# Patient Record
Sex: Male | Born: 1945 | Race: White | Hispanic: No | Marital: Married | Smoking: Current every day smoker
Health system: Southern US, Community
[De-identification: ages and names within clinical notes are randomized; demographics above are authoritative.]

## PROBLEM LIST (undated history)

## (undated) DIAGNOSIS — R4589 Other symptoms and signs involving emotional state: Secondary | ICD-10-CM

## (undated) DIAGNOSIS — M1990 Unspecified osteoarthritis, unspecified site: Secondary | ICD-10-CM

## (undated) DIAGNOSIS — N400 Enlarged prostate without lower urinary tract symptoms: Secondary | ICD-10-CM

## (undated) DIAGNOSIS — J449 Chronic obstructive pulmonary disease, unspecified: Secondary | ICD-10-CM

## (undated) DIAGNOSIS — D369 Benign neoplasm, unspecified site: Secondary | ICD-10-CM

## (undated) DIAGNOSIS — K5792 Diverticulitis of intestine, part unspecified, without perforation or abscess without bleeding: Secondary | ICD-10-CM

## (undated) DIAGNOSIS — J189 Pneumonia, unspecified organism: Secondary | ICD-10-CM

## (undated) DIAGNOSIS — A419 Sepsis, unspecified organism: Secondary | ICD-10-CM

## (undated) DIAGNOSIS — R0600 Dyspnea, unspecified: Secondary | ICD-10-CM

## (undated) DIAGNOSIS — R51 Headache: Secondary | ICD-10-CM

## (undated) DIAGNOSIS — I739 Peripheral vascular disease, unspecified: Secondary | ICD-10-CM

## (undated) DIAGNOSIS — G510 Bell's palsy: Secondary | ICD-10-CM

## (undated) DIAGNOSIS — C801 Malignant (primary) neoplasm, unspecified: Secondary | ICD-10-CM

## (undated) DIAGNOSIS — I10 Essential (primary) hypertension: Secondary | ICD-10-CM

## (undated) DIAGNOSIS — M25559 Pain in unspecified hip: Secondary | ICD-10-CM

## (undated) HISTORY — DX: Peripheral vascular disease, unspecified: I73.9

## (undated) HISTORY — PX: VASECTOMY: SHX75

## (undated) HISTORY — PX: SPINE SURGERY: SHX786

## (undated) HISTORY — DX: Unspecified osteoarthritis, unspecified site: M19.90

## (undated) HISTORY — DX: Malignant (primary) neoplasm, unspecified: C80.1

## (undated) HISTORY — PX: ROTATOR CUFF REPAIR: SHX139

## (undated) HISTORY — DX: Benign neoplasm, unspecified site: D36.9

---

## 1998-03-18 ENCOUNTER — Inpatient Hospital Stay (HOSPITAL_COMMUNITY): Admission: RE | Admit: 1998-03-18 | Discharge: 1998-03-20 | Payer: Self-pay | Admitting: Neurosurgery

## 1998-09-27 ENCOUNTER — Emergency Department (HOSPITAL_COMMUNITY): Admission: EM | Admit: 1998-09-27 | Discharge: 1998-09-27 | Payer: Self-pay | Admitting: Emergency Medicine

## 1998-10-04 ENCOUNTER — Ambulatory Visit (HOSPITAL_COMMUNITY): Admission: RE | Admit: 1998-10-04 | Discharge: 1998-10-04 | Payer: Self-pay | Admitting: Neurosurgery

## 1998-11-04 ENCOUNTER — Inpatient Hospital Stay (HOSPITAL_COMMUNITY): Admission: RE | Admit: 1998-11-04 | Discharge: 1998-11-05 | Payer: Self-pay | Admitting: Neurosurgery

## 1999-01-19 ENCOUNTER — Ambulatory Visit (HOSPITAL_COMMUNITY): Admission: RE | Admit: 1999-01-19 | Discharge: 1999-01-19 | Payer: Self-pay | Admitting: Neurosurgery

## 1999-04-01 ENCOUNTER — Ambulatory Visit (HOSPITAL_BASED_OUTPATIENT_CLINIC_OR_DEPARTMENT_OTHER): Admission: RE | Admit: 1999-04-01 | Discharge: 1999-04-01 | Payer: Self-pay | Admitting: Orthopedic Surgery

## 1999-05-27 ENCOUNTER — Encounter: Admission: RE | Admit: 1999-05-27 | Discharge: 1999-05-27 | Payer: Self-pay | Admitting: Neurosurgery

## 1999-07-29 ENCOUNTER — Encounter: Admission: RE | Admit: 1999-07-29 | Discharge: 1999-07-29 | Payer: Self-pay | Admitting: Neurosurgery

## 1999-11-23 ENCOUNTER — Ambulatory Visit (HOSPITAL_COMMUNITY): Admission: RE | Admit: 1999-11-23 | Discharge: 1999-11-23 | Payer: Self-pay | Admitting: Neurosurgery

## 1999-12-24 ENCOUNTER — Inpatient Hospital Stay (HOSPITAL_COMMUNITY): Admission: RE | Admit: 1999-12-24 | Discharge: 1999-12-30 | Payer: Self-pay | Admitting: Neurosurgery

## 2000-02-03 ENCOUNTER — Encounter: Admission: RE | Admit: 2000-02-03 | Discharge: 2000-02-03 | Payer: Self-pay | Admitting: Neurosurgery

## 2000-02-06 ENCOUNTER — Ambulatory Visit: Admission: RE | Admit: 2000-02-06 | Discharge: 2000-02-06 | Payer: Self-pay | Admitting: Neurosurgery

## 2000-04-07 ENCOUNTER — Encounter: Admission: RE | Admit: 2000-04-07 | Discharge: 2000-04-07 | Payer: Self-pay | Admitting: Neurosurgery

## 2000-07-06 ENCOUNTER — Encounter: Admission: RE | Admit: 2000-07-06 | Discharge: 2000-07-06 | Payer: Self-pay | Admitting: Neurosurgery

## 2000-07-30 ENCOUNTER — Ambulatory Visit (HOSPITAL_COMMUNITY): Admission: RE | Admit: 2000-07-30 | Discharge: 2000-07-30 | Payer: Self-pay | Admitting: Internal Medicine

## 2001-01-11 ENCOUNTER — Encounter: Admission: RE | Admit: 2001-01-11 | Discharge: 2001-01-11 | Payer: Self-pay | Admitting: Neurosurgery

## 2001-06-23 ENCOUNTER — Ambulatory Visit (HOSPITAL_COMMUNITY): Admission: RE | Admit: 2001-06-23 | Discharge: 2001-06-23 | Payer: Self-pay | Admitting: Neurosurgery

## 2001-10-05 ENCOUNTER — Encounter: Payer: Self-pay | Admitting: Urology

## 2001-10-05 ENCOUNTER — Ambulatory Visit (HOSPITAL_COMMUNITY): Admission: RE | Admit: 2001-10-05 | Discharge: 2001-10-05 | Payer: Self-pay | Admitting: Urology

## 2001-11-03 ENCOUNTER — Ambulatory Visit (HOSPITAL_COMMUNITY): Admission: RE | Admit: 2001-11-03 | Discharge: 2001-11-03 | Payer: Self-pay | Admitting: Urology

## 2001-11-03 ENCOUNTER — Encounter: Payer: Self-pay | Admitting: Urology

## 2002-01-26 ENCOUNTER — Observation Stay (HOSPITAL_COMMUNITY): Admission: RE | Admit: 2002-01-26 | Discharge: 2002-01-27 | Payer: Self-pay | Admitting: Neurosurgery

## 2002-03-27 ENCOUNTER — Ambulatory Visit (HOSPITAL_BASED_OUTPATIENT_CLINIC_OR_DEPARTMENT_OTHER): Admission: RE | Admit: 2002-03-27 | Discharge: 2002-03-27 | Payer: Self-pay | Admitting: Orthopedic Surgery

## 2002-03-30 ENCOUNTER — Encounter (HOSPITAL_COMMUNITY): Admission: RE | Admit: 2002-03-30 | Discharge: 2002-04-29 | Payer: Self-pay | Admitting: Orthopedic Surgery

## 2002-05-08 ENCOUNTER — Encounter (HOSPITAL_COMMUNITY): Admission: RE | Admit: 2002-05-08 | Discharge: 2002-06-07 | Payer: Self-pay | Admitting: Orthopedic Surgery

## 2002-09-24 ENCOUNTER — Ambulatory Visit (HOSPITAL_COMMUNITY): Admission: RE | Admit: 2002-09-24 | Discharge: 2002-09-24 | Payer: Self-pay | Admitting: Neurosurgery

## 2003-02-02 ENCOUNTER — Encounter: Payer: Self-pay | Admitting: Family Medicine

## 2003-02-02 ENCOUNTER — Ambulatory Visit (HOSPITAL_COMMUNITY): Admission: RE | Admit: 2003-02-02 | Discharge: 2003-02-02 | Payer: Self-pay | Admitting: Family Medicine

## 2003-03-22 ENCOUNTER — Ambulatory Visit (HOSPITAL_COMMUNITY): Admission: RE | Admit: 2003-03-22 | Discharge: 2003-03-22 | Payer: Self-pay | Admitting: Neurosurgery

## 2003-06-26 ENCOUNTER — Ambulatory Visit (HOSPITAL_COMMUNITY): Admission: RE | Admit: 2003-06-26 | Discharge: 2003-06-26 | Payer: Self-pay | Admitting: Neurosurgery

## 2003-08-03 ENCOUNTER — Ambulatory Visit (HOSPITAL_COMMUNITY): Admission: RE | Admit: 2003-08-03 | Discharge: 2003-08-03 | Payer: Self-pay | Admitting: Neurosurgery

## 2003-09-21 ENCOUNTER — Encounter
Admission: RE | Admit: 2003-09-21 | Discharge: 2003-12-20 | Payer: Self-pay | Admitting: Physical Medicine & Rehabilitation

## 2003-09-24 ENCOUNTER — Ambulatory Visit (HOSPITAL_COMMUNITY): Admission: RE | Admit: 2003-09-24 | Discharge: 2003-09-24 | Payer: Self-pay | Admitting: Urology

## 2004-04-20 HISTORY — PX: ILIAC ARTERY STENT: SHX1786

## 2004-08-21 ENCOUNTER — Ambulatory Visit: Payer: Self-pay | Admitting: Orthopedic Surgery

## 2004-08-22 ENCOUNTER — Ambulatory Visit (HOSPITAL_COMMUNITY): Admission: RE | Admit: 2004-08-22 | Discharge: 2004-08-22 | Payer: Self-pay | Admitting: Orthopedic Surgery

## 2004-10-01 ENCOUNTER — Ambulatory Visit (HOSPITAL_COMMUNITY): Admission: RE | Admit: 2004-10-01 | Discharge: 2004-10-01 | Payer: Self-pay | Admitting: Vascular Surgery

## 2004-12-17 ENCOUNTER — Ambulatory Visit (HOSPITAL_COMMUNITY): Admission: RE | Admit: 2004-12-17 | Discharge: 2004-12-17 | Payer: Self-pay | Admitting: General Surgery

## 2005-02-12 ENCOUNTER — Inpatient Hospital Stay (HOSPITAL_COMMUNITY): Admission: RE | Admit: 2005-02-12 | Discharge: 2005-02-15 | Payer: Self-pay | Admitting: Neurosurgery

## 2005-07-30 ENCOUNTER — Ambulatory Visit (HOSPITAL_COMMUNITY): Admission: RE | Admit: 2005-07-30 | Discharge: 2005-07-30 | Payer: Self-pay | Admitting: Internal Medicine

## 2005-07-30 ENCOUNTER — Ambulatory Visit: Payer: Self-pay | Admitting: Internal Medicine

## 2005-07-30 HISTORY — PX: COLONOSCOPY: SHX174

## 2006-05-12 ENCOUNTER — Emergency Department (HOSPITAL_COMMUNITY): Admission: EM | Admit: 2006-05-12 | Discharge: 2006-05-12 | Payer: Self-pay | Admitting: Emergency Medicine

## 2006-08-24 ENCOUNTER — Ambulatory Visit (HOSPITAL_COMMUNITY): Admission: RE | Admit: 2006-08-24 | Discharge: 2006-08-24 | Payer: Self-pay | Admitting: Family Medicine

## 2006-12-28 ENCOUNTER — Ambulatory Visit (HOSPITAL_COMMUNITY): Admission: RE | Admit: 2006-12-28 | Discharge: 2006-12-28 | Payer: Self-pay | Admitting: Neurosurgery

## 2007-04-18 ENCOUNTER — Inpatient Hospital Stay (HOSPITAL_COMMUNITY): Admission: RE | Admit: 2007-04-18 | Discharge: 2007-04-21 | Payer: Self-pay | Admitting: Neurosurgery

## 2007-05-18 ENCOUNTER — Ambulatory Visit (HOSPITAL_COMMUNITY): Admission: RE | Admit: 2007-05-18 | Discharge: 2007-05-18 | Payer: Self-pay | Admitting: Family Medicine

## 2007-06-02 ENCOUNTER — Ambulatory Visit (HOSPITAL_COMMUNITY): Admission: RE | Admit: 2007-06-02 | Discharge: 2007-06-02 | Payer: Self-pay | Admitting: Family Medicine

## 2007-06-14 ENCOUNTER — Encounter (INDEPENDENT_AMBULATORY_CARE_PROVIDER_SITE_OTHER): Payer: Self-pay | Admitting: Diagnostic Radiology

## 2007-06-14 ENCOUNTER — Ambulatory Visit (HOSPITAL_COMMUNITY): Admission: RE | Admit: 2007-06-14 | Discharge: 2007-06-14 | Payer: Self-pay | Admitting: Family Medicine

## 2007-11-09 ENCOUNTER — Ambulatory Visit: Payer: Self-pay | Admitting: Vascular Surgery

## 2007-11-21 ENCOUNTER — Ambulatory Visit (HOSPITAL_COMMUNITY): Admission: RE | Admit: 2007-11-21 | Discharge: 2007-11-21 | Payer: Self-pay | Admitting: Neurosurgery

## 2007-12-15 ENCOUNTER — Encounter: Admission: RE | Admit: 2007-12-15 | Discharge: 2007-12-15 | Payer: Self-pay | Admitting: Orthopedic Surgery

## 2008-04-09 IMAGING — CT CT ANGIO CHEST
1 of 2 series · 19 of 32 positions shown · IV contrast (Omnipaque 300)
Comparison: Chest x-ray 05/12/06.

CLINICAL DATA: Back surgery March 2007 with elevated D-dimer.
CT ANGIOGRAPHY OF CHEST:
TECHNIQUE: Multidetector CT imaging of the chest was performed during bolus injection of intravenous contrast.  Multiplanar CT angiographic image reconstructions were generated to evaluate the vascular anatomy.
Contrast:  80 cc Omnipaque 300.

[Series 12: thin pacs · axial · 0.71mm/px · z∈[-306,-44]mm · 19 of 293 slices shown]
[im 15/293  lung]
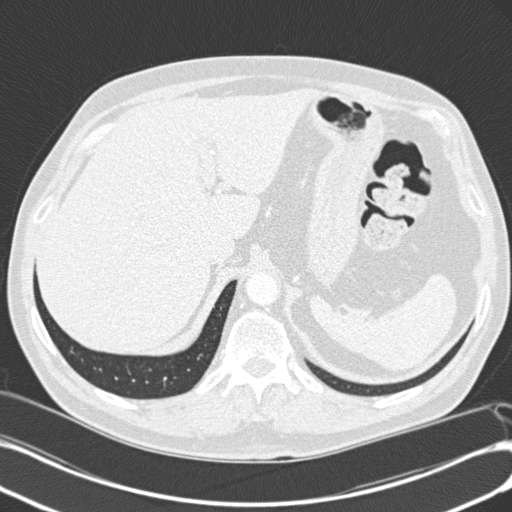
[im 30/293  mediastinal]
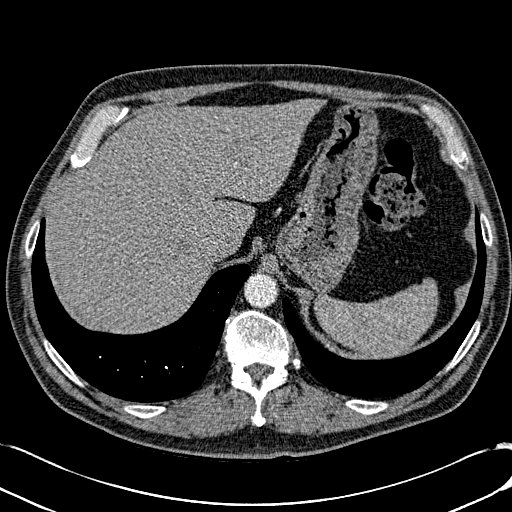
[im 44/293  lung]
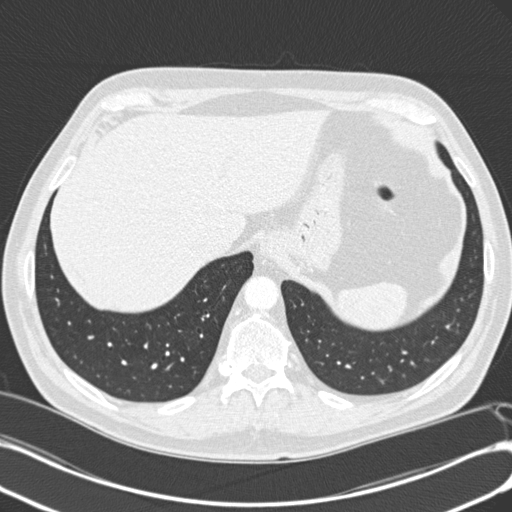
[im 74/293  mediastinal]
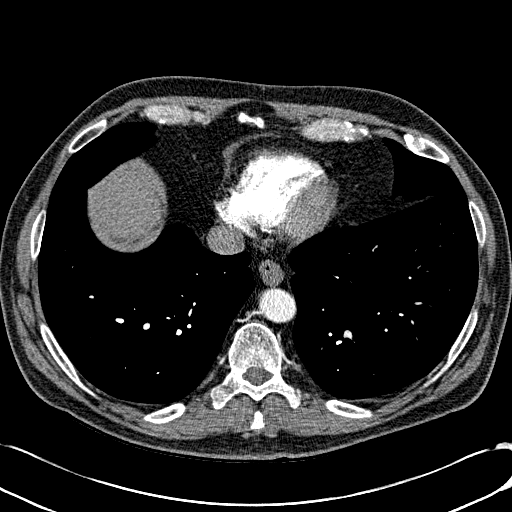
[im 88/293  lung]
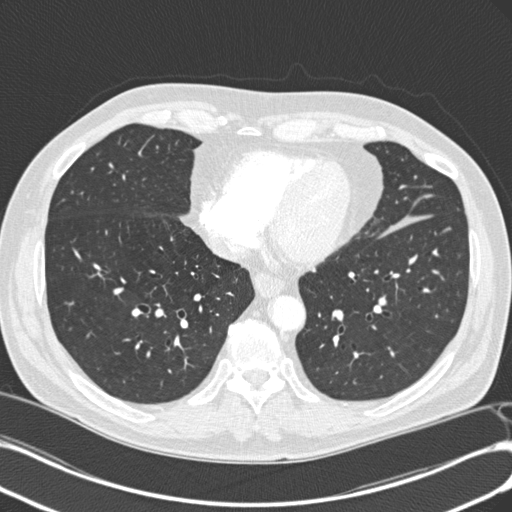
[im 98/293  mediastinal]
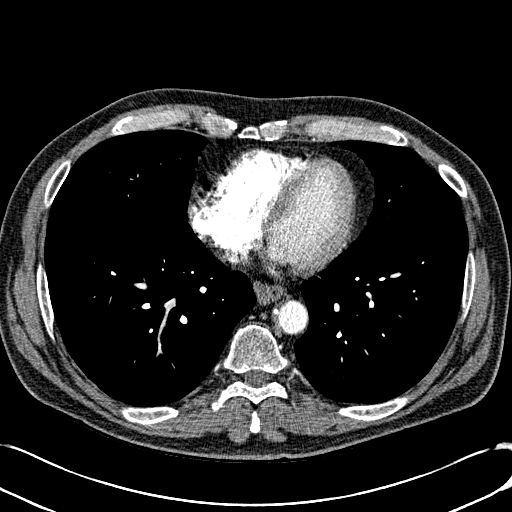
[im 103/293  lung]
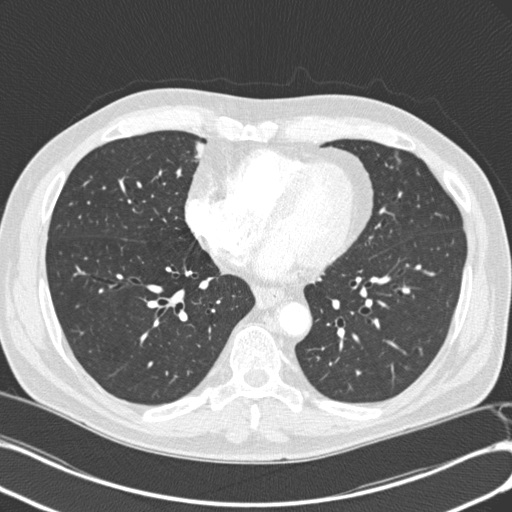
[im 117/293  mediastinal]
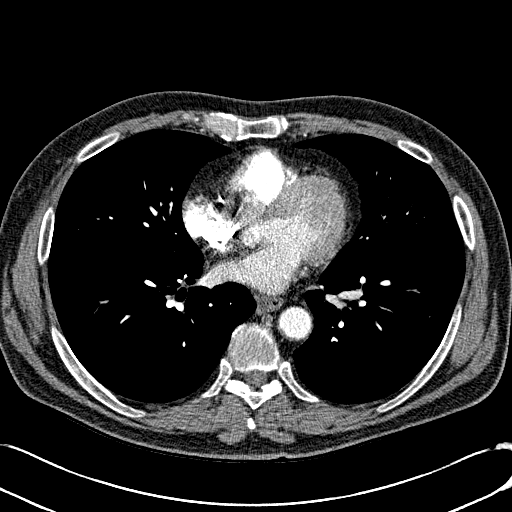
[im 132/293  lung]
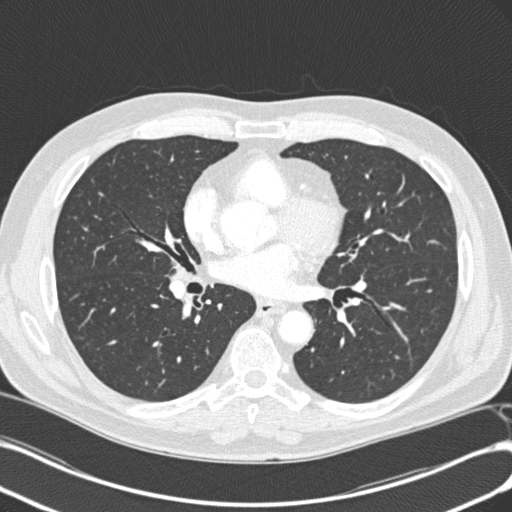
[im 147/293  mediastinal]
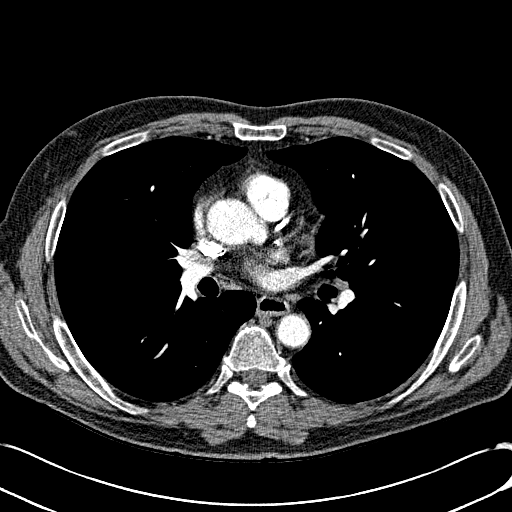
[im 161/293  lung]
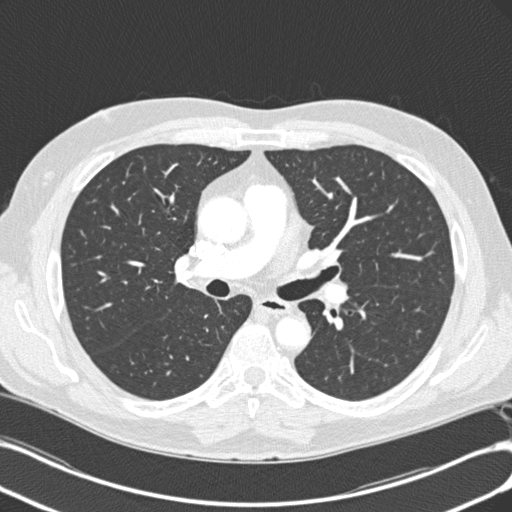
[im 176/293  mediastinal]
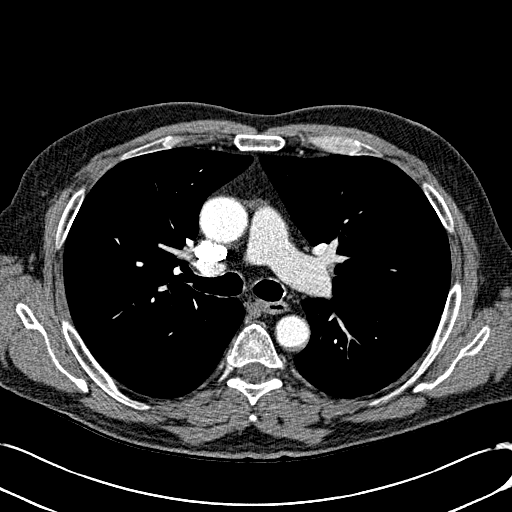
[im 190/293  lung]
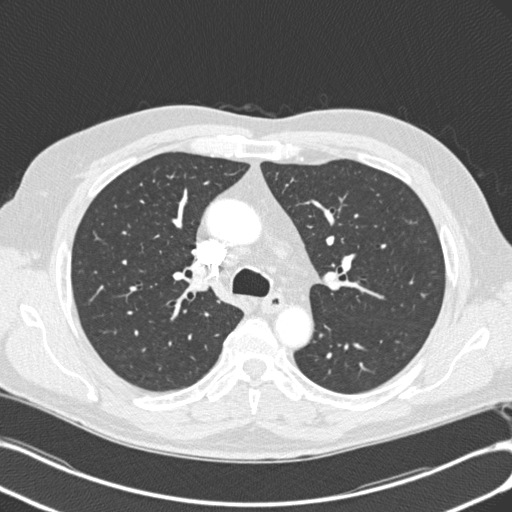
[im 195/293  mediastinal]
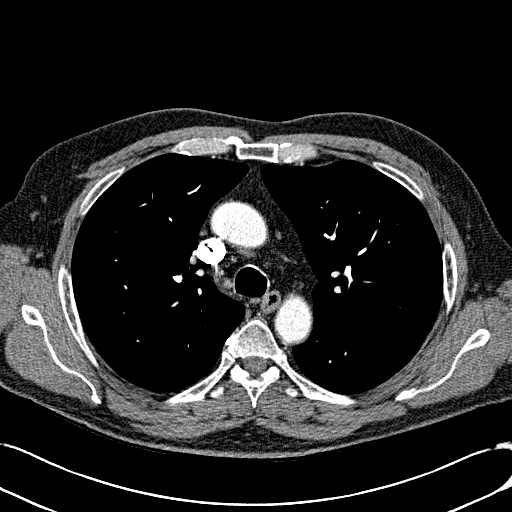
[im 205/293  lung]
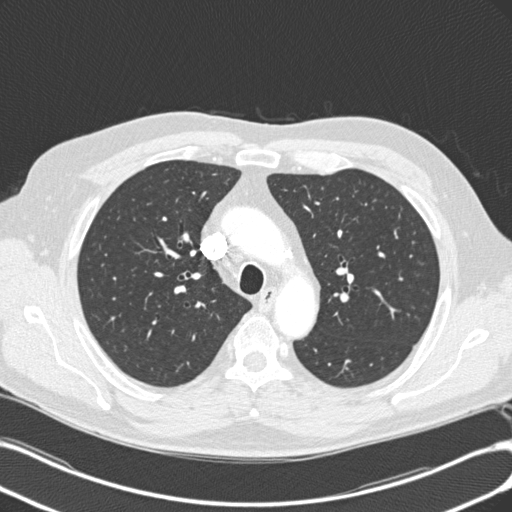
[im 220/293  mediastinal]
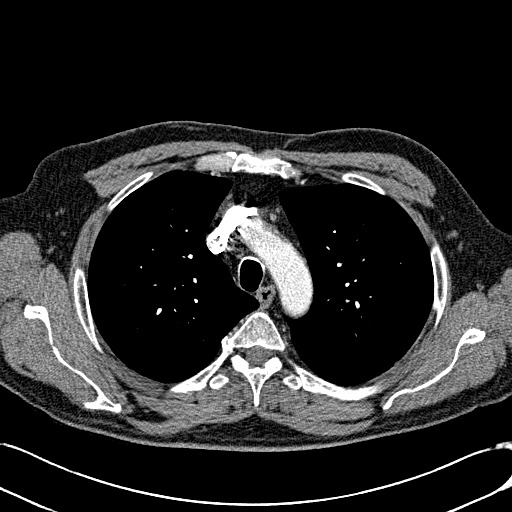
[im 249/293  lung]
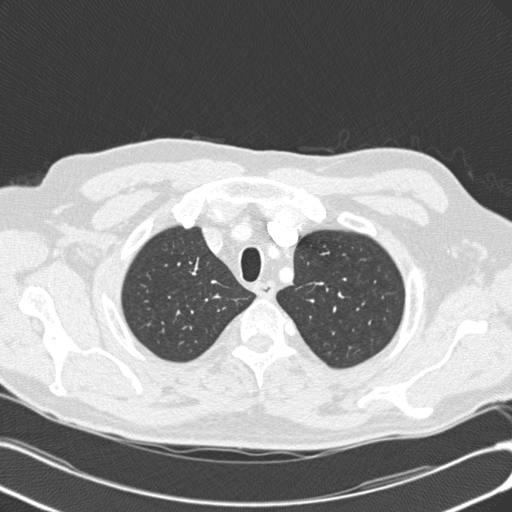
[im 263/293  mediastinal]
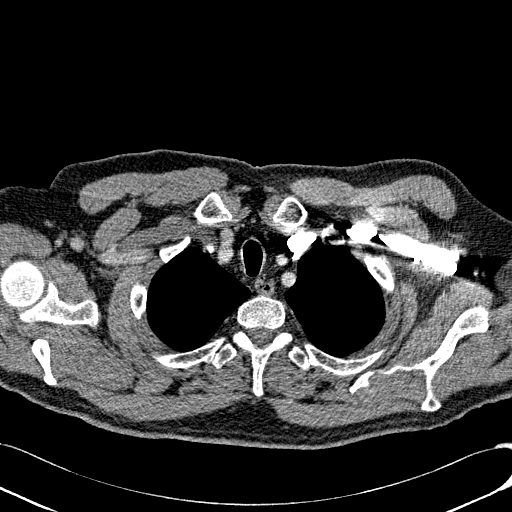
[im 278/293  lung]
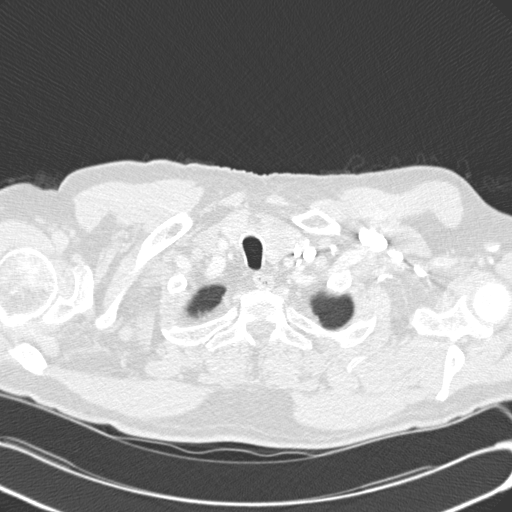

[19 of 32 positions shown; findings below may reference images not displayed]

FINDINGS: No pulmonary embolus.  The thyroid is asymmetrically enlarged and contains a probable nodule measuring approximately 2.2 x 1.4 cm.  No pathologically enlarged mediastinal, hilar, or axillary lymph nodes.  Heart size normal.  Coronary artery calcification appears age-advanced.  No pericardial effusion.  Small hiatal hernia.  
Lungs are clear.  No pleural fluid.  Airway unremarkable.
Incidental imaging of the upper abdomen shows no acute findings.  No worrisome lytic or sclerotic lesions.
IMPRESSION: 1.  No pulmonary embolus.
2.  Asymmetrically enlarged left lobe of thyroid with probable underlying nodule.  Ultrasound is recommended.
3.  Coronary artery calcification appears age-advanced.

## 2008-05-04 ENCOUNTER — Encounter: Admission: RE | Admit: 2008-05-04 | Discharge: 2008-05-04 | Payer: Self-pay | Admitting: Neurosurgery

## 2009-01-18 ENCOUNTER — Ambulatory Visit (HOSPITAL_COMMUNITY): Admission: RE | Admit: 2009-01-18 | Discharge: 2009-01-18 | Payer: Self-pay | Admitting: Neurosurgery

## 2009-01-28 ENCOUNTER — Ambulatory Visit (HOSPITAL_COMMUNITY): Admission: RE | Admit: 2009-01-28 | Discharge: 2009-01-28 | Payer: Self-pay | Admitting: Urology

## 2009-05-31 ENCOUNTER — Ambulatory Visit (HOSPITAL_COMMUNITY): Admission: RE | Admit: 2009-05-31 | Discharge: 2009-05-31 | Payer: Self-pay | Admitting: Family Medicine

## 2009-06-21 ENCOUNTER — Ambulatory Visit: Payer: Self-pay | Admitting: Vascular Surgery

## 2009-07-04 ENCOUNTER — Ambulatory Visit (HOSPITAL_COMMUNITY): Admission: RE | Admit: 2009-07-04 | Discharge: 2009-07-04 | Payer: Self-pay | Admitting: Family Medicine

## 2009-07-12 ENCOUNTER — Ambulatory Visit (HOSPITAL_COMMUNITY): Admission: RE | Admit: 2009-07-12 | Discharge: 2009-07-12 | Payer: Self-pay | Admitting: Family Medicine

## 2009-10-17 ENCOUNTER — Encounter: Admission: RE | Admit: 2009-10-17 | Discharge: 2009-10-17 | Payer: Self-pay | Admitting: Neurosurgery

## 2009-11-07 ENCOUNTER — Ambulatory Visit (HOSPITAL_COMMUNITY): Admission: RE | Admit: 2009-11-07 | Discharge: 2009-11-07 | Payer: Self-pay | Admitting: Internal Medicine

## 2009-11-28 ENCOUNTER — Inpatient Hospital Stay (HOSPITAL_COMMUNITY): Admission: RE | Admit: 2009-11-28 | Discharge: 2009-11-29 | Payer: Self-pay | Admitting: Neurosurgery

## 2010-03-10 ENCOUNTER — Encounter
Admission: RE | Admit: 2010-03-10 | Discharge: 2010-04-15 | Payer: Self-pay | Source: Home / Self Care | Attending: Physical Medicine & Rehabilitation | Admitting: Physical Medicine & Rehabilitation

## 2010-03-18 ENCOUNTER — Ambulatory Visit: Payer: Self-pay | Admitting: Physical Medicine & Rehabilitation

## 2010-04-21 ENCOUNTER — Ambulatory Visit: Payer: Self-pay | Admitting: Physical Medicine & Rehabilitation

## 2010-07-01 ENCOUNTER — Ambulatory Visit (INDEPENDENT_AMBULATORY_CARE_PROVIDER_SITE_OTHER): Payer: Medicare Other | Admitting: Vascular Surgery

## 2010-07-01 ENCOUNTER — Encounter (INDEPENDENT_AMBULATORY_CARE_PROVIDER_SITE_OTHER): Payer: Medicare Other

## 2010-07-01 DIAGNOSIS — I7389 Other specified peripheral vascular diseases: Secondary | ICD-10-CM

## 2010-07-01 DIAGNOSIS — I723 Aneurysm of iliac artery: Secondary | ICD-10-CM

## 2010-07-01 NOTE — Assessment & Plan Note (Signed)
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  He has done well with no evidence of recurrent stenosis.  He had undergone a CT scan approximately 6-8 months ago showing some ectasia and is here for follow-up.  He reports continued difficulty with extreme multiple level low back problems, does have some aching sensation and pain with walking but this is not a typical claudication.  PHYSICAL EXAMINATION:  A well-developed and well-nourished white male in no acute distress.  Blood pressure is 130/83, pulse 91, respirations 20. HEENT is normal.  His musculoskeletal shows no major deformities or cyanosis.  He does have palpable posterior tibial pulses bilaterally. Abdominal exam is benign with no pulsation or masses noted.  He underwent a repeat duplex today, and this shows no change in his iliac with a distal maximal measurement of 1.8 cm.  Right is 1.4.  I reassured patient with this.  I do not see any evidence of arterial insufficiency or aneurysms.  We will see him again in 1 year with repeat ultrasound to rule out any progression and enlargement of his iliac system.    Larina Earthly, M.D. Electronically Signed  TFE/MEDQ  D:  07/01/2010  T:  07/01/2010  Job:  [id number]

## 2010-07-04 LAB — CBC
HCT: 45 % (ref 39.0–52.0)
MCH: 30.6 pg (ref 26.0–34.0)
MCHC: 31.8 g/dL (ref 30.0–36.0)
Platelets: 298 10*3/uL (ref 150–400)
RBC: 4.67 MIL/uL (ref 4.22–5.81)
WBC: 10 10*3/uL (ref 4.0–10.5)

## 2010-07-04 LAB — BASIC METABOLIC PANEL
CO2: 29 mEq/L (ref 19–32)
Calcium: 9.2 mg/dL (ref 8.4–10.5)
Creatinine, Ser: 0.79 mg/dL (ref 0.4–1.5)
GFR calc Af Amer: >60 mL/min (ref >60)
GFR calc non Af Amer: >60 mL/min (ref >60)
Potassium: 4.1 mEq/L (ref 3.5–5.1)
Sodium: 138 mEq/L (ref 135–145)

## 2010-07-04 LAB — SURGICAL PCR SCREEN: MRSA, PCR: NEGATIVE

## 2010-07-08 NOTE — Procedures (Unsigned)
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  HISTORY: Diabetes:  No. Cardiac:  No. Hypertension:  No. Smoking:  Yes. Previous Surgery:  Left CIA stent 2006              SINGLE LEVEL ARTERIAL EXAM                             RIGHT                  LEFT Brachial: Anterior tibial: Posterior tibial: Peroneal: Ankle/brachial index: Previous ABI/date:  AORTA-ILIAC DUPLEX EXAM Aorta - Proximal     87 cm/s, 1.89 cm Aorta - Mid          74 cm/s, 1.34 cm Aorta - Distal       76 cm/s, 1.32 cm  RIGHT                                   LEFT 144 cm/s, 0.93 cm CIA-PROXIMAL          166 cm/s (stent), 0.95 cm 94 cm/s, 1.4 cm   CIA-DISTAL            262 cm/s (stent), 1.8 cm NV                HYPOGASTRIC           NV 116 cm/s          EIA-PROXIMAL          217 cm/s 118 cm/s, 0.71 cm EIA-MID               130 cm/s 100 cm/s          EIA-DISTAL            104 cm/s  IMPRESSION: 1. Elevated velocities observed in the left common iliac artery stent     without evidence of stenosis within the stent. 2. 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  ___________________________________________ Larina Earthly, M.D.  LT/MEDQ  D:  07/01/2010  T:  07/01/2010  Job:  [id number]

## 2010-07-13 LAB — CREATININE, SERUM: GFR calc Af Amer: >60 mL/min (ref >60)

## 2010-09-02 NOTE — Op Note (Signed)
NAMEDONSHAY, LUPINSKI               ACCOUNT NO.:  [id number]   MEDICAL RECORD NO.:  [id number]          PATIENT TYPE:  INP   LOCATION:  3007                         FACILITY:  [facility]   PHYSICIAN:  Cristi Loron, M.D.DATE OF BIRTH:  Jan 27, 1946   DATE OF PROCEDURE:  04/18/2007  DATE OF DISCHARGE:                               OPERATIVE REPORT   BRIEF HISTORY:  The patient is a 65 year old white male who has  undergone several previous lumbar spine surgeries including a most  recent lumbar fusion from L3-L4 down to L5-S1.  He has developed  persistent back and right hip and leg pain.  He has failed medical  management, was worked up with a lumbar MRI which demonstrated the  patient developed degenerative changes and stenosis at L2-L3.  He failed  extensive nonsurgical management.  I discussed the various treatment  options with the patient including extending his fusion at L2-L3.  The  patient has weighed the risks, benefits, and alternatives of the surgery  and decided to proceed with the above-mentioned surgery.  I also  recommended that he undergo exploration of this fusion at L3-L4 to rule  out pseudoarthrosis.   PREOPERATIVE DIAGNOSES:  L2-L3 degenerative disc disease, stenosis,  facet arthropathy, lumbar radiculopathy, and lumbago.   POSTOPERATIVE DIAGNOSES:  L2-L3 degenerative disc disease, stenosis,  facet arthropathy, lumbar radiculopathy, and lumbago.   PROCEDURE:  Exploration of arthrodesis; L2-L3 transforaminal lumbar  interbody fusion; insertion of L2-L3 interbody prosthesis (Capstone PEEK  cage); L2-L3 posterolateral arthrodesis with local morselized autograft  bone and Actifuse bone graft extender; posterior segmental  instrumentation from L2 to S1 and SD-90 titanium pedicle screws and  rods; decompressive laminectomy at L3 with bilateral L2 laminotomies to  decompress the bilateral L2, L3, and L4 nerve roots.   SURGEON:  Cristi Loron, MD   ASSISTANT:  Coletta Memos, MD   ANESTHESIA:  General endotracheal.   ESTIMATED BLOOD LOSS:  200 mL.   SPECIMENS:  None.   DRAINS:  None.   COMPLICATIONS:  None.   DESCRIPTION OF PROCEDURE:  The patient was brought to the operating room  by the anesthesia team.  General endotracheal anesthesia was induced.  The patient was carefully turned prone on the Wilson frame.  The  lumbosacral region was then shaved and prepared with Betadine scrub and  Betadine solution.  Sterile drapes were applied.  I then injected the  area to be incised with Marcaine with epinephrine solution.  I used a  scalpel to make a linear midline incision through the patient's previous  surgical scar.  I used electrocautery to perform a bilateral  subperiosteal dissection exposing the spinous process lamina of L2 and  the remainder of L3 lamina as well exposing the previous hardware.  I  used [employer] worker, community for exposure.  We then removed  the caps from the SD-90 screws as well as cross connector and removed  the rods.  We explored the arthrodesis at L3-L4.  We attempted to  independently move the L3 and L4 pedicle screws, but there would appear  to be no motion  i.e., in fact, there was a good arthrodesis at L3-L4.   Having completed the exploration of the arthrodesis, we now turned  attention to the decompression.  We used Leksell rongeur to remove the  remainder of the L3 spinous process lamina.  I then used a high-speed  drill to perform bilateral L2 laminotomies and then completed the L3  laminectomy and widened the L2 laminotomies and removed the L2-L3  ligamentum flavum, and this allowed [place] to perform foraminotomy about the  bilateral L2, L3, and L4 nerve roots.  This was necessary secondary to  the stenosis and the epidural fibrosis at these levels, i.e., the degree  of decompression was in excess of what was required to simply do the  fusion.   Having completed the decompression, we now turned our  attention to  arthrodesis.  I incised the right L2-L3 intervertebral disc with a #15-  blade scalpel.  I performed a partial intervertebral discectomy with the  pituitary forceps and the curettes.  We then prepared the vertebral  endplates at L2-L3 by removing the soft tissues using the curettes.  We  used the trial spacers and determined to use a 14 x 26-mm Capstone PEEK  interbody prosthesis.  I filled ventrally in the interspace with local  autograft bone we obtained during the decompression, as well as Actifuse  bone graft extender.  We then inserted the 14 x 26 Capstone PEEK  interbody prosthesis into this space at L2-L3 on the right, of course  retracting the neural structures out of harm's way prior to placement of  the prosthesis.  I then used the tamps to turn the prosthesis laterally.  I then filled posteriorly the disc space with Actifuse and local  autograft bone completing the transforaminal lumbar interbody fusion at  L2-L3.   We now turned attention to the instrumentation.  Under fluoroscopic  guidance, I cannulated the bilateral L2 pedicles with the bone probes.  We tapped the pedicles with 6.25-mm tap and then inserted a 7.75 x 50-mm  pedicle screws bilaterally into the L2 pedicles under fluoroscopic  guidance.  We then cut the rods, cut the appropriate lordosis and  connected the pedicle screws unilaterally from L2 down to S1.  We then  secured the rods in place with the caps which were tightened  appropriately and then placed a cross connector completing the posterior  instrumentation.   We now turned attention to posterolateral arthrodesis.  We decorticated  the remainder of the left L2-L3 facet and pars region.  We then laid a  combination of Actifuse and local autograft bone over these decorticated  posterolateral structures completing the posterolateral arthrodesis.   We then inspected the thecal sac and the bilateral L2, L3, and L4 nerve  roots and noted they  were well decompressed.  We obtained hemostasis  using bipolar electrocautery, irrigated the wound out with bacitracin  solution, and then removed the retractors.  We then reapproximated the  patient's thoracolumbar fascia with interrupted #1 Vicryl suture,  subcutaneous tissue with interrupted 2-0 Vicryl suture, and the skin  with Steri-Strips and Benzoin.  The wound was then coated with  bacitracin ointment.  A sterile dressing was applied.  The drapes were  removed, and the patient was subsequently returned to the supine  position where he was extubated by the anesthesia team and transported  to the post-anesthesia care unit in a stable condition.  All sponge,  instrument, and needle counts were correct at the end of this case.  Cristi Loron, M.D.  Electronically Signed     JDJ/MEDQ  D:  04/18/2007  T:  04/19/2007  Job:  [id number]

## 2010-09-02 NOTE — Assessment & Plan Note (Signed)
OFFICE VISIT   Derek [name], Derek [name]  DOB:  07/31/1945                                       06/21/2009  CHART#:[id number]   The patient presents today for symptoms and for evaluation of his lower  extremity pain and also for evaluation of the finding of a left common  iliac artery small aneurysm.  He is known to me from a prior left common  iliac artery angioplasty by myself in 2006.  He had resolution of the  claudication symptoms but is now having bilateral lower extremity hip  and buttock discomfort.  He reports that this occurs with rest and with  exercise.  He reports weakness in his legs and reports that he actually  fell while coming off of a commode 2 weeks ago, and it was very  difficult for him to get back to his feet.  He does have disk difficulty  as well and also has severe right hip disease.  He had undergone a CT of  his pelvis and abdomen in October and this revealed an area of ectasia  and mild aneurysmal change in his left common iliac artery with a 2 cm  diameter aneurysm.  He has had multiple lumbar and several prior  cervical disk surgeries.  He also has had surgery on his shoulder.  He  is disabled.  Married.  He does smoke 2 packs of cigarettes per day.  He  does not drink alcohol on a regular basis.   REVIEW OF SYSTEMS:  Weight has no loss or gain up to 208 pounds.  He is  5 feet 10 inches tall.  He denies any cardiac difficulty or pulmonary  difficulty.  He is hypertensive.   PHYSICAL EXAM:  Well-nourished white male appearing stated age.  His  blood pressure is 145/85, pulse 101, respirations 18, temperature is  98.2.  General:  He is in no acute distress.  HEENT is normal.  Abdomen:  Soft, nontender.  Musculoskeletal:  No major deformities or cyanosis.  Neurologic is with no focal weakness or paresthesias.  Skin without  ulcers or rashes.  He does have 2+ radial and 2+ dorsalis pedis pulses  bilaterally.   He underwent noninvasive  vascular lab studies in our office which  reveals normal ankle-arm index with normal triphasic wave forms.  I have  reviewed his CT scan as well.  This does show some mild ectasia of his  iliac arteries.  I have discussed this at length with the patient and  have recommend that we see him for ultrasound of these areas to rule out  enlargement in approximately 1 year.  I do not see any contraindication  to any planned back surgery.  I do not see any evidence of arterial  insufficiency as a potential cause for his leg discomfort.     Larina Earthly, M.D.  Electronically Signed   TFE/MEDQ  D:  06/21/2009  T:  06/24/2009  Job:  [id number]

## 2010-09-02 NOTE — Discharge Summary (Signed)
Derek [name], Derek [name]               ACCOUNT NO.:  [id number]   MEDICAL RECORD NO.:  [id number]          PATIENT TYPE:  INP   LOCATION:  3007                         FACILITY:  [facility]   PHYSICIAN:  Cristi Loron, M.D.DATE OF BIRTH:  1946-01-12   DATE OF ADMISSION:  04/18/2007  DATE OF DISCHARGE:  04/21/2007                               DISCHARGE SUMMARY   BRIEF HISTORY:  The patient is a 65 year old white male who has had  chronic back trouble.  He has had other surgeries.  He has developed ST  segment stenosis and recurrent pain at L2-3.  I discussed the various  treatment options with him including surgery.  He decided to proceed  with the operation after weighing the risks, benefits, and alternatives.   For further details of this admission, please refer to typed admission  note.   HOSPITAL COURSE:  I admitted the patient to [facility] on  April 18, 2007.  On the day of admission, I performed an  instrumentation and fusion at the L2-3.  The surgery went well (for full  details of this operation, please refer to typed operative note).   POSTOPERATIVE COURSE:  The patient's postoperative course was  unremarkable.  By postop day #3, the patient was afebrile and once the  patient was stable, he was progressed to be discharged home.   The patient was discharged to home on April 21, 2007.   DISCHARGE INSTRUCTIONS:  The patient was given written discharge  instructions.  All of his questions were answered.   DISCHARGE PRESCRIPTIONS:  1. Percocet 10/325 #100 one p.o. q.4 h. p.r.n. for pain.  2. Valium 5 mg #50 one p.o. q.6 h. p.r.n. for muscle spasms.   FINAL DIAGNOSES:  1. L2-3 degenerative disk disease.  2. Stenosis.  3. Facet arthropathy.  4. Lumbago.  5. Lumbar radiculopathy.   PROCEDURE PERFORMED:  L2-3 transforaminal lumbar interbody fusion; L2-3  decompression with laminectomy; insertion of L2-3 interbody prosthesis  (Capstone PEEK interbody  prosthesis); posterior segmental  instrumentation L2-S1with Legacy titanium pedicle screws and rods; L2-3  posterolateral arthrodesis with local autograft bone and bone-graft  extender.      Cristi Loron, M.D.  Electronically Signed     JDJ/MEDQ  D:  05/12/2007  T:  05/12/2007  Job:  [id number]

## 2010-09-05 NOTE — Group Therapy Note (Signed)
MEDICAL RECORD NUMBER:  [id number]   REFERRING PHYSICIAN:  Cristi Loron, M.D.   HISTORY AND PURPOSE OF EVALUATION:  Evaluate and treat chronic low back and  upper back pain.   HISTORY OF PRESENT ILLNESS:  [name] is a 65 year old adult male  referred to this office by Dr. Lovell Sheehan for treatment of chronic low and  upper back pain.   The patient's medical history includes multiple operations of his neck and  low back and I will do my best to summarize those.   The patient reports that he initially had an MRI scan that showed a pinched  nerve in his neck.  That eventually led to a microdiskectomy with Dr.  Lovell Sheehan in approximately the summer of 2000.  He does report that helped  somewhat with his neck pain.  He subsequently developed low back and right  leg pain and an MRI scan of his lumbar spine was done.   The MRI scan of his lumbar spine reportedly showed degenerative disk disease  with a herniated nucleus pulposus at L4-5 and L5-S1.  The patient underwent  an initial lumbar partial diskectomy with Dr. Lovell Sheehan, but I am not sure of  that initial lumbar surgery.   Subsequent to this the patient reports that he gained some relief, but still  had persistent lumbar pain.   He underwent a second lumbar surgery, including L4-5, L5-S1 interbody fusion  along with L5-S1 redo microdiskectomy, again performed by Dr. Lovell Sheehan and  this was done on December 24, 1999.  He reports that his lumbar surgeries  helped somewhat for his low back pain.   The patient subsequently again developed increased neck pain and underwent  C4-5 anterior cervical fusion with removal of the C5-6 plate and this was  done for a pinched nerve on January 26, 2002.  The patient reports that his  neck and arm pain also improved at this time.   During all of this time, the patient also has undergone at least a total of  three shoulder surgeries with Dr. Thurston Hole.  Those have helped in addition to  his cervical  surgeries to help him with his upper extremity pain.   On September 24, 2002, an MRI scan of his lumbar spine showed status post lumbar  fusion at L4-5 and L5-S1 with no evidence of spinal stenosis or neural  foraminal narrowing.  At L3-4, there was a bulge with mild spinal stenosis  and mild bilateral neural foraminal narrowing.  There was an old anterior  wedge compression deformity at L1.  No new soft right-sided lateralizing  disk protrusion was found as a cause of the patient's symptoms.   On March 09, 2003, the patient saw Dr. Lovell Sheehan in followup.  At that  time, Dr. Lovell Sheehan noted that the patient had seen Dr. Marina Goodell, a pain  specialist.  There was some discussion about pain medicines, but apparently  the patient and Dr. Marina Goodell did not come to an agreement.  At that time, Dr.  Lovell Sheehan recommended a thoracic spine MRI scan to evaluate his complaint of  abdominal pain.  He also prescribed a Lidoderm patch.   On March 22, 2003, an MRI scan of his thoracic spine showed no herniated  nucleus pulposus or cord compression.   On June 06, 2003, the patient saw Dr. Lovell Sheehan with a complaint of right  hip discomfort.  At that time, an MRI scan of the hip was requested.   On June 26, 2003, an MRI scan of  the right hip was read as normal.   On August 03, 2003, the patient underwent a lumbar myelogram and post  myelogram CT.  The results of the myelogram showed status post L4-S1 fusion  without definite adverse features.  There was multifactorial stenosis at L3-  4 above the fusion along with an old L1 compression fracture.  The post  myelogram CT showed satisfactory appearance of the L4-S1 fusion with  subsegmental stenosis at L3-4 (first segment above the fusion) secondary to  post element hypertrophy and small central protrusion.   On August 08, 2003, the patient last saw Dr. Lovell Sheehan.  They reviewed the  myelogram and post myelogram CTs at that time.  Dr. Lovell Sheehan wished to see  the patient  in followup every few months to check on his progress.  No  further surgery was recommended, although it was still a possibility for the  future.   Presently the patient reports that he has pain across his low back into his  right leg and anteriorly down his right leg.  He describes the pain as being  sharp.  He also has complaints of pain between his shoulder blades  posteriorly and over his right anterior shoulder.  He is reporting that the  pain in his shoulder worsens with certain activities.  He does report that  the Neurontin medication taken 600 mg t.i.d. does have help.  He also takes  Lorcet 10/650 mg, approximately four per day, and that gives him good  relief.  He has used Percocet in the past and that apparently gave him  better relief.  The patient reportedly would like to have his pain medicine  through this office as his primary care physician is near retirement.   PAST MEDICAL HISTORY:  1. History of multiple shoulder surgeries, including rotator cuff surgery     with Dr. Thurston Hole.  2. Prior vasectomy.  3. Prior colon polyp resection of six polyps.  4. Lumbar and cervical surgeries as noted above.   FAMILY HISTORY:  His mother is alive with history of heart disease and  Alzheimer's disease.  His father is deceased with kidney failure.  He has a  brother who is alive with an abdominal aneurysm.   ALLERGIES:  1. CODEINE causes nausea.  2. CORTISONE caused increased shoulder pain when used in the past in his     right shoulder.   SOCIAL HISTORY:  The patient is married with two grown children.  He smokes  one packs of cigarettes per day and denies alcohol usage.  He is on  disability since 2003.  He previously worked as a Human resources officer.  He has an  eighth grade education.   PHYSICAL EXAMINATION:  A reasonably well-appearing, fit, adult male in  moderate acute discomfort.  The blood pressure was 129/82 with a pulse of 90, respiratory rate 14, and O2 saturation 99% on  room air.  Bilateral upper  extremity exam showed decreased range of motion of his shoulders  bilaterally.  He is unable to go in forward flexion or abduction past 90  degrees.  Strength was generally 4+/5 throughout the bilateral upper  extremities.  Reflexes were 2+ and symmetrical.  Sensation was intact to  light touch throughout the bilateral upper extremities.  Height was 5 feet  11 inches with a reported weight of 175 pounds.   The lower extremity exam shows 3+ to 4-/5 strength in hip flexion and knee  extension bilaterally.  Bulk and tone were normal.  Reflexes were 2+ at the  bilateral knees and ankles.  Sensation was intact to light touch throughout  his bilateral lower extremities.  The patient was able to ambulate without  any device and was able to toe and heel walk with moderate difficulty.   In the supine position, hip range of motion was only slightly decreased in  all planes.  Straight leg raise was negative to 45 degrees.   IMPRESSION:  Chronic low back and mid cervical pain with multiple surgeries  noted.   At the present time, the patient has had a history of at least two cervical  and two lumbar surgeries which have been helpful to some degree in relieving  his pain, although not completely effective.  He also has a history of at  least three shoulder surgeries, which again have been helpful to some  degree, although the range of motion is still abnormal on the bilateral  upper extremities.   At this time, the patient reports that he has been using Lorcet, but  actually gained better relief with Percocet in the past.  With that in mind,  we have started him on Percocet 10/325 mg one tablet p.o. t.i.d. p.r.n.  A  total of 90 were prescribed and that will be in place of his Lorcet.  I have  refilled his Neurontin 600 mg one tablet p.o. t.i.d.  We will see how he  responds to this and make adjustments as necessary.  He seems to understand  that pain management is an  attempt to manage his pain and we may not be  completely able to eliminate all pain that he is experiencing.  He also  needs modification in his activities so that he is not aggravating this  problem, but he is on disability at the present time and generally has the  ability to do what he wishes during the day.   We will plan on seeing the patient in followup in approximately one month's  time.     Ellwood Dense, M.D.   DC/MedQ  D:  09/21/2003 12:48:34  T:  09/21/2003 16:38:01  Job #:  [id number]

## 2010-09-05 NOTE — Op Note (Signed)
Derek [name], Derek [name]               ACCOUNT NO.:  [id number]   MEDICAL RECORD NO.:  [id number]          PATIENT TYPE:  OIB   LOCATION:  2899                         FACILITY:  [facility]   PHYSICIAN:  Larina Earthly, M.D.    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   OPERATION/PROCEDURE:  1.  Aortogram with bilateral extremity run-off.  2.  Left common iliac artery angioplasty and stenting with two 8 x 18      Genesis stents.  3.  StarClose left femoral puncture site closure.   SURGEON:  Larina Earthly, M.D.   ANESTHESIA:  1% lidocaine local.   COMPLICATIONS:  None.   DISPOSITION:  To the holding area stable.   DESCRIPTION OF PROCEDURE:  The patient was taken to the __________ cath lab,  placed in the supine position where the area of both groins were prepped and  draped in the usual sterile fashion.  Using local anesthesia and a single  wall puncture, the left common femoral artery was entered and the guidewire  was passed up to the level of the suprarenal aorta.  A 5-French sheath was  placed over the guidewire and a pigtail catheter was positioned at the level  of the suprarenal aorta.  An AP study was undertaken of the suprarenal aorta  and infrarenal aorta and the pelvis.  This revealed widely patent single  renal arteries bilaterally.  The patient had prior back hardware for  correction of degenerative disk disease.  Left and right oblique views were  undertaken for better visualization of this.  This revealed no evidence of  stenosis in the right iliac system.  There was a high grade 90% stenosis in  the left common iliac artery just distal to the bifurcation.  Decision was  made to proceed with angioplasty of this  region.  The 5-French sheath was  exchanged for a long 6-French sheath and this was positioned below the level  of the stenosis in the common iliac artery.  There was significant post  stenotic dilatation in this area.  The patient was given 4000 units of  intravenous heparin.   Next, an 8 mm balloon with a preloaded 18 mm Genesis stent was positioned.  The long sheath was positioned through the area of stenosis and the Genesis  stent was positioned at the level of the stenosis just distal to the take-  off of the aortic bifurcation.  This area was dilated and there was wasting  of the balloon.  On deflating the balloon and pulling the balloon back, the  stent pulled back into the post dilatation area of the common iliac artery.  For this reason, a 9 x 2 mm balloon was positioned in the stent and this was  partially inflated and the stent was pushed back up into the area of the  stenosis.  This was confirmed with fluoroscopy.  This was then redilated 9  cm.  This continued to have the balloon in the level of the common iliac  artery below the stenosis.  For this reason, a separate 8 mm balloon with an  18 mm stent was chosen and was placed at the level of the stenosis and was  inflated 14 mm of atmospheric pressure.  This gave excellent result.  The  area in the common iliac artery was post dilated to 9 mm in the first stent  that was deployed in the common iliac artery.  Repeat injection showed no  evidence of residual stenosis in the common iliac artery.  There was a plate  dissection plane in the area.  Finally a pigtail catheter was repositioned  above the bifurcation and repeat arteriogram showed that the stent was  positioned at the bifurcation with no impedance of flow in the right iliac  system.  Again there was no flow limitation and there was an area of  posterior medial dissection with no evidence of extravasation.  The patient  did not have any discomfort throughout the  procedure.  Finally, a long leg  run-off was obtained, again showing widely patent superficial femoral,  popliteal and tibial run-off bilaterally.  A 7-French sheath was exchanged  for a StarClose sheath.  The StarClose device was positioned through the  sheath, locked into place and the artery locator was employed.  The device  was withdrawn back until tension was made.  This was slightly relaced and  the StarClose device was deployed.  Manual pressure was held for five to 10  minutes and there was no evidence of  hematoma or bleeding from the closure  site.  The patient tolerated the procedure without any complications and was  transferred to the holding area in stable condition.   FINDINGS:  1.  Left common iliac artery stenosis.  2.  Widely patent right iliac.  3.  Genesis stenting of the left common iliac artery as described.  4.  StarClose closure of the left femoral puncture site.       TFE/MEDQ  D:  10/01/2004  T:  10/01/2004  Job:  [id number]

## 2010-09-05 NOTE — Op Note (Signed)
NAMEDWON, SKY               ACCOUNT NO.:  [id number]   MEDICAL RECORD NO.:  [id number]          PATIENT TYPE:  INP   LOCATION:  3010                         FACILITY:  [facility]   PHYSICIAN:  Cristi Loron, M.D.DATE OF BIRTH:  Apr 10, 1946   DATE OF PROCEDURE:  02/12/2005  DATE OF DISCHARGE:                                 OPERATIVE REPORT   BRIEF HISTORY:  The patient is a 65 year old white male who I performed a  previous L4-5 and 5-1 fusion on.  He has had persistent right back and right  hip pain.  He has failed extensive nonsurgical management.  He is worked up  with a lumbar MRI, which demonstrated he had stenosis at the L3-4, as well  as some degenerative changes.  I discussed the risks and treatment options  with him including L3-4 fusion.  The patient has weighed the risks,  benefits, and alternatives of surgery and decided to proceed with the L3-4  fusion.   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   OPERATION:  L3 laminectomy; L4 redo laminectomy; L3-4 transforaminal lumbar  interbody fusion; insertion of L3-4 interbody prosthesis (Capstone Peek cage  12 x 26); L3-4 posterior lumbar interbody fusion utilizing local autograft  bone and Vitoss; L3-S1 posterior segmental instrumentation with ST90 pedicle  screws and rods; using microdissection.   SURGEON:  Cristi Loron, M.D.   ASSISTANT:  Clydene Fake, M.D.   ANESTHESIA:  General endotracheal.   ESTIMATED BLOOD LOSS:  400 mL.   SPECIMENS:  None.   DRAINS:  None.   DESCRIPTION OF PROCEDURE:  The patient was brought to the operating room by  the anesthesia team.  General endotracheal anesthesia was induced.  The  patient was turned to the prone position on the Wilson frame.  His  lumbosacral region was then prepared with Betadine scrub and Betadine  solution and sterile  drapes were applied.  I then injected the area to be  incised with Marcaine with epinephrine solution.  I used a scalpel to make a  linear midline incision through the patient's previous surgical scar and  extended in a cephalad direction.  I used electrocautery to perform a  bilateral subperiosteal dissection exposing the spinous process of the  lamina of L3-4, as well as the sacrum and exposing the previous hardware.  We obtained an intraoperative radiograph to confirm our location.  We then  brought the operating microscope into the field and our instrumentation and  illumination completed the microdissection/decompression.  We inserted the  Versa-Trac retractor for exposure.  We began by using a high-speed drill to  perform bilateral L3 laminotomies.  We widened the L3 laminotomies with a  Kerrison's punch removing the L3-4 ligamentum flavum.  We also removed the  remainder of the L4 lamina.  In freeing up the lamina from the epidural scar  tissue we did create a small durotomy.  We repaired this by placing  interrupted and running 6-0 Prolene sutures.  We got a water-tight closure.  We then performed foraminotomies about the bilateral L4 and L5 nerve roots  completing the decompression.   We then incised the left L3-4 intervertebral disk with the 15 blade scalpel.  We performed an aggressive diskectomy using the pituitary forceps and  curets.  We cleared all soft tissues from the vertebral body and posterior  L3-4 in preparation for the posterior lumbar interbody fusion.  We then  prefilled a 12 x 26 mm Capstone Peek cage with local autograft bone (we  obtained autograft bone from the laminectomies we performed) and Vitoss.  We  then carefully retracted the thecal sac out of harm's way and inserted the  cage into the L3-4 interspace.  We turned it sideways, i.e., performed a  TLIF.  We then filled in posterior to the cage with local morcellized  autograft bone and Vitoss, completing  the posterior lumbar interbody fusion.   We then under fluoroscopic guidance cannulated the bilateral L3 pedicles  with bone probes.  We tapped the pedicles with a 5.25 mm tap.  We inserted a  6.75 x 50 mm pedicle screws bilaterally at L3 under fluoroscopic guidance.  We then removed the caps from the pedicle screws from L4 down to S1  bilaterally.  We removed the cross connector and then removed the rods.  We  then contoured a longer rod to connect the pedicle screws unilaterally from  A1-L3, we did this bilaterally.  We then secured the rods in place with the  caps and then placed a cross connector completing the posterior segmental  instrumentation.   We now turned our attention to posterolateral arthrodesis.  We decorticated  the remainder of the right L3-4 facet and pars and transverse process.  We  laid a combination of local morcellized autograft bone and Vitoss over these  decorticated posterolateral structures, completing the posterolateral  arthrodesis.   We then inspected the thecal sac and the exit route of the bilateral L4 and  L5 nerve roots and noted that it well decompressed.  We probed along the  medial aspect of the bilateral L3 pedicles with the ball probes and noted  there was no cortical bridges palpable.  We then obtained hemostasis using  bipolar electrocautery and we copiously irrigated the wound out with  bacitracin solution, and removed the solution.  We then applied Tisseel over  the durotomy site.  We then removed the Versa-Trac retractor.  We then  reapproximated the patient's thoracolumbar fascia with interrupted #1 Vicryl  suture, the subcutaneous tissue with interrupted 2-0 Vicryl suture, and skin  with Steri-Strips and benzoin.  The wound was covered with bacitracin  ointment.  Sterile dressings were applied.  The drapes were removed.  The  patient was subsequently returned to the supine position and extubated by the anesthesia team and transported to  postanesthesia care unit in stable  condition.  All sponge, instrument, and needle counts were correct at the  end of the case.      Cristi Loron, M.D.  Electronically Signed     JDJ/MEDQ  D:  02/13/2005  T:  02/15/2005  Job:  [id number]

## 2010-09-05 NOTE — Discharge Summary (Signed)
[facility]  Patient:    Derek [name], Derek [name]                      MRN: [id number] Adm. Date:  [id number] Disc. Date: [id number] Attending:  Tressie Stalker D                           Discharge Summary  For full details of this admission please refer to typed history and physical.  BRIEF HISTORY:  The patient is a 65 year old white male whom I performed a microdiskectomy on approximately one year ago.  Since then he has developed recurrent back and right leg pain which has failed medical management.  He was worked up with a lumbar MRI which demonstrated degenerative disc disease, herniated nucleus pulposus, etc., at the L4-5 and L5-S1.  The patient therefore weighed the risks, benefits, alternatives to surgery, and decided to proceed with a lumbar fusion.  For past medical history, past surgical history, medications prior to admission, drug allergies, family and medical history, social history, admission physical exam, imaging studies, assessment and plan, etc., please refer to the typed history and physical.  HOSPITAL COURSE:  I admitted the patient to [facility] on December 24, 1999, with the diagnosis of L4-5 and L5-S1 degenerative disc disease, recurrent herniated nucleus pulposus, spinal stenosis, lumbago, lumbar radiculopathy.  On the day of admission I performed a L5-S1 redo microdiskectomy with posterior lumbar interbody fusion L4-5 and L5-S1, insertion of Synthes cortical bone dows, posterior segmental instrumentation with pedicle screws and rods L4 to S1 bilaterally (surgical dynamics titanium 90 degree system), posterolateral arthrodesis utilizing local morcellized autograft bone, and cancellous allograft bone with graft-on-bone matrix.  The surgery went well except for a small dural tear which was suture repaired (for full details for this operation please refer to typed operative note).  The patients postoperative course was  initially remarkable for some fevers. He had fever to 101.3 on postop day #1.  This quickly resolved, and there were no signs of infection, and he did not have further fevers throughout his hospitalization.  He did, however, develop a prolonged ileus with some abdominal distention and nausea which required him to be n.p.o. for a prolonged period except for sips.  The ileus eventually resolved.  His hemoglobin count was checked on several occasions and remained stable at 9.1 on December 28, 1999.  By December 30, 1999, the patient was afebrile.  His vital signs were stable. He was eating well, ambulating well.  He was having bowel movements (he had some diarrhea, but it resolved).  He was requesting discharge to home.  At this time he was neurologically normal, i.e. had normal motor strength and his wound was healing well without signs of infection.  He was therefore discharged home on December 30, 1999.  DISCHARGE INSTRUCTIONS: 1. The patient was given written discharge instructions.  Instructed to wear    his lumbosacral corset at all times while out of bed. 2. Follow up with me in four weeks. 3. I highly encouraged him to quit smoking.  DISCHARGE MEDICATIONS: 1. Valium 5 mg #40 one p.o. q.6h. p.r.n. muscle spasm one refill. 2. Percocet 5 mg #60 one or two p.o. q.4h. p.r.n. pain. 3. The patient was instructed to resume his outpatient medical regimen.  FINAL DIAGNOSES: 1. L4-5 and L5-S1 recurrent herniated nucleus pulposus. 2. Degenerative disc disease. 3. Spinal stenosis. 4. Lumbago. 5. Lumbar radiculopathy.  PROCEDURE PERFORMED: 1. Redo microdiskectomy L5-S1. 2. Posterior lumbar interbody fusion, L4-5 and L5-S1. 3. Insertion of Synthes cortical bone dows, L4-5 and L5-S1. 4. Posterior segmental instrumentation with pedicle screws and rods, L4 to S1. 5. Posterolateral arthrodesis, L4 to S1, with local morcellized autograft bone    and cancellous autograft bone. DD:   12/30/99 TD:  12/31/99 Job: [id number]

## 2010-09-05 NOTE — Op Note (Signed)
NAME:  Derek [name], Derek [name]               ACCOUNT NO.:  [id number]   MEDICAL RECORD NO.:  [id number]          PATIENT TYPE:  AMB   LOCATION:  DAY                           FACILITY:  [facility]   PHYSICIAN:  R. Roetta Sessions, M.D. DATE OF BIRTH:  [birth year]   DATE OF PROCEDURE:  07/30/2005  DATE OF DISCHARGE:                                 OPERATIVE REPORT   PROCEDURE:  Surveillance colonoscopy.   INDICATIONS FOR PROCEDURE:  The patient is a 65 year old gentleman with a  history of colonic adenomas.  Last colonoscopy 2002 was negative for an  adenomatous polyps.  He has no lower GI tract symptoms.  Colonoscopy is now  being done.  This approach has been discussed with the patient at length.  The potential risks, benefits, and alternatives have been reviewed and  questions answered.  He is agreeable.  Please see the documentation in the  medical record.   PROCEDURE:  O2 saturation, blood pressure, pulses, and respirations were  monitored throughout the entirety of the procedure.  Conscious sedation was  with Versed 4 mg IV, Demerol 75 mg IV in divided doses.  The instrument used  was the Olympus video chip system.   FINDINGS:  Digital rectal examination revealed no abnormalities.   ENDOSCOPIC FINDINGS:  The prep was good.   Rectum:  Examination of the rectal mucosa including retroflex view of the  anal verge revealed only some internal hemorrhoids.   Colon:  The colonic mucosa was surveyed from the rectosigmoid junction  through the left, transverse, right colon to the area of the appendiceal  orifice, ileocecal valve, and cecum.  These structures were well-seen and  photographed for the record.  From this level, the scope was slowly  withdrawn.  All previously mentioned mucosal surfaces were again seen.  The  patient was noted to have left-sided diverticula.  There were no polyps  found.  The patient tolerated the procedure well and was reactive in  endoscopy.   IMPRESSION:  1.   Internal hemorrhoids; otherwise, normal rectum.  2.  Left-sided diverticula.  The remainder of the colonic mucosa appeared      normal.   RECOMMENDATIONS:  Surveillance colonoscopy in five years.      Jonathon Bellows, M.D.  Electronically Signed     RMR/MEDQ  D:  07/30/2005  T:  07/30/2005  Job:  [id number]   cc:   Kirk Ruths, M.D.  Fax: [phone]

## 2010-09-05 NOTE — Op Note (Signed)
NAME:  Derek [name], Derek [name]                         ACCOUNT NO.:  [id number]   MEDICAL RECORD NO.:  [id number]                   PATIENT TYPE:  INP   LOCATION:  3008                                 FACILITY:  [facility]   PHYSICIAN:  Cristi Loron, M.D.            DATE OF BIRTH:  Jun 12, 1945   DATE OF PROCEDURE:  01/26/2002  DATE OF DISCHARGE:                                 OPERATIVE REPORT   BRIEF HISTORY:  The patient is a 65 year old white male who was suffering  from neck and arm pain.  I had performed a C5-6 anterior cervical diskectomy  with fusion plate a couple of years ago, and did well after that surgery.  The patient worked up with a cervical MRI and a shoulder MRI.  Cervical MRI  demonstrated significant herniated disk at C4-5 and degenerative disk  disease.  The cervical MRI demonstrated significant rotator cuff tear.  I  discussed the risks with the patient.  The patient weighed the risks,  benefits, alternatives of surgery and decided to proceed with back surgery,  with anticipation of shoulder surgery done at a later date.   PREOPERATIVE DIAGNOSES:  C4-5 herniated nucleus pulposus, stenosis,  degenerative disk disease, cervicalgia, cervical radiculopathy.   POSTOPERATIVE DIAGNOSES:  C4-5 herniated nucleus pulposus, stenosis,  degenerative disk disease, cervicalgia, cervical radiculopathy.   PROCEDURE:  C4-5 extensive anterior cervical decompression/diskectomy;  interbody iliac crest arthrodesis; anterior cervical plating (Codman type  end-plate and screws); removal of C5-6 plate and screws/instrumentation.   SURGEON:  Cristi Loron, M.D.   ASSISTANT:  Clydene Fake, M.D.   ANESTHESIA:  General endotracheal.   ESTIMATED BLOOD LOSS:  100 cc.   SPECIMENS:  None.   DRAINS:  None.   COMPLICATIONS:  None.   DESCRIPTION OF PROCEDURE:  The patient was brought to the operating room by  the anesthesia team.  General endotracheal anesthesia was induced.  The  patient remained in the supine position. A roll was placed under his  shoulder to place his neck in slight extension.  His anterior cervical  region was then prepared with Betadine scrub and Betadine solution.  Sterile  drapes were applied.  I infiltrated the area to be incised with Marcaine  with epinephrine solution. I used a scalpel to make a transverse incision  due to the patient's pre-existing surgical scar.  I used the Metzenbaum  scissors to divide the platysma muscle.  I dissected medial to the  sternocleidomastoid muscle ___________ carotid artery.  I bluntly dissected  down to the anterior cervical spine, carefully identifying the esophagus and  retracting it medially.  There was considerable scar tissue from the prior  surgery.  Using Kitner swabs at the fifth __________, I exposed the anterior  cervical plate at Z6-1 and then unlocked the locking screw and removed the  four screws from the plate and then removed the plate.  I then inserted a  bent  spinal needle into the next higher interspace and obtained  _________radiograph to confirm location, and as expected demonstrated the  needle was in C4-5.   We then began the anterior cervical decompression by detaching the medial  border of the longus colli bilaterally from the C4-5 intervertebral disk  space bilaterally.  The Caspar self-retaining retractor was inserted for  exposure, and then the 15 blade scalpel was used to detach the 4-5  intervertebral disk.  A partial diskectomy was performed using pituitary  forceps and Harman curettes.  Distractors were placed into C4 and C5, and  then the interspace was distracted. The high-speed drill was decorticate C4-  5 and then to drill away the remainder of C4-5 intervertebral disk.   The operating microscope was then brought into the field.  Under  magnification and illumination, the decompression was completed.  The high-  speed drill was then used to thin out the posterior  longitudinal ligament at  C4-5.  The ligament was then incised and then removed with Kerrison punch.  __________ end plates of Z6-1, decompressing the thecal sac.  Foraminotomies  were then performed at about the C5 nerve roots, getting good decompression  bilaterally.   Having completed the decompression, we now turned our attention to the  arthrodesis.  We obtained iliac crest cortical allograft bone graft and  __________ dimensions, 7 mm in height and 1 cm in depth.  The bone graft was  placed into the C4-5 interspace.  Distraction screws were removed, and it  was noted that there was a good __________ bone graft.   We now turned our attention to anterior spinal instrumentation.  We used the  plate we had removed from C5-6 and placed it over the C4-5 interspace.  Inserted two screws into the previous holes at C5 and then drilled two new  holes at C4 __________ .  We then obtained intraoperative radiograph.  The  screws at C5 were somewhat cephalad directed, and one of the screws at C4  had entered the vertebral disk space.  We then removed all screws in the  upper area and the screw at C4.  We inspected the screws by retapping and  then reinserted the screws and then obtained another radiograph that  demonstrated better position of the screws.  We then secured the screws to  the plate by using a Ken tightener at each screw.   The wound was the copiously irrigated with Bacitracin solution.  The  solution was removed.  Stringent hemostasis was obtained using bipolar  electrocautery and Gelfoam. The Caspar self-retaining retractor was removed.  The esophagus was inspected for any damage, and there was none apparent.  We  then reapproximated the patient's platysma muscle with interrupted 3-0  Vicryl suture, subcutaneous tissue with interrupted 3-0 Vicryl suture, and  the skin with Steri-Strips and Benzoin.  The wound was then coated with Bacitracin ointment, and a sterile dressing was  applied.  The __________  were removed.  The patient was subsequently extubated by the anesthesia team  and transported to the postoperative anesthesia care unit in a stable  condition.  All sponge, instrument, and needle counts were  correct at the  end of this case.                                               Cristi Loron, M.D.  JDJ/MEDQ  D:  01/26/2002  T:  01/27/2002  Job:  [id number]

## 2010-09-05 NOTE — Op Note (Signed)
[facility]  Patient:    Derek [name], Derek [name]                      MRN: [id number] Proc. Date: 12/24/99 Adm. Date:  [id number] Attending:  Tressie Stalker D                           Operative Report  BRIEF HISTORY:  The patient is a 65 year old white male, who I performed a microdiskectomy on approximately one year ago.  Since then, he has developed recurrent back and right leg pain, which has failed medical management.  He was worked up with a lumbar MRI.  It demonstrated degenerative disk disease, herniated nucleus pulposus L4-5, L5-S1.  The patient therefore weighed the risks, benefits, and alternatives to surgery and decided to proceed with a lumbar fusion.  PREOPERATIVE DIAGNOSES:  L4-5 and L5-S1 degenerative disk disease, recurrent herniated nucleus pulposus, spinal stenosis, lumbago, and lumbar radiculopathy.  POSTOPERATIVE DIAGNOSES:  L4-5 and L5-S1 degenerative disk disease, recurrent herniated nucleus pulposus, spinal stenosis, lumbago, and lumbar radiculopathy.  PROCEDURE:  L5-S1 redo microdiskectomy with posterior lumbar interbody fusion L4-5 and L5-S1, insertion of Synthes cortical bone dowels (13 mm at L5-S1, 15 mm at L4-5 bilaterally), posterior segmental instrumentation with pedicle screws and rods L4 and S1 bilaterally ([employer] titanium 90 degree system), posterolateral arthrodesis utilizing local morcellized autograft bone, cancellous allograft bone and Graft-on bone matrix.  SURGEON:  Cristi Loron, M.D.  ASSISTANT:  Danae Orleans. Venetia Maxon, M.D.  ANESTHESIA:  General endotracheal.  ESTIMATED BLOOD LOSS:  650 cc.  SPECIMENS:  None.  COMPLICATIONS:  Small dural tear (suture repaired).  DRAINS:  None.  DESCRIPTION OF PROCEDURE:  The patient was brought to the operating room by the anesthesia team, general endotracheal anesthesia was induced.  The patient was then turned to the prone position on the Wilson frame.   His lumbosacral region was then shaved and prepared with Betadine scrub and Betadine solution and sterile drapes were applied.  I then injected the area to be incised with Marcaine with epinephrine solution and used the scalpel to make a vertical incision at the patients lumbosacral junction.  I used electrocautery to dissect down to the thoracolumbar fascia and divided the fascia bilaterally and performing a bilateral subperiosteal dissection, stripping the paraspinous musculature from the spinous process and lamina of L4, L5 and the upper sacrum.  I inserted the McCullough retractor for exposure and obtained intraoperative radiograph to confirm my location.  Of note, there was some epidural fibrosis/scarring at L5-S1 on the right from his previous surgery.  I began on the left.  I performed a left L4 and L5 laminotomies and foraminotomies using the Midas Rex high-speed drill to perform the laminotomy. I widened the laminotomy with the Kerrison punch, removing the L4-5 and L5-S1 ligamentum flavum.  I then performed foraminotomies about the left L5 and S1 nerve root.  I identified the L4 nerve root as it exited around the L4 pedicle.  I excised the L4-5 and L5-S1 intervertebral disk performing an aggressive diskectomy using the pituitary forceps, Epstein and Scoville curets, scraping the vertebral endplates at L4-5 and L5-S1.  I then repeated this procedure at L4-5 and L5-S1 on the right.  I used the Midas Rex high-speed drill to perform a right L4 laminotomy, removed the ligamentum flavum at L4-5, performed a foraminotomy about the L5 nerve root.  Of note, there was considerable scarring  at L5-S1 on the right and I carefully dissected the scar tissue from the thecal sac and the right S1 nerve root. There was a free fragment of disk herniation within the scar tissue compressing the right S1 nerve root and the axilla of the L5 nerve root on the right.  I removed it with pituitary  forceps.  In the process of the decompression, I did create a small hole in the dura at the L5-S1 on the left.  I suture repaired this with a 6-0 Prolene suture figure-of-eight stitch with good tight closure.  Having completed the diskectomy, I now turn my attention to the arthrodesis. I inserted the vertebral body spreader at the right on L5-S1, distracted the L5-S1 interspace and then carefully retracted the left S1 nerve root medially and inserted a 13 mm cortical bone graft on the left side.  I removed the retractor and in a similar fashion, I placed a 13 mm Synthes bone graft and bone spacer on the right side.  I then packed in between the two bone grafts with a combination of local morcellized autograft bone and cancellous allograft bone.  I then repeated this procedure at L4-5, except at this level, I inserted bilateral 15 mm bone grafts and in a similar fashion packed between bone graft with a combination of local morcellized autograft bone and the cancellous allograft bone.  I then inspected the thecal sac and the bilateral L4, L5 and S1 nerve roots and noted that they were well-decompressed and not injured during this process.  Having completed the posterior lumbar interbody fusion, I now turn my attention to the posterior segmental instrumentation.  I used electrocautery to expose the bilateral L4 and L5 transverse processes.  I then, under fluoroscopic guidance, decorticated above the bilateral L4, L5 and S1 pedicles, cannulated the pedicles with the pedicle probe and then tapped the pedicles.  I felt about the tapped pedicles and felt that the screws were well within bone and then I inserted a 6.75 x 45 mm screws in bilaterally at L4 and L5 and 6.75 x 35 mm screws at S1 bilaterally under fluoroscopic guidance.  I then palpated around the pedicles and around the bilateral L4, L5 and S1 nerve roots and noted they were not compressed.  I then connected the screws with  a lordosed 70 mm titanium rod and placed the locking caps on the screws and then placed a cross connector between the rods and I connected all the caps in the appropriate fashion.   Having completed the segmental instrumentation, I now turned my attention to posterolateral arthrodesis.  I used the Midas Rex high-speed drill to decorticate about the bilaterally L4 and L5 facets and lateral ______, as well as, the upper sacrum.  I laid a combination of local morcellized autograft bone, cancellous allograft bone and Graft-on puddy out laterally to augment the posterior lumbar interbody fusion.  I also performed a posterolateral arthrodesis.  I then achieved stringent hemostasis with bipolar electrocautery.  I inspected the thecal sac and the bilateral L4, L5 and S1 nerve roots and noted ______ construction was well-decompressed.  I then removed the McCullough retractor and reapproximated the patients thoracolumbar fascia with interrupted #1 Vicryl, subcutaneous tissues with interrupted 2-0 Vicryl, the skin with Steri-Strips and benzoin.  The wound was then coated with bacitracin ointment and sterile dressing applied.  The drapes were removed and the patient was subsequently extubated by the anesthesia team and transported to the post anesthesia care unit in stable condition.  All sponge, instrument and needle counts were correct at the end of this case. DD:  12/24/99 TD:  12/25/99 Job: [id number]

## 2010-09-05 NOTE — Discharge Summary (Signed)
[name], Derek [name]               ACCOUNT NO.:  [id number]   MEDICAL RECORD NO.:  [id number]          PATIENT TYPE:  INP   LOCATION:  3010                         FACILITY:  [facility]   PHYSICIAN:  Coletta Memos, M.D.     DATE OF BIRTH:  February 13, 1946   DATE OF ADMISSION:  02/12/2005  DATE OF DISCHARGE:  02/15/2005                                 DISCHARGE SUMMARY   ADMITTING DIAGNOSIS:  He was admitted to the hospital for an L3-4 posterior  lumbar interbody fusion, pedicle screw fixation, and posterolateral  arthrodesis.  He has had previous surgeries before at L4-5 and L5-S1.  He  seemed to have been doing fairly well with that.  He was admitted, taken to  the operating room and had an uncomplicated procedure.  At discharge, his  wound is clean, dry, and without signs of infection.  He has 5/5 strength in  the lower extremities.  He is ambulating, tolerating a regular diet, and  voiding without difficulty.  He will be discharged home with Percocet and  Valium.  He will have a return appointment with Dr. Lovell Sheehan.           ______________________________  Coletta Memos, M.D.     KC/MEDQ  D:  02/15/2005  T:  02/15/2005  Job:  [id number]

## 2010-09-05 NOTE — Op Note (Signed)
NAME:  Derek [name], Derek [name]                         ACCOUNT NO.:  [id number]   MEDICAL RECORD NO.:  [id number]                   PATIENT TYPE:  AMB   LOCATION:  DSC                                  FACILITY:  [facility]   PHYSICIAN:  Robert A. Thurston Hole, M.D.              DATE OF BIRTH:  1946-03-20   DATE OF PROCEDURE:  03/27/2002  DATE OF DISCHARGE:                                 OPERATIVE REPORT   PREOPERATIVE DIAGNOSIS:  Right shoulder rotator cuff tear with partial  glenoid labrum tear.   POSTOPERATIVE DIAGNOSIS:  Right shoulder rotator cuff tear with partial  glenoid labrum tear.   PROCEDURES:  1. Right shoulder examination under anesthesia followed by arthroscopic     partial labrum tear debridement.  2. Right shoulder arthroscopically assisted rotator cuff tear repair using     Arthrex suture anchor x 1.   SURGEON:  Elana Alm. Thurston Hole, M.D.   ASSISTANT:  Candace Cruise. Minor, P.A.   ANESTHESIA:  General.   OPERATIVE TIME:  45 minutes.   COMPLICATIONS:  None.   INDICATIONS FOR PROCEDURE:  The patient is a 65 year old gentleman who has  had increasing right shoulder pain for the past 8-10 months, increasing in  nature with signs and symptoms and MRI documenting a complete rotator cuff  tear.  He has failed conservative care and is now to undergo arthroscopy and  rotator cuff repair.   DESCRIPTION OF PROCEDURE:  The patient was brought to the operating room on  March 27, 2002.  He was placed on the operating table in the supine  position.  After being placed under general anesthesia, his right shoulder  was examined under anesthesia.  He had full range of motion and his shoulder  was stable ligamentous exam.  He was then placed in a beach chair position  and his shoulder and arm were prepped using sterile DuraPrep and draped  using sterile technique.  Originally through a posterior arthroscopic  portal, the arthroscope with a pump attached was placed and through an  anterior portal  an arthroscopic probe was placed.  On initial inspection,  the articular cartilage in the glenohumeral joint was intact.  The anterior  ligament showed a partial tear of 25%, which was debrided.  The superior  labrum and biceps tendon anchor were intact.  The biceps tendon was intact.  The inferior labrum and anterior inferior glenohumeral ligament complex was  intact.  The inferior capsular recess was free of pathology.  The rotator  cuff was inspected.  He had a small, but complete tear of the supraspinatus  comprising 25-30% of the tendon.  This was debrided arthroscopically.  The  subacromial space was entered and a lateral arthroscopic portal was made.  The rotator cuff tear was further visualized and further debrided  arthroscopically.  The patient had previously had a subacromial  decompression three years ago and a new decompression was not necessary.  At  this point then, through an accessory portal an Arthrex suture anchor was  placed in the greater tuberosity and each of these sutures were passed  through the rotator cuff tear and tied down arthroscopically, thus  reattaching the rotator cuff back down to the greater tuberosity very firmly  and tightly in place.  After this was done, the should could be brought  through a full range of motion with no impingement on the repair.  At this  point, the instruments were removed.  The portals were closed with a 3-0  nylon suture.  A sterile dressing and a sling were applied.  The patient was  awaken and taken to the recovery room in stable condition.   FOLLOW-UP CARE:  The patient will be followed as an overnight recovery care  patient versus discharge home depending on his level of postoperative pain  and nausea.  He will be seen back in the office in a week for sutures out  and follow-up.  He will start early physical therapy for passive range of  motion only x 6 weeks and then active thereafter.                                                Robert A. Thurston Hole, M.D.    RAW/MEDQ  D:  03/27/2002  T:  03/27/2002  Job:  [id number]

## 2011-01-08 LAB — CREATININE, SERUM
Creatinine, Ser: 0.84
GFR calc Af Amer: >60
GFR calc non Af Amer: >60

## 2011-01-23 LAB — BASIC METABOLIC PANEL
Calcium: 8.3 — ABNORMAL LOW
Calcium: 9.1
Creatinine, Ser: 0.84
GFR calc Af Amer: >60
GFR calc non Af Amer: >60
GFR calc non Af Amer: >60
Glucose, Bld: 121 — ABNORMAL HIGH
Glucose, Bld: 69 — ABNORMAL LOW
Sodium: 131 — ABNORMAL LOW

## 2011-01-23 LAB — CBC
MCHC: 34.1
MCV: 91.1
Platelets: 254
RDW: 13.3
WBC: 12.5 — ABNORMAL HIGH

## 2011-01-23 LAB — TYPE AND SCREEN: ABO/RH(D): O POS

## 2011-01-28 ENCOUNTER — Other Ambulatory Visit: Payer: Self-pay | Admitting: Neurosurgery

## 2011-01-28 DIAGNOSIS — M549 Dorsalgia, unspecified: Secondary | ICD-10-CM

## 2011-02-03 ENCOUNTER — Ambulatory Visit
Admission: RE | Admit: 2011-02-03 | Discharge: 2011-02-03 | Disposition: A | Discharge status: Home / Self Care | Payer: Medicare Other | Source: Ambulatory Visit | Attending: Neurosurgery | Admitting: Neurosurgery

## 2011-02-03 DIAGNOSIS — M549 Dorsalgia, unspecified: Secondary | ICD-10-CM

## 2011-02-03 MED ORDER — GADOBENATE DIMEGLUMINE 529 MG/ML IV SOLN
18.0000 mL | Freq: Once | INTRAVENOUS | Status: AC | PRN
  Administered 2011-02-03: 18 mL via INTRAVENOUS

## 2011-07-01 ENCOUNTER — Ambulatory Visit (INDEPENDENT_AMBULATORY_CARE_PROVIDER_SITE_OTHER): Payer: Medicare Other | Admitting: *Deleted

## 2011-07-01 DIAGNOSIS — I70219 Atherosclerosis of native arteries of extremities with intermittent claudication, unspecified extremity: Secondary | ICD-10-CM

## 2011-07-01 DIAGNOSIS — Z48812 Encounter for surgical aftercare following surgery on the circulatory system: Secondary | ICD-10-CM

## 2011-07-16 ENCOUNTER — Other Ambulatory Visit: Payer: Self-pay | Admitting: *Deleted

## 2011-07-16 ENCOUNTER — Encounter: Payer: Self-pay | Admitting: Vascular Surgery

## 2011-07-16 DIAGNOSIS — Z48812 Encounter for surgical aftercare following surgery on the circulatory system: Secondary | ICD-10-CM

## 2011-07-16 DIAGNOSIS — I70219 Atherosclerosis of native arteries of extremities with intermittent claudication, unspecified extremity: Secondary | ICD-10-CM

## 2011-07-16 NOTE — Procedures (Unsigned)
AORTA-ILIAC DUPLEX EVALUATION  INDICATION:  Left common iliac artery stent  HISTORY: Diabetes:  No Cardiac:  No Hypertension:  No Smoking:  Yes Previous Surgery:  Left common iliac artery stent in 2006              SINGLE LEVEL ARTERIAL EXAM                             RIGHT                  LEFT Brachial: Anterior tibial: Posterior tibial: Peroneal: Ankle/brachial index: Previous ABI/date:  AORTA-ILIAC DUPLEX EXAM Aorta - Proximal     65 cm/s Aorta - Mid          62 cm/s Aorta - Distal       64 cm/s  RIGHT                                   LEFT 162 cm/s          CIA-PROXIMAL          131 cm/s cm/s              CIA-DISTAL            247 cm/s cm/s              HYPOGASTRIC           Not visualized cm/s              EIA-PROXIMAL          137 cm/s cm/s              EIA-MID               81 cm/s cm/s              EIA-DISTAL            118 cm/s  IMPRESSION: 1. Patent left common iliac artery stent with a maximum velocity of     247 cm/s noted. 2. Maximum diameter measurements of 1.3 x 1.3 cm noted of the right     common iliac artery and 1.6 x 1.6 cm of the left distal common     iliac artery. 3. Decreased visualization of the aortoiliac system due to overlying     bowel gas patterns and patient body habitus.  The patient was not     n.p.o. for this exam. 4. No significant change in maximum diameter and velocity measurements     when compared to the previous exam on 07/01/2010.  ___________________________________________ Larina Earthly, M.D.  CH/MEDQ  D:  07/07/2011  T:  07/07/2011  Job:  [id number]

## 2011-09-10 ENCOUNTER — Telehealth: Payer: Self-pay | Admitting: Vascular Surgery

## 2011-09-10 NOTE — Telephone Encounter (Signed)
Mr. Brumbaugh is aware of the appointment.

## 2011-09-11 ENCOUNTER — Encounter: Payer: Self-pay | Admitting: Vascular Surgery

## 2011-09-11 ENCOUNTER — Other Ambulatory Visit: Payer: Self-pay

## 2011-09-11 ENCOUNTER — Encounter: Payer: Self-pay | Admitting: Neurosurgery

## 2011-09-11 DIAGNOSIS — I70219 Atherosclerosis of native arteries of extremities with intermittent claudication, unspecified extremity: Secondary | ICD-10-CM

## 2011-09-15 ENCOUNTER — Ambulatory Visit (INDEPENDENT_AMBULATORY_CARE_PROVIDER_SITE_OTHER): Payer: Medicare Other | Admitting: Neurosurgery

## 2011-09-15 ENCOUNTER — Encounter: Payer: Self-pay | Admitting: Neurosurgery

## 2011-09-15 ENCOUNTER — Ambulatory Visit (INDEPENDENT_AMBULATORY_CARE_PROVIDER_SITE_OTHER): Payer: Medicare Other | Admitting: Vascular Surgery

## 2011-09-15 VITALS — BP 124/78 | HR 100 | Resp 16 | Ht 69.0 in | Wt 178.0 lb

## 2011-09-15 DIAGNOSIS — Z48812 Encounter for surgical aftercare following surgery on the circulatory system: Secondary | ICD-10-CM

## 2011-09-15 DIAGNOSIS — M79609 Pain in unspecified limb: Secondary | ICD-10-CM

## 2011-09-15 DIAGNOSIS — I70219 Atherosclerosis of native arteries of extremities with intermittent claudication, unspecified extremity: Secondary | ICD-10-CM

## 2011-09-15 NOTE — Progress Notes (Signed)
[facility] [place] HISTORY AND PHYSICAL   CC: Annual lower extremity arterial study with a history of left CIA stent x2 in June of 2006 Referring Physician: Early  History of Present Illness: 66 year old male patient of Dr. early with a history of left CIA stent x2 in June 2006. Patient's back today for ABIs and evaluation of his stent graft which appear to be patent bilaterally. Patient has complaints of lateral pelvis/hip pain that radiates into his groin. Patient states he seen several orthopedists and gotten mixed diagnoses, he's been told he needs hip surgery of one type or other versus another physician saying he does not need a hip replacement. The patient also has a history with Dr. Lovell Sheehan of having lumbar fusion. Patient reports no signs or symptoms of claudication with ambulation just hip pain  Past Medical History  Diagnosis Date  . Peripheral vascular disease   . Arthritis   . Cancer     ROS: [x]  Positive   [ ]  Denies    General: [ ]  Weight loss, [ ]  Fever, [ ]  chills Neurologic: [ ]  Dizziness, [ ]  Blackouts, [ ]  Seizure [ ]  Stroke, [ ]  "Mini stroke", [ ]  Slurred speech, [ ]  Temporary blindness; [ ]  weakness in arms or legs, [ ]  Hoarseness Cardiac: [ ]  Chest pain/pressure, [ ]  Shortness of breath at rest [ ]  Shortness of breath with exertion, [ ]  Atrial fibrillation or irregular heartbeat Vascular: [ ]  Pain in legs with walking, [ ]  Pain in legs at rest, [ ]  Pain in legs at night,  [ ]  Non-healing ulcer, [ ]  Blood clot in vein/DVT,   Pulmonary: [ ]  Home oxygen, [ ]  Productive cough, [ ]  Coughing up blood, [ ]  Asthma,  [ ]  Wheezing Musculoskeletal:  [ ]  Arthritis, [ ]  Low back pain, [ ]  Joint pain Hematologic: [ ]  Easy Bruising, [ ]  Anemia; [ ]  Hepatitis Gastrointestinal: [ ]  Blood in stool, [ ]  Gastroesophageal Reflux/heartburn, [ ]  Trouble swallowing Urinary: [ ]  chronic Kidney disease, [ ]  on HD - [ ]  MWF or [ ]  TTHS, [ ]  Burning with urination, [ ]   Difficulty urinating Skin: [ ]  Rashes, [ ]  Wounds Psychological: [ ]  Anxiety, [ ]  Depression   Social History History  Substance Use Topics  . Smoking status: Current Everyday Smoker -- 1.5 packs/day  . Smokeless tobacco: Not on file  . Alcohol Use: No    Family History Family History  Problem Relation Age of Onset  . Heart disease Mother   . Heart attack Father   . Heart disease Father     Heart Disease before age 50    Allergies  Allergen Reactions  . Oxycodone Nausea Only    High dose    Current Outpatient Prescriptions  Medication Sig Dispense Refill  . amLODipine (NORVASC) 10 MG tablet Take 10 mg by mouth Daily.      . diazepam (VALIUM) 10 MG tablet Take 10 mg by mouth every 6 (six) hours as needed.      . furosemide (LASIX) 20 MG tablet Take 20 mg by mouth as needed.      . gabapentin (NEURONTIN) 600 MG tablet Take 600 mg by mouth 3 (three) times daily.      Marland Kitchen lisinopril (PRINIVIL,ZESTRIL) 20 MG tablet Take 20 mg by mouth Daily.      Marland Kitchen lisinopril-hydrochlorothiazide (PRINZIDE,ZESTORETIC) 20-25 MG per tablet Take 20-25 mg by mouth Daily.      Marland Kitchen oxyCODONE-acetaminophen (PERCOCET)  10-325 MG per tablet Take 1 tablet by mouth every 4 (four) hours as needed.      . Celecoxib (CELEBREX PO) Take by mouth.        Physical Examination  Filed Vitals:   09/15/11 1502  BP: 124/78  Pulse: 100  Resp: 16    Body mass index is 26.29 kg/(m^2).  General:  WDWN in NAD Gait: Normal HEENT: WNL Eyes: Pupils equal Pulmonary: normal non-labored breathing , without Rales, rhonchi,  wheezing Cardiac: RRR, without  Murmurs, rubs or gallops; No carotid bruits Abdomen: soft, NT, no masses Skin: no rashes, ulcers noted Vascular Exam/Pulses: Lower extremity pulses are palpable but minimal, femoral pulses are 2+  Extremities without ischemic changes, no Gangrene , no cellulitis; no open wounds;  Musculoskeletal: no muscle wasting or atrophy  Neurologic: A&O X 3; Appropriate  Affect ; SENSATION: normal; MOTOR FUNCTION:  moving all extremities equally. Speech is fluent/normal  Non-Invasive Vascular Imaging: ABIs today are 1.01 on the right and triphasic to biphasic, 0.86 on the left and triphasic  ASSESSMENT/PLAN: I saw the patient with Dr. Arbie Cookey who reviewed the ABIs which are normal, we did discuss with the patient the possibility he may need a second or third opinion on his hip with another orthopedist. Therefore we'll make a referral to Dr. Sherlean Foot with [facility] for evaluation of his hips. The patient's in agreement with this, he will return here in one year for lower extremity ABIs and to be seen in my clinic. His questions were encouraged and answered.  Lauree Chandler ANP  Clinic M.D.: Early

## 2011-09-15 NOTE — Progress Notes (Signed)
ABI performed @ [facility] 09/15/2011 

## 2011-09-16 NOTE — Progress Notes (Signed)
Addended by: Sharee Pimple on: 09/16/2011 09:03 AM   Modules accepted: Orders

## 2011-12-09 ENCOUNTER — Telehealth: Payer: Self-pay | Admitting: *Deleted

## 2011-12-09 NOTE — Telephone Encounter (Signed)
Tried to call with no answer  

## 2011-12-09 NOTE — Telephone Encounter (Signed)
He has an appointment on Tuesday at 9:00

## 2011-12-09 NOTE — Telephone Encounter (Signed)
Derek [name] came by the office today. He would like to set up a colonoscopy. His last one was in 2007 with Dr Jena Gauss. Please call him back. Thanks.

## 2011-12-14 ENCOUNTER — Encounter: Payer: Self-pay | Admitting: Internal Medicine

## 2011-12-15 ENCOUNTER — Other Ambulatory Visit: Payer: Self-pay | Admitting: Internal Medicine

## 2011-12-15 ENCOUNTER — Ambulatory Visit (INDEPENDENT_AMBULATORY_CARE_PROVIDER_SITE_OTHER): Payer: Medicare Other | Admitting: Gastroenterology

## 2011-12-15 ENCOUNTER — Encounter: Payer: Self-pay | Admitting: Gastroenterology

## 2011-12-15 VITALS — BP 151/84 | HR 109 | Temp 97.4°F | Ht 69.0 in | Wt 189.2 lb

## 2011-12-15 DIAGNOSIS — D369 Benign neoplasm, unspecified site: Secondary | ICD-10-CM

## 2011-12-15 DIAGNOSIS — R160 Hepatomegaly, not elsewhere classified: Secondary | ICD-10-CM | POA: Insufficient documentation

## 2011-12-15 MED ORDER — PEG 3350-KCL-NA BICARB-NACL 420 G PO SOLR: 4000.0000 mL | ORAL | Status: AC

## 2011-12-15 NOTE — Progress Notes (Signed)
Faxed to PCP

## 2011-12-15 NOTE — Patient Instructions (Addendum)
You have been set up for an ultrasound of your belly to look at your liver. We will call with results. I will also request the blood work done from Dr. Sherwood Gambler.   You have been set up for a colonoscopy with Dr. Jena Gauss in the near future.

## 2011-12-15 NOTE — Assessment & Plan Note (Signed)
On routine physical exam, possible palpable liver edge noted about 2 fingerbreadths below right costal margin. Pt has hx of ETOH use in remote past but none recently. Obtain labs from Dr. Sherwood Gambler, [place] of abdomen to assess for hepatomegaly, cirrhosis, or other etiologies.

## 2011-12-15 NOTE — Progress Notes (Signed)
Primary Care Physician:  MCGOUGH,WILLIAM M, MD Primary Gastroenterologist:  Dr. Rourk   Chief Complaint  Patient presents with  . Colonoscopy    HPI:   66-year-old male presenting today for an office visit prior to surveillance colonoscopy due to hx of colonic adenomas. TCS 2007 without polyps. Notes rare lower abdominal discomfort, usually associated with loose stools then resolves. Chronic. Sometimes constipated, will take MOM with good results. Notes low-volume hematochezia intermittently, hx of internal hemorrhoids. No loss of appetite or weight loss. Told triglycerides were high. Attempting to change eating habits. Wants to lose weight. No nausea or vomiting. No reflux, indigestion, dysphagia.   Past Medical History  Diagnosis Date  . Peripheral vascular disease   . Arthritis   . Cancer     skin  . Adenomatous polyps     remote past, on 5 year surveillance    Past Surgical History  Procedure Date  . Spine surgery     lumbar & cervical spine surgeries X 8   . Iliac artery stent 2006    Left CIA stenting  . Colonoscopy 07/30/2005    Internal hemorrhoids; otherwise, normal rectum/ Left-sided diverticula    Current Outpatient Prescriptions  Medication Sig Dispense Refill  . amLODipine (NORVASC) 10 MG tablet Take 10 mg by mouth Daily.      . diazepam (VALIUM) 10 MG tablet Take 10 mg by mouth every 6 (six) hours as needed.      . furosemide (LASIX) 20 MG tablet Take 20 mg by mouth as needed.      . gabapentin (NEURONTIN) 600 MG tablet Take 600 mg by mouth 3 (three) times daily.      . lisinopril (PRINIVIL,ZESTRIL) 20 MG tablet Take 20 mg by mouth Daily.      . oxyCODONE-acetaminophen (PERCOCET) 10-325 MG per tablet Take 1 tablet by mouth every 4 (four) hours as needed.      . Celecoxib (CELEBREX PO) Take by mouth.      . lisinopril-hydrochlorothiazide (PRINZIDE,ZESTORETIC) 20-25 MG per tablet Take 20-25 mg by mouth Daily.        Allergies as of 12/15/2011 - Review Complete  12/15/2011  Allergen Reaction Noted  . Oxycodone Nausea Only 09/15/2011    Family History  Problem Relation Age of Onset  . Heart disease Mother   . Heart attack Father   . Heart disease Father     Heart Disease before age 60  . Colon cancer Neg Hx     History   Social History  . Marital Status: Married    Spouse Name: N/A    Number of Children: N/A  . Years of Education: N/A   Occupational History  . disability    Social History Main Topics  . Smoking status: Current Everyday Smoker -- 1.5 packs/day  . Smokeless tobacco: Not on file  . Alcohol Use: No  . Drug Use: No  . Sexually Active: Not on file   Other Topics Concern  . Not on file   Social History Narrative  . No narrative on file    Review of Systems: Gen: Denies any fever, chills, loss of appetite, fatigue, weight loss. CV: Denies chest pain, heart palpitations, syncope, peripheral edema. Resp: Denies shortness of breath with rest, cough, wheezing GI: Denies dysphagia or odynophagia. Denies hematemesis, fecal incontinence, or jaundice.  GU : Denies urinary burning, urinary frequency, urinary incontinence.  MS: Denies joint pain, muscle weakness, cramps, limited movement Derm: Denies rash, itching, dry skin Psych: Denies depression, anxiety,   confusion or memory loss  Heme: Denies bruising, bleeding, and enlarged lymph nodes.  Physical Exam: BP 151/84  Pulse 109  Temp 97.4 F (36.3 C) (Temporal)  Ht 5' 9" (1.753 m)  Wt 189 lb 3.2 oz (85.821 kg)  BMI 27.94 kg/m2 General:   Alert and oriented. Well-developed, well-nourished, pleasant and cooperative. Head:  Normocephalic and atraumatic. Eyes:  Conjunctiva pink, sclera clear, no icterus.    Ears:  Normal auditory acuity. Nose:  No deformity, discharge,  or lesions. Mouth:  No deformity or lesions, mucosa pink and moist.  Neck:  Supple, without mass or thyromegaly. Lungs:  Mild expiratory wheeze bilaterally, posterior upper lobes Heart:  S1, S2  present without murmurs noted.  Abdomen:  +BS, soft, non-tender and non-distended. Without mass or hernias. Questionable liver edge noted 2 finger-breadths below right costal margin. Rectal:  Deferred  Msk:  Symmetrical without gross deformities. Normal posture. Extremities:  Without clubbing or edema. Neurologic:  Alert and  oriented x4;  grossly normal neurologically. Skin:  Intact, warm and dry without significant lesions or rashes Cervical Nodes:  No significant cervical adenopathy. Psych:  Alert and cooperative. Normal mood and affect.   

## 2011-12-15 NOTE — Assessment & Plan Note (Signed)
66 year old male with hx of colonic adenomas, last TCS without evidence of polyps in 2007. Due for surveillance. Intermittent low-volume hematochezia likely secondary to known hx of internal hemorrhoids. No change in bowel habits or other concerning signs.   Proceed with TCS with Dr. Jena Gauss in near future: the risks, benefits, and alternatives have been discussed with the patient in detail. The patient states understanding and desires to proceed.

## 2011-12-17 ENCOUNTER — Ambulatory Visit (HOSPITAL_COMMUNITY): Payer: Medicare Other

## 2011-12-30 ENCOUNTER — Encounter (HOSPITAL_COMMUNITY): Payer: Self-pay | Admitting: Pharmacy Technician

## 2012-01-11 ENCOUNTER — Encounter (HOSPITAL_COMMUNITY): Payer: Self-pay

## 2012-01-11 ENCOUNTER — Encounter (HOSPITAL_COMMUNITY)
Admission: RE | Disposition: A | Discharge status: Home / Self Care | Payer: Self-pay | Source: Ambulatory Visit | Attending: Internal Medicine

## 2012-01-11 ENCOUNTER — Ambulatory Visit (HOSPITAL_COMMUNITY)
Admission: RE | Admit: 2012-01-11 | Discharge: 2012-01-11 | Disposition: A | Discharge status: Home / Self Care | Payer: Medicare Other | Source: Ambulatory Visit | Attending: Internal Medicine | Admitting: Internal Medicine

## 2012-01-11 DIAGNOSIS — R160 Hepatomegaly, not elsewhere classified: Secondary | ICD-10-CM

## 2012-01-11 DIAGNOSIS — D126 Benign neoplasm of colon, unspecified: Secondary | ICD-10-CM

## 2012-01-11 DIAGNOSIS — Z860100 Personal history of colon polyps, unspecified: Secondary | ICD-10-CM

## 2012-01-11 DIAGNOSIS — D369 Benign neoplasm, unspecified site: Secondary | ICD-10-CM

## 2012-01-11 DIAGNOSIS — K5730 Diverticulosis of large intestine without perforation or abscess without bleeding: Secondary | ICD-10-CM

## 2012-01-11 DIAGNOSIS — Z1211 Encounter for screening for malignant neoplasm of colon: Secondary | ICD-10-CM

## 2012-01-11 HISTORY — PX: COLONOSCOPY: SHX5424

## 2012-01-11 SURGERY — COLONOSCOPY
Anesthesia: Moderate Sedation

## 2012-01-11 MED ORDER — MEPERIDINE HCL 100 MG/ML IJ SOLN
INTRAMUSCULAR | Status: DC | PRN
  Administered 2012-01-11 (×3): 50 mg via INTRAVENOUS

## 2012-01-11 MED ORDER — STERILE WATER FOR IRRIGATION IR SOLN
Status: DC | PRN
  Administered 2012-01-11: 13:00:00

## 2012-01-11 MED ORDER — MIDAZOLAM HCL 5 MG/5ML IJ SOLN
INTRAMUSCULAR | Status: DC | PRN
  Administered 2012-01-11 (×3): 2 mg via INTRAVENOUS

## 2012-01-11 MED ORDER — MEPERIDINE HCL 100 MG/ML IJ SOLN
INTRAMUSCULAR | Status: AC
  Filled 2012-01-11: qty 2

## 2012-01-11 MED ORDER — MIDAZOLAM HCL 5 MG/5ML IJ SOLN
INTRAMUSCULAR | Status: AC
  Filled 2012-01-11: qty 10

## 2012-01-11 MED ORDER — SODIUM CHLORIDE 0.45 % IV SOLN
INTRAVENOUS | Status: DC
  Administered 2012-01-11: 13:00:00 via INTRAVENOUS

## 2012-01-11 NOTE — H&P (View-Only) (Signed)
Primary Care Physician:  Kirk Ruths, MD Primary Gastroenterologist:  Dr. Jena Gauss   Chief Complaint  Patient presents with  . Colonoscopy    HPI:   66 year old male presenting today for an office visit prior to surveillance colonoscopy due to hx of colonic adenomas. TCS 2007 without polyps. Notes rare lower abdominal discomfort, usually associated with loose stools then resolves. Chronic. Sometimes constipated, will take MOM with good results. Notes low-volume hematochezia intermittently, hx of internal hemorrhoids. No loss of appetite or weight loss. Told triglycerides were high. Attempting to change eating habits. Wants to lose weight. No nausea or vomiting. No reflux, indigestion, dysphagia.   Past Medical History  Diagnosis Date  . Peripheral vascular disease   . Arthritis   . Cancer     skin  . Adenomatous polyps     remote past, on 5 year surveillance    Past Surgical History  Procedure Date  . Spine surgery     lumbar & cervical spine surgeries X 8   . Iliac artery stent 2006    Left CIA stenting  . Colonoscopy 07/30/2005    Internal hemorrhoids; otherwise, normal rectum/ Left-sided diverticula    Current Outpatient Prescriptions  Medication Sig Dispense Refill  . amLODipine (NORVASC) 10 MG tablet Take 10 mg by mouth Daily.      . diazepam (VALIUM) 10 MG tablet Take 10 mg by mouth every 6 (six) hours as needed.      . furosemide (LASIX) 20 MG tablet Take 20 mg by mouth as needed.      . gabapentin (NEURONTIN) 600 MG tablet Take 600 mg by mouth 3 (three) times daily.      Marland Kitchen lisinopril (PRINIVIL,ZESTRIL) 20 MG tablet Take 20 mg by mouth Daily.      Marland Kitchen oxyCODONE-acetaminophen (PERCOCET) 10-325 MG per tablet Take 1 tablet by mouth every 4 (four) hours as needed.      . Celecoxib (CELEBREX PO) Take by mouth.      Marland Kitchen lisinopril-hydrochlorothiazide (PRINZIDE,ZESTORETIC) 20-25 MG per tablet Take 20-25 mg by mouth Daily.        Allergies as of 12/15/2011 - Review Complete  12/15/2011  Allergen Reaction Noted  . Oxycodone Nausea Only 09/15/2011    Family History  Problem Relation Age of Onset  . Heart disease Mother   . Heart attack Father   . Heart disease Father     Heart Disease before age 10  . Colon cancer Neg Hx     History   Social History  . Marital Status: Married    Spouse Name: N/A    Number of Children: N/A  . Years of Education: N/A   Occupational History  . disability    Social History Main Topics  . Smoking status: Current Everyday Smoker -- 1.5 packs/day  . Smokeless tobacco: Not on file  . Alcohol Use: No  . Drug Use: No  . Sexually Active: Not on file   Other Topics Concern  . Not on file   Social History Narrative  . No narrative on file    Review of Systems: Gen: Denies any fever, chills, loss of appetite, fatigue, weight loss. CV: Denies chest pain, heart palpitations, syncope, peripheral edema. Resp: Denies shortness of breath with rest, cough, wheezing GI: Denies dysphagia or odynophagia. Denies hematemesis, fecal incontinence, or jaundice.  GU : Denies urinary burning, urinary frequency, urinary incontinence.  MS: Denies joint pain, muscle weakness, cramps, limited movement Derm: Denies rash, itching, dry skin Psych: Denies depression, anxiety,  confusion or memory loss  Heme: Denies bruising, bleeding, and enlarged lymph nodes.  Physical Exam: BP 151/84  Pulse 109  Temp 97.4 F (36.3 C) (Temporal)  Ht 5\' 9"  (1.753 m)  Wt 189 lb 3.2 oz (85.821 kg)  BMI 27.94 kg/m2 General:   Alert and oriented. Well-developed, well-nourished, pleasant and cooperative. Head:  Normocephalic and atraumatic. Eyes:  Conjunctiva pink, sclera clear, no icterus.    Ears:  Normal auditory acuity. Nose:  No deformity, discharge,  or lesions. Mouth:  No deformity or lesions, mucosa pink and moist.  Neck:  Supple, without mass or thyromegaly. Lungs:  Mild expiratory wheeze bilaterally, posterior upper lobes Heart:  S1, S2  present without murmurs noted.  Abdomen:  +BS, soft, non-tender and non-distended. Without mass or hernias. Questionable liver edge noted 2 finger-breadths below right costal margin. Rectal:  Deferred  Msk:  Symmetrical without gross deformities. Normal posture. Extremities:  Without clubbing or edema. Neurologic:  Alert and  oriented x4;  grossly normal neurologically. Skin:  Intact, warm and dry without significant lesions or rashes Cervical Nodes:  No significant cervical adenopathy. Psych:  Alert and cooperative. Normal mood and affect.

## 2012-01-11 NOTE — Interval H&P Note (Signed)
History and Physical Interval Note:  01/11/2012 1:12 PM  Derek [name]  has presented today for surgery, with the diagnosis of ADENOMATOUS POLYPS, HEPATOMEGALY  The various methods of treatment have been discussed with the patient and family. After consideration of risks, benefits and other options for treatment, the patient has consented to  Procedure(s) (LRB) with comments: COLONOSCOPY (N/A) - 1:15 as a surgical intervention .  The patient's history has been reviewed, patient examined, no change in status, stable for surgery.  I have reviewed the patient's chart and labs.  Questions were answered to the patient's satisfaction.     Eula Listen

## 2012-01-11 NOTE — Op Note (Signed)
[facility] [address] [place], [address]   COLONOSCOPY PROCEDURE REPORT  PATIENT: [name], [name]  MR#:         [id number] BIRTHDATE: 1946/02/13 , 66  yrs. old GENDER: Male ENDOSCOPIST: R.  Roetta Sessions, MD FACP FACG REFERRED BY:  Karleen Hampshire, M.D. PROCEDURE DATE:  01/11/2012 PROCEDURE:     colonoscopy with snare polypectomy  INDICATIONS: surveillance exam since; history of colonic adenoma  INFORMED CONSENT:  The risks, benefits, alternatives and imponderables including but not limited to bleeding, perforation as well as the possibility of a missed lesion have been reviewed.  The potential for biopsy, lesion removal, etc. have also been discussed.  Questions have been answered.  All parties agreeable. Please see the history and physical in the medical record for more information.  MEDICATIONS: Versed 6 mg IV and Demerol 150 mg IV in divided doses.  DESCRIPTION OF PROCEDURE:  After a digital rectal exam was performed, the EC-[id number] ([id number])  colonoscope was advanced from the anus through the rectum and colon to the area of the cecum, ileocecal valve and appendiceal orifice.  The cecum was deeply intubated.  These structures were well-seen and photographed for the record.  From the level of the cecum and ileocecal valve, the scope was slowly and cautiously withdrawn.  The mucosal surfaces were carefully surveyed utilizing scope tip deflection to facilitate fold flattening as needed.  The scope was pulled down into the rectum where a thorough examination including retroflexion was performed.    FINDINGS:  marginal preparation. Anal papilla; otherwise normal rectum. Sigmoid diverticula; (2) 8-9 mm flat polyps in the mid ascending segment; otherwise, the colonic mucosa appeared grossly normal in the setting of a poor prep.  THERAPEUTIC / DIAGNOSTIC MANEUVERS PERFORMED:  the 2 above-mentioned polyps were hot snare removed and recovered for the  pathologist.  COMPLICATIONS: none  CECAL WITHDRAWAL TIME:  12 minutes  IMPRESSION:  colonic polyps-removed as described above. Colonic diverticulosis. Marginal to poor prep  RECOMMENDATIONS: Followup on pathology. Further recommendations to follow.   _______________________________ eSigned:  R. Roetta Sessions, MD FACP [facility] 01/11/2012 2:02 PM   CC:    PATIENT NAME:  Derek [name], Derek [name] MR#: [id number]

## 2012-01-14 ENCOUNTER — Encounter: Payer: Self-pay | Admitting: *Deleted

## 2012-01-14 ENCOUNTER — Encounter: Payer: Self-pay | Admitting: Internal Medicine

## 2012-01-15 ENCOUNTER — Encounter (HOSPITAL_COMMUNITY): Payer: Self-pay | Admitting: Internal Medicine

## 2012-03-03 ENCOUNTER — Other Ambulatory Visit: Payer: Self-pay | Admitting: Neurosurgery

## 2012-03-03 DIAGNOSIS — M5410 Radiculopathy, site unspecified: Secondary | ICD-10-CM

## 2012-03-03 DIAGNOSIS — M549 Dorsalgia, unspecified: Secondary | ICD-10-CM

## 2012-03-10 ENCOUNTER — Ambulatory Visit
Admission: RE | Admit: 2012-03-10 | Discharge: 2012-03-10 | Disposition: A | Discharge status: Home / Self Care | Payer: Medicare Other | Source: Ambulatory Visit | Attending: Neurosurgery | Admitting: Neurosurgery

## 2012-03-10 VITALS — BP 94/45 | HR 79 | Ht 69.0 in

## 2012-03-10 DIAGNOSIS — M5410 Radiculopathy, site unspecified: Secondary | ICD-10-CM

## 2012-03-10 DIAGNOSIS — M549 Dorsalgia, unspecified: Secondary | ICD-10-CM

## 2012-03-10 MED ORDER — DIAZEPAM 5 MG PO TABS
5.0000 mg | ORAL_TABLET | Freq: Once | ORAL | Status: AC
  Administered 2012-03-10: 5 mg via ORAL

## 2012-03-10 MED ORDER — IOHEXOL 180 MG/ML  SOLN
16.0000 mL | Freq: Once | INTRAMUSCULAR | Status: AC | PRN
  Administered 2012-03-10: 16 mL via INTRATHECAL

## 2012-03-10 NOTE — Progress Notes (Signed)
C/o some back pain and leg pain, but is very sleepy at present, skin is warm and dry.dd

## 2012-06-29 ENCOUNTER — Other Ambulatory Visit: Payer: Medicare Other

## 2012-06-30 ENCOUNTER — Other Ambulatory Visit (INDEPENDENT_AMBULATORY_CARE_PROVIDER_SITE_OTHER): Payer: Medicare Other | Admitting: *Deleted

## 2012-06-30 ENCOUNTER — Encounter (INDEPENDENT_AMBULATORY_CARE_PROVIDER_SITE_OTHER): Payer: Medicare Other | Admitting: *Deleted

## 2012-06-30 ENCOUNTER — Ambulatory Visit: Payer: Medicare Other | Admitting: Neurosurgery

## 2012-06-30 DIAGNOSIS — I70219 Atherosclerosis of native arteries of extremities with intermittent claudication, unspecified extremity: Secondary | ICD-10-CM

## 2012-07-01 ENCOUNTER — Other Ambulatory Visit: Payer: Self-pay | Admitting: *Deleted

## 2012-07-01 DIAGNOSIS — Z48812 Encounter for surgical aftercare following surgery on the circulatory system: Secondary | ICD-10-CM

## 2012-07-04 ENCOUNTER — Encounter: Payer: Self-pay | Admitting: Vascular Surgery

## 2012-12-05 ENCOUNTER — Ambulatory Visit (HOSPITAL_COMMUNITY)
Admission: RE | Admit: 2012-12-05 | Discharge: 2012-12-05 | Disposition: A | Discharge status: Home / Self Care | Payer: Medicare Other | Source: Ambulatory Visit | Attending: Internal Medicine | Admitting: Internal Medicine

## 2012-12-05 ENCOUNTER — Other Ambulatory Visit (HOSPITAL_COMMUNITY): Payer: Self-pay | Admitting: Internal Medicine

## 2012-12-05 DIAGNOSIS — Z0000 Encounter for general adult medical examination without abnormal findings: Secondary | ICD-10-CM

## 2012-12-05 DIAGNOSIS — J4489 Other specified chronic obstructive pulmonary disease: Secondary | ICD-10-CM | POA: Insufficient documentation

## 2012-12-05 DIAGNOSIS — J449 Chronic obstructive pulmonary disease, unspecified: Secondary | ICD-10-CM | POA: Insufficient documentation

## 2012-12-05 DIAGNOSIS — R0609 Other forms of dyspnea: Secondary | ICD-10-CM

## 2012-12-05 DIAGNOSIS — M47814 Spondylosis without myelopathy or radiculopathy, thoracic region: Secondary | ICD-10-CM | POA: Insufficient documentation

## 2013-07-03 ENCOUNTER — Encounter: Payer: Self-pay | Admitting: Family

## 2013-07-04 ENCOUNTER — Ambulatory Visit (INDEPENDENT_AMBULATORY_CARE_PROVIDER_SITE_OTHER)
Admission: RE | Admit: 2013-07-04 | Discharge: 2013-07-04 | Disposition: A | Discharge status: Home / Self Care | Payer: Medicare Other | Source: Ambulatory Visit | Attending: Family | Admitting: Family

## 2013-07-04 ENCOUNTER — Encounter: Payer: Self-pay | Admitting: Family

## 2013-07-04 ENCOUNTER — Ambulatory Visit (INDEPENDENT_AMBULATORY_CARE_PROVIDER_SITE_OTHER): Payer: Medicare Other | Admitting: Family

## 2013-07-04 ENCOUNTER — Ambulatory Visit (HOSPITAL_COMMUNITY)
Admission: RE | Admit: 2013-07-04 | Discharge: 2013-07-04 | Disposition: A | Discharge status: Home / Self Care | Payer: Medicare Other | Source: Ambulatory Visit | Attending: Family | Admitting: Family

## 2013-07-04 ENCOUNTER — Other Ambulatory Visit: Payer: Self-pay | Admitting: Vascular Surgery

## 2013-07-04 VITALS — BP 132/78 | HR 92 | Temp 98.2°F | Resp 16 | Ht 71.0 in | Wt 202.0 lb

## 2013-07-04 DIAGNOSIS — I739 Peripheral vascular disease, unspecified: Secondary | ICD-10-CM

## 2013-07-04 DIAGNOSIS — Z48812 Encounter for surgical aftercare following surgery on the circulatory system: Secondary | ICD-10-CM

## 2013-07-04 DIAGNOSIS — I771 Stricture of artery: Secondary | ICD-10-CM

## 2013-07-04 NOTE — Progress Notes (Signed)
[facility] [place] HISTORY AND PHYSICAL -PAD  History of Present Illness Derek [name] is a 68 y.o. male patient of Dr. Donnetta Hutching with a history of left CIA stent x2 in June 2006. Patient's back today for ABIs and evaluation of his stent graft which appear to be patent bilaterally. Patient has complaints of lateral pelvis/hip pain that radiates into his groin. Patient states he seen several orthopedists and gotten mixed diagnoses, he's been told he needs hip surgery of one type or other versus another physician saying he does not need a hip replacement. The patient also has a history with Dr. Arnoldo Morale of having lumbar fusion. Patient reports no signs or symptoms of claudication with ambulation just hip pain. He was advised that he needs right hip surgery to address the bilateral radiculopathy and low back pain with walking, and right groin pain when sitting. He is hesitating to have the surgery done as his wife told him he could get blood clots. Explained to pt that anticoagulation is used to help prevent this and this will be better explained to him by his orthopedic surgeon.  He denies non-healing wounds. He denies history of MI or stroke or TIA.  The patient denies New Medical or Surgical History.  Pt Diabetic: No Pt smoker: smoker  (1.5 ppd x since age 8 yrs)  Pt meds include: Statin :No, states his cholesterol is good but his triglycerides are high ASA: No, sates it makes his stomach bleed Other anticoagulants/antiplatelets: no  Past Medical History  Diagnosis Date  . Peripheral vascular disease   . Arthritis   . Cancer     skin  . Adenomatous polyps     remote past, on 5 year surveillance    Social History History  Substance Use Topics  . Smoking status: Current Every Day Smoker -- 1.50 packs/day for 52 years    Types: Cigarettes  . Smokeless tobacco: Never Used  . Alcohol Use: No    Family History Family History  Problem Relation Age of Onset  .  Heart disease Mother   . Heart attack Father   . Heart disease Father     Heart Disease before age 18  . Colon cancer Neg Hx     Past Surgical History  Procedure Laterality Date  . Spine surgery      lumbar & cervical spine surgeries X 8   . Iliac artery stent  2006    Left CIA stenting  . Colonoscopy  07/30/2005    Internal hemorrhoids; otherwise, normal rectum/ Left-sided diverticula  . Colonoscopy  01/11/2012    Procedure: COLONOSCOPY;  Surgeon: Daneil Dolin, MD;  Location: [facility];  Service: Endoscopy;  Laterality: N/A;  1:15    Allergies  Allergen Reactions  . Oxycodone Nausea Only    High doses    Current Outpatient Prescriptions  Medication Sig Dispense Refill  . amLODipine (NORVASC) 10 MG tablet Take 10 mg by mouth Daily.      . diazepam (VALIUM) 10 MG tablet Take 10 mg by mouth every 6 (six) hours as needed. Anxiety      . furosemide (LASIX) 20 MG tablet Take 20 mg by mouth daily as needed. Fluid Retention      . gabapentin (NEURONTIN) 600 MG tablet Take 600 mg by mouth 3 (three) times daily.      Marland Kitchen guaiFENesin (MUCINEX) 600 MG 12 hr tablet Take 1,200 mg by mouth 2 (two) times daily as needed. Congestion      .  lisinopril (PRINIVIL,ZESTRIL) 20 MG tablet Take 20 mg by mouth Daily.      . Omega-3 Fatty Acids (FISH OIL) 1200 MG CAPS Take 1,200 mg by mouth 3 (three) times daily.      Marland Kitchen oxyCODONE-acetaminophen (PERCOCET) 10-325 MG per tablet Take 1 tablet by mouth every 4 (four) hours as needed.      . vitamin E 400 UNIT capsule Take 400 Units by mouth daily.       No current facility-administered medications for this visit.    ROS: See HPI for pertinent positives and negatives.   Physical Examination  Filed Vitals:   07/04/13 0940  BP: 132/78  Pulse: 92  Temp: 98.2 F (36.8 C)  Resp: 16   Filed Weights   07/04/13 0940  Weight: 202 lb (91.627 kg)   Body mass index is 28.19 kg/(m^2).  General: A&O x 3, WDWN. Gait: normal Eyes: PERRLA. Pulmonary:  CTAB, without wheezes , rales or rhonchi. Cardiac: regular Rythm , without detected murmur.         Carotid Bruits Left Right   Negative Negative  Aorta is not palpable. Radial pulses: 2+ palpable and =                           VASCULAR EXAM: Extremities without ischemic changes  without Gangrene; without open wounds.                                                                                                          LE Pulses LEFT RIGHT       FEMORAL   palpable   palpable        POPLITEAL  not palpable   not palpable       POSTERIOR TIBIAL  not palpable   not palpable        DORSALIS PEDIS      ANTERIOR TIBIAL  palpable   palpable    Abdomen: soft, NT, no masses. Skin: no rashes, no ulcers noted. Musculoskeletal: no muscle wasting or atrophy.  Neurologic: A&O X 3; Appropriate Affect ; SENSATION: normal; MOTOR FUNCTION:  moving all extremities equally, motor strength 5/5 in arms, 4/5 in legs. Speech is fluent/normal. CN 2-12 intact.    Non-Invasive Vascular Imaging: DATE: 07/04/2013 ILIAC ARTERY STENT EVALUATION    INDICATION: Left common iliac artery stent 2006    PREVIOUS INTERVENTION(S):     DUPLEX EXAM:     RIGHT  LEFT   Peak Systolic Velocity (cm/s) Ratio (if abnormal) Waveform  Peak Systolic Velocity (cm/s) Ratio (if abnormal) Waveform     Aorta - Distal 147  T     Artery - Proximal to Stent        Stent - Proximal 127  T     Stent - Mid 183  T     Stent - Distal 150  T     Artery - Distal to Stent 280 1.9 T  1.02 Today's ABI / TBI .98  1.14 Previous ABI / TBI (06/30/2012  )  1.06    Waveform:    M - Monophasic       B - Biphasic       T - Triphasic  If Ankle Brachial Index (ABI) or Toe Brachial Index (TBI) performed, please see complete report     ADDITIONAL FINDINGS:     IMPRESSION: 1. Patent left common iliac artery stent with increased velocities and mild (<50%) disease observed at the stent exit/ immediate outflow.  2. Elevated velocities are  present in the aorta; no significant stenosis is identified.  3.  4.   ASSESSMENT: 5. Derek [name] is a 68 y.o. male who is s/p Left common iliac artery stent 2006 and presents with patent left common iliac artery stent with increased velocities and mild (<50%) disease observed at the stent exit/ immediate outflow.  Elevated velocities are present in the aorta; no significant stenosis is identified.  PLAN:  Patient was counseled re smoking cessation. I discussed in depth with the patient the nature of atherosclerosis, and emphasized the importance of maximal medical management including strict control of blood pressure, blood glucose, and lipid levels, obtaining regular exercise, and cessation of smoking.  The patient is aware that without maximal medical management the underlying atherosclerotic disease process will progress, limiting the benefit of any interventions.  Based on the patient's vascular studies and examination, pt will return to clinic in 1 year for ABI's and left iliac artery stent Duplex.  The patient was given information about PAD including signs, symptoms, treatment, what symptoms should prompt the patient to seek immediate medical care, and risk reduction measures to take.  Clemon Chambers, RN, MSN, FNP-C [facility] Phone: [phone]  Clinic MD: Early  07/04/2013 10:10 AM

## 2013-07-04 NOTE — Patient Instructions (Addendum)
Peripheral Vascular Disease Peripheral Vascular Disease (PVD), also called Peripheral Arterial Disease (PAD), is a circulation problem caused by cholesterol (atherosclerotic plaque) deposits in the arteries. PVD commonly occurs in the lower extremities (legs) but it can occur in other areas of the body, such as your arms. The cholesterol buildup in the arteries reduces blood flow which can cause pain and other serious problems. The presence of PVD can place a person at risk for Coronary Artery Disease (CAD).  CAUSES  Causes of PVD can be many. It is usually associated with more than one risk factor such as:   High Cholesterol.  Smoking.  Diabetes.  Lack of exercise or inactivity.  High blood pressure (hypertension).  Obesity.  Family history. SYMPTOMS   When the lower extremities are affected, patients with PVD may experience:  Leg pain with exertion or physical activity. This is called INTERMITTENT CLAUDICATION. This may present as cramping or numbness with physical activity. The location of the pain is associated with the level of blockage. For example, blockage at the abdominal level (distal abdominal aorta) may result in buttock or hip pain. Lower leg arterial blockage may result in calf pain.  As PVD becomes more severe, pain can develop with less physical activity.  In people with severe PVD, leg pain may occur at rest.  Other PVD signs and symptoms:  Leg numbness or weakness.  Coldness in the affected leg or foot, especially when compared to the other leg.  A change in leg color.  Patients with significant PVD are more prone to ulcers or sores on toes, feet or legs. These may take longer to heal or may reoccur. The ulcers or sores can become infected.  If signs and symptoms of PVD are ignored, gangrene may occur. This can result in the loss of toes or loss of an entire limb.  Not all leg pain is related to PVD. Other medical conditions can cause leg pain such  as:  Blood clots (embolism) or Deep Vein Thrombosis.  Inflammation of the blood vessels (vasculitis).  Spinal stenosis. DIAGNOSIS  Diagnosis of PVD can involve several different types of tests. These can include:  Pulse Volume Recording Method (PVR). This test is simple, painless and does not involve the use of X-rays. PVR involves measuring and comparing the blood pressure in the arms and legs. An ABI (Ankle-Brachial Index) is calculated. The normal ratio of blood pressures is 1. As this number becomes smaller, it indicates more severe disease.  < 0.95  indicates significant narrowing in one or more leg vessels.  <0.8 there will usually be pain in the foot, leg or buttock with exercise.  <0.4 will usually have pain in the legs at rest.  <0.25  usually indicates limb threatening PVD.  Doppler detection of pulses in the legs. This test is painless and checks to see if you have a pulses in your legs/feet.  A dye or contrast material (a substance that highlights the blood vessels so they show up on x-ray) may be given to help your caregiver better see the arteries for the following tests. The dye is eliminated from your body by the kidney's. Your caregiver may order blood work to check your kidney function and other laboratory values before the following tests are performed:  Magnetic Resonance Angiography (MRA). An MRA is a picture study of the blood vessels and arteries. The MRA machine uses a large magnet to produce images of the blood vessels.  Computed Tomography Angiography (CTA). A CTA is a   specialized x-ray that looks at how the blood flows in your blood vessels. An IV may be inserted into your arm so contrast dye can be injected.  Angiogram. Is a procedure that uses x-rays to look at your blood vessels. This procedure is minimally invasive, meaning a small incision (cut) is made in your groin. A small tube (catheter) is then inserted into the artery of your groin. The catheter is  guided to the blood vessel or artery your caregiver wants to examine. Contrast dye is injected into the catheter. X-rays are then taken of the blood vessel or artery. After the images are obtained, the catheter is taken out. TREATMENT  Treatment of PVD involves many interventions which may include:  Lifestyle changes:  Quitting smoking.  Exercise.  Following a low fat, low cholesterol diet.  Control of diabetes.  Foot care is very important to the PVD patient. Good foot care can help prevent infection.  Medication:  Cholesterol-lowering medicine.  Blood pressure medicine.  Anti-platelet drugs.  Certain medicines may reduce symptoms of Intermittent Claudication.  Interventional/Surgical options:  Angioplasty. An Angioplasty is a procedure that inflates a balloon in the blocked artery. This opens the blocked artery to improve blood flow.  Stent Implant. A wire mesh tube (stent) is placed in the artery. The stent expands and stays in place, allowing the artery to remain open.  Peripheral Bypass Surgery. This is a surgical procedure that reroutes the blood around a blocked artery to help improve blood flow. This type of procedure may be performed if Angioplasty or stent implants are not an option. SEEK IMMEDIATE MEDICAL CARE IF:   You develop pain or numbness in your arms or legs.  Your arm or leg turns cold, becomes blue in color.  You develop redness, warmth, swelling and pain in your arms or legs. MAKE SURE YOU:   Understand these instructions.  Will watch your condition.  Will get help right away if you are not doing well or get worse. Document Released: 05/14/2004 Document Revised: 06/29/2011 Document Reviewed: 04/10/2008 ExitCare Patient Information 2014 [employer].   Smoking Cessation Quitting smoking is important to your health and has many advantages. However, it is not always easy to quit since nicotine is a very addictive drug. Often times, people try 3  times or more before being able to quit. This document explains the best ways for you to prepare to quit smoking. Quitting takes hard work and a lot of effort, but you can do it. ADVANTAGES OF QUITTING SMOKING  You will live longer, feel better, and live better.  Your body will feel the impact of quitting smoking almost immediately.  Within 20 minutes, blood pressure decreases. Your pulse returns to its normal level.  After 8 hours, carbon monoxide levels in the blood return to normal. Your oxygen level increases.  After 24 hours, the chance of having a heart attack starts to decrease. Your breath, hair, and body stop smelling like smoke.  After 48 hours, damaged nerve endings begin to recover. Your sense of taste and smell improve.  After 72 hours, the body is virtually free of nicotine. Your bronchial tubes relax and breathing becomes easier.  After 2 to 12 weeks, lungs can hold more air. Exercise becomes easier and circulation improves.  The risk of having a heart attack, stroke, cancer, or lung disease is greatly reduced.  After 1 year, the risk of coronary heart disease is cut in half.  After 5 years, the risk of stroke falls to   the same as a nonsmoker.  After 10 years, the risk of lung cancer is cut in half and the risk of other cancers decreases significantly.  After 15 years, the risk of coronary heart disease drops, usually to the level of a nonsmoker.  If you are pregnant, quitting smoking will improve your chances of having a healthy baby.  The people you live with, especially any children, will be healthier.  You will have extra money to spend on things other than cigarettes. QUESTIONS TO THINK ABOUT BEFORE ATTEMPTING TO QUIT You may want to talk about your answers with your caregiver.  Why do you want to quit?  If you tried to quit in the past, what helped and what did not?  What will be the most difficult situations for you after you quit? How will you plan to  handle them?  Who can help you through the tough times? Your family? Friends? A caregiver?  What pleasures do you get from smoking? What ways can you still get pleasure if you quit? Here are some questions to ask your caregiver:  How can you help me to be successful at quitting?  What medicine do you think would be best for me and how should I take it?  What should I do if I need more help?  What is smoking withdrawal like? How can I get information on withdrawal? GET READY  Set a quit date.  Change your environment by getting rid of all cigarettes, ashtrays, matches, and lighters in your home, car, or work. Do not let people smoke in your home.  Review your past attempts to quit. Think about what worked and what did not. GET SUPPORT AND ENCOURAGEMENT You have a better chance of being successful if you have help. You can get support in many ways.  Tell your family, friends, and co-workers that you are going to quit and need their support. Ask them not to smoke around you.  Get individual, group, or telephone counseling and support. Programs are available at local hospitals and health centers. Call your local health department for information about programs in your area.  Spiritual beliefs and practices may help some smokers quit.  Download a "quit meter" on your computer to keep track of quit statistics, such as how long you have gone without smoking, cigarettes not smoked, and money saved.  Get a self-help book about quitting smoking and staying off of tobacco. LEARN NEW SKILLS AND BEHAVIORS  Distract yourself from urges to smoke. Talk to someone, go for a walk, or occupy your time with a task.  Change your normal routine. Take a different route to work. Drink tea instead of coffee. Eat breakfast in a different place.  Reduce your stress. Take a hot bath, exercise, or read a book.  Plan something enjoyable to do every day. Reward yourself for not smoking.  Explore  interactive web-based programs that specialize in helping you quit. GET MEDICINE AND USE IT CORRECTLY Medicines can help you stop smoking and decrease the urge to smoke. Combining medicine with the above behavioral methods and support can greatly increase your chances of successfully quitting smoking.  Nicotine replacement therapy helps deliver nicotine to your body without the negative effects and risks of smoking. Nicotine replacement therapy includes nicotine gum, lozenges, inhalers, nasal sprays, and skin patches. Some may be available over-the-counter and others require a prescription.  Antidepressant medicine helps people abstain from smoking, but how this works is unknown. This medicine is available by prescription.    Nicotinic receptor partial agonist medicine simulates the effect of nicotine in your brain. This medicine is available by prescription. Ask your caregiver for advice about which medicines to use and how to use them based on your health history. Your caregiver will tell you what side effects to look out for if you choose to be on a medicine or therapy. Carefully read the information on the package. Do not use any other product containing nicotine while using a nicotine replacement product.  RELAPSE OR DIFFICULT SITUATIONS Most relapses occur within the first 3 months after quitting. Do not be discouraged if you start smoking again. Remember, most people try several times before finally quitting. You may have symptoms of withdrawal because your body is used to nicotine. You may crave cigarettes, be irritable, feel very hungry, cough often, get headaches, or have difficulty concentrating. The withdrawal symptoms are only temporary. They are strongest when you first quit, but they will go away within 10 14 days. To reduce the chances of relapse, try to:  Avoid drinking alcohol. Drinking lowers your chances of successfully quitting.  Reduce the amount of caffeine you consume. Once you  quit smoking, the amount of caffeine in your body increases and can give you symptoms, such as a rapid heartbeat, sweating, and anxiety.  Avoid smokers because they can make you want to smoke.  Do not let weight gain distract you. Many smokers will gain weight when they quit, usually less than 10 pounds. Eat a healthy diet and stay active. You can always lose the weight gained after you quit.  Find ways to improve your mood other than smoking. FOR MORE INFORMATION  www.smokefree.gov  Document Released: 03/31/2001 Document Revised: 10/06/2011 Document Reviewed: 07/16/2011 ExitCare Patient Information 2014 [employer].  

## 2013-07-05 NOTE — Addendum Note (Signed)
Addended by: Mena Goes on: 07/05/2013 10:44 AM   Modules accepted: Orders

## 2013-08-15 ENCOUNTER — Emergency Department (HOSPITAL_COMMUNITY)
Admission: EM | Admit: 2013-08-15 | Discharge: 2013-08-15 | Disposition: A | Discharge status: Home / Self Care | Payer: Medicare Other | Attending: Emergency Medicine | Admitting: Emergency Medicine

## 2013-08-15 ENCOUNTER — Other Ambulatory Visit (HOSPITAL_COMMUNITY): Payer: Self-pay | Admitting: Family Medicine

## 2013-08-15 ENCOUNTER — Encounter (HOSPITAL_COMMUNITY): Payer: Self-pay | Admitting: Emergency Medicine

## 2013-08-15 ENCOUNTER — Ambulatory Visit (HOSPITAL_COMMUNITY)
Admission: RE | Admit: 2013-08-15 | Discharge: 2013-08-15 | Disposition: A | Discharge status: Home / Self Care | Payer: Medicare Other | Source: Ambulatory Visit | Attending: Family Medicine | Admitting: Family Medicine

## 2013-08-15 DIAGNOSIS — Z85828 Personal history of other malignant neoplasm of skin: Secondary | ICD-10-CM | POA: Insufficient documentation

## 2013-08-15 DIAGNOSIS — J329 Chronic sinusitis, unspecified: Secondary | ICD-10-CM

## 2013-08-15 DIAGNOSIS — Z8679 Personal history of other diseases of the circulatory system: Secondary | ICD-10-CM | POA: Insufficient documentation

## 2013-08-15 DIAGNOSIS — Z791 Long term (current) use of non-steroidal anti-inflammatories (NSAID): Secondary | ICD-10-CM | POA: Insufficient documentation

## 2013-08-15 DIAGNOSIS — J019 Acute sinusitis, unspecified: Secondary | ICD-10-CM

## 2013-08-15 DIAGNOSIS — H05229 Edema of unspecified orbit: Secondary | ICD-10-CM

## 2013-08-15 DIAGNOSIS — Z79899 Other long term (current) drug therapy: Secondary | ICD-10-CM | POA: Insufficient documentation

## 2013-08-15 DIAGNOSIS — Z860100 Personal history of colon polyps, unspecified: Secondary | ICD-10-CM | POA: Insufficient documentation

## 2013-08-15 DIAGNOSIS — F17200 Nicotine dependence, unspecified, uncomplicated: Secondary | ICD-10-CM | POA: Insufficient documentation

## 2013-08-15 DIAGNOSIS — M129 Arthropathy, unspecified: Secondary | ICD-10-CM | POA: Insufficient documentation

## 2013-08-15 LAB — BASIC METABOLIC PANEL
BUN: 8 mg/dL (ref 6–23)
CO2: 26 mEq/L (ref 19–32)
Chloride: 98 mEq/L (ref 96–112)
Creatinine, Ser: 0.88 mg/dL (ref 0.50–1.35)
Potassium: 5.3 mEq/L (ref 3.7–5.3)

## 2013-08-15 LAB — CBC WITH DIFFERENTIAL/PLATELET
Basophils Absolute: 0.1 K/uL (ref 0.0–0.1)
Basophils Relative: 1 % (ref 0–1)
Eosinophils Absolute: 0.2 K/uL (ref 0.0–0.7)
Eosinophils Relative: 2 % (ref 0–5)
HCT: 45.1 % (ref 39.0–52.0)
Hemoglobin: 14.9 g/dL (ref 13.0–17.0)
Lymphocytes Relative: 24 % (ref 12–46)
Lymphs Abs: 2.2 K/uL (ref 0.7–4.0)
MCH: 31.5 pg (ref 26.0–34.0)
MCHC: 33 g/dL (ref 30.0–36.0)
MCV: 95.3 fL (ref 78.0–100.0)
Monocytes Absolute: 0.8 10*3/uL (ref 0.1–1.0)
Monocytes Relative: 9 % (ref 3–12)
Neutro Abs: 6 10*3/uL (ref 1.7–7.7)
Neutrophils Relative %: 64 % (ref 43–77)
Platelets: 340 K/uL (ref 150–400)
RBC: 4.73 MIL/uL (ref 4.22–5.81)
RDW: 13 % (ref 11.5–15.5)
WBC: 9.2 10*3/uL (ref 4.0–10.5)

## 2013-08-15 LAB — BASIC METABOLIC PANEL WITH GFR
Calcium: 9.9 mg/dL (ref 8.4–10.5)
GFR calc Af Amer: >90 mL/min (ref >90)
GFR calc non Af Amer: 88 mL/min — ABNORMAL LOW (ref >90)
Glucose, Bld: 95 mg/dL (ref 70–99)
Sodium: 136 meq/L — ABNORMAL LOW (ref 137–147)

## 2013-08-15 MED ORDER — DIPHENHYDRAMINE HCL 50 MG/ML IJ SOLN
25.0000 mg | Freq: Once | INTRAMUSCULAR | Status: AC
  Administered 2013-08-15: 25 mg via INTRAVENOUS
  Filled 2013-08-15: qty 1

## 2013-08-15 MED ORDER — METOCLOPRAMIDE HCL 5 MG/ML IJ SOLN
5.0000 mg | Freq: Once | INTRAMUSCULAR | Status: AC
  Administered 2013-08-15: 5 mg via INTRAVENOUS
  Filled 2013-08-15: qty 2

## 2013-08-15 MED ORDER — LORAZEPAM 2 MG/ML IJ SOLN
1.0000 mg | Freq: Once | INTRAMUSCULAR | Status: AC
Start: 2013-08-15 — End: 2013-08-15
  Administered 2013-08-15: 1 mg via INTRAVENOUS
  Filled 2013-08-15: qty 1

## 2013-08-15 MED ORDER — PIPERACILLIN-TAZOBACTAM 3.375 G IVPB 30 MIN
3.3750 g | Freq: Once | INTRAVENOUS | Status: AC
  Administered 2013-08-15: 3.375 g via INTRAVENOUS
  Filled 2013-08-15: qty 50

## 2013-08-15 MED ORDER — FENTANYL CITRATE 0.05 MG/ML IJ SOLN
50.0000 ug | Freq: Once | INTRAMUSCULAR | Status: AC
  Administered 2013-08-15: 50 ug via INTRAVENOUS
  Filled 2013-08-15: qty 2

## 2013-08-15 MED ORDER — HYDROCODONE-ACETAMINOPHEN 5-325 MG PO TABS: ORAL_TABLET | ORAL | Status: DC

## 2013-08-15 MED ORDER — SODIUM CHLORIDE 0.9 % IV SOLN
Freq: Once | INTRAVENOUS | Status: AC
  Administered 2013-08-15: 16:00:00 via INTRAVENOUS

## 2013-08-15 MED ORDER — HYDROMORPHONE HCL PF 1 MG/ML IJ SOLN
1.0000 mg | Freq: Once | INTRAMUSCULAR | Status: AC
  Administered 2013-08-15: 1 mg via INTRAVENOUS
  Filled 2013-08-15: qty 1

## 2013-08-15 MED ORDER — AMOXICILLIN-POT CLAVULANATE 875-125 MG PO TABS: 1.0000 | ORAL_TABLET | Freq: Two times a day (BID) | ORAL | Status: DC

## 2013-08-15 NOTE — ED Notes (Addendum)
Reports having been diagnosed with sinus infection, had ct maxilofacial done at [facility] and sent here due to spread of infection into skull. No acute distress noted at triage. Sent here to see ENT.

## 2013-08-15 NOTE — ED Notes (Signed)
Per sending physician's office, Dr Aloha Gell) has been notified that pt is coming for eval.

## 2013-08-15 NOTE — ED Notes (Signed)
Dr. Zenia Resides notified patient is ready to go home.

## 2013-08-15 NOTE — Progress Notes (Signed)
Patient ID: Derek [name], male   DOB: Mar 10, 1946, 68 y.o.   MRN: [id number] I spoke to the emergency room physician  Dr. Dorna Mai who states that  The patient was referred from [facility].  The [facility] emergency room. Patient has a history of headaches and currently is having headaches. That resulted in a CT scan performed which showed erosion of the patient's orbital area and complete opacification a left sinus. The patient is not toxic and no evidence of any intracranial effects. No vision changes. The patient will be given intravenous antibiotics and followup in the office tomorrow for consultation for discussion of surgical management of this problem.

## 2013-08-15 NOTE — ED Notes (Signed)
PT's wife states he was sent to see Dr. Barry Dienes for sinus infection. States pain started appx 3 days ago, hx of sinus infections. Pt reports 10/10 HA. Family at bedside.

## 2013-08-15 NOTE — Progress Notes (Signed)
Subjective: I spoke with the family and patient regarding his problem. He has a headache since Sunday. It is improved. He has a history of sinusitis and has been going on for years. He is not previously had pain specifically in the left side like this. The family has not noted any swelling of the eye and he has had no vision changes. He doesn't have nasal obstruction.  Objective: Vital signs in last 24 hours: Temp:  [97.4 F (36.3 C)-97.7 F (36.5 C)] 97.7 F (36.5 C) (04/28 1454) Pulse Rate:  [86-104] 87 (04/28 1715) Resp:  [18] 18 (04/28 1454) BP: (133-164)/(67-85) 133/67 mmHg (04/28 1545) SpO2:  [97 %-98 %] 97 % (04/28 1715) Weight:  [93.26 kg (205 lb 9.6 oz)] 93.26 kg (205 lb 9.6 oz) (04/28 1237)    Intake/Output from previous day:   Intake/Output this shift:    He is awake and alert. Cognitively he is doing well no changes in mentation. He is a slow speaker and the family says that has now he's been all his life. His left thigh has a slight swelling in the medial superior aspect. The orbit and globe looks intact and no swelling. No ecchymosis or chemosis. Extraocular motor muscles are intact. Nose is clear. There is no facial swelling or erythema.  Lab Results:   Recent Labs  08/15/13 1522  WBC 9.2  HGB 14.9  HCT 45.1  PLT 340   BMET  Recent Labs  08/15/13 1522  NA 136*  K 5.3  CL 98  CO2 26  GLUCOSE 95  BUN 8  CREATININE 0.88  CALCIUM 9.9   PT/INR No results found for this basename: LABPROT, INR,  in the last 72 hours ABG No results found for this basename: PHART, PCO2, PO2, HCO3,  in the last 72 hours  Studies/Results: Ct Maxillofacial Wo Cm  08/15/2013   CLINICAL DATA:  Left-sided sinus pressure and pain.  EXAM: CT MAXILLOFACIAL WITHOUT CONTRAST  TECHNIQUE: Multidetector CT imaging of the maxillofacial structures was performed. Multiplanar CT image reconstructions were also generated. A small metallic BB was placed on the right temple in order to  reliably differentiate right from left.  COMPARISON:  None.  FINDINGS: No fracture or other significant bony abnormality is noted. Globes and orbits appear normal. There is complete opacification of the left maxillary and frontal sinuses, as well as the anterior portion of the left ethmoid sinus. 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 There is expansion of the left frontal sinus with focal erosion of the anterior table. Erosion of the lamina papyracea on the left side is noted, with the inflammatory contents of the left frontal and ethmoid sinuses bulging into the superior and medial portion of the left orbit. The remaining paranasal sinuses appear normal.  IMPRESSION: Complete opacification of the left frontal and maxillary sinuses is noted, as well as involvement of the anterior cells of the left ethmoid sinus. 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 There is expansion of the left frontal sinus with focal erosion seen involving the anterior table of the frontal skull. There is a large defect involving the left lamina papyracea superiorly and anteriorly secondary to this inflammation, with the inflammatory contents of the left frontal and ethmoid sinuses bulging into the superior and medial portion of the left orbit.   Electronically Signed   By: Sabino Dick  M.D.   On: 08/15/2013 10:56    Anti-infectives: Anti-infectives   Start     Dose/Rate Route Frequency Ordered Stop   08/15/13 1600  piperacillin-tazobactam (ZOSYN) IVPB 3.375 g     3.375 g 100 mL/hr over 30 Minutes Intravenous  Once 08/15/13 1552 08/15/13 1722   08/15/13 0000  amoxicillin-clavulanate (AUGMENTIN) 875-125 MG per tablet     1 tablet Oral 2 times daily 08/15/13 1559        Assessment/Plan: s/p * No surgery found * Left-sided sinusitis-this most likely is a  mucocele that has caused erosion of the superior orbit. This will need surgical intervention to clear this out. He will come to my office tomorrow at 3:30 to discuss the surgery and it a Fusion computer guidance CT scan. He will come back sooner if he has any worsening symptoms.  LOS: 0 days    Melissa Montane 08/15/2013

## 2013-08-15 NOTE — Discharge Instructions (Signed)
Sinusitis Sinusitis is redness, soreness, and swelling (inflammation) of the paranasal sinuses. Paranasal sinuses are air pockets within the bones of your face (beneath the eyes, the middle of the forehead, or above the eyes). In healthy paranasal sinuses, mucus is able to drain out, and air is able to circulate through them by way of your nose. However, when your paranasal sinuses are inflamed, mucus and air can become trapped. This can allow bacteria and other germs to grow and cause infection. Sinusitis can develop quickly and last only a short time (acute) or continue over a long period (chronic). Sinusitis that lasts for more than 12 weeks is considered chronic.  CAUSES  Causes of sinusitis include:  Allergies.  Structural abnormalities, such as displacement of the cartilage that separates your nostrils (deviated septum), which can decrease the air flow through your nose and sinuses and affect sinus drainage.  Functional abnormalities, such as when the small hairs (cilia) that line your sinuses and help remove mucus do not work properly or are not present. SYMPTOMS  Symptoms of acute and chronic sinusitis are the same. The primary symptoms are pain and pressure around the affected sinuses. Other symptoms include:  Upper toothache.  Earache.  Headache.  Bad breath.  Decreased sense of smell and taste.  A cough, which worsens when you are lying flat.  Fatigue.  Fever.  Thick drainage from your nose, which often is green and may contain pus (purulent).  Swelling and warmth over the affected sinuses. DIAGNOSIS  Your caregiver will perform a physical exam. During the exam, your caregiver may:  Look in your nose for signs of abnormal growths in your nostrils (nasal polyps).  Tap over the affected sinus to check for signs of infection.  View the inside of your sinuses (endoscopy) with a special imaging device with a light attached (endoscope), which is inserted into your  sinuses. If your caregiver suspects that you have chronic sinusitis, one or more of the following tests may be recommended:  Allergy tests.  Nasal culture A sample of mucus is taken from your nose and sent to a lab and screened for bacteria.  Nasal cytology A sample of mucus is taken from your nose and examined by your caregiver to determine if your sinusitis is related to an allergy. TREATMENT  Most cases of acute sinusitis are related to a viral infection and will resolve on their own within 10 days. Sometimes medicines are prescribed to help relieve symptoms (pain medicine, decongestants, nasal steroid sprays, or saline sprays).  However, for sinusitis related to a bacterial infection, your caregiver will prescribe antibiotic medicines. These are medicines that will help kill the bacteria causing the infection.  Rarely, sinusitis is caused by a fungal infection. In theses cases, your caregiver will prescribe antifungal medicine. For some cases of chronic sinusitis, surgery is needed. Generally, these are cases in which sinusitis recurs more than 3 times per year, despite other treatments. HOME CARE INSTRUCTIONS   Drink plenty of water. Water helps thin the mucus so your sinuses can drain more easily.  Use a humidifier.  Inhale steam 3 to 4 times a day (for example, sit in the bathroom with the shower running).  Apply a warm, moist washcloth to your face 3 to 4 times a day, or as directed by your caregiver.  Use saline nasal sprays to help moisten and clean your sinuses.  Take over-the-counter or prescription medicines for pain, discomfort, or fever only as directed by your caregiver. [place]  CARE IF:  You have increasing pain or severe headaches.  You have nausea, vomiting, or drowsiness.  You have swelling around your face.  You have vision problems.  You have a stiff neck.  You have difficulty breathing. MAKE SURE YOU:   Understand these  instructions.  Will watch your condition.  Will get help right away if you are not doing well or get worse. Document Released: 04/06/2005 Document Revised: 06/29/2011 Document Reviewed: 04/21/2011 [facility] Patient Information 2014 [place], [place].    Narcotic and benzodiazepine use may cause drowsiness, slowed breathing or dependence.  Please use with caution and do not drive, operate machinery or watch young children alone while taking them.  Taking combinations of these medications or drinking alcohol will potentiate these effects.

## 2013-08-15 NOTE — ED Notes (Signed)
Patient getting dressed. Asked for a moment to put on gown. Family at the bedside.

## 2013-08-15 NOTE — ED Notes (Signed)
Dr. Dorna Mai at the bedside.

## 2013-08-15 NOTE — ED Provider Notes (Addendum)
CSN: [id number]     Arrival date & time 08/15/13  1231 History   First MD Initiated Contact with Patient 08/15/13 1444     Chief Complaint  Patient presents with  . Sinusitis     (Consider location/radiation/quality/duration/timing/severity/associated sxs/prior Treatment) HPI Comments: Pt with no sig h/o sinus infection, allergies, only mild per spouse.  Pt has had HA for 3 days, worse since yesterday.  No stiff neck. No confusion.  Pt seen by PCP clinic, Dr. Nancy Nordmann.  He was sent for a CT scan then seen again, found to have severe sinusitis with erosion into sinuses and skull per spouse.  He spoke to Dr. Janace Hoard with ENT and was directed to come to the ED at [facility]    Patient is a 68 y.o. male presenting with sinusitis. The history is provided by the patient, the spouse and a relative.  Sinusitis Pain details:    Location:  Frontal and maxillary   Quality:  Pressure and aching   Severity:  Severe   Duration:  3 days   Timing:  Constant Progression:  Worsening Context: not allergies and not recent URI   Associated symptoms: chills, congestion and headaches   Associated symptoms: no fever and no nausea     Past Medical History  Diagnosis Date  . Peripheral vascular disease   . Arthritis   . Cancer     skin  . Adenomatous polyps     remote past, on 5 year surveillance   Past Surgical History  Procedure Laterality Date  . Spine surgery      lumbar & cervical spine surgeries X 8   . Iliac artery stent  2006    Left CIA stenting  . Colonoscopy  07/30/2005    Internal hemorrhoids; otherwise, normal rectum/ Left-sided diverticula  . Colonoscopy  01/11/2012    Procedure: COLONOSCOPY;  Surgeon: Daneil Dolin, MD;  [facility];  Service: Endoscopy;  Laterality: N/A;  1:15   Family History  Problem Relation Age of Onset  . Heart disease Mother   . Heart attack Father   . Heart disease Father     Heart Disease before age 57  . Colon cancer Neg Hx    History   Substance Use Topics  . Smoking status: Current Every Day Smoker -- 1.50 packs/day for 52 years    Types: Cigarettes  . Smokeless tobacco: Never Used  . Alcohol Use: No    Review of Systems  Constitutional: Positive for chills. Negative for fever.  HENT: Positive for congestion and sinus pressure.   Gastrointestinal: Negative for nausea.  Neurological: Positive for headaches.  All other systems reviewed and are negative.     Allergies  Oxycodone  Home Medications   Prior to Admission medications   Medication Sig Start Date End Date Taking? Authorizing Provider  amLODipine (NORVASC) 10 MG tablet Take 10 mg by mouth Daily. 08/31/11  Yes Historical Provider, MD  cyclobenzaprine (FLEXERIL) 10 MG tablet Take 1 tablet by mouth daily as needed. 06/13/13  Yes Historical Provider, MD  diazepam (VALIUM) 10 MG tablet Take 10 mg by mouth every 6 (six) hours as needed. Anxiety   Yes Historical Provider, MD  furosemide (LASIX) 20 MG tablet Take 20 mg by mouth daily as needed. Fluid Retention 06/29/11  Yes Historical Provider, MD  gabapentin (NEURONTIN) 600 MG tablet Take 600 mg by mouth 3 (three) times daily.   Yes Historical Provider, MD  guaiFENesin (MUCINEX) 600 MG 12 hr tablet  Take 1,200 mg by mouth 2 (two) times daily as needed. Congestion   Yes Historical Provider, MD  lactulose (CHRONULAC) 10 GM/15ML solution Take 15-30 mLs by mouth daily. 07/24/13  Yes Historical Provider, MD  lisinopril (PRINIVIL,ZESTRIL) 20 MG tablet Take 20 mg by mouth Daily. 09/07/11  Yes Historical Provider, MD  naproxen sodium (ANAPROX) 220 MG tablet Take 220 mg by mouth 2 (two) times daily with a meal.   Yes Historical Provider, MD  Omega-3 Fatty Acids (FISH OIL) 1200 MG CAPS Take 1,200 mg by mouth 3 (three) times daily.   Yes Historical Provider, MD  OVER THE COUNTER MEDICATION Take 2 tablets by mouth daily as needed (sinuses).   Yes Historical Provider, MD  oxyCODONE-acetaminophen (PERCOCET) 10-325 MG per tablet  Take 1 tablet by mouth every 4 (four) hours as needed for pain.    Yes Historical Provider, MD   BP 139/85  Pulse 89  Temp(Src) 97.7 F (36.5 C) (Oral)  Resp 18  Ht 5\' 11"  (1.803 m)  Wt 205 lb 9.6 oz (93.26 kg)  BMI 28.69 kg/m2  SpO2 97% Physical Exam  Nursing note and vitals reviewed. Constitutional: He appears well-developed.  HENT:  Head: Normocephalic and atraumatic.  Nose: Mucosal edema present. Right sinus exhibits maxillary sinus tenderness and frontal sinus tenderness. Left sinus exhibits maxillary sinus tenderness and frontal sinus tenderness.  Mouth/Throat: Uvula is midline.  Eyes: Conjunctivae and EOM are normal.  Cardiovascular: Normal rate and regular rhythm.   Pulmonary/Chest: Effort normal. No respiratory distress. He has no wheezes.  Abdominal: Soft. He exhibits no distension. There is no tenderness.  Neurological: He is alert.  Skin: Skin is warm.    ED Course  Procedures (including critical care time) Labs Review Labs Reviewed - No data to display  Imaging Review Ct Maxillofacial Wo Cm  08/15/2013   CLINICAL DATA:  Left-sided sinus pressure and pain.  EXAM: CT MAXILLOFACIAL WITHOUT CONTRAST  TECHNIQUE: Multidetector CT imaging of the maxillofacial structures was performed. Multiplanar CT image reconstructions were also generated. A small metallic BB was placed on the right temple in order to reliably differentiate right from left.  COMPARISON:  None.  FINDINGS: No fracture or other significant bony abnormality is noted. Globes and orbits appear normal. There is complete opacification of the left maxillary and frontal sinuses, as well as the anterior portion of the left ethmoid sinus. 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 There is expansion of the left frontal sinus with focal erosion of the anterior table. Erosion of the lamina papyracea on the left side is noted, with the inflammatory contents  of the left frontal and ethmoid sinuses bulging into the superior and medial portion of the left orbit. The remaining paranasal sinuses appear normal.  IMPRESSION: Complete opacification of the left frontal and maxillary sinuses is noted, as well as involvement of the anterior cells of the left ethmoid sinus. 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 There is expansion of the left frontal sinus with focal erosion seen involving the anterior table of the frontal skull. There is a large defect involving the left lamina papyracea superiorly and anteriorly secondary to this inflammation, with the inflammatory contents of the left frontal and ethmoid sinuses bulging into the superior and medial portion of the left orbit.   Electronically Signed   By: Sabino Dick M.D.   On:  08/15/2013 10:56     EKG Interpretation None     RA sat is 97% and I interpret to be adequate  3:53 PM Spoke to at length with Dr. Janace Hoard.  He reviewed films.  He feels pt does need surgery, but can be outpt.  He can be seen in the office tomorrow at 3:30.  Recommends IV zosyn, pain control for now.     3:57 PM Discussed tentative plan with pt's family.  They understand that after IV abx and pain medication, if he is feeling somewhat improved, can be discharged to home on Augmentin and pain mediations and plan on following up with Dr. Janace Hoard at 3:30 PM tomorrow.  Will sign out to Dr. Zenia Resides to reassess pt at that time.    MDM   Final diagnoses:  Sinusitis    Pt with severe sinusitis, no fever, intact mental status.  Will discuss with ENT as consultant.  Screening labs, give IV analgesics.      Saddie Benders. Dorna Mai, MD 08/15/13 [place] Lynde Ludwig, MD 08/15/13 1558

## 2013-08-15 NOTE — ED Notes (Signed)
Patient given something to eat and drink. Son at the bedside helping patient.

## 2013-08-15 NOTE — ED Provider Notes (Signed)
Patient signed out to me by Dr. Dorna Mai and seen by Dr. Janace Hoard from ENT. Pain meds given here and patient stable for discharge.  Leota Jacobsen, MD 08/15/13 [phone]

## 2013-08-16 ENCOUNTER — Other Ambulatory Visit: Payer: Self-pay | Admitting: Otolaryngology

## 2013-08-17 ENCOUNTER — Encounter (HOSPITAL_COMMUNITY): Payer: Self-pay | Admitting: *Deleted

## 2013-08-17 NOTE — Progress Notes (Signed)
Pt's PCP is Dr. Hilma Favors in [place]. Pt gave me permission via phone to speak with his wife, [name] for his pre-op call. Stop Bang sent to Dr. Hilma Favors.

## 2013-08-17 NOTE — Progress Notes (Signed)
08/17/13 [place]  Have you ever been diagnosed with sleep apnea through a sleep study? No  Do you snore loudly (loud enough to be heard through closed doors)?  0  Do you often feel tired, fatigued, or sleepy during the daytime? 0  Has anyone observed you stop breathing during your sleep? 0  Do you have, or are you being treated for high blood pressure? 1  BMI more than 35 kg/m2? 0  Age over 67 years old? 1  Neck circumference greater than 40 cm/16 inches? 1 (16 collar)  Gender: 1  Obstructive Sleep Apnea Score 4  Score 4 or greater  Results sent to PCP

## 2013-08-18 ENCOUNTER — Ambulatory Visit (HOSPITAL_COMMUNITY): Payer: Medicare Other | Admitting: Anesthesiology

## 2013-08-18 ENCOUNTER — Encounter (HOSPITAL_COMMUNITY): Payer: Self-pay | Admitting: *Deleted

## 2013-08-18 ENCOUNTER — Encounter (HOSPITAL_COMMUNITY): Payer: Medicare Other | Admitting: Anesthesiology

## 2013-08-18 ENCOUNTER — Encounter (HOSPITAL_COMMUNITY)
Admission: RE | Disposition: A | Discharge status: Home / Self Care | Payer: Self-pay | Source: Ambulatory Visit | Attending: Otolaryngology

## 2013-08-18 ENCOUNTER — Ambulatory Visit (HOSPITAL_COMMUNITY)
Admission: RE | Admit: 2013-08-18 | Discharge: 2013-08-18 | Disposition: A | Discharge status: Home / Self Care | Payer: Medicare Other | Source: Ambulatory Visit | Attending: Otolaryngology | Admitting: Otolaryngology

## 2013-08-18 DIAGNOSIS — I10 Essential (primary) hypertension: Secondary | ICD-10-CM | POA: Insufficient documentation

## 2013-08-18 DIAGNOSIS — K5732 Diverticulitis of large intestine without perforation or abscess without bleeding: Secondary | ICD-10-CM | POA: Insufficient documentation

## 2013-08-18 DIAGNOSIS — J438 Other emphysema: Secondary | ICD-10-CM | POA: Insufficient documentation

## 2013-08-18 DIAGNOSIS — F17200 Nicotine dependence, unspecified, uncomplicated: Secondary | ICD-10-CM | POA: Insufficient documentation

## 2013-08-18 DIAGNOSIS — M129 Arthropathy, unspecified: Secondary | ICD-10-CM | POA: Insufficient documentation

## 2013-08-18 DIAGNOSIS — I739 Peripheral vascular disease, unspecified: Secondary | ICD-10-CM | POA: Insufficient documentation

## 2013-08-18 DIAGNOSIS — Z885 Allergy status to narcotic agent status: Secondary | ICD-10-CM | POA: Insufficient documentation

## 2013-08-18 DIAGNOSIS — N400 Enlarged prostate without lower urinary tract symptoms: Secondary | ICD-10-CM | POA: Insufficient documentation

## 2013-08-18 DIAGNOSIS — Z79899 Other long term (current) drug therapy: Secondary | ICD-10-CM | POA: Insufficient documentation

## 2013-08-18 DIAGNOSIS — J329 Chronic sinusitis, unspecified: Secondary | ICD-10-CM

## 2013-08-18 DIAGNOSIS — G510 Bell's palsy: Secondary | ICD-10-CM | POA: Insufficient documentation

## 2013-08-18 HISTORY — DX: Diverticulitis of intestine, part unspecified, without perforation or abscess without bleeding: K57.92

## 2013-08-18 HISTORY — DX: Pneumonia, unspecified organism: J18.9

## 2013-08-18 HISTORY — PX: SINUS ENDO W/FUSION: SHX777

## 2013-08-18 HISTORY — DX: Chronic obstructive pulmonary disease, unspecified: J44.9

## 2013-08-18 HISTORY — DX: Headache: R51

## 2013-08-18 HISTORY — DX: Benign prostatic hyperplasia without lower urinary tract symptoms: N40.0

## 2013-08-18 HISTORY — DX: Essential (primary) hypertension: I10

## 2013-08-18 HISTORY — DX: Bell's palsy: G51.0

## 2013-08-18 SURGERY — SINUS SURGERY, ENDOSCOPIC, USING COMPUTER-ASSISTED NAVIGATION
Anesthesia: General | Site: Nose | Laterality: Left

## 2013-08-18 MED ORDER — ROCURONIUM BROMIDE 50 MG/5ML IV SOLN
INTRAVENOUS | Status: AC
  Filled 2013-08-18: qty 1

## 2013-08-18 MED ORDER — MUPIROCIN CALCIUM 2 % EX CREA
TOPICAL_CREAM | CUTANEOUS | Status: DC | PRN
  Administered 2013-08-18: 1 via TOPICAL

## 2013-08-18 MED ORDER — ONDANSETRON HCL 4 MG/2ML IJ SOLN
INTRAMUSCULAR | Status: AC
  Filled 2013-08-18: qty 2

## 2013-08-18 MED ORDER — ARTIFICIAL TEARS OP OINT
TOPICAL_OINTMENT | OPHTHALMIC | Status: AC
  Filled 2013-08-18: qty 3.5

## 2013-08-18 MED ORDER — FENTANYL CITRATE 0.05 MG/ML IJ SOLN
INTRAMUSCULAR | Status: AC
  Filled 2013-08-18: qty 5

## 2013-08-18 MED ORDER — NEOSTIGMINE METHYLSULFATE 10 MG/10ML IV SOLN
INTRAVENOUS | Status: DC | PRN
  Administered 2013-08-18: 5 mg via INTRAVENOUS

## 2013-08-18 MED ORDER — OXYMETAZOLINE HCL 0.05 % NA SOLN
NASAL | Status: DC | PRN
  Administered 2013-08-18: 1 via NASAL

## 2013-08-18 MED ORDER — 0.9 % SODIUM CHLORIDE (POUR BTL) OPTIME
TOPICAL | Status: DC | PRN
  Administered 2013-08-18: 1000 mL

## 2013-08-18 MED ORDER — PROPOFOL 10 MG/ML IV BOLUS
INTRAVENOUS | Status: AC
  Filled 2013-08-18: qty 20

## 2013-08-18 MED ORDER — GLYCOPYRROLATE 0.2 MG/ML IJ SOLN
INTRAMUSCULAR | Status: AC
  Filled 2013-08-18: qty 2

## 2013-08-18 MED ORDER — FENTANYL CITRATE 0.05 MG/ML IJ SOLN: 25.0000 ug | INTRAMUSCULAR | Status: DC | PRN

## 2013-08-18 MED ORDER — MUPIROCIN 2 % EX OINT
TOPICAL_OINTMENT | CUTANEOUS | Status: AC
  Filled 2013-08-18: qty 22

## 2013-08-18 MED ORDER — LACTATED RINGERS IV SOLN
INTRAVENOUS | Status: DC | PRN
  Administered 2013-08-18 (×2): via INTRAVENOUS

## 2013-08-18 MED ORDER — NEOSTIGMINE METHYLSULFATE 10 MG/10ML IV SOLN
INTRAVENOUS | Status: AC
  Filled 2013-08-18: qty 1

## 2013-08-18 MED ORDER — MUPIROCIN CALCIUM 2 % EX CREA
TOPICAL_CREAM | CUTANEOUS | Status: AC
  Filled 2013-08-18: qty 15

## 2013-08-18 MED ORDER — LIDOCAINE HCL (CARDIAC) 20 MG/ML IV SOLN
INTRAVENOUS | Status: AC
  Filled 2013-08-18: qty 5

## 2013-08-18 MED ORDER — MIDAZOLAM HCL 5 MG/5ML IJ SOLN
INTRAMUSCULAR | Status: DC | PRN
  Administered 2013-08-18: 2 mg via INTRAVENOUS

## 2013-08-18 MED ORDER — OXYMETAZOLINE HCL 0.05 % NA SOLN
NASAL | Status: AC
  Filled 2013-08-18: qty 15

## 2013-08-18 MED ORDER — LIDOCAINE-EPINEPHRINE 1 %-1:100000 IJ SOLN
INTRAMUSCULAR | Status: DC | PRN
  Administered 2013-08-18: 2 mL

## 2013-08-18 MED ORDER — MIDAZOLAM HCL 2 MG/2ML IJ SOLN
INTRAMUSCULAR | Status: AC
  Filled 2013-08-18: qty 2

## 2013-08-18 MED ORDER — ONDANSETRON HCL 4 MG/2ML IJ SOLN: 4.0000 mg | Freq: Once | INTRAMUSCULAR | Status: DC | PRN

## 2013-08-18 MED ORDER — GLYCOPYRROLATE 0.2 MG/ML IJ SOLN
INTRAMUSCULAR | Status: DC | PRN
  Administered 2013-08-18: .8 mg via INTRAVENOUS

## 2013-08-18 MED ORDER — FENTANYL CITRATE 0.05 MG/ML IJ SOLN
INTRAMUSCULAR | Status: DC | PRN
  Administered 2013-08-18: 150 ug via INTRAVENOUS
  Administered 2013-08-18: 50 ug via INTRAVENOUS

## 2013-08-18 MED ORDER — LACTATED RINGERS IV SOLN
INTRAVENOUS | Status: DC
  Administered 2013-08-18: 10:00:00 via INTRAVENOUS

## 2013-08-18 MED ORDER — SODIUM CHLORIDE 0.9 % IR SOLN
Status: DC | PRN
  Administered 2013-08-18 (×2): 1000 mL

## 2013-08-18 MED ORDER — PROPOFOL 10 MG/ML IV BOLUS
INTRAVENOUS | Status: DC | PRN
  Administered 2013-08-18: 50 mg via INTRAVENOUS
  Administered 2013-08-18: 120 mg via INTRAVENOUS

## 2013-08-18 MED ORDER — ROCURONIUM BROMIDE 100 MG/10ML IV SOLN
INTRAVENOUS | Status: DC | PRN
  Administered 2013-08-18: 40 mg via INTRAVENOUS

## 2013-08-18 MED ORDER — LIDOCAINE HCL (CARDIAC) 20 MG/ML IV SOLN
INTRAVENOUS | Status: DC | PRN
  Administered 2013-08-18: 60 mg via INTRAVENOUS

## 2013-08-18 MED ORDER — SODIUM CHLORIDE 0.9 % IV SOLN
10.0000 mg | INTRAVENOUS | Status: DC | PRN
  Administered 2013-08-18: 25 ug/min via INTRAVENOUS

## 2013-08-18 MED ORDER — ARTIFICIAL TEARS OP OINT
TOPICAL_OINTMENT | OPHTHALMIC | Status: DC | PRN
  Administered 2013-08-18: 1 via OPHTHALMIC

## 2013-08-18 MED ORDER — LIDOCAINE-EPINEPHRINE 1 %-1:100000 IJ SOLN
INTRAMUSCULAR | Status: AC
  Filled 2013-08-18: qty 1

## 2013-08-18 MED ORDER — ONDANSETRON HCL 4 MG/2ML IJ SOLN
INTRAMUSCULAR | Status: DC | PRN
  Administered 2013-08-18: 4 mg via INTRAVENOUS

## 2013-08-18 SURGICAL SUPPLY — 60 items
BLADE 10 SAFETY STRL DISP (BLADE) ×1 IMPLANT
BLADE ROTATE RAD 40 4 M4 (BLADE) ×1 IMPLANT
BLADE ROTATE RAD 40 4MM M4 (BLADE) ×1
BLADE ROTATE TRICUT 4MX13CM M4 (BLADE) ×1
BLADE ROTATE TRICUT 4X13 M4 (BLADE) ×1 IMPLANT
CANISTER SUCTION 2500CC (MISCELLANEOUS) ×5 IMPLANT
CLOSURE WOUND 1/2 X4 (GAUZE/BANDAGES/DRESSINGS)
COAGULATOR SUCT 6 FR SWTCH (ELECTROSURGICAL) ×1
COAGULATOR SUCT SWTCH 10FR 6 (ELECTROSURGICAL) ×2 IMPLANT
CONT SPEC 4OZ CLIKSEAL STRL BL (MISCELLANEOUS) IMPLANT
CORDS BIPOLAR (ELECTRODE) IMPLANT
CRADLE DONUT ADULT HEAD (MISCELLANEOUS) ×2 IMPLANT
DRESSING TELFA 8X3 (GAUZE/BANDAGES/DRESSINGS) IMPLANT
DRSG NASOPORE 8CM (GAUZE/BANDAGES/DRESSINGS) ×2 IMPLANT
DURASEAL SPINE SEALANT 3ML (MISCELLANEOUS) IMPLANT
ELECT REM PT RETURN 9FT ADLT (ELECTROSURGICAL) ×3
ELECTRODE REM PT RTRN 9FT ADLT (ELECTROSURGICAL) IMPLANT
FILTER ARTHROSCOPY CONVERTOR (FILTER) ×3 IMPLANT
FLOSEAL 10ML (HEMOSTASIS) IMPLANT
GLOVE BIO SURGEON STRL SZ 6.5 (GLOVE) ×1 IMPLANT
GLOVE BIO SURGEONS STRL SZ 6.5 (GLOVE) ×1
GLOVE BIOGEL PI IND STRL 6.5 (GLOVE) IMPLANT
GLOVE BIOGEL PI INDICATOR 6.5 (GLOVE) ×6
GLOVE ECLIPSE 7.5 STRL STRAW (GLOVE) ×3 IMPLANT
GOWN STRL REUS W/ TWL LRG LVL3 (GOWN DISPOSABLE) ×1 IMPLANT
GOWN STRL REUS W/ TWL XL LVL3 (GOWN DISPOSABLE) IMPLANT
GOWN STRL REUS W/TWL LRG LVL3 (GOWN DISPOSABLE) ×6
GOWN STRL REUS W/TWL XL LVL3 (GOWN DISPOSABLE) ×3
KIT BASIN OR (CUSTOM PROCEDURE TRAY) ×3 IMPLANT
KIT ROOM TURNOVER OR (KITS) ×3 IMPLANT
NDL SPNL 25GX3.5 QUINCKE BL (NEEDLE) IMPLANT
NEEDLE SPNL 25GX3.5 QUINCKE BL (NEEDLE) IMPLANT
NS IRRIG 1000ML POUR BTL (IV SOLUTION) ×3 IMPLANT
PAD ARMBOARD 7.5X6 YLW CONV (MISCELLANEOUS) ×6 IMPLANT
PAD ENT ADHESIVE 25PK (MISCELLANEOUS) ×3 IMPLANT
PATTIES SURGICAL .5 X3 (DISPOSABLE) ×3 IMPLANT
PENCIL FOOT CONTROL (ELECTRODE) IMPLANT
SHEATH ENDOSCRUB 0 DEG (SHEATH) ×2 IMPLANT
SHEATH ENDOSCRUB 30 DEG (SHEATH) IMPLANT
SHEATH ENDOSCRUB 45 DEG (SHEATH) IMPLANT
SOLUTION ANTI FOG 6CC (MISCELLANEOUS) ×3 IMPLANT
SOLUTION BETADINE 4OZ (MISCELLANEOUS) ×2 IMPLANT
SPECIMEN JAR SMALL (MISCELLANEOUS) ×4 IMPLANT
SPONGE GAUZE 4X4 12PLY (GAUZE/BANDAGES/DRESSINGS) ×2 IMPLANT
STRIP CLOSURE SKIN 1/2X4 (GAUZE/BANDAGES/DRESSINGS) IMPLANT
SUT ETHILON 3 0 PS 1 (SUTURE) IMPLANT
SWAB COLLECTION DEVICE MRSA (MISCELLANEOUS) IMPLANT
SYR 50ML SLIP (SYRINGE) IMPLANT
SYR CONTROL 10ML LL (SYRINGE) ×1 IMPLANT
TAPE SURG TRANSPORE 1 IN (GAUZE/BANDAGES/DRESSINGS) IMPLANT
TAPE SURGICAL TRANSPORE 1 IN (GAUZE/BANDAGES/DRESSINGS) ×2
TOWEL OR 17X26 10 PK STRL BLUE (TOWEL DISPOSABLE) ×3 IMPLANT
TRACKER ENT INSTRUMENT (MISCELLANEOUS) ×3 IMPLANT
TRACKER ENT PATIENT (MISCELLANEOUS) ×3 IMPLANT
TRAY ENT MC OR (CUSTOM PROCEDURE TRAY) ×3 IMPLANT
TUBE CONNECTING 20'X1/4 (TUBING) ×1
TUBE CONNECTING 20X1/4 (TUBING) ×2 IMPLANT
TUBING STRAIGHTSHOT EPS 5PK (TUBING) ×3 IMPLANT
WATER STERILE IRR 1000ML POUR (IV SOLUTION) ×3 IMPLANT
WIPE INSTRUMENT VISIWIPE 73X73 (MISCELLANEOUS) ×3 IMPLANT

## 2013-08-18 NOTE — H&P (Signed)
Derek [name] is an 68 y.o. male.   Chief Complaint: sinusitis HPI: hx of left sinusitis and severe headache now needs surgical drainage  Past Medical History  Diagnosis Date  . Peripheral vascular disease   . Arthritis   . Cancer     skin  . Adenomatous polyps     remote past, on 5 year surveillance  . Hypertension   . COPD (chronic obstructive pulmonary disease)     emphysema  . Pneumonia   . Headache(784.0)     having recently  . Diverticulitis   . Enlarged prostate   . Bell's palsy 40+yrs ago    Past Surgical History  Procedure Laterality Date  . Spine surgery      lumbar & cervical spine surgeries X 8   . Iliac artery stent  2006    Left CIA stenting  . Colonoscopy  07/30/2005    Internal hemorrhoids; otherwise, normal rectum/ Left-sided diverticula  . Colonoscopy  01/11/2012    Procedure: COLONOSCOPY;  Surgeon: Daneil Dolin, MD;  Location: [facility];  Service: Endoscopy;  Laterality: N/A;  1:15  . Rotator cuff repair Right     3 different surgeries  . Vasectomy      Family History  Problem Relation Age of Onset  . Heart disease Mother   . Heart attack Father   . Heart disease Father     Heart Disease before age 34  . Colon cancer Neg Hx    Social History:  reports that he has been smoking Cigarettes.  He has a 78 pack-year smoking history. He has never used smokeless tobacco. He reports that he does not drink alcohol or use illicit drugs.  Allergies:  Allergies  Allergen Reactions  . Oxycodone Nausea Only    High doses    Medications Prior to Admission  Medication Sig Dispense Refill  . amLODipine (NORVASC) 10 MG tablet Take 10 mg by mouth Daily.      Marland Kitchen amoxicillin-clavulanate (AUGMENTIN) 875-125 MG per tablet Take 1 tablet by mouth 2 (two) times daily.  20 tablet  0  . diazepam (VALIUM) 10 MG tablet Take 10 mg by mouth every 6 (six) hours as needed. Anxiety      . furosemide (LASIX) 20 MG tablet Take 20 mg by mouth daily as needed. Fluid  Retention      . gabapentin (NEURONTIN) 600 MG tablet Take 600 mg by mouth 3 (three) times daily.      Marland Kitchen guaiFENesin (MUCINEX) 600 MG 12 hr tablet Take 1,200 mg by mouth 2 (two) times daily as needed. Congestion      . HYDROcodone-acetaminophen (NORCO/VICODIN) 5-325 MG per tablet 1-2 tablets po q 6 hours prn moderate to severe pain  20 tablet  0  . lactulose (CHRONULAC) 10 GM/15ML solution Take 15-30 mLs by mouth daily.      Marland Kitchen lisinopril (PRINIVIL,ZESTRIL) 20 MG tablet Take 20 mg by mouth Daily.      . naproxen sodium (ANAPROX) 220 MG tablet Take 220 mg by mouth 2 (two) times daily with a meal.      . Omega-3 Fatty Acids (FISH OIL) 1200 MG CAPS Take 1,200 mg by mouth 3 (three) times daily.      Marland Kitchen OVER THE COUNTER MEDICATION Take 2 tablets by mouth daily as needed (sinuses).      Marland Kitchen oxyCODONE-acetaminophen (PERCOCET) 10-325 MG per tablet Take 1 tablet by mouth every 4 (four) hours as needed for pain.       Marland Kitchen  cyclobenzaprine (FLEXERIL) 10 MG tablet Take 1 tablet by mouth daily as needed.        No results found for this or any previous visit (from the past 48 hour(s)). No results found.  Review of Systems  Constitutional: Negative.   Eyes: Negative.   Respiratory: Negative.   Cardiovascular: Negative.   Skin: Negative.   Neurological: Positive for headaches.    Blood pressure 121/53, pulse 76, temperature 97.6 F (36.4 C), temperature source Oral, resp. rate 18, weight 92.987 kg (205 lb), SpO2 97.00%. Physical Exam  Constitutional: He appears well-developed and well-nourished.  HENT:  Head: Normocephalic and atraumatic.  Mouth/Throat: Oropharynx is clear and moist.  Eyes: Pupils are equal, round, and reactive to light.  Neck: Normal range of motion. Neck supple.  Cardiovascular: Normal rate.   Respiratory: Effort normal.  GI: Soft.     Assessment/Plan Sinusitis- he is ready to proceed with ESS and procedure discussed  Melissa Montane 08/18/2013, 11:40 AM

## 2013-08-18 NOTE — Op Note (Signed)
Preop/postop diagnoses: Left chronic sinusitis with mucocele Procedure: Left endoscopic sinus surgery with maxillary antrostomy stripping, total ethmoidectomy, frontal sinusotomy, and Fusion computer guidance Anesthesia: Gen. Estimated blood loss: 25 cc Indications: 68 year old with a severe headache on the left side that started last week. The patient had CT scan findings of a complete opacification of the left sinus cavities with a mucocele in the left frontal region with erosion and extension into his orbit. The patient was informed a risk and benefits of the procedure and options were discussed all questions are answered and consent was obtained. Procedure: Patient was taken to the operating room placed in the supine position after general endotracheal tube anesthesia was prepped and draped in the usual sterile manner. The fusion system was positioned calibrated with excellent accuracy. The left inferior and middle turbinate were injected with 1% lidocaine with 1 100,000 epinephrine. There was purulence coming from the infundibulum and polypoid appearing mucosa filling the infundibulum. The middle turbinate could still be identified. Using the microdebrider and Fusion guidance the polyps were debrided to the uncinate process which was removed  up to the attachment of middle turbinate. The antrostomy was opened widely and there was significant amount of purulent material within the maxillary sinus filling it and the curved 40 microdebrider was used to remove the polypoid material and purulence. Sinus was irrigated. The ethmoid was then opened and dissection was carried from posterior to anterior with thick mucosa throughout and some purulence. There was no purulence in the posterior aspect. The nasofrontal duct was then opened with the curved suction and Fusion guidance and it opened easily. There was some purulence material that came from the area but no evidence of CSF. The anterior aspect was debrided  with a curved microdebrider. None of the polypoid material had the appearance of a papilloma tissue. There was reasonable hemostasis and Nasopor soaked in Bactroban was placed in the ethmoid cavity. The nasopharynx suctioned out all blood and debris. The patient was awake and brought to recovery in stable condition counts correct

## 2013-08-18 NOTE — Anesthesia Procedure Notes (Signed)
Procedure Name: Intubation Date/Time: 08/18/2013 12:21 PM Performed by: Wanita Chamberlain Pre-anesthesia Checklist: Patient identified, Emergency Drugs available, Timeout performed, Patient being monitored and Suction available Patient Re-evaluated:Patient Re-evaluated prior to inductionOxygen Delivery Method: Circle system utilized Preoxygenation: Pre-oxygenation with 100% oxygen Intubation Type: IV induction Ventilation: Mask ventilation without difficulty and Oral airway inserted - appropriate to patient size Laryngoscope Size: Mac and 4 Grade View: Grade I Tube type: Oral Tube size: 7.5 mm Number of attempts: 1 Airway Equipment and Method: Stylet Placement Confirmation: ETT inserted through vocal cords under direct vision,  breath sounds checked- equal and bilateral and positive ETCO2 Secured at: 22 cm Tube secured with: Tape Dental Injury: Teeth and Oropharynx as per pre-operative assessment

## 2013-08-18 NOTE — Transfer of Care (Signed)
Immediate Anesthesia Transfer of Care Note  Patient: Derek [name]  Procedure(s) Performed: Procedure(s): ENDOSCOPIC SINUS SURGERY WITH FUSION NAVIGATION (Left)  Patient Location: PACU  Anesthesia Type:General  Level of Consciousness: sedated and patient cooperative  Airway & Oxygen Therapy: Patient Spontanous Breathing and Patient connected to face mask oxygen  Post-op Assessment: Report given to PACU RN and Post -op Vital signs reviewed and stable  Post vital signs: Reviewed and stable  Complications: No apparent anesthesia complications

## 2013-08-18 NOTE — Anesthesia Preprocedure Evaluation (Addendum)
Anesthesia Evaluation  Patient identified by MRN, date of birth, ID band Patient awake    Reviewed: Allergy & Precautions, H&P , NPO status , Patient's Chart, lab work & pertinent test results  History of Anesthesia Complications Negative for: history of anesthetic complications  Airway Mallampati: III TM Distance: >3 FB Neck ROM: Limited    Dental  (+) Dental Advisory Given, Implants, Edentulous Lower, Edentulous Upper   Pulmonary neg sleep apnea, neg COPDCurrent Smoker,  breath sounds clear to auscultation        Cardiovascular hypertension, Pt. on medications - angina+ Peripheral Vascular Disease - Past MI - dysrhythmias - Valvular Problems/MurmursRhythm:Regular Rate:Normal  18-Aug-2013 09:57:15 [facility] ROUTINE RECORD Normal sinus rhythm with sinus arrhythmia Normal ECG   Neuro/Psych  Headaches, negative psych ROS   GI/Hepatic negative GI ROS, Neg liver ROS, GERD-  Medicated,  Endo/Other  negative endocrine ROS  Renal/GU negative Renal ROS     Musculoskeletal negative musculoskeletal ROS (+)   Abdominal   Peds  Hematology negative hematology ROS (+)   Anesthesia Other Findings   Reproductive/Obstetrics                       Anesthesia Physical Anesthesia Plan  ASA: II  Anesthesia Plan: General   Post-op Pain Management:    Induction: Intravenous  Airway Management Planned: Oral ETT  Additional Equipment: None  Intra-op Plan:   Post-operative Plan: Extubation in OR  Informed Consent: I have reviewed the patients History and Physical, chart, labs and discussed the procedure including the risks, benefits and alternatives for the proposed anesthesia with the patient or authorized representative who has indicated his/her understanding and acceptance.   Dental advisory given  Plan Discussed with: CRNA and Surgeon  Anesthesia Plan Comments:          Anesthesia Quick Evaluation

## 2013-08-18 NOTE — Anesthesia Postprocedure Evaluation (Signed)
  Anesthesia Post-op Note  Patient: Derek [name]  Procedure(s) Performed: Procedure(s): ENDOSCOPIC SINUS SURGERY WITH FUSION NAVIGATION (Left)  Patient Location: PACU  Anesthesia Type:General  Level of Consciousness: awake and alert   Airway and Oxygen Therapy: Patient Spontanous Breathing  Post-op Pain: mild  Post-op Assessment: Post-op Vital signs reviewed, Patient's Cardiovascular Status Stable, Respiratory Function Stable, Patent Airway, No signs of Nausea or vomiting and Pain level controlled  Post-op Vital Signs: Reviewed and stable  Last Vitals:  Filed Vitals:   08/18/13 1428  BP: 125/60  Pulse: 91  Temp: 36.5 C  Resp: 14    Complications: No apparent anesthesia complications

## 2013-08-22 ENCOUNTER — Encounter (HOSPITAL_COMMUNITY): Payer: Self-pay | Admitting: Otolaryngology

## 2013-12-14 ENCOUNTER — Other Ambulatory Visit: Payer: Self-pay | Admitting: Otolaryngology

## 2013-12-15 ENCOUNTER — Encounter (HOSPITAL_COMMUNITY): Payer: Self-pay | Admitting: Pharmacy Technician

## 2013-12-19 ENCOUNTER — Encounter (HOSPITAL_COMMUNITY)
Admission: RE | Admit: 2013-12-19 | Discharge: 2013-12-19 | Disposition: A | Discharge status: Home / Self Care | Payer: Medicare Other | Source: Ambulatory Visit | Attending: Otolaryngology | Admitting: Otolaryngology

## 2013-12-19 ENCOUNTER — Encounter (HOSPITAL_COMMUNITY): Payer: Self-pay

## 2013-12-19 DIAGNOSIS — R51 Headache: Secondary | ICD-10-CM | POA: Diagnosis not present

## 2013-12-19 DIAGNOSIS — I739 Peripheral vascular disease, unspecified: Secondary | ICD-10-CM | POA: Diagnosis not present

## 2013-12-19 DIAGNOSIS — J449 Chronic obstructive pulmonary disease, unspecified: Secondary | ICD-10-CM | POA: Diagnosis not present

## 2013-12-19 DIAGNOSIS — J329 Chronic sinusitis, unspecified: Secondary | ICD-10-CM | POA: Diagnosis not present

## 2013-12-19 DIAGNOSIS — F17200 Nicotine dependence, unspecified, uncomplicated: Secondary | ICD-10-CM | POA: Diagnosis not present

## 2013-12-19 DIAGNOSIS — I10 Essential (primary) hypertension: Secondary | ICD-10-CM | POA: Diagnosis not present

## 2013-12-19 DIAGNOSIS — M129 Arthropathy, unspecified: Secondary | ICD-10-CM | POA: Diagnosis not present

## 2013-12-19 DIAGNOSIS — Z86010 Personal history of colonic polyps: Secondary | ICD-10-CM | POA: Diagnosis not present

## 2013-12-19 HISTORY — DX: Other symptoms and signs involving emotional state: R45.89

## 2013-12-19 LAB — BASIC METABOLIC PANEL
ANION GAP: 14 (ref 5–15)
BUN: 8 mg/dL (ref 6–23)
CALCIUM: 9.2 mg/dL (ref 8.4–10.5)
CHLORIDE: 96 meq/L (ref 96–112)
CO2: 25 mEq/L (ref 19–32)
CREATININE: 0.88 mg/dL (ref 0.50–1.35)
GFR, EST NON AFRICAN AMERICAN: 87 mL/min — AB (ref >90)
Glucose, Bld: 85 mg/dL (ref 70–99)
Potassium: 4.3 mEq/L (ref 3.7–5.3)
Sodium: 135 mEq/L — ABNORMAL LOW (ref 137–147)

## 2013-12-19 LAB — CBC
HCT: 41.4 % (ref 39.0–52.0)
HEMOGLOBIN: 13.6 g/dL (ref 13.0–17.0)
MCH: 31.1 pg (ref 26.0–34.0)
MCHC: 32.9 g/dL (ref 30.0–36.0)
MCV: 94.7 fL (ref 78.0–100.0)
Platelets: 267 10*3/uL (ref 150–400)
RBC: 4.37 MIL/uL (ref 4.22–5.81)
RDW: 13.4 % (ref 11.5–15.5)
WBC: 7.3 10*3/uL (ref 4.0–10.5)

## 2013-12-19 NOTE — Pre-Procedure Instructions (Signed)
[facility]  12/19/2013   Your procedure is scheduled on:  12/21/13  Report to [facility] Admitting at 530 AM.  Call this number if you have problems the morning of surgery: [phone]   Remember:   Do not eat food or drink liquids after midnight.   Take these medicines the morning of surgery with A SIP OF WATER: qmlodipine,valium,neurontin,oxycodone   Do not wear jewelry, make-up or nail polish.  Do not wear lotions, powders, or perfumes. You may wear deodorant.  Do not shave 48 hours prior to surgery. Men may shave face and neck.  Do not bring valuables to the hospital.  [facility] is not responsible                  for any belongings or valuables.               Contacts, dentures or bridgework may not be worn into surgery.  Leave suitcase in the car. After surgery it may be brought to your room.  For patients admitted to the hospital, discharge time is determined by your                treatment team.               Patients discharged the day of surgery will not be allowed to drive  home.  Name and phone number of your driver:  Special Instructions: Shower using CHG 2 nights before surgery and the night before surgery.  If you shower the day of surgery use CHG.  Use special wash - you have one bottle of CHG for all showers.  You should use approximately 1/3 of the bottle for each shower.   Please read over the following fact sheets that you were given: Pain Booklet, Coughing and Deep Breathing and Surgical Site Infection Prevention

## 2013-12-21 ENCOUNTER — Ambulatory Visit (HOSPITAL_COMMUNITY): Payer: Medicare Other | Admitting: Anesthesiology

## 2013-12-21 ENCOUNTER — Encounter (HOSPITAL_COMMUNITY): Payer: Medicare Other | Admitting: Anesthesiology

## 2013-12-21 ENCOUNTER — Encounter (HOSPITAL_COMMUNITY): Payer: Self-pay | Admitting: *Deleted

## 2013-12-21 ENCOUNTER — Encounter (HOSPITAL_COMMUNITY)
Admission: RE | Disposition: A | Discharge status: Home / Self Care | Payer: Self-pay | Source: Ambulatory Visit | Attending: Otolaryngology

## 2013-12-21 ENCOUNTER — Ambulatory Visit (HOSPITAL_COMMUNITY)
Admission: RE | Admit: 2013-12-21 | Discharge: 2013-12-21 | Disposition: A | Discharge status: Home / Self Care | Payer: Medicare Other | Source: Ambulatory Visit | Attending: Otolaryngology | Admitting: Otolaryngology

## 2013-12-21 DIAGNOSIS — J449 Chronic obstructive pulmonary disease, unspecified: Secondary | ICD-10-CM | POA: Insufficient documentation

## 2013-12-21 DIAGNOSIS — Z860100 Personal history of colon polyps, unspecified: Secondary | ICD-10-CM | POA: Insufficient documentation

## 2013-12-21 DIAGNOSIS — J329 Chronic sinusitis, unspecified: Secondary | ICD-10-CM | POA: Insufficient documentation

## 2013-12-21 DIAGNOSIS — I739 Peripheral vascular disease, unspecified: Secondary | ICD-10-CM | POA: Insufficient documentation

## 2013-12-21 DIAGNOSIS — J4489 Other specified chronic obstructive pulmonary disease: Secondary | ICD-10-CM | POA: Insufficient documentation

## 2013-12-21 DIAGNOSIS — F17200 Nicotine dependence, unspecified, uncomplicated: Secondary | ICD-10-CM | POA: Insufficient documentation

## 2013-12-21 DIAGNOSIS — R51 Headache: Secondary | ICD-10-CM | POA: Insufficient documentation

## 2013-12-21 DIAGNOSIS — I10 Essential (primary) hypertension: Secondary | ICD-10-CM | POA: Insufficient documentation

## 2013-12-21 DIAGNOSIS — J321 Chronic frontal sinusitis: Secondary | ICD-10-CM

## 2013-12-21 DIAGNOSIS — M129 Arthropathy, unspecified: Secondary | ICD-10-CM | POA: Insufficient documentation

## 2013-12-21 HISTORY — PX: SINUS ENDO W/FUSION: SHX777

## 2013-12-21 SURGERY — SINUS SURGERY, ENDOSCOPIC, USING COMPUTER-ASSISTED NAVIGATION
Anesthesia: General | Site: Nose | Laterality: Left

## 2013-12-21 MED ORDER — ALBUTEROL SULFATE (2.5 MG/3ML) 0.083% IN NEBU
INHALATION_SOLUTION | RESPIRATORY_TRACT | Status: AC
  Administered 2013-12-21: 2.5 mg
  Filled 2013-12-21: qty 3

## 2013-12-21 MED ORDER — MIDAZOLAM HCL 2 MG/2ML IJ SOLN
INTRAMUSCULAR | Status: AC
  Filled 2013-12-21: qty 2

## 2013-12-21 MED ORDER — CEFAZOLIN SODIUM-DEXTROSE 2-3 GM-% IV SOLR
2.0000 g | Freq: Once | INTRAVENOUS | Status: AC
  Administered 2013-12-21: 2 g via INTRAVENOUS

## 2013-12-21 MED ORDER — PHENYLEPHRINE HCL 10 MG/ML IJ SOLN
INTRAMUSCULAR | Status: DC | PRN
  Administered 2013-12-21: 200 ug via INTRAVENOUS
  Administered 2013-12-21: 120 ug via INTRAVENOUS
  Administered 2013-12-21: 80 ug via INTRAVENOUS

## 2013-12-21 MED ORDER — PROPOFOL 10 MG/ML IV BOLUS
INTRAVENOUS | Status: AC
  Filled 2013-12-21: qty 20

## 2013-12-21 MED ORDER — LIDOCAINE-EPINEPHRINE 1 %-1:100000 IJ SOLN
INTRAMUSCULAR | Status: DC | PRN
  Administered 2013-12-21: 30 mL

## 2013-12-21 MED ORDER — PHENYLEPHRINE 40 MCG/ML (10ML) SYRINGE FOR IV PUSH (FOR BLOOD PRESSURE SUPPORT)
PREFILLED_SYRINGE | INTRAVENOUS | Status: AC
  Filled 2013-12-21: qty 10

## 2013-12-21 MED ORDER — GLYCOPYRROLATE 0.2 MG/ML IJ SOLN
INTRAMUSCULAR | Status: AC
  Filled 2013-12-21: qty 4

## 2013-12-21 MED ORDER — SODIUM CHLORIDE 0.9 % IR SOLN
Status: DC | PRN
  Administered 2013-12-21: 1000 mL

## 2013-12-21 MED ORDER — SODIUM CHLORIDE 0.9 % IJ SOLN
INTRAMUSCULAR | Status: AC
  Filled 2013-12-21: qty 10

## 2013-12-21 MED ORDER — LIDOCAINE-EPINEPHRINE 1 %-1:100000 IJ SOLN
INTRAMUSCULAR | Status: AC
  Filled 2013-12-21: qty 1

## 2013-12-21 MED ORDER — LIDOCAINE HCL (CARDIAC) 20 MG/ML IV SOLN
INTRAVENOUS | Status: DC | PRN
  Administered 2013-12-21: 80 mg via INTRAVENOUS

## 2013-12-21 MED ORDER — PHENYLEPHRINE HCL 10 MG/ML IJ SOLN
INTRAMUSCULAR | Status: AC
  Filled 2013-12-21: qty 1

## 2013-12-21 MED ORDER — OXYCODONE HCL 5 MG/5ML PO SOLN: 5.0000 mg | Freq: Once | ORAL | Status: AC | PRN

## 2013-12-21 MED ORDER — FENTANYL CITRATE 0.05 MG/ML IJ SOLN
INTRAMUSCULAR | Status: DC | PRN
  Administered 2013-12-21: 50 ug via INTRAVENOUS

## 2013-12-21 MED ORDER — EPHEDRINE SULFATE 50 MG/ML IJ SOLN
INTRAMUSCULAR | Status: AC
  Filled 2013-12-21: qty 1

## 2013-12-21 MED ORDER — ROCURONIUM BROMIDE 100 MG/10ML IV SOLN
INTRAVENOUS | Status: DC | PRN
  Administered 2013-12-21: 50 mg via INTRAVENOUS

## 2013-12-21 MED ORDER — OXYCODONE HCL 5 MG PO TABS
5.0000 mg | ORAL_TABLET | Freq: Once | ORAL | Status: AC | PRN
  Administered 2013-12-21: 5 mg via ORAL

## 2013-12-21 MED ORDER — SODIUM CHLORIDE 0.9 % IV SOLN
10.0000 mg | INTRAVENOUS | Status: DC | PRN
  Administered 2013-12-21: 20 ug/min via INTRAVENOUS

## 2013-12-21 MED ORDER — SUCCINYLCHOLINE CHLORIDE 20 MG/ML IJ SOLN
INTRAMUSCULAR | Status: AC
  Filled 2013-12-21: qty 1

## 2013-12-21 MED ORDER — OXYMETAZOLINE HCL 0.05 % NA SOLN
NASAL | Status: AC
  Filled 2013-12-21: qty 15

## 2013-12-21 MED ORDER — OXYMETAZOLINE HCL 0.05 % NA SOLN
NASAL | Status: DC | PRN
  Administered 2013-12-21: 1 via NASAL

## 2013-12-21 MED ORDER — MUPIROCIN CALCIUM 2 % EX CREA
TOPICAL_CREAM | CUTANEOUS | Status: AC
  Filled 2013-12-21: qty 15

## 2013-12-21 MED ORDER — GLYCOPYRROLATE 0.2 MG/ML IJ SOLN
INTRAMUSCULAR | Status: DC | PRN
  Administered 2013-12-21: .8 mg via INTRAVENOUS

## 2013-12-21 MED ORDER — CEFAZOLIN SODIUM-DEXTROSE 2-3 GM-% IV SOLR
INTRAVENOUS | Status: AC
  Filled 2013-12-21: qty 50

## 2013-12-21 MED ORDER — LACTATED RINGERS IV SOLN
INTRAVENOUS | Status: DC | PRN
  Administered 2013-12-21: 07:00:00 via INTRAVENOUS

## 2013-12-21 MED ORDER — ONDANSETRON HCL 4 MG/2ML IJ SOLN
INTRAMUSCULAR | Status: DC | PRN
  Administered 2013-12-21: 4 mg via INTRAVENOUS

## 2013-12-21 MED ORDER — ARTIFICIAL TEARS OP OINT
TOPICAL_OINTMENT | OPHTHALMIC | Status: AC
  Filled 2013-12-21: qty 3.5

## 2013-12-21 MED ORDER — PROMETHAZINE HCL 25 MG/ML IJ SOLN: 6.2500 mg | INTRAMUSCULAR | Status: DC | PRN

## 2013-12-21 MED ORDER — FENTANYL CITRATE 0.05 MG/ML IJ SOLN: 25.0000 ug | INTRAMUSCULAR | Status: DC | PRN

## 2013-12-21 MED ORDER — FENTANYL CITRATE 0.05 MG/ML IJ SOLN
INTRAMUSCULAR | Status: AC
  Filled 2013-12-21: qty 5

## 2013-12-21 MED ORDER — NEOSTIGMINE METHYLSULFATE 10 MG/10ML IV SOLN
INTRAVENOUS | Status: DC | PRN
  Administered 2013-12-21: 4 mg via INTRAVENOUS

## 2013-12-21 MED ORDER — NEOSTIGMINE METHYLSULFATE 10 MG/10ML IV SOLN
INTRAVENOUS | Status: AC
  Filled 2013-12-21: qty 1

## 2013-12-21 MED ORDER — ROCURONIUM BROMIDE 50 MG/5ML IV SOLN
INTRAVENOUS | Status: AC
  Filled 2013-12-21: qty 1

## 2013-12-21 MED ORDER — ARTIFICIAL TEARS OP OINT
TOPICAL_OINTMENT | OPHTHALMIC | Status: DC | PRN
  Administered 2013-12-21: 1 via OPHTHALMIC

## 2013-12-21 MED ORDER — PROPOFOL 10 MG/ML IV BOLUS
INTRAVENOUS | Status: DC | PRN
  Administered 2013-12-21: 180 mg via INTRAVENOUS

## 2013-12-21 MED ORDER — ONDANSETRON HCL 4 MG/2ML IJ SOLN
INTRAMUSCULAR | Status: AC
  Filled 2013-12-21: qty 2

## 2013-12-21 MED ORDER — OXYCODONE HCL 5 MG PO TABS
ORAL_TABLET | ORAL | Status: AC
  Filled 2013-12-21: qty 1

## 2013-12-21 MED ORDER — ALBUTEROL SULFATE HFA 108 (90 BASE) MCG/ACT IN AERS
INHALATION_SPRAY | RESPIRATORY_TRACT | Status: AC
  Filled 2013-12-21: qty 6.7

## 2013-12-21 MED ORDER — ALBUTEROL SULFATE HFA 108 (90 BASE) MCG/ACT IN AERS
INHALATION_SPRAY | RESPIRATORY_TRACT | Status: DC | PRN
  Administered 2013-12-21: 2 via RESPIRATORY_TRACT

## 2013-12-21 MED ORDER — LIDOCAINE HCL (CARDIAC) 20 MG/ML IV SOLN
INTRAVENOUS | Status: AC
  Filled 2013-12-21: qty 5

## 2013-12-21 SURGICAL SUPPLY — 52 items
BLADE 10 SAFETY STRL DISP (BLADE) ×3 IMPLANT
BLADE ROTATE RAD 40 4 M4 (BLADE) ×1 IMPLANT
BLADE ROTATE RAD 40 4MM M4 (BLADE) ×1
BLADE ROTATE TRICUT 4MX13CM M4 (BLADE) ×1
BLADE ROTATE TRICUT 4X13 M4 (BLADE) ×1 IMPLANT
CANISTER SUCTION 2500CC (MISCELLANEOUS) ×3 IMPLANT
CLOSURE WOUND 1/2 X4 (GAUZE/BANDAGES/DRESSINGS)
COAGULATOR SUCT 6 FR SWTCH (ELECTROSURGICAL) ×1
COAGULATOR SUCT SWTCH 10FR 6 (ELECTROSURGICAL) ×2 IMPLANT
CONT SPEC 4OZ CLIKSEAL STRL BL (MISCELLANEOUS) ×2 IMPLANT
CORDS BIPOLAR (ELECTRODE) IMPLANT
CRADLE DONUT ADULT HEAD (MISCELLANEOUS) IMPLANT
DRSG NASOPORE 8CM (GAUZE/BANDAGES/DRESSINGS) ×2 IMPLANT
ELECT REM PT RETURN 9FT ADLT (ELECTROSURGICAL)
ELECTRODE REM PT RTRN 9FT ADLT (ELECTROSURGICAL) IMPLANT
FILTER ARTHROSCOPY CONVERTOR (FILTER) ×3 IMPLANT
GLOVE BIOGEL PI IND STRL 7.0 (GLOVE) IMPLANT
GLOVE BIOGEL PI INDICATOR 7.0 (GLOVE) ×2
GLOVE ECLIPSE 7.5 STRL STRAW (GLOVE) ×3 IMPLANT
GLOVE SURG SS PI 7.0 STRL IVOR (GLOVE) ×2 IMPLANT
GOWN BRE IMP SLV SIRUS LXLNG (GOWN DISPOSABLE) ×3 IMPLANT
GOWN STRL REUS W/ TWL LRG LVL3 (GOWN DISPOSABLE) ×1 IMPLANT
GOWN STRL REUS W/TWL LRG LVL3 (GOWN DISPOSABLE) ×3
KIT BASIN OR (CUSTOM PROCEDURE TRAY) ×3 IMPLANT
KIT ROOM TURNOVER OR (KITS) ×3 IMPLANT
NDL SPNL 25GX3.5 QUINCKE BL (NEEDLE) IMPLANT
NEEDLE SPNL 25GX3.5 QUINCKE BL (NEEDLE) IMPLANT
NS IRRIG 1000ML POUR BTL (IV SOLUTION) ×3 IMPLANT
PAD ARMBOARD 7.5X6 YLW CONV (MISCELLANEOUS) ×6 IMPLANT
PAD ENT ADHESIVE 25PK (MISCELLANEOUS) ×3 IMPLANT
PATTIES SURGICAL .5 X3 (DISPOSABLE) ×3 IMPLANT
PENCIL FOOT CONTROL (ELECTRODE) IMPLANT
RAINS FRONTAL SINUS STENT ×2 IMPLANT
SHEATH ENDOSCRUB 0 DEG (SHEATH) IMPLANT
SHEATH ENDOSCRUB 30 DEG (SHEATH) IMPLANT
SHEATH ENDOSCRUB 45 DEG (SHEATH) IMPLANT
SOLUTION ANTI FOG 6CC (MISCELLANEOUS) ×3 IMPLANT
SPECIMEN JAR SMALL (MISCELLANEOUS) ×4 IMPLANT
STRIP CLOSURE SKIN 1/2X4 (GAUZE/BANDAGES/DRESSINGS) IMPLANT
SUT ETHILON 3 0 PS 1 (SUTURE) IMPLANT
SWAB COLLECTION DEVICE MRSA (MISCELLANEOUS) IMPLANT
SYR 50ML SLIP (SYRINGE) IMPLANT
SYR CONTROL 10ML LL (SYRINGE) ×3 IMPLANT
TOWEL OR 17X26 10 PK STRL BLUE (TOWEL DISPOSABLE) ×3 IMPLANT
TRACKER ENT INSTRUMENT (MISCELLANEOUS) ×3 IMPLANT
TRACKER ENT PATIENT (MISCELLANEOUS) ×3 IMPLANT
TRAY ENT MC OR (CUSTOM PROCEDURE TRAY) ×3 IMPLANT
TUBE CONNECTING 20'X1/4 (TUBING) ×1
TUBE CONNECTING 20X1/4 (TUBING) ×2 IMPLANT
TUBING STRAIGHTSHOT EPS 5PK (TUBING) ×3 IMPLANT
WATER STERILE IRR 1000ML POUR (IV SOLUTION) ×3 IMPLANT
WIPE INSTRUMENT VISIWIPE 73X73 (MISCELLANEOUS) ×3 IMPLANT

## 2013-12-21 NOTE — Discharge Instructions (Signed)

## 2013-12-21 NOTE — Progress Notes (Signed)
Dr Ola Spurr called re o2 sats.  Will give albuterol neb, cont to monitor.

## 2013-12-21 NOTE — Transfer of Care (Signed)
Immediate Anesthesia Transfer of Care Note  Patient: Derek [name]  Procedure(s) Performed: Procedure(s) with comments: ENDOSCOPIC SINUS SURGERY WITH FUSION NAVIGATION (Left) - ESS with fusion left frontal sinus with Rain Stent  Patient Location: PACU  Anesthesia Type:General  Level of Consciousness: patient cooperative, lethargic and responds to stimulation  Airway & Oxygen Therapy: Patient Spontanous Breathing and Patient connected to nasal cannula oxygen  Post-op Assessment: Report given to PACU RN  Post vital signs: Reviewed and stable  Complications: No apparent anesthesia complications

## 2013-12-21 NOTE — Anesthesia Procedure Notes (Signed)
Procedure Name: Intubation Date/Time: 12/21/2013 7:39 AM Performed by: Barrington Ellison Pre-anesthesia Checklist: Patient identified, Emergency Drugs available, Suction available, Patient being monitored and Timeout performed Patient Re-evaluated:Patient Re-evaluated prior to inductionOxygen Delivery Method: Circle system utilized Preoxygenation: Pre-oxygenation with 100% oxygen Intubation Type: IV induction Ventilation: Mask ventilation without difficulty Laryngoscope Size: Mac and 3 Grade View: Grade I Tube type: Oral Tube size: 7.5 mm Number of attempts: 1 Airway Equipment and Method: Stylet Placement Confirmation: ETT inserted through vocal cords under direct vision,  positive ETCO2 and breath sounds checked- equal and bilateral Secured at: 23 cm Tube secured with: Tape Dental Injury: Teeth and Oropharynx as per pre-operative assessment

## 2013-12-21 NOTE — Op Note (Signed)
Preop/postop diagnoses: Left chronic sinusitis Procedure: Endoscopic sinus surgery with fusion guidance and placement of Rain stent Anesthesia: Gen. 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 He previously had endoscopic sinus surgery clearing the acute infection and opening up this area. He's now many months out from the surgery and it has clearly scarred back over and has no drainage from the frontal sinus. He now needs re\re opening of the sinus with a placement of a stent. The patient was informed risks and benefits of the procedure and options were discussed all questions were answered and consent was obtained. Procedure: Patient was taken to the operating room placed in the supine position after general endotracheal tube anesthesia was prepped and draped in usual sterile manner. The Fusion guidance system was positioned calibrated with excellent accuracy. The oxymetazoline pledgets were placed into the left nasal frontal duct region in the middle turbinate injected with 1% lidocaine with 1 100,000 epinephrine. The nasal frontal duct was completely closed. Using the Guidant system and the probe initially the probe was inserted up through the nasal frontal duct into the frontal sinus which clearly looked opacified on the CT scan. The curved suction was then used using Fusion guidance and again placed up through the opening that dilated this. The 40 microdebrider was then placed able to pass through the ostia and opened anteriorly with the microdebrider. Using the 45 scope the sinus was evaluated and look like there was no obvious mass or tumor and the sinus looked open. Piece of the mucosa that was at the nasal frontal duct was taken for specimen and sent. The Rain  stent was then placed after applying some Bactroban. The guided probe was used to place it up into the frontal sinus positioned and then a piece of nasal poor was placed  posterior to the stent. The patient is then awakened brought to recovery room in stable condition counts correct

## 2013-12-21 NOTE — Anesthesia Postprocedure Evaluation (Signed)
  Anesthesia Post-op Note  Patient: Derek [name]  Procedure(s) Performed: Procedure(s) with comments: ENDOSCOPIC SINUS SURGERY WITH FUSION NAVIGATION (Left) - ESS with fusion left frontal sinus with Rain Stent  Patient Location: PACU  Anesthesia Type:General  Level of Consciousness: awake, alert  and oriented  Airway and Oxygen Therapy: Patient Spontanous Breathing  Post-op Pain: none  Post-op Assessment: Post-op Vital signs reviewed  Post-op Vital Signs: Reviewed  Last Vitals:  Filed Vitals:   12/21/13 1115  BP: 129/52  Pulse: 91  Temp:   Resp: 15    Complications: No apparent anesthesia complications

## 2013-12-21 NOTE — Progress Notes (Signed)
Noted inhaler at bedside from OR.  Patient was not on this preop, and this was discussed with wife.  Wife took the Medication "because they gave it to [place] before."

## 2013-12-21 NOTE — Progress Notes (Signed)
Albuterol neb

## 2013-12-21 NOTE — H&P (Signed)
The templates of H$P do not work and hospital cannot provide anyone who can help with fixing this system. I called the help line with no response from the provider Hx of sinusitis and now recurrence.   Exam awake and alert no swelling of the eye.  cv- rrrr l-clear Ext no tenderness  Chronic sinusitis- ready for stent placement  And ESS

## 2013-12-21 NOTE — Anesthesia Preprocedure Evaluation (Addendum)
Anesthesia Evaluation  Patient identified by MRN, date of birth, ID band Patient awake    Reviewed: Allergy & Precautions, H&P , NPO status , Patient's Chart, lab work & pertinent test results  Airway Mallampati: IV TM Distance: <3 FB Neck ROM: Full    Dental  (+) Edentulous Upper, Edentulous Lower   Pulmonary COPDCurrent Smoker,  breath sounds clear to auscultation        Cardiovascular hypertension, + Peripheral Vascular Disease Rhythm:Regular Rate:Normal     Neuro/Psych  Headaches, negative psych ROS   GI/Hepatic negative GI ROS, Neg liver ROS,   Endo/Other  negative endocrine ROS  Renal/GU negative Renal ROS  negative genitourinary   Musculoskeletal  (+) Arthritis -,   Abdominal   Peds  Hematology negative hematology ROS (+)   Anesthesia Other Findings   Reproductive/Obstetrics negative OB ROS                          Anesthesia Physical Anesthesia Plan  ASA: III  Anesthesia Plan: General   Post-op Pain Management:    Induction: Intravenous  Airway Management Planned: Oral ETT  Additional Equipment: None  Intra-op Plan:   Post-operative Plan: Extubation in OR  Informed Consent: I have reviewed the patients History and Physical, chart, labs and discussed the procedure including the risks, benefits and alternatives for the proposed anesthesia with the patient or authorized representative who has indicated his/her understanding and acceptance.   Dental advisory given  Plan Discussed with: CRNA and Anesthesiologist  Anesthesia Plan Comments:         Anesthesia Quick Evaluation

## 2013-12-26 ENCOUNTER — Encounter (HOSPITAL_COMMUNITY): Payer: Self-pay | Admitting: Otolaryngology

## 2014-06-20 ENCOUNTER — Ambulatory Visit (HOSPITAL_COMMUNITY)
Admission: RE | Admit: 2014-06-20 | Discharge: 2014-06-20 | Disposition: A | Discharge status: Home / Self Care | Payer: PPO | Source: Ambulatory Visit | Attending: Internal Medicine | Admitting: Internal Medicine

## 2014-06-20 ENCOUNTER — Other Ambulatory Visit (HOSPITAL_COMMUNITY): Payer: Self-pay | Admitting: Internal Medicine

## 2014-06-20 DIAGNOSIS — R1030 Lower abdominal pain, unspecified: Secondary | ICD-10-CM

## 2014-06-20 DIAGNOSIS — R1031 Right lower quadrant pain: Secondary | ICD-10-CM | POA: Diagnosis present

## 2014-06-20 LAB — POCT I-STAT CREATININE: CREATININE: 1.1 mg/dL (ref 0.50–1.35)

## 2014-06-20 MED ORDER — IOHEXOL 300 MG/ML  SOLN
100.0000 mL | Freq: Once | INTRAMUSCULAR | Status: AC | PRN
  Administered 2014-06-20: 100 mL via INTRAVENOUS

## 2014-07-09 ENCOUNTER — Encounter: Payer: Self-pay | Admitting: Family

## 2014-07-10 ENCOUNTER — Ambulatory Visit (HOSPITAL_COMMUNITY)
Admission: RE | Admit: 2014-07-10 | Discharge: 2014-07-10 | Disposition: A | Discharge status: Home / Self Care | Payer: PPO | Source: Ambulatory Visit | Attending: Family | Admitting: Family

## 2014-07-10 ENCOUNTER — Ambulatory Visit (INDEPENDENT_AMBULATORY_CARE_PROVIDER_SITE_OTHER): Payer: PPO | Admitting: Family

## 2014-07-10 ENCOUNTER — Ambulatory Visit (INDEPENDENT_AMBULATORY_CARE_PROVIDER_SITE_OTHER)
Admission: RE | Admit: 2014-07-10 | Discharge: 2014-07-10 | Disposition: A | Discharge status: Home / Self Care | Payer: PPO | Source: Ambulatory Visit | Attending: Family | Admitting: Family

## 2014-07-10 ENCOUNTER — Encounter: Payer: Self-pay | Admitting: Family

## 2014-07-10 VITALS — BP 119/69 | HR 92 | Temp 98.0°F | Resp 14 | Ht 69.0 in | Wt 218.0 lb

## 2014-07-10 DIAGNOSIS — I739 Peripheral vascular disease, unspecified: Secondary | ICD-10-CM | POA: Diagnosis not present

## 2014-07-10 DIAGNOSIS — F17200 Nicotine dependence, unspecified, uncomplicated: Secondary | ICD-10-CM | POA: Insufficient documentation

## 2014-07-10 DIAGNOSIS — Z95828 Presence of other vascular implants and grafts: Secondary | ICD-10-CM

## 2014-07-10 DIAGNOSIS — I771 Stricture of artery: Secondary | ICD-10-CM

## 2014-07-10 DIAGNOSIS — R0989 Other specified symptoms and signs involving the circulatory and respiratory systems: Secondary | ICD-10-CM

## 2014-07-10 DIAGNOSIS — Z9889 Other specified postprocedural states: Secondary | ICD-10-CM

## 2014-07-10 DIAGNOSIS — Z720 Tobacco use: Secondary | ICD-10-CM

## 2014-07-10 NOTE — Patient Instructions (Signed)
Peripheral Vascular Disease Peripheral Vascular Disease (PVD), also called Peripheral Arterial Disease (PAD), is a circulation problem caused by cholesterol (atherosclerotic plaque) deposits in the arteries. PVD commonly occurs in the lower extremities (legs) but it can occur in other areas of the body, such as your arms. The cholesterol buildup in the arteries reduces blood flow which can cause pain and other serious problems. The presence of PVD can place a person at risk for Coronary Artery Disease (CAD).  CAUSES  Causes of PVD can be many. It is usually associated with more than one risk factor such as:   High Cholesterol.  Smoking.  Diabetes.  Lack of exercise or inactivity.  High blood pressure (hypertension).  Obesity.  Family history. SYMPTOMS   When the lower extremities are affected, patients with PVD may experience:  Leg pain with exertion or physical activity. This is called INTERMITTENT CLAUDICATION. This may present as cramping or numbness with physical activity. The location of the pain is associated with the level of blockage. For example, blockage at the abdominal level (distal abdominal aorta) may result in buttock or hip pain. Lower leg arterial blockage may result in calf pain.  As PVD becomes more severe, pain can develop with less physical activity.  In people with severe PVD, leg pain may occur at rest.  Other PVD signs and symptoms:  Leg numbness or weakness.  Coldness in the affected leg or foot, especially when compared to the other leg.  A change in leg color.  Patients with significant PVD are more prone to ulcers or sores on toes, feet or legs. These may take longer to heal or may reoccur. The ulcers or sores can become infected.  If signs and symptoms of PVD are ignored, gangrene may occur. This can result in the loss of toes or loss of an entire limb.  Not all leg pain is related to PVD. Other medical conditions can cause leg pain such  as:  Blood clots (embolism) or Deep Vein Thrombosis.  Inflammation of the blood vessels (vasculitis).  Spinal stenosis. DIAGNOSIS  Diagnosis of PVD can involve several different types of tests. These can include:  Pulse Volume Recording Method (PVR). This test is simple, painless and does not involve the use of X-rays. PVR involves measuring and comparing the blood pressure in the arms and legs. An ABI (Ankle-Brachial Index) is calculated. The normal ratio of blood pressures is 1. As this number becomes smaller, it indicates more severe disease.  < 0.95 - indicates significant narrowing in one or more leg vessels.  <0.8 - there will usually be pain in the foot, leg or buttock with exercise.  <0.4 - will usually have pain in the legs at rest.  <0.25 - usually indicates limb threatening PVD.  Doppler detection of pulses in the legs. This test is painless and checks to see if you have a pulses in your legs/feet.  A dye or contrast material (a substance that highlights the blood vessels so they show up on x-ray) may be given to help your caregiver better see the arteries for the following tests. The dye is eliminated from your body by the kidney's. Your caregiver may order blood work to check your kidney function and other laboratory values before the following tests are performed:  Magnetic Resonance Angiography (MRA). An MRA is a picture study of the blood vessels and arteries. The MRA machine uses a large magnet to produce images of the blood vessels.  Computed Tomography Angiography (CTA). A CTA   is a specialized x-ray that looks at how the blood flows in your blood vessels. An IV may be inserted into your arm so contrast dye can be injected.  Angiogram. Is a procedure that uses x-rays to look at your blood vessels. This procedure is minimally invasive, meaning a small incision (cut) is made in your groin. A small tube (catheter) is then inserted into the artery of your groin. The catheter  is guided to the blood vessel or artery your caregiver wants to examine. Contrast dye is injected into the catheter. X-rays are then taken of the blood vessel or artery. After the images are obtained, the catheter is taken out. TREATMENT  Treatment of PVD involves many interventions which may include:  Lifestyle changes:  Quitting smoking.  Exercise.  Following a low fat, low cholesterol diet.  Control of diabetes.  Foot care is very important to the PVD patient. Good foot care can help prevent infection.  Medication:  Cholesterol-lowering medicine.  Blood pressure medicine.  Anti-platelet drugs.  Certain medicines may reduce symptoms of Intermittent Claudication.  Interventional/Surgical options:  Angioplasty. An Angioplasty is a procedure that inflates a balloon in the blocked artery. This opens the blocked artery to improve blood flow.  Stent Implant. A wire mesh tube (stent) is placed in the artery. The stent expands and stays in place, allowing the artery to remain open.  Peripheral Bypass Surgery. This is a surgical procedure that reroutes the blood around a blocked artery to help improve blood flow. This type of procedure may be performed if Angioplasty or stent implants are not an option. SEEK IMMEDIATE MEDICAL CARE IF:   You develop pain or numbness in your arms or legs.  Your arm or leg turns cold, becomes blue in color.  You develop redness, warmth, swelling and pain in your arms or legs. MAKE SURE YOU:   Understand these instructions.  Will watch your condition.  Will get help right away if you are not doing well or get worse. Document Released: 05/14/2004 Document Revised: 06/29/2011 Document Reviewed: 04/10/2008 [employer] Patient Information 2015 [employer]. This information is not intended to replace advice given to you by your health care provider. Make sure you discuss any questions you have with your health care provider.    Smoking  Cessation Quitting smoking is important to your health and has many advantages. However, it is not always easy to quit since nicotine is a very addictive drug. Oftentimes, people try 3 times or more before being able to quit. This document explains the best ways for you to prepare to quit smoking. Quitting takes hard work and a lot of effort, but you can do it. ADVANTAGES OF QUITTING SMOKING  You will live longer, feel better, and live better.  Your body will feel the impact of quitting smoking almost immediately.  Within 20 minutes, blood pressure decreases. Your pulse returns to its normal level.  After 8 hours, carbon monoxide levels in the blood return to normal. Your oxygen level increases.  After 24 hours, the chance of having a heart attack starts to decrease. Your breath, hair, and body stop smelling like smoke.  After 48 hours, damaged nerve endings begin to recover. Your sense of taste and smell improve.  After 72 hours, the body is virtually free of nicotine. Your bronchial tubes relax and breathing becomes easier.  After 2 to 12 weeks, lungs can hold more air. Exercise becomes easier and circulation improves.  The risk of having a heart attack, stroke,   cancer, or lung disease is greatly reduced.  After 1 year, the risk of coronary heart disease is cut in half.  After 5 years, the risk of stroke falls to the same as a nonsmoker.  After 10 years, the risk of lung cancer is cut in half and the risk of other cancers decreases significantly.  After 15 years, the risk of coronary heart disease drops, usually to the level of a nonsmoker.  If you are pregnant, quitting smoking will improve your chances of having a healthy baby.  The people you live with, especially any children, will be healthier.  You will have extra money to spend on things other than cigarettes. QUESTIONS TO THINK ABOUT BEFORE ATTEMPTING TO QUIT You may want to talk about your answers with your health care  provider.  Why do you want to quit?  If you tried to quit in the past, what helped and what did not?  What will be the most difficult situations for you after you quit? How will you plan to handle them?  Who can help you through the tough times? Your family? Friends? A health care provider?  What pleasures do you get from smoking? What ways can you still get pleasure if you quit? Here are some questions to ask your health care provider:  How can you help me to be successful at quitting?  What medicine do you think would be best for me and how should I take it?  What should I do if I need more help?  What is smoking withdrawal like? How can I get information on withdrawal? GET READY  Set a quit date.  Change your environment by getting rid of all cigarettes, ashtrays, matches, and lighters in your home, car, or work. Do not let people smoke in your home.  Review your past attempts to quit. Think about what worked and what did not. GET SUPPORT AND ENCOURAGEMENT You have a better chance of being successful if you have help. You can get support in many ways.  Tell your family, friends, and coworkers that you are going to quit and need their support. Ask them not to smoke around you.  Get individual, group, or telephone counseling and support. Programs are available at local hospitals and health centers. Call your local health department for information about programs in your area.  Spiritual beliefs and practices may help some smokers quit.  Download a "quit meter" on your computer to keep track of quit statistics, such as how long you have gone without smoking, cigarettes not smoked, and money saved.  Get a self-help book about quitting smoking and staying off tobacco. LEARN NEW SKILLS AND BEHAVIORS  Distract yourself from urges to smoke. Talk to someone, go for a walk, or occupy your time with a task.  Change your normal routine. Take a different route to work. Drink tea  instead of coffee. Eat breakfast in a different place.  Reduce your stress. Take a hot bath, exercise, or read a book.  Plan something enjoyable to do every day. Reward yourself for not smoking.  Explore interactive web-based programs that specialize in helping you quit. GET MEDICINE AND USE IT CORRECTLY Medicines can help you stop smoking and decrease the urge to smoke. Combining medicine with the above behavioral methods and support can greatly increase your chances of successfully quitting smoking.  Nicotine replacement therapy helps deliver nicotine to your body without the negative effects and risks of smoking. Nicotine replacement therapy includes nicotine gum, lozenges,   inhalers, nasal sprays, and skin patches. Some may be available over-the-counter and others require a prescription.  Antidepressant medicine helps people abstain from smoking, but how this works is unknown. This medicine is available by prescription.  Nicotinic receptor partial agonist medicine simulates the effect of nicotine in your brain. This medicine is available by prescription. Ask your health care provider for advice about which medicines to use and how to use them based on your health history. Your health care provider will tell you what side effects to look out for if you choose to be on a medicine or therapy. Carefully read the information on the package. Do not use any other product containing nicotine while using a nicotine replacement product.  RELAPSE OR DIFFICULT SITUATIONS Most relapses occur within the first 3 months after quitting. Do not be discouraged if you start smoking again. Remember, most people try several times before finally quitting. You may have symptoms of withdrawal because your body is used to nicotine. You may crave cigarettes, be irritable, feel very hungry, cough often, get headaches, or have difficulty concentrating. The withdrawal symptoms are only temporary. They are strongest when you  first quit, but they will go away within 10-14 days. To reduce the chances of relapse, try to:  Avoid drinking alcohol. Drinking lowers your chances of successfully quitting.  Reduce the amount of caffeine you consume. Once you quit smoking, the amount of caffeine in your body increases and can give you symptoms, such as a rapid heartbeat, sweating, and anxiety.  Avoid smokers because they can make you want to smoke.  Do not let weight gain distract you. Many smokers will gain weight when they quit, usually less than 10 pounds. Eat a healthy diet and stay active. You can always lose the weight gained after you quit.  Find ways to improve your mood other than smoking. FOR MORE INFORMATION  www.smokefree.gov  Document Released: 03/31/2001 Document Revised: 08/21/2013 Document Reviewed: 07/16/2011 [employer] Patient Information 2015 [employer]. This information is not intended to replace advice given to you by your health care provider. Make sure you discuss any questions you have with your health care provider.    Smoking Cessation, Tips for Success If you are ready to quit smoking, congratulations! You have chosen to help yourself be healthier. Cigarettes bring nicotine, tar, carbon monoxide, and other irritants into your body. Your lungs, heart, and blood vessels will be able to work better without these poisons. There are many different ways to quit smoking. Nicotine gum, nicotine patches, a nicotine inhaler, or nicotine nasal spray can help with physical craving. Hypnosis, support groups, and medicines help break the habit of smoking. WHAT THINGS CAN I DO TO MAKE QUITTING EASIER?  Here are some tips to help you quit for good:  Pick a date when you will quit smoking completely. Tell all of your friends and family about your plan to quit on that date.  Do not try to slowly cut down on the number of cigarettes you are smoking. Pick a quit date and quit smoking completely starting on that  day.  Throw away all cigarettes.   Clean and remove all ashtrays from your home, work, and car.  On a card, write down your reasons for quitting. Carry the card with you and read it when you get the urge to smoke.  Cleanse your body of nicotine. Drink enough water and fluids to keep your urine clear or pale yellow. Do this after quitting to flush the nicotine from   your body.  Learn to predict your moods. Do not let a bad situation be your excuse to have a cigarette. Some situations in your life might tempt you into wanting a cigarette.  Never have "just one" cigarette. It leads to wanting another and another. Remind yourself of your decision to quit.  Change habits associated with smoking. If you smoked while driving or when feeling stressed, try other activities to replace smoking. Stand up when drinking your coffee. Brush your teeth after eating. Sit in a different chair when you read the paper. Avoid alcohol while trying to quit, and try to drink fewer caffeinated beverages. Alcohol and caffeine may urge you to smoke.  Avoid foods and drinks that can trigger a desire to smoke, such as sugary or spicy foods and alcohol.  Ask people who smoke not to smoke around you.  Have something planned to do right after eating or having a cup of coffee. For example, plan to take a walk or exercise.  Try a relaxation exercise to calm you down and decrease your stress. Remember, you may be tense and nervous for the first 2 weeks after you quit, but this will pass.  Find new activities to keep your hands busy. Play with a pen, coin, or rubber band. Doodle or draw things on paper.  Brush your teeth right after eating. This will help cut down on the craving for the taste of tobacco after meals. You can also try mouthwash.   Use oral substitutes in place of cigarettes. Try using lemon drops, carrots, cinnamon sticks, or chewing gum. Keep them handy so they are available when you have the urge to  smoke.  When you have the urge to smoke, try deep breathing.  Designate your home as a nonsmoking area.  If you are a heavy smoker, ask your health care provider about a prescription for nicotine chewing gum. It can ease your withdrawal from nicotine.  Reward yourself. Set aside the cigarette money you save and buy yourself something nice.  Look for support from others. Join a support group or smoking cessation program. Ask someone at home or at work to help you with your plan to quit smoking.  Always ask yourself, "Do I need this cigarette or is this just a reflex?" Tell yourself, "Today, I choose not to smoke," or "I do not want to smoke." You are reminding yourself of your decision to quit.  Do not replace cigarette smoking with electronic cigarettes (commonly called e-cigarettes). The safety of e-cigarettes is unknown, and some may contain harmful chemicals.  If you relapse, do not give up! Plan ahead and think about what you will do the next time you get the urge to smoke. HOW WILL I FEEL WHEN I QUIT SMOKING? You may have symptoms of withdrawal because your body is used to nicotine (the addictive substance in cigarettes). You may crave cigarettes, be irritable, feel very hungry, cough often, get headaches, or have difficulty concentrating. The withdrawal symptoms are only temporary. They are strongest when you first quit but will go away within 10-14 days. When withdrawal symptoms occur, stay in control. Think about your reasons for quitting. Remind yourself that these are signs that your body is healing and getting used to being without cigarettes. Remember that withdrawal symptoms are easier to treat than the major diseases that smoking can cause.  Even after the withdrawal is over, expect periodic urges to smoke. However, these cravings are generally short lived and will go away whether you   smoke or not. Do not smoke! WHAT RESOURCES ARE AVAILABLE TO HELP ME QUIT SMOKING? Your health care  provider can direct you to community resources or hospitals for support, which may include:  Group support.  Education.  Hypnosis.  Therapy. Document Released: 01/03/2004 Document Revised: 08/21/2013 Document Reviewed: 09/22/2012 [employer] Patient Information 2015 [employer]. This information is not intended to replace advice given to you by your health care provider. Make sure you discuss any questions you have with your health care provider.  

## 2014-07-10 NOTE — Progress Notes (Signed)
[facility] [place] HISTORY AND PHYSICAL -PAD  History of Present Illness Derek [name] is a 69 y.o. male patient of Dr. Donnetta Hutching with a history of left CIA stent x2 in June 2006. Patient's back today for ABIs and evaluation of his stent graft which appear to be patent bilaterally. Patient has complaints of lateral pelvis/hip pain that radiates into his groin. Patient states he seen several orthopedists and gotten mixed diagnoses, he's been told he needs hip surgery of one type or other versus another physician saying he does not need a hip replacement. The patient also has a history with Dr. Arnoldo Morale of having lumbar fusion. Patient reports no signs or symptoms of claudication with ambulation just hip pain. He was advised that he needs right hip surgery to address the bilateral radiculopathy and low back pain with walking, and right groin pain when sitting. He is hesitating to have the surgery done as his wife told him he could get blood clots. Explained to pt that anticoagulation is used to help prevent this and this will be better explained to him by his orthopedic surgeon.  He denies non-healing wounds. He denies history of MI or stroke or TIA.  The patient reports New Medical or Surgical History: sinus surgery.  Pt Diabetic: No Pt smoker: smoker (decreased to 1 ppd, started at age 36 yrs)  Pt meds include: Statin :No, states his cholesterol is good but his triglycerides are high ASA: No, sates it makes his stomach bleed Other anticoagulants/antiplatelets: no    Past Medical History  Diagnosis Date  . Peripheral vascular disease   . Arthritis   . Cancer     skin  . Adenomatous polyps     remote past, on 5 year surveillance  . Hypertension   . COPD (chronic obstructive pulmonary disease)     emphysema  . Pneumonia   . Headache(784.0)     having recently  . Diverticulitis   . Enlarged prostate   . Bell's palsy 40+yrs ago  . Flat affect     Social  History History  Substance Use Topics  . Smoking status: Current Every Day Smoker -- 1.00 packs/day for 52 years    Types: Cigarettes  . Smokeless tobacco: Never Used  . Alcohol Use: No    Family History Family History  Problem Relation Age of Onset  . Heart disease Mother   . Heart attack Father   . Heart disease Father     Heart Disease before age 5  . Colon cancer Neg Hx     Past Surgical History  Procedure Laterality Date  . Spine surgery      lumbar & cervical spine surgeries X 8   . Iliac artery stent  2006    Left CIA stenting  . Colonoscopy  07/30/2005    Internal hemorrhoids; otherwise, normal rectum/ Left-sided diverticula  . Colonoscopy  01/11/2012    Procedure: COLONOSCOPY;  Surgeon: Daneil Dolin, MD;  [facility];  Service: Endoscopy;  Laterality: N/A;  1:15  . Rotator cuff repair Right     3 different surgeries  . Vasectomy    . Sinus endo w/fusion Left 08/18/2013    Procedure: ENDOSCOPIC SINUS SURGERY WITH FUSION NAVIGATION;  Surgeon: Melissa Montane, MD;  Location: [place];  Service: ENT;  Laterality: Left;  . Sinus endo w/fusion Left 12/21/2013    Procedure: ENDOSCOPIC SINUS SURGERY WITH FUSION NAVIGATION;  Surgeon: Melissa Montane, MD;  Location: [place];  Service:  ENT;  Laterality: Left;  ESS with fusion left frontal sinus with Rain Stent    No Known Allergies  Current Outpatient Prescriptions  Medication Sig Dispense Refill  . amLODipine (NORVASC) 10 MG tablet Take 10 mg by mouth Daily.    . diazepam (VALIUM) 10 MG tablet Take 10 mg by mouth every 6 (six) hours as needed. Anxiety    . furosemide (LASIX) 20 MG tablet Take 20 mg by mouth daily as needed for fluid.     Marland Kitchen gabapentin (NEURONTIN) 600 MG tablet Take 600 mg by mouth 3 (three) times daily.    Marland Kitchen lactulose (CHRONULAC) 10 GM/15ML solution Take 15-30 mLs by mouth at bedtime.     Marland Kitchen lisinopril (PRINIVIL,ZESTRIL) 20 MG tablet Take 20 mg by mouth Daily.    . naproxen sodium (ALEVE) 220 MG tablet Take  220 mg by mouth 2 (two) times daily with a meal.    . Omega-3 Fatty Acids (FISH OIL) 1200 MG CAPS Take 1,200 mg by mouth 3 (three) times daily.    Marland Kitchen oxyCODONE-acetaminophen (PERCOCET) 10-325 MG per tablet Take 1 tablet by mouth every 4 (four) hours as needed for pain.      No current facility-administered medications for this visit.    ROS: See HPI for pertinent positives and negatives.   Physical Examination  Filed Vitals:   07/10/14 1002  BP: 119/69  Pulse: 92  Temp: 98 F (36.7 C)  TempSrc: Oral  Resp: 14  Height: 5\' 9"  (1.753 m)  Weight: 218 lb (98.884 kg)  SpO2: 97%   Body mass index is 32.18 kg/(m^2).  General: A&O x 3, WDWN. Gait: normal Eyes: PERRLA. Pulmonary: CTAB, without wheezes , rales or rhonchi. Cardiac: regular Rythm , without detected murmur.     Carotid Bruits Left Right   positive Negative  Aorta is not palpable. Radial pulses: 2+ palpable and =   VASCULAR EXAM: Extremities without ischemic changes  without Gangrene; without open wounds.     LE Pulses LEFT RIGHT   FEMORAL  palpable  palpable    POPLITEAL not palpable  not palpable   POSTERIOR TIBIAL not palpable  not palpable    DORSALIS PEDIS  ANTERIOR TIBIAL palpable  palpable    Abdomen: soft, NT, no palpable masses. Skin: no rashes, no ulcers. Musculoskeletal: no muscle wasting or atrophy. Neurologic: A&O X 3; Appropriate Affect ; SENSATION: normal; MOTOR FUNCTION: moving all extremities equally, motor strength 5/5 in arms, 4/5 in legs. Speech is fluent/normal. CN 2-12 intact.           Non-Invasive Vascular Imaging: DATE: 07/10/2014 [facility] - ILIAC DUPLEX EVALUATION    INDICATION: Iliac stent    PREVIOUS INTERVENTION(S): Left common iliac artery  stent 2006    DUPLEX EXAM:      Peak Systolic Velocity (cm/s)  AORTA - Proximal 76  AORTA - Mid 87  AORTA - Distal 147    RIGHT  LEFT  Peak Systolic Velocity (cm/s) Ratio (if abnormal) Waveform  Peak Systolic Velocity (cm/s) Ratio (if abnormal) Waveform     Common Iliac Artery - Proximal 212  M     Common Iliac Artery - Mid 264  M     Common Iliac Artery - Distal Not Visualized       External Iliac Artery - Proximal 340  M     External Iliac Artery - Mid 97  M     External Iliac Artery - Distal 70  B     Internal Iliac  Artery Not Visualized     0.92 Today's ABI / TBI 0.82  1.02 Previous ABI / TBI (07/04/13 ) 0.98    Waveform:    M - Monophasic       B - Biphasic       T - Triphasic  If Ankle Brachial Index (ABI) or Toe Brachial Index (TBI) performed, please see complete report     ADDITIONAL FINDINGS: Decreased visualization of the abdominal vasculature and left common iliac artery stent due to overlying bowel gas and patient body habitus.     IMPRESSION: 1. Patent left common iliac artery stent with Doppler velocities suggestive of possible greater than 50% stenoses in the distal abdominal aorta, left proximal/mid common and proximal external iliac arteries, based on limited visualization, as described above. 2. Comparison to the previous exam is noted on the second page of this report.     Compared to the previous exam:  No significant change in the distal abdominal aorta and left common iliac artery velocities when compared to the previous exam on 07/04/13.      ASSESSMENT: AIDYNN KRENN is a 69 y.o. male who is s/p left common iliac artery stent placed in 2006. Today's aorto-iliac Duplex reveals decreased visualization of the abdominal vasculature and left common iliac artery stent due to overlying bowel gas and patient body habitus. Patent left common iliac artery stent with Doppler velocities suggestive of possible greater than 50% stenoses in the distal abdominal  aorta, left proximal/mid common and proximal external iliac arteries, based on limited visualization, as described above. No significant change in the distal abdominal aorta and left common iliac artery velocities when compared to the previous exam on 07/04/13. ABI's have decreased in both legs, right leg remains in the lower end of normal range, left leg decreased from normal to evidence of mild arterial occlusive disease. However, all waveforms in the legs are triphasic. Right TBI is normal, left TBI is below normal.  Discussed with Dr. Donnetta Hutching, pt has all triphasic waveforms on ABI's. It is extremely unlikely that the degree of his weakness and pain from his hips is due to arterial insufficiency.  Pt advised to follow up with his PCP re complaints of lateral pelvis/hip pain that radiates into his groin. Patient states he seen several orthopedists and gotten mixed diagnoses, he's been told he needs hip surgery of one type or other versus another physician saying he does not need a hip replacement. The patient also has a history with Dr. Arnoldo Morale of having lumbar fusion.  New left carotid bruit not noted at last exam.  Unfortunately he continues to smoke but has decreased use.  Face to face time with patient was 25 minutes. Over 50% of this time was spent on counseling and coordination of care.   PLAN:  The patient was counseled re smoking cessation and given several free resources re smoking cessation.  I discussed in depth with the patient the nature of atherosclerosis, and emphasized the importance of maximal medical management including strict control of blood pressure, blood glucose, and lipid levels, obtaining regular exercise, and cessation of smoking.  The patient is aware that without maximal medical management the underlying atherosclerotic disease process will progress, limiting the benefit of any interventions.  Based on the patient's vascular studies and examination, pt will return to  clinic in 1 month for carotid Duplex and 1 year for ABI's and left iliac artery stent Duplex.  The patient was given information about PAD including signs, symptoms,  treatment, what symptoms should prompt the patient to seek immediate medical care, and risk reduction measures to take.  Clemon Chambers, RN, MSN, FNP-C [facility] Phone: [phone]  Clinic MD: Kellie Simmering  07/10/2014 10:21 AM

## 2014-08-06 ENCOUNTER — Ambulatory Visit (HOSPITAL_COMMUNITY)
Admission: RE | Admit: 2014-08-06 | Discharge: 2014-08-06 | Disposition: A | Discharge status: Home / Self Care | Payer: PPO | Source: Ambulatory Visit | Attending: Surgery | Admitting: Surgery

## 2014-08-06 DIAGNOSIS — Z720 Tobacco use: Secondary | ICD-10-CM

## 2014-08-06 DIAGNOSIS — R0989 Other specified symptoms and signs involving the circulatory and respiratory systems: Secondary | ICD-10-CM | POA: Diagnosis not present

## 2014-08-06 DIAGNOSIS — F17200 Nicotine dependence, unspecified, uncomplicated: Secondary | ICD-10-CM

## 2014-08-13 ENCOUNTER — Encounter: Payer: Self-pay | Admitting: Family

## 2014-08-14 ENCOUNTER — Ambulatory Visit (INDEPENDENT_AMBULATORY_CARE_PROVIDER_SITE_OTHER): Payer: PPO | Admitting: Family

## 2014-08-14 ENCOUNTER — Encounter: Payer: Self-pay | Admitting: Family

## 2014-08-14 VITALS — BP 102/59 | HR 89 | Temp 97.8°F | Resp 14 | Ht 69.0 in | Wt 212.0 lb

## 2014-08-14 DIAGNOSIS — Z9889 Other specified postprocedural states: Secondary | ICD-10-CM | POA: Diagnosis not present

## 2014-08-14 DIAGNOSIS — R0989 Other specified symptoms and signs involving the circulatory and respiratory systems: Secondary | ICD-10-CM | POA: Diagnosis not present

## 2014-08-14 DIAGNOSIS — I739 Peripheral vascular disease, unspecified: Secondary | ICD-10-CM

## 2014-08-14 DIAGNOSIS — Z95828 Presence of other vascular implants and grafts: Secondary | ICD-10-CM

## 2014-08-14 DIAGNOSIS — F17200 Nicotine dependence, unspecified, uncomplicated: Secondary | ICD-10-CM

## 2014-08-14 DIAGNOSIS — Z720 Tobacco use: Secondary | ICD-10-CM

## 2014-08-14 DIAGNOSIS — I6523 Occlusion and stenosis of bilateral carotid arteries: Secondary | ICD-10-CM

## 2014-08-14 NOTE — Patient Instructions (Signed)
Stroke Prevention Some medical conditions and behaviors are associated with an increased chance of having a stroke. You may prevent a stroke by making healthy choices and managing medical conditions. HOW CAN I REDUCE MY RISK OF HAVING A STROKE?   Stay physically active. Get at least 30 minutes of activity on most or all days.  Do not smoke. It may also be helpful to avoid exposure to secondhand smoke.  Limit alcohol use. Moderate alcohol use is considered to be:  No more than 2 drinks per day for men.  No more than 1 drink per day for nonpregnant women.  Eat healthy foods. This involves:  Eating 5 or more servings of fruits and vegetables a day.  Making dietary changes that address high blood pressure (hypertension), high cholesterol, diabetes, or obesity.  Manage your cholesterol levels.  Making food choices that are high in fiber and low in saturated fat, trans fat, and cholesterol may control cholesterol levels.  Take any prescribed medicines to control cholesterol as directed by your health care provider.  Manage your diabetes.  Controlling your carbohydrate and sugar intake is recommended to manage diabetes.  Take any prescribed medicines to control diabetes as directed by your health care provider.  Control your hypertension.  Making food choices that are low in salt (sodium), saturated fat, trans fat, and cholesterol is recommended to manage hypertension.  Take any prescribed medicines to control hypertension as directed by your health care provider.  Maintain a healthy weight.  Reducing calorie intake and making food choices that are low in sodium, saturated fat, trans fat, and cholesterol are recommended to manage weight.  Stop drug abuse.  Avoid taking birth control pills.  Talk to your health care provider about the risks of taking birth control pills if you are over 35 years old, smoke, get migraines, or have ever had a blood clot.  Get evaluated for sleep  disorders (sleep apnea).  Talk to your health care provider about getting a sleep evaluation if you snore a lot or have excessive sleepiness.  Take medicines only as directed by your health care provider.  For some people, aspirin or blood thinners (anticoagulants) are helpful in reducing the risk of forming abnormal blood clots that can lead to stroke. If you have the irregular heart rhythm of atrial fibrillation, you should be on a blood thinner unless there is a good reason you cannot take them.  Understand all your medicine instructions.  Make sure that other conditions (such as anemia or atherosclerosis) are addressed. SEEK IMMEDIATE MEDICAL CARE IF:   You have sudden weakness or numbness of the face, arm, or leg, especially on one side of the body.  Your face or eyelid droops to one side.  You have sudden confusion.  You have trouble speaking (aphasia) or understanding.  You have sudden trouble seeing in one or both eyes.  You have sudden trouble walking.  You have dizziness.  You have a loss of balance or coordination.  You have a sudden, severe headache with no known cause.  You have new chest pain or an irregular heartbeat. Any of these symptoms may represent a serious problem that is an emergency. Do not wait to see if the symptoms will go away. Get medical help at once. Call your local emergency services (911 in [place] Do not drive yourself to the hospital. Document Released: 05/14/2004 Document Revised: 08/21/2013 Document Reviewed: 10/07/2012 Exit[employer] Patient Information 2015 [employer]. This information is not intended to replace advice given   to you by your health care provider. Make sure you discuss any questions you have with your health care provider.  

## 2014-08-14 NOTE — Progress Notes (Signed)
[facility] [place] HISTORY AND PHYSICAL   MRN : [id number]  History of Present Illness:   Derek [name] is a 69 y.o. male patient of Dr. Donnetta Hutching with a history of left CIA stent x2 in June 2006.  The patient is back today for carotid Duplex a month after his last visit at which I detected a new carotid bruit. He denies history of MI or stroke or TIA.  Patient has complaints of lateral pelvis/hip pain that radiates into his groin. Patient states he seen several orthopedists and gotten mixed diagnoses, he's been told he needs hip surgery of one type or other versus another physician saying he does not need a hip replacement. The patient also has a history with Dr. Arnoldo Morale of having lumbar fusion. Patient reports no signs or symptoms of claudication with ambulation just hip pain. He was advised that he needs right hip surgery to address the bilateral radiculopathy and low back pain with walking, and right groin pain when sitting. He is hesitating to have the surgery done as his wife told him he could get blood clots. Explained to pt that anticoagulation is used to help prevent this and this will be better explained to him by his orthopedic surgeon.  He denies non-healing wounds.   The patient reports New Medical or Surgical History: sinus surgery.  Pt Diabetic: No Pt smoker: smoker (decreased to 1 ppd, started at age 36 yrs)   Pt meds include: Statin :No, states his cholesterol is good but his triglycerides are high ASA: No, sates it makes his stomach bleed Other anticoagulants/antiplatelets: no    Current Outpatient Prescriptions  Medication Sig Dispense Refill  . amLODipine (NORVASC) 10 MG tablet Take 10 mg by mouth Daily.    . diazepam (VALIUM) 10 MG tablet Take 10 mg by mouth every 6 (six) hours as needed. Anxiety    . furosemide (LASIX) 20 MG tablet Take 20 mg by mouth daily as needed for fluid.     Marland Kitchen gabapentin (NEURONTIN) 600 MG tablet Take 600 mg by  mouth 3 (three) times daily.    Marland Kitchen lactulose (CHRONULAC) 10 GM/15ML solution Take 15-30 mLs by mouth at bedtime.     Marland Kitchen lisinopril (PRINIVIL,ZESTRIL) 20 MG tablet Take 20 mg by mouth Daily.    . naproxen sodium (ALEVE) 220 MG tablet Take 220 mg by mouth 2 (two) times daily with a meal.    . Omega-3 Fatty Acids (FISH OIL) 1200 MG CAPS Take 1,200 mg by mouth 3 (three) times daily.    Marland Kitchen oxyCODONE-acetaminophen (PERCOCET) 10-325 MG per tablet Take 1 tablet by mouth every 4 (four) hours as needed for pain.      No current facility-administered medications for this visit.    Past Medical History  Diagnosis Date  . Peripheral vascular disease   . Arthritis   . Cancer     skin  . Adenomatous polyps     remote past, on 5 year surveillance  . Hypertension   . COPD (chronic obstructive pulmonary disease)     emphysema  . Pneumonia   . Headache(784.0)     having recently  . Diverticulitis   . Enlarged prostate   . Bell's palsy 40+yrs ago  . Flat affect     Social History History  Substance Use Topics  . Smoking status: Current Every Day Smoker -- 1.00 packs/day for 52 years    Types: Cigarettes  . Smokeless tobacco: Never Used     Comment:  Pt. signed up for the Smoking class at [facility] in May 2016  . Alcohol Use: No    Family History Family History  Problem Relation Age of Onset  . Heart disease Mother     After age 59  . Heart attack Father   . Heart disease Father     Heart Disease before age 15  . Colon cancer Neg Hx     Surgical History Past Surgical History  Procedure Laterality Date  . Spine surgery      lumbar & cervical spine surgeries X 8   . Iliac artery stent  2006    Left CIA stenting  . Colonoscopy  07/30/2005    Internal hemorrhoids; otherwise, normal rectum/ Left-sided diverticula  . Colonoscopy  01/11/2012    Procedure: COLONOSCOPY;  Surgeon: Daneil Dolin, MD;  [facility];  Service: Endoscopy;  Laterality: N/A;  1:15  . Rotator cuff  repair Right     3 different surgeries  . Vasectomy    . Sinus endo w/fusion Left 08/18/2013    Procedure: ENDOSCOPIC SINUS SURGERY WITH FUSION NAVIGATION;  Surgeon: Melissa Montane, MD;  Location: [facility];  Service: ENT;  Laterality: Left;  . Sinus endo w/fusion Left 12/21/2013    Procedure: ENDOSCOPIC SINUS SURGERY WITH FUSION NAVIGATION;  Surgeon: Melissa Montane, MD;  Location: [facility];  Service: ENT;  Laterality: Left;  ESS with fusion left frontal sinus with Rain Stent    No Known Allergies  Current Outpatient Prescriptions  Medication Sig Dispense Refill  . amLODipine (NORVASC) 10 MG tablet Take 10 mg by mouth Daily.    . diazepam (VALIUM) 10 MG tablet Take 10 mg by mouth every 6 (six) hours as needed. Anxiety    . furosemide (LASIX) 20 MG tablet Take 20 mg by mouth daily as needed for fluid.     Marland Kitchen gabapentin (NEURONTIN) 600 MG tablet Take 600 mg by mouth 3 (three) times daily.    Marland Kitchen lactulose (CHRONULAC) 10 GM/15ML solution Take 15-30 mLs by mouth at bedtime.     Marland Kitchen lisinopril (PRINIVIL,ZESTRIL) 20 MG tablet Take 20 mg by mouth Daily.    . naproxen sodium (ALEVE) 220 MG tablet Take 220 mg by mouth 2 (two) times daily with a meal.    . Omega-3 Fatty Acids (FISH OIL) 1200 MG CAPS Take 1,200 mg by mouth 3 (three) times daily.    Marland Kitchen oxyCODONE-acetaminophen (PERCOCET) 10-325 MG per tablet Take 1 tablet by mouth every 4 (four) hours as needed for pain.      No current facility-administered medications for this visit.     REVIEW OF SYSTEMS: See HPI for pertinent positives and negatives.  Physical Examination Filed Vitals:   08/14/14 1232 08/14/14 1236  BP: 96/59 102/59  Pulse: 91 89  Temp:  97.8 F (36.6 C)  TempSrc:  Oral  Resp:  14  Height:  5\' 9"  (1.753 m)  Weight:  212 lb (96.163 kg)  SpO2:  94%   Body mass index is 31.29 kg/(m^2).   General: A&O x 3, WDWN. Gait: normal Eyes: PERRLA. Pulmonary: CTAB, without wheezes , rales or rhonchi. Cardiac: regular Rythm , without detected  murmur.     Carotid Bruits Left Right   positive Negative  Aorta is not palpable. Radial pulses: 2+ palpable and =   VASCULAR EXAM: Extremities without ischemic changes  without Gangrene; without open wounds.     LE Pulses LEFT RIGHT   FEMORAL  palpable  palpable  POPLITEAL not palpable  not palpable   POSTERIOR TIBIAL not palpable  not palpable    DORSALIS PEDIS  ANTERIOR TIBIAL palpable  palpable    Abdomen: soft, NT, no palpable masses. Skin: no rashes, no ulcers. Musculoskeletal: no muscle wasting or atrophy. Neurologic: A&O X 3; Appropriate Affect ; SENSATION: normal; MOTOR FUNCTION: moving all extremities equally, motor strength 5/5 in arms, 4/5 in legs. Speech is fluent/normal. CN 2-12 intact.               Non-Invasive Vascular Imaging (08/06/14):  CEREBROVASCULAR DUPLEX EVALUATION    INDICATION: Left carotid bruit    PREVIOUS INTERVENTION(S):     DUPLEX EXAM:     RIGHT  LEFT  Peak Systolic Velocities (cm/s) End Diastolic Velocities (cm/s) Plaque LOCATION Peak Systolic Velocities (cm/s) End Diastolic Velocities (cm/s) Plaque  112 17  CCA PROXIMAL 125 26   113 19  CCA MID 135 26   106 20 HT CCA DISTAL 125 28 HT  148 18 HT ECA 254 24 HT  88 23 HT ICA PROXIMAL 138 30 HT  81 23  ICA MID 84 24   60 19  ICA DISTAL 54 16     0.83 ICA / CCA Ratio (PSV) 1.1  Antegrade Vertebral Flow Antegrade  322 Brachial Systolic Pressure (mmHg) 025  Multiphasic (subclavian artery) Brachial Artery Waveforms Multiphasic (subclavian artery)    Plaque Morphology:  HM = Homogeneous, HT = Heterogeneous, CP = Calcific Plaque, SP = Smooth Plaque, IP = Irregular Plaque     ADDITIONAL FINDINGS: . No significant stenosis of the  right external or bilateral common carotid arteries. . Left external carotid artery stenosis noted.    IMPRESSION: Doppler velocities suggest less than 40% bilateral proximal internal carotid artery stenoses.    Compared to the previous exam:  No previous duplex exam available for comparison.        ASSESSMENT:  Derek [name] is a 69 y.o. male who is s/p left common iliac artery stent placed in 2006. He has a new left carotid bruit. He has no history of stroke or TIA. Today's carotid Duplex suggests less than 40% bilateral proximal internal carotid artery stenosis. Left external carotid artery stenosis noted which is the likely source of the left carotid bruit. His chief atherosclerotic risk factor is his ppd smoking, fortunately he does not have DM. His wife states that he has registered for a smoking cessation class.    PLAN:   Based on today's exam and non-invasive vascular lab results, the patient will follow up in a year as already scheduled with the following tests: ABI's and left iliac artery stent Duplex, will add carotid Duplex.  I discussed in depth with the patient the nature of atherosclerosis, and emphasized the importance of maximal medical management including strict control of blood pressure, blood glucose, and lipid levels, obtaining regular exercise, and cessation of smoking.  The patient is aware that without maximal medical management the underlying atherosclerotic disease process will progress, limiting the benefit of any interventions.  The patient was given information about stroke prevention and what symptoms should prompt the patient to seek immediate medical care.  Thank you for allowing [place] to participate in this patient's care.  Clemon Chambers, RN, MSN, FNP-C [facility] Office: [phone]  Clinic MD: Early  08/14/2014 12:48 PM

## 2015-05-14 DIAGNOSIS — Z1389 Encounter for screening for other disorder: Secondary | ICD-10-CM | POA: Diagnosis not present

## 2015-05-14 DIAGNOSIS — G894 Chronic pain syndrome: Secondary | ICD-10-CM | POA: Diagnosis not present

## 2015-05-14 DIAGNOSIS — M5136 Other intervertebral disc degeneration, lumbar region: Secondary | ICD-10-CM | POA: Diagnosis not present

## 2015-05-14 DIAGNOSIS — Z6830 Body mass index (BMI) 30.0-30.9, adult: Secondary | ICD-10-CM | POA: Diagnosis not present

## 2015-07-05 ENCOUNTER — Encounter (HOSPITAL_COMMUNITY): Payer: Self-pay | Admitting: *Deleted

## 2015-07-05 ENCOUNTER — Emergency Department (HOSPITAL_COMMUNITY)
Admission: EM | Admit: 2015-07-05 | Discharge: 2015-07-05 | Disposition: A | Discharge status: Home / Self Care | Payer: PPO | Attending: Emergency Medicine | Admitting: Emergency Medicine

## 2015-07-05 ENCOUNTER — Emergency Department (HOSPITAL_COMMUNITY): Payer: PPO

## 2015-07-05 DIAGNOSIS — M4326 Fusion of spine, lumbar region: Secondary | ICD-10-CM | POA: Diagnosis not present

## 2015-07-05 DIAGNOSIS — Z8701 Personal history of pneumonia (recurrent): Secondary | ICD-10-CM | POA: Diagnosis not present

## 2015-07-05 DIAGNOSIS — R4182 Altered mental status, unspecified: Secondary | ICD-10-CM | POA: Diagnosis not present

## 2015-07-05 DIAGNOSIS — M5126 Other intervertebral disc displacement, lumbar region: Secondary | ICD-10-CM

## 2015-07-05 DIAGNOSIS — Z79899 Other long term (current) drug therapy: Secondary | ICD-10-CM | POA: Diagnosis not present

## 2015-07-05 DIAGNOSIS — M1990 Unspecified osteoarthritis, unspecified site: Secondary | ICD-10-CM | POA: Insufficient documentation

## 2015-07-05 DIAGNOSIS — M25551 Pain in right hip: Secondary | ICD-10-CM | POA: Diagnosis not present

## 2015-07-05 DIAGNOSIS — I739 Peripheral vascular disease, unspecified: Secondary | ICD-10-CM | POA: Insufficient documentation

## 2015-07-05 DIAGNOSIS — F17210 Nicotine dependence, cigarettes, uncomplicated: Secondary | ICD-10-CM | POA: Insufficient documentation

## 2015-07-05 DIAGNOSIS — I10 Essential (primary) hypertension: Secondary | ICD-10-CM | POA: Insufficient documentation

## 2015-07-05 DIAGNOSIS — R1030 Lower abdominal pain, unspecified: Secondary | ICD-10-CM | POA: Diagnosis not present

## 2015-07-05 DIAGNOSIS — J449 Chronic obstructive pulmonary disease, unspecified: Secondary | ICD-10-CM | POA: Diagnosis not present

## 2015-07-05 DIAGNOSIS — Z1389 Encounter for screening for other disorder: Secondary | ICD-10-CM | POA: Diagnosis not present

## 2015-07-05 DIAGNOSIS — M549 Dorsalgia, unspecified: Secondary | ICD-10-CM

## 2015-07-05 DIAGNOSIS — Z681 Body mass index (BMI) 19 or less, adult: Secondary | ICD-10-CM | POA: Diagnosis not present

## 2015-07-05 DIAGNOSIS — G894 Chronic pain syndrome: Secondary | ICD-10-CM | POA: Diagnosis not present

## 2015-07-05 LAB — BASIC METABOLIC PANEL
Anion gap: 9 (ref 5–15)
BUN: 11 mg/dL (ref 6–20)
CO2: 27 mmol/L (ref 22–32)
CREATININE: 0.78 mg/dL (ref 0.61–1.24)
Calcium: 8.9 mg/dL (ref 8.9–10.3)
Chloride: 101 mmol/L (ref 101–111)
GFR calc Af Amer: >60 mL/min (ref >60)
GFR calc non Af Amer: >60 mL/min (ref >60)
Glucose, Bld: 113 mg/dL — ABNORMAL HIGH (ref 65–99)
Potassium: 4.4 mmol/L (ref 3.5–5.1)
Sodium: 137 mmol/L (ref 135–145)

## 2015-07-05 LAB — CBC WITH DIFFERENTIAL/PLATELET
Basophils Absolute: 0.1 10*3/uL (ref 0.0–0.1)
Basophils Relative: 1 %
EOS PCT: 1 %
Eosinophils Absolute: 0.1 10*3/uL (ref 0.0–0.7)
HEMATOCRIT: 40.5 % (ref 39.0–52.0)
Hemoglobin: 13.3 g/dL (ref 13.0–17.0)
Lymphocytes Relative: 26 %
Lymphs Abs: 2.3 10*3/uL (ref 0.7–4.0)
MCH: 33.3 pg (ref 26.0–34.0)
MCHC: 32.8 g/dL (ref 30.0–36.0)
MCV: 101.3 fL — ABNORMAL HIGH (ref 78.0–100.0)
MONO ABS: 0.7 10*3/uL (ref 0.1–1.0)
MONOS PCT: 8 %
NEUTROS ABS: 5.9 10*3/uL (ref 1.7–7.7)
Neutrophils Relative %: 64 %
Platelets: 375 10*3/uL (ref 150–400)
RBC: 4 MIL/uL — ABNORMAL LOW (ref 4.22–5.81)
RDW: 15.3 % (ref 11.5–15.5)
WBC: 9.1 10*3/uL (ref 4.0–10.5)

## 2015-07-05 LAB — URINALYSIS, ROUTINE W REFLEX MICROSCOPIC
Bilirubin Urine: NEGATIVE
GLUCOSE, UA: NEGATIVE mg/dL
HGB URINE DIPSTICK: NEGATIVE
KETONES UR: NEGATIVE mg/dL
Leukocytes, UA: NEGATIVE
Nitrite: NEGATIVE
PH: 6 (ref 5.0–8.0)
Protein, ur: NEGATIVE mg/dL
Specific Gravity, Urine: <1.005 — ABNORMAL LOW (ref 1.005–1.030)

## 2015-07-05 MED ORDER — PREDNISONE 10 MG (21) PO TBPK: 10.0000 mg | ORAL_TABLET | Freq: Every day | ORAL | Status: DC

## 2015-07-05 MED ORDER — GADOBENATE DIMEGLUMINE 529 MG/ML IV SOLN
20.0000 mL | Freq: Once | INTRAVENOUS | Status: AC | PRN
  Administered 2015-07-05: 20 mL via INTRAVENOUS

## 2015-07-05 MED ORDER — HYDROCODONE-ACETAMINOPHEN 5-325 MG PO TABS
1.0000 | ORAL_TABLET | Freq: Once | ORAL | Status: AC
  Administered 2015-07-05: 1 via ORAL
  Filled 2015-07-05: qty 1

## 2015-07-05 NOTE — ED Notes (Signed)
Pt transported to Radiology 

## 2015-07-05 NOTE — ED Notes (Signed)
Pt back to Radiology for MRI.

## 2015-07-05 NOTE — ED Notes (Signed)
Pt still in MRI. Wife at bedside, given something to drink. Denies other needs at present.

## 2015-07-05 NOTE — Discharge Instructions (Signed)
Your MRI does show herniated disc in the low part of your spine. Please call Dr. Arnoldo Morale office on Monday to set up follow-up within 1 week. Take steroid as prescribed. Continue to take her home Percocet as needed for pain control. Return for worsening symptoms, including loss of control of your urine or bowel, urinary retention, new numbness or weakness, or any other symptoms concerning to you.  Back Pain, Adult Back pain is very common. The pain often gets better over time. The cause of back pain is usually not dangerous. Most people can learn to manage their back pain on their own.  HOME CARE  Watch your back pain for any changes. The following actions may help to lessen any pain you are feeling:  Stay active. Start with short walks on flat ground if you can. Try to walk farther each day.  Exercise regularly as told by your doctor. Exercise helps your back heal faster. It also helps avoid future injury by keeping your muscles strong and flexible.  Do not sit, drive, or stand in one place for more than 30 minutes.  Do not stay in bed. Resting more than 1-2 days can slow down your recovery.  Be careful when you bend or lift an object. Use good form when lifting:  Bend at your knees.  Keep the object close to your body.  Do not twist.  Sleep on a firm mattress. Lie on your side, and bend your knees. If you lie on your back, put a pillow under your knees.  Take medicines only as told by your doctor.  Put ice on the injured area.  Put ice in a plastic bag.  Place a towel between your skin and the bag.  Leave the ice on for 20 minutes, 2-3 times a day for the first 2-3 days. After that, you can switch between ice and heat packs.  Avoid feeling anxious or stressed. Find good ways to deal with stress, such as exercise.  Maintain a healthy weight. Extra weight puts stress on your back. GET HELP IF:   You have pain that does not go away with rest or medicine.  You have worsening  pain that goes down into your legs or buttocks.  You have pain that does not get better in one week.  You have pain at night.  You lose weight.  You have a fever or chills. GET HELP RIGHT AWAY IF:   You cannot control when you poop (bowel movement) or pee (urinate).  Your arms or legs feel weak.  Your arms or legs lose feeling (numbness).  You feel sick to your stomach (nauseous) or throw up (vomit).  You have belly (abdominal) pain.  You feel like you may pass out (faint).   This information is not intended to replace advice given to you by your health care provider. Make sure you discuss any questions you have with your health care provider.   Document Released: 09/23/2007 Document Revised: 04/27/2014 Document Reviewed: 08/08/2013 Elsevier Interactive Patient Education 2016 [employer]  Herniated Disk A herniated disk occurs when a disk in your spine bulges out too far. Your spine (backbone) is made up of bones called vertebrae. A disk with a spongy center is located between each pair of bones. These disks act as shock absorbers when you move. A herniated disk can cause pain and muscle weakness.  HOME CARE  Take all medicines as told by your doctor.  Rest for 2 days and then start moving.  Do not sit or stand for long periods of time.  Maintain good posture when sitting and standing.  Avoid moving in a way that causes pain, such as bending or lifting.  When you are able to start lifting things again:  [place] with your knees.  Keep your back straight.  Hold heavy objects close to your body.  If you are overweight, ask your doctor about starting a weight-loss program.  When you are able to start exercising, ask your doctor how much and what type of exercise is best for you.  Work with a physical therapist on stretching and strengthening exercises for your back.  Do not wear high-heeled shoes.  Do not sleep on your belly.  Do not smoke.  Keep all  follow-up visits as told by your doctor. GET HELP IF:  You have back or neck pain that is not getting better after 4 weeks.  You have very bad pain in your back or neck.  You have a loss of feeling (numbness), tingling, or weakness along with pain. GET HELP RIGHT AWAY IF:  You have tingling, weakness, or loss of feeling that makes you unable to use your arms or legs.  You are not able to control when you pee (urinate) or poop (bowel movement).  You have dizziness or fainting.  You have shortness of breath. MAKE SURE YOU:  Understand these instructions.  Will watch your condition.  Will get help right away if you are not doing well or get worse.   This information is not intended to replace advice given to you by your health care provider. Make sure you discuss any questions you have with your health care provider.   Document Released: 08/21/2013 Document Reviewed: 08/21/2013 Elsevier Interactive Patient Education [employer].

## 2015-07-05 NOTE — ED Notes (Addendum)
Pt reports right groin and right hip pain since last week. Reports difficulty walking. Sent here by PCP. Family denies any changes in speech or mentation. Patient alert and oriented.

## 2015-07-05 NOTE — ED Provider Notes (Signed)
CSN: [id number]     Arrival date & time 07/05/15  1107 History   First MD Initiated Contact with Patient 07/05/15 1413     Chief Complaint  Patient presents with  . Groin Pain     (Consider location/radiation/quality/duration/timing/severity/associated sxs/prior Treatment) HPI 70 year old male who presents with right hip and back pain. States that this is been progressive over the past several years, which she has been followed remotely in the past by Dr. Edmonia Lynch from orthopedic surgery and Dr. Arnoldo Morale from neurosurgery. States that over the past week, he has been having worsening pain with difficulty with ambulation. Normally ambulates without assistance at baseline, but this week has needed a walker. Does not have any new numbness but does feel subjectively weak in the right lower extremity, but states he may be limited by pain. Has not had any new trauma or falls. Denies urinary retention, bowel incontinence, saddle anesthesia, fevers or chills.   Past Medical History  Diagnosis Date  . Peripheral vascular disease ([place])   . Arthritis   . Adenomatous polyps     remote past, on 5 year surveillance  . Hypertension   . COPD (chronic obstructive pulmonary disease) ([facility])     emphysema  . Pneumonia   . Headache(784.0)     having recently  . Diverticulitis   . Enlarged prostate   . Bell's palsy 40+yrs ago  . Flat affect   . Cancer [facility])     skin   Past Surgical History  Procedure Laterality Date  . Spine surgery      lumbar & cervical spine surgeries X 8   . Iliac artery stent  2006    Left CIA stenting  . Colonoscopy  07/30/2005    Internal hemorrhoids; otherwise, normal rectum/ Left-sided diverticula  . Colonoscopy  01/11/2012    Procedure: COLONOSCOPY;  Surgeon: Daneil Dolin, MD;  Location[facility];  Service: Endoscopy;  Laterality: N/A;  1:15  . Rotator cuff repair Right     3 different surgeries  . Vasectomy    . Sinus endo w/fusion Left 08/18/2013    Procedure:  ENDOSCOPIC SINUS SURGERY WITH FUSION NAVIGATION;  Surgeon: Melissa Montane, MD;  Location: [place];  Service: ENT;  Laterality: Left;  . Sinus endo w/fusion Left 12/21/2013    Procedure: ENDOSCOPIC SINUS SURGERY WITH FUSION NAVIGATION;  Surgeon: Melissa Montane, MD;  Location: [facility] OR;  Service: ENT;  Laterality: Left;  ESS with fusion left frontal sinus with Rain Stent   Family History  Problem Relation Age of Onset  . Heart disease Mother     After age 55  . Heart attack Father   . Heart disease Father     Heart Disease before age 9  . Colon cancer Neg Hx    Social History  Substance Use Topics  . Smoking status: Current Every Day Smoker -- 1.00 packs/day for 52 years    Types: Cigarettes  . Smokeless tobacco: Never Used     Comment: Pt. signed up for the Smoking class at [facility] in May 2016  . Alcohol Use: No    Review of Systems 10/14 systems reviewed and are negative other than those stated in the HPI    Allergies  Review of patient's allergies indicates no known allergies.  Home Medications   Prior to Admission medications   Medication Sig Start Date End Date Taking? Authorizing Provider  amLODipine (NORVASC) 10 MG tablet Take 10 mg by mouth Daily. 08/31/11  Yes Historical  Provider, MD  diazepam (VALIUM) 10 MG tablet Take 10 mg by mouth every 6 (six) hours as needed. Anxiety   Yes Historical Provider, MD  furosemide (LASIX) 20 MG tablet Take 20 mg by mouth daily as needed for fluid.  06/29/11  Yes Historical Provider, MD  gabapentin (NEURONTIN) 600 MG tablet Take 600 mg by mouth 3 (three) times daily.   Yes Historical Provider, MD  lactulose (CHRONULAC) 10 GM/15ML solution Take 15-30 mLs by mouth at bedtime.  07/24/13  Yes Historical Provider, MD  lisinopril (PRINIVIL,ZESTRIL) 20 MG tablet Take 20 mg by mouth Daily. 09/07/11  Yes Historical Provider, MD  naproxen sodium (ALEVE) 220 MG tablet Take 220 mg by mouth 2 (two) times daily with a meal.   Yes Historical Provider, MD  Omega-3  Fatty Acids (FISH OIL) 1200 MG CAPS Take 1,200 mg by mouth 3 (three) times daily.   Yes Historical Provider, MD  oxyCODONE-acetaminophen (PERCOCET) 10-325 MG per tablet Take 1 tablet by mouth every 4 (four) hours as needed for pain.    Yes Historical Provider, MD  predniSONE (STERAPRED UNI-PAK 21 TAB) 10 MG (21) TBPK tablet Take 1 tablet (10 mg total) by mouth daily. Take 6 tabs by mouth daily  for 2 days, then 5 tabs for 2 days, then 4 tabs for 2 days, then 3 tabs for 2 days, 2 tabs for 2 days, then 1 tab by mouth daily for 2 days 07/05/15   [name], MD   BP 135/53 mmHg  Pulse 101  Temp(Src) 97.9 F (36.6 C) (Oral)  Resp 18  Ht 5\' 11"  (1.803 m)  Wt 212 lb (96.163 kg)  BMI 29.58 kg/m2  SpO2 92% Physical Exam Physical Exam  Nursing note and vitals reviewed. Constitutional: Well developed, well nourished, non-toxic, and in no acute distress Head: Normocephalic and atraumatic.  Mouth/Throat: Oropharynx is clear and moist.  Neck: Normal range of motion. Neck supple.  Cardiovascular: Normal rate and regular rhythm.   Pulmonary/Chest: Effort normal and breath sounds normal.  Abdominal: Soft. There is no tenderness. There is no rebound and no guarding.  Musculoskeletal: Tenderness to palpation to low lumbar spine and movement of the right hip.  Neurological: Alert, no facial droop, fluent speech Sensation to light touch in tact in bilateral lower extremities Motor exam somewhat limited by pain. Overall in tact motor in bilateral hip flexion/extension, hip abduction/adduction, knee extension/flexion, and ankle dorsi/plantarflexion Skin: Skin is warm and dry.  Psychiatric: Cooperative  ED Course  Procedures (including critical care time) Labs Review Labs Reviewed  CBC WITH DIFFERENTIAL/PLATELET - Abnormal; Notable for the following:    RBC 4.00 (*)    MCV 101.3 (*)    All other components within normal limits  BASIC METABOLIC PANEL - Abnormal; Notable for the following:    Glucose,  Bld 113 (*)    All other components within normal limits  URINALYSIS, ROUTINE W REFLEX MICROSCOPIC (NOT AT [facility]) - Abnormal; Notable for the following:    Specific Gravity, Urine <1.005 (*)    All other components within normal limits    Imaging Review Mr Lumbar Spine W Wo Contrast  07/05/2015  CLINICAL DATA:  Severe back pain asymmetric to the right for 1 week. Previous lumbar fusion from L2-S1. EXAM: MRI LUMBAR SPINE WITHOUT AND WITH CONTRAST TECHNIQUE: Multiplanar and multiecho pulse sequences of the lumbar spine were obtained without and with intravenous contrast. CONTRAST:  38mL MULTIHANCE GADOBENATE DIMEGLUMINE 529 MG/ML IV SOLN COMPARISON:  CT scan dated 06/20/2014  and MRI dated 05/26/2013 FINDINGS: Conus tip is at L1. Paraspinal soft tissues are normal except for postsurgical changes in the lower lumbar spine. T11-12: 1 mm retrolisthesis with a small broad-based disc bulge. Slight hypertrophy of the ligamentum flavum narrows the spinal canal. Spinal cord is not compressed. T12-L1: Schmorl's node and old slight compression deformity anterior superior aspect of L1. Minimal right foraminal disc bulge with no neural impingement. L1-2: 7 mm retrolisthesis of L1 on L2, increased since the prior study with a broad-based soft disc protrusion creating severe compression of the thecal sac. The extrusion extends inferiorly behind the vertebral body in the midline and asymmetric to the left severely compressing the thecal sac and nerve roots particularly on the left. New prominent degenerative changes of the vertebral endplates with edema in the posterior aspects of the L1 and L2 vertebra. L2-3 through L5-S1: Solid interbody fusions at each level with posterior decompression with no residual or new neural impingement. No change since the prior study. IMPRESSION: 1. New severe compression of the thecal sac at L1-2 due to increased retrolisthesis and increased broad-based disc protrusion as well as a soft disc  extrusion central and to the left. Slight narrowing of the spinal canal at T11-12. Solid stable fusion from L2-S1. Electronically Signed   By: Lorriane Shire M.D.   On: 07/05/2015 17:32   Dg Hip Unilat With Pelvis Min 4 Views Right  07/05/2015  CLINICAL DATA:  Pain in the posterior right hip radiating into groin and towards medial aspect of right femur for 3 years. No known injury. EXAM: DG HIP (WITH OR WITHOUT PELVIS) 4+V RIGHT COMPARISON:  None FINDINGS: Mixed sclerotic and lucent foci within each femoral head, right greater the left, compatible with chronic avascular necrosis. No associated articular surface collapse. No fracture line or displaced fracture fragment seen within either femoral head or femoral neck. No large osteophytes or other signs of advanced degenerative joint disease at either hip. Fixation hardware is partially imaged within the lower lumbar spine. Osseous structures of the pelvis appear normally aligned and normal in mineralization, partially obscured by overlying bowel gas and stool. Soft tissues about the pelvis and right hip are unremarkable. IMPRESSION: 1. 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 Milder changes of chronic avascular necrosis within the left femoral head. 2. No acute findings. Electronically Signed   By: Franki Cabot M.D.   On: 07/05/2015 15:54   I have personally reviewed and evaluated these images and lab results as part of my medical decision-making.   EKG Interpretation None      MDM   Final diagnoses:  Right hip pain  Back pain  Lumbar disc herniation    70 year old male who presents with progressively worsening low back pain, right hip pain radiating down the right lower extremity over the course of the past week. He is nontoxic and in no acute distress. He is grossly neurologically intact in bilateral lower extremities. 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 Due to acute changes in his ambulation recently and MRI was performed. This shows severe disc herniation in the upper lumbar spine with severe compression of the thecal sac. No spinal cord involvement. Discussed with Dr. Arnoldo Morale, who recommended steroid burst. He will follow-up with patient in clinic within 1 week. Patient will continue to have management by his orthopedic surgeon regarding his right hip AVN.  He is felt appropriate for discharge home. Strict return and follow-up instructions are reviewed. He expressed understanding of all discharge instructions, and felt comfortable with the plan of care  [name], MD 07/05/15 2243

## 2015-07-11 DIAGNOSIS — M87052 Idiopathic aseptic necrosis of left femur: Secondary | ICD-10-CM | POA: Diagnosis not present

## 2015-07-11 DIAGNOSIS — M87051 Idiopathic aseptic necrosis of right femur: Secondary | ICD-10-CM | POA: Diagnosis not present

## 2015-07-11 DIAGNOSIS — M25551 Pain in right hip: Secondary | ICD-10-CM | POA: Diagnosis not present

## 2015-07-11 DIAGNOSIS — G8929 Other chronic pain: Secondary | ICD-10-CM | POA: Diagnosis not present

## 2015-07-12 ENCOUNTER — Encounter: Payer: Self-pay | Admitting: Family

## 2015-07-22 ENCOUNTER — Other Ambulatory Visit: Payer: Self-pay | Admitting: *Deleted

## 2015-07-22 DIAGNOSIS — Z9862 Peripheral vascular angioplasty status: Secondary | ICD-10-CM

## 2015-07-22 DIAGNOSIS — I6523 Occlusion and stenosis of bilateral carotid arteries: Secondary | ICD-10-CM

## 2015-07-23 ENCOUNTER — Ambulatory Visit: Payer: PPO | Admitting: Family

## 2015-07-23 ENCOUNTER — Encounter (HOSPITAL_COMMUNITY): Payer: PPO

## 2015-07-23 ENCOUNTER — Ambulatory Visit (INDEPENDENT_AMBULATORY_CARE_PROVIDER_SITE_OTHER): Payer: PPO | Admitting: Family

## 2015-07-23 ENCOUNTER — Encounter: Payer: Self-pay | Admitting: Family

## 2015-07-23 ENCOUNTER — Ambulatory Visit (HOSPITAL_COMMUNITY)
Admission: RE | Admit: 2015-07-23 | Discharge: 2015-07-23 | Disposition: A | Discharge status: Home / Self Care | Payer: PPO | Source: Ambulatory Visit | Attending: Family | Admitting: Family

## 2015-07-23 ENCOUNTER — Ambulatory Visit (INDEPENDENT_AMBULATORY_CARE_PROVIDER_SITE_OTHER)
Admission: RE | Admit: 2015-07-23 | Discharge: 2015-07-23 | Disposition: A | Discharge status: Home / Self Care | Payer: PPO | Source: Ambulatory Visit | Attending: Family | Admitting: Family

## 2015-07-23 ENCOUNTER — Other Ambulatory Visit (HOSPITAL_COMMUNITY): Payer: PPO

## 2015-07-23 VITALS — BP 143/86 | HR 85 | Temp 97.6°F | Resp 16 | Ht 71.0 in | Wt 204.0 lb

## 2015-07-23 DIAGNOSIS — I10 Essential (primary) hypertension: Secondary | ICD-10-CM | POA: Insufficient documentation

## 2015-07-23 DIAGNOSIS — I700 Atherosclerosis of aorta: Secondary | ICD-10-CM | POA: Insufficient documentation

## 2015-07-23 DIAGNOSIS — I739 Peripheral vascular disease, unspecified: Secondary | ICD-10-CM

## 2015-07-23 DIAGNOSIS — I6523 Occlusion and stenosis of bilateral carotid arteries: Secondary | ICD-10-CM

## 2015-07-23 DIAGNOSIS — F17200 Nicotine dependence, unspecified, uncomplicated: Secondary | ICD-10-CM

## 2015-07-23 DIAGNOSIS — Z9862 Peripheral vascular angioplasty status: Secondary | ICD-10-CM | POA: Insufficient documentation

## 2015-07-23 DIAGNOSIS — Z720 Tobacco use: Secondary | ICD-10-CM

## 2015-07-23 DIAGNOSIS — Z95828 Presence of other vascular implants and grafts: Secondary | ICD-10-CM

## 2015-07-23 DIAGNOSIS — R0989 Other specified symptoms and signs involving the circulatory and respiratory systems: Secondary | ICD-10-CM

## 2015-07-23 DIAGNOSIS — Z4889 Encounter for other specified surgical aftercare: Secondary | ICD-10-CM

## 2015-07-23 DIAGNOSIS — Z48812 Encounter for surgical aftercare following surgery on the circulatory system: Secondary | ICD-10-CM

## 2015-07-23 NOTE — Patient Instructions (Signed)
Peripheral Vascular Disease Peripheral vascular disease (PVD) is a disease of the blood vessels that are not part of your heart and brain. A simple term for PVD is poor circulation. In most cases, PVD narrows the blood vessels that carry blood from your heart to the rest of your body. This can result in a decreased supply of blood to your arms, legs, and internal organs, like your stomach or kidneys. However, it most often affects a person's lower legs and feet. There are two types of PVD.  Organic PVD. This is the more common type. It is caused by damage to the structure of blood vessels.  Functional PVD. This is caused by conditions that make blood vessels contract and tighten (spasm). Without treatment, PVD tends to get worse over time. PVD can also lead to acute ischemic limb. This is when an arm or limb suddenly has trouble getting enough blood. This is a medical emergency. CAUSES Each type of PVD has many different causes. The most common cause of PVD is buildup of a fatty material (plaque) inside of your arteries (atherosclerosis). Small amounts of plaque can break off from the walls of the blood vessels and become lodged in a smaller artery. This blocks blood flow and can cause acute ischemic limb. Other common causes of PVD include:  Blood clots that form inside of blood vessels.  Injuries to blood vessels.  Diseases that cause inflammation of blood vessels or cause blood vessel spasms.  Health behaviors and health history that increase your risk of developing PVD. RISK FACTORS  You may have a greater risk of PVD if you:  Have a family history of PVD.  Have certain medical conditions, including:  High cholesterol.  Diabetes.  High blood pressure (hypertension).  Coronary heart disease.  Past problems with blood clots.  Past injury, such as burns or a broken bone. These may have damaged blood vessels in your limbs.  Buerger disease. This is caused by inflamed blood  vessels in your hands and feet.  Some forms of arthritis.  Rare birth defects that affect the arteries in your legs.  Use tobacco.  Do not get enough exercise.  Are obese.  Are age 50 or older. SIGNS AND SYMPTOMS  PVD may cause many different symptoms. Your symptoms depend on what part of your body is not getting enough blood. Some common signs and symptoms include:  Cramps in your lower legs. This may be a symptom of poor leg circulation (claudication).  Pain and weakness in your legs while you are physically active that goes away when you rest (intermittent claudication).  Leg pain when at rest.  Leg numbness, tingling, or weakness.  Coldness in a leg or foot, especially when compared with the other leg.  Skin or hair changes. These can include:  Hair loss.  Shiny skin.  Pale or bluish skin.  Thick toenails.  Inability to get or maintain an erection (erectile dysfunction). People with PVD are more prone to developing ulcers and sores on their toes, feet, or legs. These may take longer than normal to heal. DIAGNOSIS Your health care provider may diagnose PVD from your signs and symptoms. The health care provider will also do a physical exam. You may have tests to find out what is causing your PVD and determine its severity. Tests may include:  Blood pressure recordings from your arms and legs and measurements of the strength of your pulses (pulse volume recordings).  Imaging studies using sound waves to take pictures of   the blood flow through your blood vessels (Doppler ultrasound).  Injecting a dye into your blood vessels before having imaging studies using:  X-rays (angiogram or arteriogram).  Computer-generated X-rays (CT angiogram).  A powerful electromagnetic field and a computer (magnetic resonance angiogram or MRA). TREATMENT Treatment for PVD depends on the cause of your condition and the severity of your symptoms. It also depends on your age. Underlying  causes need to be treated and controlled. These include long-lasting (chronic) conditions, such as diabetes, high cholesterol, and high blood pressure. You may need to first try making lifestyle changes and taking medicines. Surgery may be needed if these do not work. Lifestyle changes may include:  Quitting smoking.  Exercising regularly.  Following a low-fat, low-cholesterol diet. Medicines may include:  Blood thinners to prevent blood clots.  Medicines to improve blood flow.  Medicines to improve your blood cholesterol levels. Surgical procedures may include:  A procedure that uses an inflated balloon to open a blocked artery and improve blood flow (angioplasty).  A procedure to put in a tube (stent) to keep a blocked artery open (stent implant).  Surgery to reroute blood flow around a blocked artery (peripheral bypass surgery).  Surgery to remove dead tissue from an infected wound on the affected limb.  Amputation. This is surgical removal of the affected limb. This may be necessary in cases of acute ischemic limb that are not improved through medical or surgical treatments. HOME CARE INSTRUCTIONS  Take medicines only as directed by your health care provider.  Do not use any tobacco products, including cigarettes, chewing tobacco, or electronic cigarettes. If you need help quitting, ask your health care provider.  Lose weight if you are overweight, and maintain a healthy weight as directed by your health care provider.  Eat a diet that is low in fat and cholesterol. If you need help, ask your health care provider.  Exercise regularly. Ask your health care provider to suggest some good activities for you.  Use compression stockings or other mechanical devices as directed by your health care provider.  Take good care of your feet.  Wear comfortable shoes that fit well.  Check your feet often for any cuts or sores. SEEK MEDICAL CARE IF:  You have cramps in your legs  while walking.  You have leg pain when you are at rest.  You have coldness in a leg or foot.  Your skin changes.  You have erectile dysfunction.  You have cuts or sores on your feet that are not healing. SEEK IMMEDIATE MEDICAL CARE IF:  Your arm or leg turns cold and blue.  Your arms or legs become red, warm, swollen, painful, or numb.  You have chest pain or trouble breathing.  You suddenly have weakness in your face, arm, or leg.  You become very confused or lose the ability to speak.  You suddenly have a very bad headache or lose your vision.   This information is not intended to replace advice given to you by your health care provider. Make sure you discuss any questions you have with your health care provider.   Document Released: 05/14/2004 Document Revised: 04/27/2014 Document Reviewed: 09/14/2013 Elsevier Interactive Patient Education 2016 [employer]    Stroke Prevention Some medical conditions and behaviors are associated with an increased chance of having a stroke. You may prevent a stroke by making healthy choices and managing medical conditions. HOW CAN I REDUCE MY RISK OF HAVING A STROKE?   Stay physically active. Get at   least 30 minutes of activity on most or all days.  Do not smoke. It may also be helpful to avoid exposure to secondhand smoke.  Limit alcohol use. Moderate alcohol use is considered to be:  No more than 2 drinks per day for men.  No more than 1 drink per day for nonpregnant women.  Eat healthy foods. This involves:  Eating 5 or more servings of fruits and vegetables a day.  Making dietary changes that address high blood pressure (hypertension), high cholesterol, diabetes, or obesity.  Manage your cholesterol levels.  Making food choices that are high in fiber and low in saturated fat, trans fat, and cholesterol may control cholesterol levels.  Take any prescribed medicines to control cholesterol as directed by your health care  provider.  Manage your diabetes.  Controlling your carbohydrate and sugar intake is recommended to manage diabetes.  Take any prescribed medicines to control diabetes as directed by your health care provider.  Control your hypertension.  Making food choices that are low in salt (sodium), saturated fat, trans fat, and cholesterol is recommended to manage hypertension.  Ask your health care provider if you need treatment to lower your blood pressure. Take any prescribed medicines to control hypertension as directed by your health care provider.  If you are 18-39 years of age, have your blood pressure checked every 3-5 years. If you are 40 years of age or older, have your blood pressure checked every year.  Maintain a healthy weight.  Reducing calorie intake and making food choices that are low in sodium, saturated fat, trans fat, and cholesterol are recommended to manage weight.  Stop drug abuse.  Avoid taking birth control pills.  Talk to your health care provider about the risks of taking birth control pills if you are over 35 years old, smoke, get migraines, or have ever had a blood clot.  Get evaluated for sleep disorders (sleep apnea).  Talk to your health care provider about getting a sleep evaluation if you snore a lot or have excessive sleepiness.  Take medicines only as directed by your health care provider.  For some people, aspirin or blood thinners (anticoagulants) are helpful in reducing the risk of forming abnormal blood clots that can lead to stroke. If you have the irregular heart rhythm of atrial fibrillation, you should be on a blood thinner unless there is a good reason you cannot take them.  Understand all your medicine instructions.  Make sure that other conditions (such as anemia or atherosclerosis) are addressed. SEEK IMMEDIATE MEDICAL CARE IF:   You have sudden weakness or numbness of the face, arm, or leg, especially on one side of the body.  Your face  or eyelid droops to one side.  You have sudden confusion.  You have trouble speaking (aphasia) or understanding.  You have sudden trouble seeing in one or both eyes.  You have sudden trouble walking.  You have dizziness.  You have a loss of balance or coordination.  You have a sudden, severe headache with no known cause.  You have new chest pain or an irregular heartbeat. Any of these symptoms may represent a serious problem that is an emergency. Do not wait to see if the symptoms will go away. Get medical help at once. Call your local emergency services (911 in [place] Do not drive yourself to the hospital.   This information is not intended to replace advice given to you by your health care provider. Make sure you discuss any questions   you have with your health care provider.   Document Released: 05/14/2004 Document Revised: 04/27/2014 Document Reviewed: 10/07/2012 Elsevier Interactive Patient Education 2016 Elsevier Inc.     Steps to Quit Smoking  Smoking tobacco can be harmful to your health and can affect almost every organ in your body. Smoking puts you, and those around you, at risk for developing many serious chronic diseases. Quitting smoking is difficult, but it is one of the best things that you can do for your health. It is never too late to quit. WHAT ARE THE BENEFITS OF QUITTING SMOKING? When you quit smoking, you lower your risk of developing serious diseases and conditions, such as:  Lung cancer or lung disease, such as COPD.  Heart disease.  Stroke.  Heart attack.  Infertility.  Osteoporosis and bone fractures. Additionally, symptoms such as coughing, wheezing, and shortness of breath may get better when you quit. You may also find that you get sick less often because your body is stronger at fighting off colds and infections. If you are pregnant, quitting smoking can help to reduce your chances of having a baby of low birth weight. HOW DO I GET READY TO  QUIT? When you decide to quit smoking, create a plan to make sure that you are successful. Before you quit:  Pick a date to quit. Set a date within the next two weeks to give you time to prepare.  Write down the reasons why you are quitting. Keep this list in places where you will see it often, such as on your bathroom mirror or in your car or wallet.  Identify the people, places, things, and activities that make you want to smoke (triggers) and avoid them. Make sure to take these actions:  Throw away all cigarettes at home, at work, and in your car.  Throw away smoking accessories, such as ashtrays and lighters.  Clean your car and make sure to empty the ashtray.  Clean your home, including curtains and carpets.  Tell your family, friends, and coworkers that you are quitting. Support from your loved ones can make quitting easier.  Talk with your health care provider about your options for quitting smoking.  Find out what treatment options are covered by your health insurance. WHAT STRATEGIES CAN I USE TO QUIT SMOKING?  Talk with your healthcare provider about different strategies to quit smoking. Some strategies include:  Quitting smoking altogether instead of gradually lessening how much you smoke over a period of time. Research shows that quitting "cold turkey" is more successful than gradually quitting.  Attending in-person counseling to help you build problem-solving skills. You are more likely to have success in quitting if you attend several counseling sessions. Even short sessions of 10 minutes can be effective.  Finding resources and support systems that can help you to quit smoking and remain smoke-free after you quit. These resources are most helpful when you use them often. They can include:  Online chats with a counselor.  Telephone quitlines.  Printed self-help materials.  Support groups or group counseling.  Text messaging programs.  Mobile phone  applications.  Taking medicines to help you quit smoking. (If you are pregnant or breastfeeding, talk with your health care provider first.) Some medicines contain nicotine and some do not. Both types of medicines help with cravings, but the medicines that include nicotine help to relieve withdrawal symptoms. Your health care provider may recommend:  Nicotine patches, gum, or lozenges.  Nicotine inhalers or sprays.  Non-nicotine medicine that   taken by mouth. Talk with your health care provider about combining strategies, such as taking medicines while you are also receiving in-person counseling. Using these two strategies together makes you more likely to succeed in quitting than if you used either strategy on its own. If you are pregnant or breastfeeding, talk with your health care provider about finding counseling or other support strategies to quit smoking. Do not take medicine to help you quit smoking unless told to do so by your health care provider. WHAT THINGS CAN I DO TO MAKE IT EASIER TO QUIT? Quitting smoking might feel overwhelming at first, but there is a lot that you can do to make it easier. Take these important actions:  Reach out to your family and friends and ask that they support and encourage you during this time. Call telephone quitlines, reach out to support groups, or work with a counselor for support.  Ask people who smoke to avoid smoking around you.  Avoid places that trigger you to smoke, such as bars, parties, or smoke-break areas at work.  Spend time around people who do not smoke.  Lessen stress in your life, because stress can be a smoking trigger for some people. To lessen stress, try:  Exercising regularly.  Deep-breathing exercises.  Yoga.  Meditating.  Performing a body scan. This involves closing your eyes, scanning your body from head to toe, and noticing which parts of your body are particularly tense. Purposefully relax the muscles in those  areas.  Download or purchase mobile phone or tablet apps (applications) that can help you stick to your quit plan by providing reminders, tips, and encouragement. There are many free apps, such as QuitGuide from the [employer] for Disease Control and Prevention). You can find other support for quitting smoking (smoking cessation) through smokefree.gov and other websites. HOW WILL I FEEL WHEN I QUIT SMOKING? Within the first 24 hours of quitting smoking, you may start to feel some withdrawal symptoms. These symptoms are usually most noticeable 2-3 days after quitting, but they usually do not last beyond 2-3 weeks. Changes or symptoms that you might experience include:  Mood swings.  Restlessness, anxiety, or irritation.  Difficulty concentrating.  Dizziness.  Strong cravings for sugary foods in addition to nicotine.  Mild weight gain.  Constipation.  Nausea.  Coughing or a sore throat.  Changes in how your medicines work in your body.  A depressed mood.  Difficulty sleeping (insomnia). After the first 2-3 weeks of quitting, you may start to notice more positive results, such as:  Improved sense of smell and taste.  Decreased coughing and sore throat.  Slower heart rate.  Lower blood pressure.  Clearer skin.  The ability to breathe more easily.  Fewer sick days. Quitting smoking is very challenging for most people. Do not get discouraged if you are not successful the first time. Some people need to make many attempts to quit before they achieve long-term success. Do your best to stick to your quit plan, and talk with your health care provider if you have any questions or concerns.   This information is not intended to replace advice given to you by your health care provider. Make sure you discuss any questions you have with your health care provider.   Document Released: 03/31/2001 Document Revised: 08/21/2014 Document Reviewed: 08/21/2014 Elsevier Interactive Patient  Education [employer].

## 2015-07-23 NOTE — Progress Notes (Signed)
[facility] [place] HISTORY AND PHYSICAL   MRN : [id number]  History of Present Illness:   Derek [name] is a 70 y.o. male patient of Dr. Donnetta Hutching with a history of left CIA stent x2 in June 2006.  He also has a left carotid bruit with minimal bilateral ICA stenosis on previous carotid duplex. He returns today for follow up.   He denies history of MI or stroke or TIA.  He has been walking more until March of 2017 when he developed low back pain.  Patient has complaints of lateral pelvis/hip pain that radiates into his groin. Patient states he seen several orthopedists and gotten mixed diagnoses, he's been told he needs hip surgery of one type or other versus another physician saying he does not need a hip replacement. The patient also has a history with Dr. Arnoldo Morale of having lumbar fusion. Patient reports no signs or symptoms of claudication with ambulation just hip pain. He was advised that he needs right hip surgery to address the bilateral radiculopathy and low back pain with walking, and right groin pain when sitting. He is hesitating to have the surgery done as his wife told him he could get blood clots. Explained to pt that anticoagulation is used to help prevent this and this will be better explained to him by his orthopedic surgeon.  He denies non-healing wounds.  Pt Diabetic: No Pt smoker: smoker (decreased to 1 ppd, started at age 12 yrs)   Pt meds include: Statin :No, states his cholesterol is good but his triglycerides are high ASA: No, states it makes his stomach bleed Other anticoagulants/antiplatelets: no   Current Outpatient Prescriptions  Medication Sig Dispense Refill  . amLODipine (NORVASC) 10 MG tablet Take 10 mg by mouth Daily.    . diazepam (VALIUM) 10 MG tablet Take 10 mg by mouth every 6 (six) hours as needed. Anxiety    . furosemide (LASIX) 20 MG tablet Take 20 mg by mouth daily as needed for fluid.     Marland Kitchen gabapentin (NEURONTIN) 600 MG  tablet Take 600 mg by mouth 3 (three) times daily.    Marland Kitchen lactulose (CHRONULAC) 10 GM/15ML solution Take 15-30 mLs by mouth at bedtime.     Marland Kitchen lisinopril (PRINIVIL,ZESTRIL) 20 MG tablet Take 20 mg by mouth Daily.    . meloxicam (MOBIC) 15 MG tablet     . naproxen sodium (ALEVE) 220 MG tablet Take 220 mg by mouth 2 (two) times daily with a meal.    . Omega-3 Fatty Acids (FISH OIL) 1200 MG CAPS Take 1,200 mg by mouth 3 (three) times daily.    Marland Kitchen oxyCODONE-acetaminophen (PERCOCET) 10-325 MG per tablet Take 1 tablet by mouth every 4 (four) hours as needed for pain.     . predniSONE (STERAPRED UNI-PAK 21 TAB) 10 MG (21) TBPK tablet Take 1 tablet (10 mg total) by mouth daily. Take 6 tabs by mouth daily  for 2 days, then 5 tabs for 2 days, then 4 tabs for 2 days, then 3 tabs for 2 days, 2 tabs for 2 days, then 1 tab by mouth daily for 2 days (Patient not taking: Reported on 07/23/2015) 42 tablet 0   No current facility-administered medications for this visit.    Past Medical History  Diagnosis Date  . Peripheral vascular disease ([place])   . Arthritis   . Adenomatous polyps     remote past, on 5 year surveillance  . Hypertension   . COPD (chronic obstructive  pulmonary disease) ([place])     emphysema  . Pneumonia   . Headache(784.0)     having recently  . Diverticulitis   . Enlarged prostate   . Bell's palsy 40+yrs ago  . Flat affect   . Cancer [facility])     skin    Social History Social History  Substance Use Topics  . Smoking status: Current Every Day Smoker -- 1.00 packs/day for 52 years    Types: Cigarettes  . Smokeless tobacco: Never Used     Comment: Pt. signed up for the Smoking class at [facility] in May 2016  . Alcohol Use: No    Family History Family History  Problem Relation Age of Onset  . Heart disease Mother     After age 39  . Heart attack Father   . Heart disease Father     Heart Disease before age 21  . Colon cancer Neg Hx     Surgical History Past Surgical History   Procedure Laterality Date  . Spine surgery      lumbar & cervical spine surgeries X 8   . Iliac artery stent  2006    Left CIA stenting  . Colonoscopy  07/30/2005    Internal hemorrhoids; otherwise, normal rectum/ Left-sided diverticula  . Colonoscopy  01/11/2012    Procedure: COLONOSCOPY;  Surgeon: Daneil Dolin, MD;  [facility];  Service: Endoscopy;  Laterality: N/A;  1:15  . Rotator cuff repair Right     3 different surgeries  . Vasectomy    . Sinus endo w/fusion Left 08/18/2013    Procedure: ENDOSCOPIC SINUS SURGERY WITH FUSION NAVIGATION;  Surgeon: Melissa Montane, MD;  Location: [place];  Service: ENT;  Laterality: Left;  . Sinus endo w/fusion Left 12/21/2013    Procedure: ENDOSCOPIC SINUS SURGERY WITH FUSION NAVIGATION;  Surgeon: Melissa Montane, MD;  Location: [place];  Service: ENT;  Laterality: Left;  ESS with fusion left frontal sinus with Rain Stent    No Known Allergies  Current Outpatient Prescriptions  Medication Sig Dispense Refill  . amLODipine (NORVASC) 10 MG tablet Take 10 mg by mouth Daily.    . diazepam (VALIUM) 10 MG tablet Take 10 mg by mouth every 6 (six) hours as needed. Anxiety    . furosemide (LASIX) 20 MG tablet Take 20 mg by mouth daily as needed for fluid.     Marland Kitchen gabapentin (NEURONTIN) 600 MG tablet Take 600 mg by mouth 3 (three) times daily.    Marland Kitchen lactulose (CHRONULAC) 10 GM/15ML solution Take 15-30 mLs by mouth at bedtime.     Marland Kitchen lisinopril (PRINIVIL,ZESTRIL) 20 MG tablet Take 20 mg by mouth Daily.    . meloxicam (MOBIC) 15 MG tablet     . naproxen sodium (ALEVE) 220 MG tablet Take 220 mg by mouth 2 (two) times daily with a meal.    . Omega-3 Fatty Acids (FISH OIL) 1200 MG CAPS Take 1,200 mg by mouth 3 (three) times daily.    Marland Kitchen oxyCODONE-acetaminophen (PERCOCET) 10-325 MG per tablet Take 1 tablet by mouth every 4 (four) hours as needed for pain.     . predniSONE (STERAPRED UNI-PAK 21 TAB) 10 MG (21) TBPK tablet Take 1 tablet (10 mg total) by mouth daily. Take  6 tabs by mouth daily  for 2 days, then 5 tabs for 2 days, then 4 tabs for 2 days, then 3 tabs for 2 days, 2 tabs for 2 days, then 1 tab by mouth daily for  2 days (Patient not taking: Reported on 07/23/2015) 42 tablet 0   No current facility-administered medications for this visit.     REVIEW OF SYSTEMS: See HPI for pertinent positives and negatives.  Physical Examination Filed Vitals:   07/23/15 1002  BP: 143/86  Pulse: 85  Temp: 97.6 F (36.4 C)  TempSrc: Oral  Resp: 16  Height: 5\' 11"  (1.803 m)  Weight: 204 lb (92.534 kg)  SpO2: 95%   Body mass index is 28.46 kg/(m^2).  General: A&O x 3, WDWN. Gait: normal Eyes: PERRLA. Pulmonary: CTAB, without wheezes , rales or rhonchi. Cardiac: regular rhythm, no detected murmur.     Carotid Bruits Left Right   positive Negative  Aorta is not palpable. Radial pulses: 2+ palpable and =   VASCULAR EXAM: Extremities without ischemic changes  without Gangrene; without open wounds.     LE Pulses LEFT RIGHT   FEMORAL  palpable  palpable    POPLITEAL not palpable  not palpable   POSTERIOR TIBIAL not palpable  not palpable    DORSALIS PEDIS  ANTERIOR TIBIAL palpable  palpable    Abdomen: soft, NT, no palpable masses. Skin: no rashes, no ulcers. Musculoskeletal: no muscle wasting or atrophy. Neurologic: A&O X 3; Appropriate Affect ; SENSATION: normal; MOTOR FUNCTION: moving all extremities equally, motor strength 5/5 in arms, 4/5 in legs. Speech is fluent/normal. CN 2-12 intact.                      Non-Invasive Vascular Imaging (07/23/15):  Left Iliac artery stent Duplex: Widely patent stent with mild disease in the native artery immediate outflow.   Significant stenosis of distal aorta. Velocities markedly increased compared to prior exam. 401 cm/s in distal aorta.   ABI (Date: 07/23/2015)  R: 1.07 (0.92, 07/10/14), DP: triphasic, PT: tri, TBI: 0.69  L: 0.95 (0.82), DP: triphasic, PT: tri, TBI: 0.62   Carotid Duplex: Essentially normal with minimal plaque in the bilateral ICA's. No significant change compared to prior exam. ASSESSMENT:  ZAVIYAR VIRELLA is a 70 y.o. male who is s/p left common iliac artery stent placed in 2006. The pain he is experiencing in his hip is not due to lack oar arterial perfusion since both ABI's are normal with triphasic waveforms.  He has a left carotid bruit. He has no history of stroke or TIA.  Today's left iliac artery stent duplex suggests a widely patent stent with mild disease in the native artery immediate outflow.  Significant stenosis of distal aorta. Velocities markedly increased compared to prior exam. 401 cm/s in distal aorta.   His chief atherosclerotic risk factor is his ppd smoking, fortunately he does not have DM.  Pt states he had been followed for an aneurysm, none noted on aortoiliac duplex today, review of records does not reveal AAA or iliac artery aneurysm noted, will include right iliac with next duplex; pt declined duplex today to evaluated right iliac artery system.  PLAN:   The patient was counseled re smoking cessation and given several free resources re smoking cessation.   Based on today's exam and non-invasive vascular lab results, the patient will follow up in 1 year with the following tests: bilateral aortoiliac duplex, ABI's, and carotid duplex. I discussed in depth with the patient the nature of atherosclerosis, and emphasized the importance of maximal medical management including strict control of blood pressure, blood glucose, and lipid levels, obtaining regular exercise, and cessation of smoking.  The patient is aware that without  maximal medical management the  underlying atherosclerotic disease process will progress, limiting the benefit of any interventions. ,  The patient was given information about stroke prevention and what symptoms should prompt the patient to seek immediate medical care. . The patient was given information about PAD including signs, symptoms, treatment, what symptoms should prompt the patient to seek immediate medical care, and risk reduction measures to take. Thank you for allowing [place] to participate in this patient's care.  Clemon Chambers, RN, MSN, FNP-C [facility] Office: [phone]  Clinic MD: Early  07/23/2015 10:08 AM

## 2015-07-25 DIAGNOSIS — Z6830 Body mass index (BMI) 30.0-30.9, adult: Secondary | ICD-10-CM | POA: Diagnosis not present

## 2015-07-25 DIAGNOSIS — I10 Essential (primary) hypertension: Secondary | ICD-10-CM | POA: Diagnosis not present

## 2015-07-30 ENCOUNTER — Other Ambulatory Visit (HOSPITAL_COMMUNITY): Payer: Self-pay | Admitting: Neurosurgery

## 2015-07-30 DIAGNOSIS — M87052 Idiopathic aseptic necrosis of left femur: Principal | ICD-10-CM

## 2015-07-30 DIAGNOSIS — M87051 Idiopathic aseptic necrosis of right femur: Secondary | ICD-10-CM

## 2015-08-05 NOTE — Addendum Note (Signed)
Addended by: Reola Calkins on: 08/05/2015 04:17 PM   Modules accepted: Orders

## 2015-08-06 ENCOUNTER — Ambulatory Visit (HOSPITAL_COMMUNITY)
Admission: RE | Admit: 2015-08-06 | Discharge: 2015-08-06 | Disposition: A | Discharge status: Home / Self Care | Payer: PPO | Source: Ambulatory Visit | Attending: Neurosurgery | Admitting: Neurosurgery

## 2015-08-06 DIAGNOSIS — M87051 Idiopathic aseptic necrosis of right femur: Secondary | ICD-10-CM | POA: Insufficient documentation

## 2015-08-06 DIAGNOSIS — M879 Osteonecrosis, unspecified: Secondary | ICD-10-CM | POA: Diagnosis not present

## 2015-08-06 DIAGNOSIS — M87852 Other osteonecrosis, left femur: Secondary | ICD-10-CM | POA: Diagnosis not present

## 2015-08-06 DIAGNOSIS — M25451 Effusion, right hip: Secondary | ICD-10-CM | POA: Diagnosis not present

## 2015-08-06 DIAGNOSIS — M87052 Idiopathic aseptic necrosis of left femur: Principal | ICD-10-CM

## 2015-08-13 DIAGNOSIS — G894 Chronic pain syndrome: Secondary | ICD-10-CM | POA: Diagnosis not present

## 2015-08-13 DIAGNOSIS — E6609 Other obesity due to excess calories: Secondary | ICD-10-CM | POA: Diagnosis not present

## 2015-08-13 DIAGNOSIS — M1991 Primary osteoarthritis, unspecified site: Secondary | ICD-10-CM | POA: Diagnosis not present

## 2015-08-13 DIAGNOSIS — Z6830 Body mass index (BMI) 30.0-30.9, adult: Secondary | ICD-10-CM | POA: Diagnosis not present

## 2015-08-13 DIAGNOSIS — Z1389 Encounter for screening for other disorder: Secondary | ICD-10-CM | POA: Diagnosis not present

## 2015-08-23 DIAGNOSIS — M87051 Idiopathic aseptic necrosis of right femur: Secondary | ICD-10-CM | POA: Diagnosis not present

## 2015-08-23 DIAGNOSIS — M87052 Idiopathic aseptic necrosis of left femur: Secondary | ICD-10-CM | POA: Diagnosis not present

## 2015-09-09 ENCOUNTER — Encounter (HOSPITAL_COMMUNITY): Payer: PPO

## 2015-09-09 ENCOUNTER — Ambulatory Visit: Payer: PPO | Admitting: Family

## 2015-09-09 ENCOUNTER — Other Ambulatory Visit: Payer: Self-pay | Admitting: *Deleted

## 2015-09-09 DIAGNOSIS — I739 Peripheral vascular disease, unspecified: Secondary | ICD-10-CM

## 2015-09-13 DIAGNOSIS — M87052 Idiopathic aseptic necrosis of left femur: Secondary | ICD-10-CM | POA: Diagnosis not present

## 2015-09-13 DIAGNOSIS — M87051 Idiopathic aseptic necrosis of right femur: Secondary | ICD-10-CM | POA: Diagnosis not present

## 2015-11-07 DIAGNOSIS — M1991 Primary osteoarthritis, unspecified site: Secondary | ICD-10-CM | POA: Diagnosis not present

## 2015-11-07 DIAGNOSIS — Z6830 Body mass index (BMI) 30.0-30.9, adult: Secondary | ICD-10-CM | POA: Diagnosis not present

## 2015-11-07 DIAGNOSIS — G894 Chronic pain syndrome: Secondary | ICD-10-CM | POA: Diagnosis not present

## 2015-11-07 DIAGNOSIS — J449 Chronic obstructive pulmonary disease, unspecified: Secondary | ICD-10-CM | POA: Diagnosis not present

## 2015-11-08 NOTE — Progress Notes (Signed)
Please place orders in EPIC as patient has a Pre-op appointment with the nurse at [facility] on 11/14/2015 at 100 pm! Thank you!

## 2015-11-11 ENCOUNTER — Ambulatory Visit: Payer: Self-pay | Admitting: Orthopedic Surgery

## 2015-11-12 ENCOUNTER — Encounter (HOSPITAL_COMMUNITY): Payer: Self-pay

## 2015-11-14 ENCOUNTER — Encounter (HOSPITAL_COMMUNITY)
Admission: RE | Admit: 2015-11-14 | Discharge: 2015-11-14 | Disposition: A | Discharge status: Home / Self Care | Payer: PPO | Source: Ambulatory Visit | Attending: Orthopedic Surgery | Admitting: Orthopedic Surgery

## 2015-11-14 ENCOUNTER — Encounter (HOSPITAL_COMMUNITY): Payer: Self-pay | Admitting: *Deleted

## 2015-11-14 DIAGNOSIS — Z0183 Encounter for blood typing: Secondary | ICD-10-CM | POA: Diagnosis not present

## 2015-11-14 DIAGNOSIS — R9431 Abnormal electrocardiogram [ECG] [EKG]: Secondary | ICD-10-CM | POA: Insufficient documentation

## 2015-11-14 DIAGNOSIS — M1611 Unilateral primary osteoarthritis, right hip: Secondary | ICD-10-CM | POA: Diagnosis not present

## 2015-11-14 DIAGNOSIS — Z01818 Encounter for other preprocedural examination: Secondary | ICD-10-CM | POA: Insufficient documentation

## 2015-11-14 DIAGNOSIS — Z01812 Encounter for preprocedural laboratory examination: Secondary | ICD-10-CM | POA: Diagnosis not present

## 2015-11-14 DIAGNOSIS — I10 Essential (primary) hypertension: Secondary | ICD-10-CM | POA: Insufficient documentation

## 2015-11-14 LAB — CBC
HCT: 39.3 % (ref 39.0–52.0)
Hemoglobin: 12.9 g/dL — ABNORMAL LOW (ref 13.0–17.0)
MCH: 32.3 pg (ref 26.0–34.0)
MCHC: 32.8 g/dL (ref 30.0–36.0)
MCV: 98.5 fL (ref 78.0–100.0)
Platelets: 418 10*3/uL — ABNORMAL HIGH (ref 150–400)
RBC: 3.99 MIL/uL — ABNORMAL LOW (ref 4.22–5.81)
RDW: 16.6 % — AB (ref 11.5–15.5)
WBC: 8.5 10*3/uL (ref 4.0–10.5)

## 2015-11-14 LAB — URINALYSIS, ROUTINE W REFLEX MICROSCOPIC
BILIRUBIN URINE: NEGATIVE
Glucose, UA: NEGATIVE mg/dL
HGB URINE DIPSTICK: NEGATIVE
Ketones, ur: NEGATIVE mg/dL
Leukocytes, UA: NEGATIVE
Nitrite: NEGATIVE
PROTEIN: NEGATIVE mg/dL
SPECIFIC GRAVITY, URINE: 1.005 (ref 1.005–1.030)
pH: 6.5 (ref 5.0–8.0)

## 2015-11-14 LAB — COMPREHENSIVE METABOLIC PANEL
ALBUMIN: 3.6 g/dL (ref 3.5–5.0)
ALT: 12 U/L — ABNORMAL LOW (ref 17–63)
ANION GAP: 6 (ref 5–15)
AST: 16 U/L (ref 15–41)
Alkaline Phosphatase: 125 U/L (ref 38–126)
BUN: 8 mg/dL (ref 6–20)
CHLORIDE: 102 mmol/L (ref 101–111)
CO2: 28 mmol/L (ref 22–32)
Calcium: 9.2 mg/dL (ref 8.9–10.3)
Creatinine, Ser: 0.77 mg/dL (ref 0.61–1.24)
GFR calc Af Amer: >60 mL/min (ref >60)
GFR calc non Af Amer: >60 mL/min (ref >60)
GLUCOSE: 84 mg/dL (ref 65–99)
POTASSIUM: 4.5 mmol/L (ref 3.5–5.1)
SODIUM: 136 mmol/L (ref 135–145)
Total Bilirubin: 0.6 mg/dL (ref 0.3–1.2)
Total Protein: 7.2 g/dL (ref 6.5–8.1)

## 2015-11-14 LAB — PROTIME-INR
INR: 0.95
Prothrombin Time: 12.7 seconds (ref 11.4–15.2)

## 2015-11-14 LAB — SURGICAL PCR SCREEN
MRSA, PCR: NEGATIVE
STAPHYLOCOCCUS AUREUS: NEGATIVE

## 2015-11-14 LAB — APTT: APTT: 32 s (ref 24–36)

## 2015-11-14 NOTE — Patient Instructions (Signed)
ZACKREY SCHLICHER  11/14/2015   Your procedure is scheduled on: Wednesday 11/20/2015  Report to [facility] Main  Entrance take [place]  elevators to 3rd floor to  [place] at   1215  PM.   Call this number if you have problems the morning of surgery [phone]   Remember: ONLY 1 PERSON MAY GO WITH YOU TO SHORT STAY TO GET  READY MORNING OF [place].   Do not eat food :After Midnight. MAY HAVE CLEAR LIQUIDS FROM MIDNIGHT UP UNTIL 0915 AM THEN NOTHING UNTIL AFTER SURGERY! SEE LIST BELOW!     CLEAR LIQUID DIET   Foods Allowed                                                                     Foods Excluded  Coffee and tea, regular and decaf                             liquids that you cannot  Plain Jell-O in any flavor                                             see through such as: Fruit ices (not with fruit pulp)                                     milk, soups, orange juice  Iced Popsicles                                    All solid food Carbonated beverages, regular and diet                                    Cranberry, grape and apple juices Sports drinks like Gatorade Lightly seasoned clear broth or consume(fat free) Sugar, honey syrup  Sample Menu Breakfast                                Lunch                                     Supper Cranberry juice                    Beef broth                            Chicken broth Jell-O                                     Grape juice  Apple juice Coffee or tea                        Jell-O                                      Popsicle                                                Coffee or tea                        Coffee or tea  _____________________________________________________________________     Take these medicines the morning of surgery with A SIP OF WATER: NEURONTIN, PERCOCET IF NEEDED                                 You may not have any metal on your  body including hair pins and              piercings  Do not wear jewelry, make-up, lotions, powders or perfumes, deodorant             Do not wear nail polish.  Do not shave  48 hours prior to surgery.              Men may shave face and neck.   Do not bring valuables to the hospital. [place].  Contacts, dentures or bridgework may not be worn into surgery.  Leave suitcase in the car. After surgery it may be brought to your room.                  Please read over the following fact sheets you were given: _____________________________________________________________________             [facility] - Preparing for Surgery Before surgery, you can play an important role.  Because skin is not sterile, your skin needs to be as free of germs as possible.  You can reduce the number of germs on your skin by washing with CHG (chlorahexidine gluconate) soap before surgery.  CHG is an antiseptic cleaner which kills germs and bonds with the skin to continue killing germs even after washing. Please DO NOT use if you have an allergy to CHG or antibacterial soaps.  If your skin becomes reddened/irritated stop using the CHG and inform your nurse when you arrive at Short Stay. Do not shave (including legs and underarms) for at least 48 hours prior to the first CHG shower.  You may shave your face/neck. Please follow these instructions carefully:  1.  Shower with CHG Soap the night before surgery and the  morning of Surgery.  2.  If you choose to wash your hair, wash your hair first as usual with your  normal  shampoo.  3.  After you shampoo, rinse your hair and body thoroughly to remove the  shampoo.                           4.  Use CHG as  you would any other liquid soap.  You can apply chg directly  to the skin and wash                       Gently with a scrungie or clean washcloth.  5.  Apply the CHG Soap to your body ONLY FROM THE NECK DOWN.   Do not  use on face/ open                           Wound or open sores. Avoid contact with eyes, ears mouth and genitals (private parts).                       Wash face,  Genitals (private parts) with your normal soap.             6.  Wash thoroughly, paying special attention to the area where your surgery  will be performed.  7.  Thoroughly rinse your body with warm water from the neck down.  8.  DO NOT shower/wash with your normal soap after using and rinsing off  the CHG Soap.                9.  Pat yourself dry with a clean towel.            10.  Wear clean pajamas.            11.  Place clean sheets on your bed the night of your first shower and do not  sleep with pets. Day of Surgery : Do not apply any lotions/deodorants the morning of surgery.  Please wear clean clothes to the hospital/surgery center.  FAILURE TO FOLLOW THESE INSTRUCTIONS MAY RESULT IN THE CANCELLATION OF YOUR SURGERY PATIENT SIGNATURE_________________________________  NURSE SIGNATURE__________________________________  ________________________________________________________________________   Adam Phenix  An incentive spirometer is a tool that can help keep your lungs clear and active. This tool measures how well you are filling your lungs with each breath. Taking long deep breaths may help reverse or decrease the chance of developing breathing (pulmonary) problems (especially infection) following:  A long period of time when you are unable to move or be active. BEFORE THE PROCEDURE   If the spirometer includes an indicator to show your best effort, your nurse or respiratory therapist will set it to a desired goal.  If possible, sit up straight or lean slightly forward. Try not to slouch.  Hold the incentive spirometer in an upright position. INSTRUCTIONS FOR USE  1. Sit on the edge of your bed if possible, or sit up as far as you can in bed or on a chair. 2. Hold the incentive spirometer in an upright  position. 3. Breathe out normally. 4. Place the mouthpiece in your mouth and seal your lips tightly around it. 5. Breathe in slowly and as deeply as possible, raising the piston or the ball toward the top of the column. 6. Hold your breath for 3-5 seconds or for as long as possible. Allow the piston or ball to fall to the bottom of the column. 7. Remove the mouthpiece from your mouth and breathe out normally. 8. Rest for a few seconds and repeat Steps 1 through 7 at least 10 times every 1-2 hours when you are awake. Take your time and take a few normal breaths between deep breaths. 9. The spirometer may include an indicator to show your best effort. Use  the indicator as a goal to work toward during each repetition. 10. After each set of 10 deep breaths, practice coughing to be sure your lungs are clear. If you have an incision (the cut made at the time of surgery), support your incision when coughing by placing a pillow or rolled up towels firmly against it. Once you are able to get out of bed, walk around indoors and cough well. You may stop using the incentive spirometer when instructed by your caregiver.  RISKS AND COMPLICATIONS  Take your time so you do not get dizzy or light-headed.  If you are in pain, you may need to take or ask for pain medication before doing incentive spirometry. It is harder to take a deep breath if you are having pain. AFTER USE  Rest and breathe slowly and easily.  It can be helpful to keep track of a log of your progress. Your caregiver can provide you with a simple table to help with this. If you are using the spirometer at home, follow these instructions: [place] IF:   You are having difficultly using the spirometer.  You have trouble using the spirometer as often as instructed.  Your pain medication is not giving enough relief while using the spirometer.  You develop fever of 100.5 F (38.1 C) or higher. SEEK IMMEDIATE MEDICAL CARE IF:    You cough up bloody sputum that had not been present before.  You develop fever of 102 F (38.9 C) or greater.  You develop worsening pain at or near the incision site. MAKE SURE YOU:   Understand these instructions.  Will watch your condition.  Will get help right away if you are not doing well or get worse. Document Released: 08/17/2006 Document Revised: 06/29/2011 Document Reviewed: 10/18/2006 ExitCare Patient Information 2014 [employer], [place].   ________________________________________________________________________  WHAT IS A BLOOD TRANSFUSION? Blood Transfusion Information  A transfusion is the replacement of blood or some of its parts. Blood is made up of multiple cells which provide different functions.  Red blood cells carry oxygen and are used for blood loss replacement.  White blood cells fight against infection.  Platelets control bleeding.  Plasma helps clot blood.  Other blood products are available for specialized needs, such as hemophilia or other clotting disorders. BEFORE THE TRANSFUSION  Who gives blood for transfusions?   Healthy volunteers who are fully evaluated to make sure their blood is safe. This is blood bank blood. Transfusion therapy is the safest it has ever been in the practice of medicine. Before blood is taken from a donor, a complete history is taken to make sure that person has no history of diseases nor engages in risky social behavior (examples are intravenous drug use or sexual activity with multiple partners). The donor's travel history is screened to minimize risk of transmitting infections, such as malaria. The donated blood is tested for signs of infectious diseases, such as HIV and hepatitis. The blood is then tested to be sure it is compatible with you in order to minimize the chance of a transfusion reaction. If you or a relative donates blood, this is often done in anticipation of surgery and is not appropriate for emergency  situations. It takes many days to process the donated blood. RISKS AND COMPLICATIONS Although transfusion therapy is very safe and saves many lives, the main dangers of transfusion include:   Getting an infectious disease.  Developing a transfusion reaction. This is an allergic reaction to something in the blood  you were given. Every precaution is taken to prevent this. The decision to have a blood transfusion has been considered carefully by your caregiver before blood is given. Blood is not given unless the benefits outweigh the risks. AFTER THE TRANSFUSION  Right after receiving a blood transfusion, you will usually feel much better and more energetic. This is especially true if your red blood cells have gotten low (anemic). The transfusion raises the level of the red blood cells which carry oxygen, and this usually causes an energy increase.  The nurse administering the transfusion will monitor you carefully for complications. HOME CARE INSTRUCTIONS  No special instructions are needed after a transfusion. You may find your energy is better. Speak with your caregiver about any limitations on activity for underlying diseases you may have. SEEK MEDICAL CARE IF:   Your condition is not improving after your transfusion.  You develop redness or irritation at the intravenous (IV) site. SEEK IMMEDIATE MEDICAL CARE IF:  Any of the following symptoms occur over the next 12 hours:  Shaking chills.  You have a temperature by mouth above 102 F (38.9 C), not controlled by medicine.  Chest, back, or muscle pain.  People around you feel you are not acting correctly or are confused.  Shortness of breath or difficulty breathing.  Dizziness and fainting.  You get a rash or develop hives.  You have a decrease in urine output.  Your urine turns a dark color or changes to pink, red, or brown. Any of the following symptoms occur over the next 10 days:  You have a temperature by mouth above  102 F (38.9 C), not controlled by medicine.  Shortness of breath.  Weakness after normal activity.  The white part of the eye turns yellow (jaundice).  You have a decrease in the amount of urine or are urinating less often.  Your urine turns a dark color or changes to pink, red, or brown. Document Released: 04/03/2000 Document Revised: 06/29/2011 Document Reviewed: 11/21/2007 [facility] Patient Information 2014 [place], [place].  _______________________________________________________________________

## 2015-11-14 NOTE — Progress Notes (Signed)
09/09/2015-pre-operative clearance from Collene Mares, PA on chart. 11/07/2015-Pre-operative clearance from Dr. Sharilyn Sites on chart.

## 2015-11-15 LAB — ABO/RH: ABO/RH(D): O POS

## 2015-11-17 ENCOUNTER — Ambulatory Visit: Payer: Self-pay | Admitting: Orthopedic Surgery

## 2015-11-17 NOTE — H&P (Signed)
[name] DOB: 12-05-1945 Married / Language: English / Race: White Male Date of Admission:  11/20/2015 CC:  Right and Left Hip Pain History of Present Illness The patient is a 70 year old male who comes in for a preoperative History and Physical. The patient is scheduled for a right total hip arthroplasty (anterior) to be performed by Dr. Dione Plover. Aluisio, MD at [facility] on 11/20/2015. The patient is a 70 year old male who presented with a hip problem. The patient reports left hip and right hip problems including pain symptoms that have been present for year(s). The symptoms began without any known injury. The patient reports symptoms radiating to the: right groin. The patient describes the hip problem as sharp, dull and aching. Onset of symptoms was gradual.The symptoms are described as moderate in severity.The patient feels as if their symptoms are does feel they are worsening (loosing strength). Symptoms are exacerbated by movement ("pain all the time"). Symptoms are relieved by opioid analgesics. Prior to being seen, the patient was previously evaluated in this clinic earlier. Previous workup for this problem has included hip x-rays (right hip) and hip MRI (BIL). He has been diagnosed with osteonecrosis of both hips, right worse than left. He has severe pain in the right hip. It has bothering him at all times. It is limiting what he can and cannot do. He is at a point where he would like to get the hips fixed. He will proceed with the right hip first. They have been treated conservatively in the past for the above stated problem and despite conservative measures, they continue to have progressive pain and severe functional limitations and dysfunction. They have failed non-operative management including home exercise, medications. It is felt that they would benefit from undergoing total joint replacement. Risks and benefits of the procedure have been discussed with the patient and they  elect to proceed with surgery. There are no active contraindications to surgery such as ongoing infection or rapidly progressive neurological disease.  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 father Hypertension  Mother.  Social History  Current work status  disabled Exercise  Exercises daily; does running / walking Former drinker  08/23/2015: In the past drank beer Living situation  live with spouse Marital status  married No history of drug/alcohol rehab  Tobacco / smoke exposure  08/23/2015: yes Tobacco use  Current every day smoker. 08/23/2015: smoke(d) 1 1/2 pack(s) per day Children  2  Medication History Oxycodone-Acetaminophen (10-325MG  Tablet, Oral) Active. Gabapentin (600MG  Tablet, Oral) Active. Valium (Oral) Specific strength unknown - Active. Lisinopril (20MG  Tablet, Oral) Active. Flexeril (10MG  Tablet, Oral) Active. Furosemide (20MG  Tablet, Oral) Active.  Past Surgical History Colon Polyp Removal - Colonoscopy  Foot Surgery  right Neck Disc Surgery  Rotator Cuff Repair  right Sinus Surgery  Spinal Fusion  neck and lower back Spinal Surgery  Vasectomy  2 iliac stent    Review of Systems  General Not Present- Chills, Fatigue, Fever, Memory Loss, Night Sweats, Weight Gain and Weight Loss. Skin Not Present- Eczema, Hives, Itching, Lesions and Rash. HEENT Not Present- Dentures, Double Vision, Headache, Hearing Loss, Tinnitus and Visual Loss. Respiratory Not Present- Allergies, Chronic Cough, Coughing up blood, Shortness of breath at rest and Shortness  of breath with exertion. Cardiovascular Not Present- Chest Pain, Difficulty Breathing Lying Down, Murmur, Palpitations,  Racing/skipping heartbeats and Swelling. Gastrointestinal Not Present- Abdominal Pain, Bloody Stool, Constipation, Diarrhea, Difficulty Swallowing, Heartburn, Jaundice, Loss of appetitie, Nausea and Vomiting. Male Genitourinary Not Present- Blood in Urine, Discharge, Flank Pain, Incontinence, Painful Urination, Urgency, Urinary frequency, Urinary Retention, Urinating at Night and Weak urinary stream. Musculoskeletal Present- Back Pain, Joint Pain and Muscle Weakness. Not Present- Joint Swelling, Morning Stiffness, Muscle Pain and Spasms. Neurological Not Present- Blackout spells, Difficulty with balance, Dizziness, Paralysis, Tremor and Weakness. Psychiatric Not Present- Insomnia.  Vitals  Weight: 206 lb Height: 69in Weight was reported by patient. Height was reported by patient. Body Surface Area: 2.09 m Body Mass Index: 30.42 kg/m  Pulse: 96 (Regular)  BP: 144/78 (Sitting, Left Arm, Standard)  Physical Exam General Mental Status -Alert, cooperative and good historian. General Appearance-pleasant, Not in acute distress. Orientation-Oriented X3. Build & Nutrition-Well nourished and Well developed.  Head and Neck Head-normocephalic, atraumatic . Neck Global Assessment - supple, no bruit auscultated on the right, no bruit auscultated on the left.  Eye Vision-Wears corrective lenses. Pupil - Bilateral-Regular and Round. Motion - Bilateral-EOMI.  ENMT Note: lower denture plate   Chest and Lung Exam Auscultation Breath sounds - clear at anterior chest wall and clear at posterior chest wall. Adventitious sounds - No Adventitious sounds.  Cardiovascular Auscultation Rhythm - Regular rate and rhythm. Heart Sounds - S1 WNL and S2 WNL. Murmurs & Other Heart Sounds - Auscultation of the heart reveals - No Murmurs.  Abdomen Palpation/Percussion Tenderness - Abdomen is non-tender to palpation. Rigidity (guarding) - Abdomen is  soft. Auscultation Auscultation of the abdomen reveals - Bowel sounds normal.  Male Genitourinary Note: Not done, not pertinent to present illness   Musculoskeletal Note: On exam, well-developed male, in no distress. Evaluation of his right hip, flexion to about 10 internal rotation, 20 external rotation, 20 abduction. Left hip flexion 120, rotation in 30, out 40, abduct 40 without discomfort.  RADIOGRAPHS I reviewed his plain radiographs. He has an area of high-grade osteonecrosis with subchondral sclerosis and collapse of his right femoral head. He does not have any severe secondary degenerative changes at this point. MRI shows involvement of almost the entire femoral head of the osteonecrotic process. He also has osteonecrosis of the left femoral head on the MRI involving about 30 to 40% without any collapse.  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  Topical TXA - PVD, Iliac Stent  Anesthesia Issues: None  Signed electronically by Ok Edwards, III PA-C

## 2015-11-18 NOTE — Progress Notes (Signed)
Patient and wife notified of time change for surgery on Wednesday 11/20/2015 and to arrive at Short Stay at [facility] at 130 pm for surgery at 1630. Patient and wife instructed that patient can have clear liquids from midnight up until 1030 the morning of surgery and nothing after 1030 until after surgery. Patient and wife verbalized understanding.

## 2015-11-20 ENCOUNTER — Encounter (HOSPITAL_COMMUNITY): Payer: Self-pay

## 2015-11-20 ENCOUNTER — Inpatient Hospital Stay (HOSPITAL_COMMUNITY): Payer: PPO

## 2015-11-20 ENCOUNTER — Inpatient Hospital Stay (HOSPITAL_COMMUNITY): Payer: PPO | Admitting: Anesthesiology

## 2015-11-20 ENCOUNTER — Encounter (HOSPITAL_COMMUNITY)
Admission: RE | Disposition: A | Discharge status: Home Health | Payer: Self-pay | Source: Ambulatory Visit | Attending: Orthopedic Surgery

## 2015-11-20 ENCOUNTER — Inpatient Hospital Stay (HOSPITAL_COMMUNITY)
Admission: RE | Admit: 2015-11-20 | Discharge: 2015-11-22 | DRG: 470 | Disposition: A | Discharge status: Home Health | Payer: PPO | Source: Ambulatory Visit | Attending: Orthopedic Surgery | Admitting: Orthopedic Surgery

## 2015-11-20 DIAGNOSIS — M1611 Unilateral primary osteoarthritis, right hip: Secondary | ICD-10-CM | POA: Diagnosis present

## 2015-11-20 DIAGNOSIS — M25551 Pain in right hip: Secondary | ICD-10-CM

## 2015-11-20 DIAGNOSIS — M87051 Idiopathic aseptic necrosis of right femur: Secondary | ICD-10-CM | POA: Diagnosis not present

## 2015-11-20 DIAGNOSIS — J449 Chronic obstructive pulmonary disease, unspecified: Secondary | ICD-10-CM | POA: Diagnosis present

## 2015-11-20 DIAGNOSIS — F17200 Nicotine dependence, unspecified, uncomplicated: Secondary | ICD-10-CM | POA: Diagnosis present

## 2015-11-20 DIAGNOSIS — Z471 Aftercare following joint replacement surgery: Secondary | ICD-10-CM | POA: Diagnosis not present

## 2015-11-20 DIAGNOSIS — I739 Peripheral vascular disease, unspecified: Secondary | ICD-10-CM | POA: Diagnosis present

## 2015-11-20 DIAGNOSIS — I10 Essential (primary) hypertension: Secondary | ICD-10-CM | POA: Diagnosis not present

## 2015-11-20 DIAGNOSIS — Z96641 Presence of right artificial hip joint: Secondary | ICD-10-CM | POA: Diagnosis not present

## 2015-11-20 DIAGNOSIS — M169 Osteoarthritis of hip, unspecified: Secondary | ICD-10-CM | POA: Diagnosis present

## 2015-11-20 DIAGNOSIS — Z96649 Presence of unspecified artificial hip joint: Secondary | ICD-10-CM

## 2015-11-20 HISTORY — PX: TOTAL HIP ARTHROPLASTY: SHX124

## 2015-11-20 LAB — TYPE AND SCREEN
ABO/RH(D): O POS
ANTIBODY SCREEN: NEGATIVE

## 2015-11-20 SURGERY — ARTHROPLASTY, HIP, TOTAL, ANTERIOR APPROACH
Anesthesia: General | Site: Hip | Laterality: Right

## 2015-11-20 MED ORDER — FENTANYL CITRATE (PF) 100 MCG/2ML IJ SOLN
INTRAMUSCULAR | Status: DC | PRN
  Administered 2015-11-20: 100 ug via INTRAVENOUS
  Administered 2015-11-20: 50 ug via INTRAVENOUS
  Administered 2015-11-20: 100 ug via INTRAVENOUS

## 2015-11-20 MED ORDER — MORPHINE SULFATE (PF) 2 MG/ML IV SOLN: 1.0000 mg | INTRAVENOUS | Status: DC | PRN

## 2015-11-20 MED ORDER — DEXAMETHASONE SODIUM PHOSPHATE 10 MG/ML IJ SOLN
10.0000 mg | Freq: Once | INTRAMUSCULAR | Status: AC
  Administered 2015-11-21: 10 mg via INTRAVENOUS
  Filled 2015-11-20: qty 1

## 2015-11-20 MED ORDER — ACETAMINOPHEN 650 MG RE SUPP: 650.0000 mg | Freq: Four times a day (QID) | RECTAL | Status: DC | PRN

## 2015-11-20 MED ORDER — DIPHENHYDRAMINE HCL 12.5 MG/5ML PO ELIX: 12.5000 mg | ORAL_SOLUTION | ORAL | Status: DC | PRN

## 2015-11-20 MED ORDER — ONDANSETRON HCL 4 MG/2ML IJ SOLN
INTRAMUSCULAR | Status: DC | PRN
  Administered 2015-11-20: 4 mg via INTRAVENOUS

## 2015-11-20 MED ORDER — HYDROMORPHONE HCL 1 MG/ML IJ SOLN
INTRAMUSCULAR | Status: AC
  Filled 2015-11-20: qty 1

## 2015-11-20 MED ORDER — METHOCARBAMOL 1000 MG/10ML IJ SOLN
500.0000 mg | Freq: Four times a day (QID) | INTRAVENOUS | Status: DC | PRN
  Administered 2015-11-20: 500 mg via INTRAVENOUS
  Filled 2015-11-20: qty 550
  Filled 2015-11-20: qty 5

## 2015-11-20 MED ORDER — DIAZEPAM 5 MG PO TABS: 10.0000 mg | ORAL_TABLET | Freq: Four times a day (QID) | ORAL | Status: DC | PRN

## 2015-11-20 MED ORDER — BISACODYL 10 MG RE SUPP: 10.0000 mg | Freq: Every day | RECTAL | Status: DC | PRN

## 2015-11-20 MED ORDER — TRAMADOL HCL 50 MG PO TABS: 50.0000 mg | ORAL_TABLET | Freq: Four times a day (QID) | ORAL | Status: DC | PRN

## 2015-11-20 MED ORDER — PHENYLEPHRINE 40 MCG/ML (10ML) SYRINGE FOR IV PUSH (FOR BLOOD PRESSURE SUPPORT)
PREFILLED_SYRINGE | INTRAVENOUS | Status: AC
  Filled 2015-11-20: qty 30

## 2015-11-20 MED ORDER — HYDROMORPHONE HCL 1 MG/ML IJ SOLN
0.2500 mg | INTRAMUSCULAR | Status: DC | PRN
  Administered 2015-11-20 (×4): 0.5 mg via INTRAVENOUS

## 2015-11-20 MED ORDER — MIDAZOLAM HCL 2 MG/2ML IJ SOLN
INTRAMUSCULAR | Status: AC
  Filled 2015-11-20: qty 2

## 2015-11-20 MED ORDER — ROCURONIUM BROMIDE 100 MG/10ML IV SOLN
INTRAVENOUS | Status: DC | PRN
  Administered 2015-11-20: 40 mg via INTRAVENOUS
  Administered 2015-11-20 (×2): 10 mg via INTRAVENOUS

## 2015-11-20 MED ORDER — ONDANSETRON HCL 4 MG/2ML IJ SOLN
INTRAMUSCULAR | Status: AC
  Filled 2015-11-20: qty 2

## 2015-11-20 MED ORDER — OXYCODONE HCL 5 MG PO TABS
10.0000 mg | ORAL_TABLET | ORAL | Status: DC | PRN
  Administered 2015-11-20 – 2015-11-21 (×3): 10 mg via ORAL
  Filled 2015-11-20 (×3): qty 2

## 2015-11-20 MED ORDER — 0.9 % SODIUM CHLORIDE (POUR BTL) OPTIME
TOPICAL | Status: DC | PRN
  Administered 2015-11-20: 1000 mL

## 2015-11-20 MED ORDER — SODIUM CHLORIDE 0.9 % IJ SOLN
INTRAMUSCULAR | Status: AC
  Filled 2015-11-20: qty 50

## 2015-11-20 MED ORDER — DOCUSATE SODIUM 100 MG PO CAPS
100.0000 mg | ORAL_CAPSULE | Freq: Two times a day (BID) | ORAL | Status: DC
  Administered 2015-11-21 – 2015-11-22 (×3): 100 mg via ORAL
  Filled 2015-11-20 (×3): qty 1

## 2015-11-20 MED ORDER — SUCCINYLCHOLINE CHLORIDE 20 MG/ML IJ SOLN
INTRAMUSCULAR | Status: DC | PRN
  Administered 2015-11-20: 100 mg via INTRAVENOUS

## 2015-11-20 MED ORDER — PHENOL 1.4 % MT LIQD: 1.0000 | OROMUCOSAL | Status: DC | PRN

## 2015-11-20 MED ORDER — RIVAROXABAN 10 MG PO TABS
10.0000 mg | ORAL_TABLET | Freq: Every day | ORAL | Status: DC
  Administered 2015-11-21 – 2015-11-22 (×2): 10 mg via ORAL
  Filled 2015-11-20 (×2): qty 1

## 2015-11-20 MED ORDER — ONDANSETRON HCL 4 MG/2ML IJ SOLN: 4.0000 mg | Freq: Four times a day (QID) | INTRAMUSCULAR | Status: DC | PRN

## 2015-11-20 MED ORDER — LIDOCAINE HCL (CARDIAC) 20 MG/ML IV SOLN
INTRAVENOUS | Status: DC | PRN
  Administered 2015-11-20: 50 mg via INTRAVENOUS

## 2015-11-20 MED ORDER — TRANEXAMIC ACID 1000 MG/10ML IV SOLN
2000.0000 mg | Freq: Once | INTRAVENOUS | Status: DC
  Filled 2015-11-20: qty 20

## 2015-11-20 MED ORDER — CEFAZOLIN SODIUM-DEXTROSE 2-4 GM/100ML-% IV SOLN
2.0000 g | INTRAVENOUS | Status: AC
  Administered 2015-11-20: 2 g via INTRAVENOUS
  Filled 2015-11-20: qty 100

## 2015-11-20 MED ORDER — LACTULOSE 10 GM/15ML PO SOLN
10.0000 g | Freq: Every day | ORAL | Status: DC
  Filled 2015-11-20: qty 15

## 2015-11-20 MED ORDER — CEFAZOLIN SODIUM-DEXTROSE 2-4 GM/100ML-% IV SOLN
INTRAVENOUS | Status: AC
  Filled 2015-11-20: qty 100

## 2015-11-20 MED ORDER — ONDANSETRON HCL 4 MG PO TABS: 4.0000 mg | ORAL_TABLET | Freq: Four times a day (QID) | ORAL | Status: DC | PRN

## 2015-11-20 MED ORDER — DEXAMETHASONE SODIUM PHOSPHATE 10 MG/ML IJ SOLN
10.0000 mg | Freq: Once | INTRAMUSCULAR | Status: AC
  Administered 2015-11-20: 10 mg via INTRAVENOUS

## 2015-11-20 MED ORDER — TRANEXAMIC ACID 1000 MG/10ML IV SOLN
1000.0000 mg | Freq: Once | INTRAVENOUS | Status: AC
  Administered 2015-11-20: 1000 mg via INTRAVENOUS
  Filled 2015-11-20: qty 10

## 2015-11-20 MED ORDER — CEFAZOLIN SODIUM-DEXTROSE 2-4 GM/100ML-% IV SOLN
2.0000 g | Freq: Four times a day (QID) | INTRAVENOUS | Status: AC
  Administered 2015-11-20 – 2015-11-21 (×2): 2 g via INTRAVENOUS
  Filled 2015-11-20 (×2): qty 100

## 2015-11-20 MED ORDER — MENTHOL 3 MG MT LOZG: 1.0000 | LOZENGE | OROMUCOSAL | Status: DC | PRN

## 2015-11-20 MED ORDER — LACTATED RINGERS IV SOLN
INTRAVENOUS | Status: DC
  Administered 2015-11-20 (×4): via INTRAVENOUS

## 2015-11-20 MED ORDER — SUGAMMADEX SODIUM 200 MG/2ML IV SOLN
INTRAVENOUS | Status: DC | PRN
  Administered 2015-11-20: 200 mg via INTRAVENOUS

## 2015-11-20 MED ORDER — STERILE WATER FOR IRRIGATION IR SOLN
Status: DC | PRN
  Administered 2015-11-20: 2000 mL

## 2015-11-20 MED ORDER — FUROSEMIDE 20 MG PO TABS: 20.0000 mg | ORAL_TABLET | Freq: Every day | ORAL | Status: DC | PRN

## 2015-11-20 MED ORDER — POLYETHYLENE GLYCOL 3350 17 G PO PACK: 17.0000 g | PACK | Freq: Every day | ORAL | Status: DC | PRN

## 2015-11-20 MED ORDER — PROPOFOL 10 MG/ML IV BOLUS
INTRAVENOUS | Status: DC | PRN
  Administered 2015-11-20: 160 mg via INTRAVENOUS

## 2015-11-20 MED ORDER — SODIUM CHLORIDE 0.9 % IV SOLN
INTRAVENOUS | Status: DC
  Administered 2015-11-20: 23:00:00 via INTRAVENOUS

## 2015-11-20 MED ORDER — METOCLOPRAMIDE HCL 5 MG PO TABS: 5.0000 mg | ORAL_TABLET | Freq: Three times a day (TID) | ORAL | Status: DC | PRN

## 2015-11-20 MED ORDER — METHOCARBAMOL 500 MG PO TABS
500.0000 mg | ORAL_TABLET | Freq: Four times a day (QID) | ORAL | Status: DC | PRN
  Administered 2015-11-21: 500 mg via ORAL
  Filled 2015-11-20: qty 1

## 2015-11-20 MED ORDER — ACETAMINOPHEN 10 MG/ML IV SOLN
INTRAVENOUS | Status: AC
  Filled 2015-11-20: qty 100

## 2015-11-20 MED ORDER — PHENYLEPHRINE HCL 10 MG/ML IJ SOLN
INTRAMUSCULAR | Status: DC | PRN
  Administered 2015-11-20 (×3): 120 ug via INTRAVENOUS
  Administered 2015-11-20 (×2): 80 ug via INTRAVENOUS
  Administered 2015-11-20: 120 ug via INTRAVENOUS
  Administered 2015-11-20: 80 ug via INTRAVENOUS
  Administered 2015-11-20: 120 ug via INTRAVENOUS
  Administered 2015-11-20: 40 ug via INTRAVENOUS

## 2015-11-20 MED ORDER — ACETAMINOPHEN 10 MG/ML IV SOLN
1000.0000 mg | Freq: Once | INTRAVENOUS | Status: AC
  Administered 2015-11-20: 1000 mg via INTRAVENOUS
  Filled 2015-11-20: qty 100

## 2015-11-20 MED ORDER — MIDAZOLAM HCL 5 MG/5ML IJ SOLN
INTRAMUSCULAR | Status: DC | PRN
  Administered 2015-11-20: 2 mg via INTRAVENOUS

## 2015-11-20 MED ORDER — LACTATED RINGERS IV SOLN: INTRAVENOUS | Status: DC

## 2015-11-20 MED ORDER — METOCLOPRAMIDE HCL 5 MG/ML IJ SOLN: 5.0000 mg | Freq: Three times a day (TID) | INTRAMUSCULAR | Status: DC | PRN

## 2015-11-20 MED ORDER — LIDOCAINE HCL (CARDIAC) 20 MG/ML IV SOLN
INTRAVENOUS | Status: AC
  Filled 2015-11-20: qty 5

## 2015-11-20 MED ORDER — GABAPENTIN 300 MG PO CAPS
600.0000 mg | ORAL_CAPSULE | Freq: Three times a day (TID) | ORAL | Status: DC
  Administered 2015-11-21 – 2015-11-22 (×3): 600 mg via ORAL
  Filled 2015-11-20 (×4): qty 2

## 2015-11-20 MED ORDER — CHLORHEXIDINE GLUCONATE 4 % EX LIQD: 60.0000 mL | Freq: Once | CUTANEOUS | Status: DC

## 2015-11-20 MED ORDER — BUPIVACAINE HCL (PF) 0.25 % IJ SOLN
INTRAMUSCULAR | Status: DC | PRN
  Administered 2015-11-20: 30 mL

## 2015-11-20 MED ORDER — SODIUM CHLORIDE 0.9 % IR SOLN
Status: DC | PRN
  Administered 2015-11-20: 3000 mL

## 2015-11-20 MED ORDER — ACETAMINOPHEN 325 MG PO TABS
650.0000 mg | ORAL_TABLET | Freq: Four times a day (QID) | ORAL | Status: DC | PRN
  Administered 2015-11-22: 650 mg via ORAL
  Filled 2015-11-20: qty 2

## 2015-11-20 MED ORDER — FENTANYL CITRATE (PF) 250 MCG/5ML IJ SOLN
INTRAMUSCULAR | Status: AC
  Filled 2015-11-20: qty 5

## 2015-11-20 MED ORDER — PROPOFOL 10 MG/ML IV BOLUS
INTRAVENOUS | Status: AC
  Filled 2015-11-20: qty 20

## 2015-11-20 MED ORDER — ACETAMINOPHEN 500 MG PO TABS
1000.0000 mg | ORAL_TABLET | Freq: Four times a day (QID) | ORAL | Status: AC
  Administered 2015-11-20 – 2015-11-21 (×3): 1000 mg via ORAL
  Filled 2015-11-20 (×4): qty 2

## 2015-11-20 MED ORDER — BUPIVACAINE HCL (PF) 0.25 % IJ SOLN
INTRAMUSCULAR | Status: AC
  Filled 2015-11-20: qty 30

## 2015-11-20 MED ORDER — HYDROMORPHONE HCL 1 MG/ML IJ SOLN
INTRAMUSCULAR | Status: AC
  Administered 2015-11-20: 0.5 mg via INTRAVENOUS
  Filled 2015-11-20: qty 1

## 2015-11-20 MED ORDER — FLEET ENEMA 7-19 GM/118ML RE ENEM: 1.0000 | ENEMA | Freq: Once | RECTAL | Status: DC | PRN

## 2015-11-20 SURGICAL SUPPLY — 39 items
BAG DECANTER FOR FLEXI CONT (MISCELLANEOUS) ×3 IMPLANT
BAG SPEC THK2 15X12 ZIP CLS (MISCELLANEOUS)
BAG ZIPLOCK 12X15 (MISCELLANEOUS) IMPLANT
BLADE SAG 18X100X1.27 (BLADE) ×3 IMPLANT
CAPT HIP TOTAL 2 ×2 IMPLANT
CLOSURE WOUND 1/2 X4 (GAUZE/BANDAGES/DRESSINGS) ×2
CLOTH BEACON ORANGE TIMEOUT ST (SAFETY) ×3 IMPLANT
COVER PERINEAL POST (MISCELLANEOUS) ×3 IMPLANT
DECANTER SPIKE VIAL GLASS SM (MISCELLANEOUS) ×1 IMPLANT
DRAPE STERI IOBAN 125X83 (DRAPES) ×3 IMPLANT
DRAPE U-SHAPE 47X51 STRL (DRAPES) ×6 IMPLANT
DRSG ADAPTIC 3X8 NADH LF (GAUZE/BANDAGES/DRESSINGS) ×3 IMPLANT
DRSG MEPILEX BORDER 4X4 (GAUZE/BANDAGES/DRESSINGS) ×3 IMPLANT
DRSG MEPILEX BORDER 4X8 (GAUZE/BANDAGES/DRESSINGS) ×3 IMPLANT
DURAPREP 26ML APPLICATOR (WOUND CARE) ×3 IMPLANT
ELECT REM PT RETURN 9FT ADLT (ELECTROSURGICAL) ×3
ELECTRODE REM PT RTRN 9FT ADLT (ELECTROSURGICAL) ×1 IMPLANT
EVACUATOR 1/8 PVC DRAIN (DRAIN) ×3 IMPLANT
GLOVE BIO SURGEON STRL SZ7.5 (GLOVE) ×3 IMPLANT
GLOVE BIO SURGEON STRL SZ8 (GLOVE) ×4 IMPLANT
GLOVE BIOGEL PI IND STRL 8 (GLOVE) ×2 IMPLANT
GLOVE BIOGEL PI INDICATOR 8 (GLOVE) ×4
GOWN STRL REUS W/TWL LRG LVL3 (GOWN DISPOSABLE) ×3 IMPLANT
GOWN STRL REUS W/TWL XL LVL3 (GOWN DISPOSABLE) ×3 IMPLANT
HANDPIECE INTERPULSE COAX TIP (DISPOSABLE) ×3
PACK ANTERIOR HIP CUSTOM (KITS) ×3 IMPLANT
SET HNDPC FAN SPRY TIP SCT (DISPOSABLE) IMPLANT
STRIP CLOSURE SKIN 1/2X4 (GAUZE/BANDAGES/DRESSINGS) ×3 IMPLANT
SUT ETHIBOND NAB CT1 #1 30IN (SUTURE) ×3 IMPLANT
SUT MNCRL AB 4-0 PS2 18 (SUTURE) ×3 IMPLANT
SUT VIC AB 2-0 CT1 27 (SUTURE) ×6
SUT VIC AB 2-0 CT1 TAPERPNT 27 (SUTURE) ×2 IMPLANT
SUT VLOC 180 0 24IN GS25 (SUTURE) ×3 IMPLANT
SWAB COLLECTION DEVICE MRSA (MISCELLANEOUS) ×2 IMPLANT
SWAB CULTURE ESWAB REG 1ML (MISCELLANEOUS) ×2 IMPLANT
SYR 50ML LL SCALE MARK (SYRINGE) ×2 IMPLANT
TRAY FOLEY W/METER SILVER 14FR (SET/KITS/TRAYS/PACK) ×1 IMPLANT
TRAY FOLEY W/METER SILVER 16FR (SET/KITS/TRAYS/PACK) ×3 IMPLANT
YANKAUER SUCT BULB TIP 10FT TU (MISCELLANEOUS) ×3 IMPLANT

## 2015-11-20 NOTE — Op Note (Signed)
OPERATIVE REPORT  PREOPERATIVE DIAGNOSIS: Osteonecrosis of the Right hip.   POSTOPERATIVE DIAGNOSIS: Osteonecrosis of the Right  hip.   PROCEDURE: Right total hip arthroplasty, anterior approach.   SURGEON: Gaynelle Arabian, MD   ASSISTANT: Arlee Muslim, PA-C  ANESTHESIA:  General  ESTIMATED BLOOD LOSS:-600 ml   DRAINS: Hemovac x1.   COMPLICATIONS: None   CONDITION: PACU - hemodynamically stable.   BRIEF CLINICAL NOTE: Derek [name] is a 70 y.o. male who has advanced end-  stage osteonecrosis of their Right  hip with progressively worsening pain and  dysfunction.The patient has failed nonoperative management and presents for  total hip arthroplasty.   PROCEDURE IN DETAIL: After successful administration of spinal  anesthetic, the traction boots for the [facility] bed were placed on both  feet and the patient was placed onto the [facility] bed, boots placed into the leg  holders. The Right hip was then isolated from the perineum with plastic  drapes and prepped and draped in the usual sterile fashion. ASIS and  greater trochanter were marked and a oblique incision was made, starting  at about 1 cm lateral and 2 cm distal to the ASIS and coursing towards  the anterior cortex of the femur. The skin was cut with a 10 blade  through subcutaneous tissue to the level of the fascia overlying the  tensor fascia lata muscle. The fascia was then incised in line with the  incision at the junction of the anterior third and posterior 2/3rd. The  muscle was teased off the fascia and then the interval between the TFL  and the rectus was developed. The Hohmann retractor was then placed at  the top of the femoral neck over the capsule. The vessels overlying the  capsule were cauterized and the fat on top of the capsule was removed.  A Hohmann retractor was then placed anterior underneath the rectus  femoris to give exposure to the entire anterior capsule. A T-shaped  capsulotomy was  performed. A large amount of cloudy fluid was encountered upon entering the joint and was sent for stat gram stain and culture and sensitivity. The femoral head  was identified and essentially collapsed and eroded off the femoral neck. The gram stain showed rare WBCs and no organisms so we proceeded with the planned hip replacement.       Osteophytes are removed off the superior acetabulum.  The femoral neck was then cut in situ with an oscillating saw. Traction  was then applied to the left lower extremity utilizing the [facility]  traction. The femoral head was then removed. Retractors were placed  around the acetabulum and then circumferential removal of the labrum was  performed. Osteophytes were also removed. Reaming starts at 45 mm to  medialize and  Increased in 2 mm increments to 51 mm. We reamed in  approximately 40 degrees of abduction, 20 degrees anteversion. A 52 mm  pinnacle acetabular shell was then impacted in anatomic position under  fluoroscopic guidance with excellent purchase. I placed 2 additional dome screws given The weakened quality of the bone. A 32 mm neutral + 4 marathon liner was then  placed into the acetabular shell.       The femoral lift was then placed along the lateral aspect of the femur  just distal to the vastus ridge. The leg was  externally rotated and capsule  was stripped off the inferior aspect of the femoral neck down to the  level  of the lesser trochanter, this was done with electrocautery. The femur was lifted after this was performed. The  leg was then placed in an extended and adducted position essentially delivering the femur. We also removed the capsule superiorly and the piriformis from the piriformis fossa to gain excellent exposure of the  proximal femur. Rongeur was used to remove some cancellous bone to get  into the lateral portion of the proximal femur for placement of the  initial starter reamer. The starter broaches was placed  the starter  broach  and was shown to go down the center of the canal. Broaching  with the  Corail system was then performed starting at size 8, coursing  Up to size 15. A size 15 had excellent torsional and rotational  and axial stability. The trial high offset neck was then placed  with a 36 + 13 trial head. (I initially trialed the +5 head but needed to go up to the + 13 to get appropriate soft tissue tension and equal leg lengths) The hip was then reduced. We confirmed that  the stem was in the canal both on AP and lateral x-rays. It also has excellent sizing. The hip was reduced with outstanding stability through full extension and full external rotation.. AP pelvis was taken and the leg lengths were measured and found to be equal. Hip was then dislocated again and the femoral head and neck removed. The  femoral broach was removed. Size 15 Corail stem with a high offset  neck was then impacted into the femur following native anteversion. Has  excellent purchase in the canal. Excellent torsional and rotational and  axial stability. It is confirmed to be in the canal on AP and lateral  fluoroscopic views. The 32 + 13 metal head was placed and the hip  reduced with outstanding stability. Again AP pelvis was taken and it  confirmed that the leg lengths were equal. The wound was then copiously  irrigated with saline solution and the capsule reattached and repaired  with Ethibond suture. 30 ml of .25% Bupivicaine was  injected into the capsule and into the edge of the tensor fascia lata as well as subcutaneous tissue. The fascia overlying the tensor fascia lata was then closed with a running #1 V-Loc. Subcu was closed with interrupted 2-0 Vicryl and subcuticular running 4-0 Monocryl. Incision was cleaned  and dried. Steri-Strips and a bulky sterile dressing applied. Hemovac  drain was hooked to suction and then the patient was awakened and transported to  recovery in stable condition.        Please note that a  surgical assistant was a medical necessity for this procedure to perform it in a safe and expeditious manner. Assistant was necessary to provide appropriate retraction of vital neurovascular structures and to prevent femoral fracture and allow for anatomic placement of the prosthesis.  Gaynelle Arabian, M.D.

## 2015-11-20 NOTE — H&P (View-Only) (Signed)
Derek [name] DOB: 1946/03/04 Married / Language: English / Race: White Male Date of Admission:  11/20/2015 CC:  Right and Left Hip Pain History of Present Illness The patient is a 70 year old male who comes in for a preoperative History and Physical. The patient is scheduled for a right total hip arthroplasty (anterior) to be performed by Dr. Dione Plover. Aluisio, MD at [facility] on 11/20/2015. The patient is a 70 year old male who presented with a hip problem. The patient reports left hip and right hip problems including pain symptoms that have been present for year(s). The symptoms began without any known injury. The patient reports symptoms radiating to the: right groin. The patient describes the hip problem as sharp, dull and aching. Onset of symptoms was gradual.The symptoms are described as moderate in severity.The patient feels as if their symptoms are does feel they are worsening (loosing strength). Symptoms are exacerbated by movement ("pain all the time"). Symptoms are relieved by opioid analgesics. Prior to being seen, the patient was previously evaluated in this clinic earlier. Previous workup for this problem has included hip x-rays (right hip) and hip MRI (BIL). He has been diagnosed with osteonecrosis of both hips, right worse than left. He has severe pain in the right hip. It has bothering him at all times. It is limiting what he can and cannot do. He is at a point where he would like to get the hips fixed. He will proceed with the right hip first. They have been treated conservatively in the past for the above stated problem and despite conservative measures, they continue to have progressive pain and severe functional limitations and dysfunction. They have failed non-operative management including home exercise, medications. It is felt that they would benefit from undergoing total joint replacement. Risks and benefits of the procedure have been discussed with the patient and they  elect to proceed with surgery. There are no active contraindications to surgery such as ongoing infection or rapidly progressive neurological disease.  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 father Hypertension  Mother.  Social History  Current work status  disabled Exercise  Exercises daily; does running / walking Former drinker  08/23/2015: In the past drank beer Living situation  live with spouse Marital status  married No history of drug/alcohol rehab  Tobacco / smoke exposure  08/23/2015: yes Tobacco use  Current every day smoker. 08/23/2015: smoke(d) 1 1/2 pack(s) per day Children  2  Medication History Oxycodone-Acetaminophen (10-325MG  Tablet, Oral) Active. Gabapentin (600MG  Tablet, Oral) Active. Valium (Oral) Specific strength unknown - Active. Lisinopril (20MG  Tablet, Oral) Active. Flexeril (10MG  Tablet, Oral) Active. Furosemide (20MG  Tablet, Oral) Active.  Past Surgical History Colon Polyp Removal - Colonoscopy  Foot Surgery  right Neck Disc Surgery  Rotator Cuff Repair  right Sinus Surgery  Spinal Fusion  neck and lower back Spinal Surgery  Vasectomy  2 iliac stent    Review of Systems  General Not Present- Chills, Fatigue, Fever, Memory Loss, Night Sweats, Weight Gain and Weight Loss. Skin Not Present- Eczema, Hives, Itching, Lesions and Rash. HEENT Not Present- Dentures, Double Vision, Headache, Hearing Loss, Tinnitus and Visual Loss. Respiratory Not Present- Allergies, Chronic Cough, Coughing up blood, Shortness of breath at rest and Shortness  of breath with exertion. Cardiovascular Not Present- Chest Pain, Difficulty Breathing Lying Down, Murmur, Palpitations,  Racing/skipping heartbeats and Swelling. Gastrointestinal Not Present- Abdominal Pain, Bloody Stool, Constipation, Diarrhea, Difficulty Swallowing, Heartburn, Jaundice, Loss of appetitie, Nausea and Vomiting. Male Genitourinary Not Present- Blood in Urine, Discharge, Flank Pain, Incontinence, Painful Urination, Urgency, Urinary frequency, Urinary Retention, Urinating at Night and Weak urinary stream. Musculoskeletal Present- Back Pain, Joint Pain and Muscle Weakness. Not Present- Joint Swelling, Morning Stiffness, Muscle Pain and Spasms. Neurological Not Present- Blackout spells, Difficulty with balance, Dizziness, Paralysis, Tremor and Weakness. Psychiatric Not Present- Insomnia.  Vitals  Weight: 206 lb Height: 69in Weight was reported by patient. Height was reported by patient. Body Surface Area: 2.09 m Body Mass Index: 30.42 kg/m  Pulse: 96 (Regular)  BP: 144/78 (Sitting, Left Arm, Standard)  Physical Exam General Mental Status -Alert, cooperative and good historian. General Appearance-pleasant, Not in acute distress. Orientation-Oriented X3. Build & Nutrition-Well nourished and Well developed.  Head and Neck Head-normocephalic, atraumatic . Neck Global Assessment - supple, no bruit auscultated on the right, no bruit auscultated on the left.  Eye Vision-Wears corrective lenses. Pupil - Bilateral-Regular and Round. Motion - Bilateral-EOMI.  ENMT Note: lower denture plate   Chest and Lung Exam Auscultation Breath sounds - clear at anterior chest wall and clear at posterior chest wall. Adventitious sounds - No Adventitious sounds.  Cardiovascular Auscultation Rhythm - Regular rate and rhythm. Heart Sounds - S1 WNL and S2 WNL. Murmurs & Other Heart Sounds - Auscultation of the heart reveals - No Murmurs.  Abdomen Palpation/Percussion Tenderness - Abdomen is non-tender to palpation. Rigidity (guarding) - Abdomen is  soft. Auscultation Auscultation of the abdomen reveals - Bowel sounds normal.  Male Genitourinary Note: Not done, not pertinent to present illness   Musculoskeletal Note: On exam, well-developed male, in no distress. Evaluation of his right hip, flexion to about 10 internal rotation, 20 external rotation, 20 abduction. Left hip flexion 120, rotation in 30, out 40, abduct 40 without discomfort.  RADIOGRAPHS I reviewed his plain radiographs. He has an area of high-grade osteonecrosis with subchondral sclerosis and collapse of his right femoral head. He does not have any severe secondary degenerative changes at this point. MRI shows involvement of almost the entire femoral head of the osteonecrotic process. He also has osteonecrosis of the left femoral head on the MRI involving about 30 to 40% without any collapse.  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  Topical TXA - PVD, Iliac Stent  Anesthesia Issues: None  Signed electronically by Ok Edwards, III PA-C

## 2015-11-20 NOTE — Anesthesia Postprocedure Evaluation (Signed)
Anesthesia Post Note  Patient: Derek [name]  Procedure(s) Performed: Procedure(s) (LRB): RIGHT TOTAL HIP ARTHROPLASTY ANTERIOR APPROACH (Right)  Patient location during evaluation: PACU Anesthesia Type: General Level of consciousness: awake and alert Pain management: pain level controlled Vital Signs Assessment: post-procedure vital signs reviewed and stable Respiratory status: spontaneous breathing, nonlabored ventilation, respiratory function stable and patient connected to nasal cannula oxygen Cardiovascular status: blood pressure returned to baseline and stable Postop Assessment: no signs of nausea or vomiting Anesthetic complications: no    Last Vitals:  Vitals:   11/20/15 1830 11/20/15 1845  BP: 140/80   Pulse: 85   Resp: 20 19  Temp:      Last Pain:  Vitals:   11/20/15 1845  TempSrc:   PainSc: Asleep                 Raima Geathers L

## 2015-11-20 NOTE — Anesthesia Preprocedure Evaluation (Addendum)
Anesthesia Evaluation  Patient identified by MRN, date of birth, ID band Patient awake    Reviewed: Allergy & Precautions, H&P , NPO status , Patient's Chart, lab work & pertinent test results  Airway Mallampati: IV  TM Distance: <3 FB Neck ROM: Full    Dental  (+) Edentulous Upper, Edentulous Lower, Dental Advisory Given   Pulmonary COPD, Current Smoker,    Pulmonary exam normal breath sounds clear to auscultation       Cardiovascular hypertension, + Peripheral Vascular Disease  Normal cardiovascular exam Rhythm:Regular Rate:Normal  LAFB   Neuro/Psych  Headaches, negative psych ROS   GI/Hepatic negative GI ROS, Neg liver ROS,   Endo/Other  negative endocrine ROS  Renal/GU negative Renal ROS  negative genitourinary   Musculoskeletal  (+) Arthritis ,   Abdominal   Peds  Hematology negative hematology ROS (+)   Anesthesia Other Findings   Reproductive/Obstetrics negative OB ROS                             Anesthesia Physical  Anesthesia Plan  ASA: III  Anesthesia Plan: General   Post-op Pain Management:    Induction: Intravenous  Airway Management Planned: Oral ETT  Additional Equipment:   Intra-op Plan:   Post-operative Plan: Extubation in OR  Informed Consent: I have reviewed the patients History and Physical, chart, labs and discussed the procedure including the risks, benefits and alternatives for the proposed anesthesia with the patient or authorized representative who has indicated his/her understanding and acceptance.   Dental advisory given  Plan Discussed with: CRNA and Anesthesiologist  Anesthesia Plan Comments:        Anesthesia Quick Evaluation

## 2015-11-20 NOTE — Anesthesia Procedure Notes (Signed)
Procedure Name: Intubation Performed by: Gean Maidens Pre-anesthesia Checklist: Patient identified, Emergency Drugs available, Suction available, Patient being monitored and Timeout performed Patient Re-evaluated:Patient Re-evaluated prior to inductionOxygen Delivery Method: Circle system utilized Preoxygenation: Pre-oxygenation with 100% oxygen Intubation Type: IV induction Ventilation: Mask ventilation without difficulty Laryngoscope Size: Mac and 4 Grade View: Grade I Tube type: Oral Number of attempts: 1 Airway Equipment and Method: Stylet Placement Confirmation: ETT inserted through vocal cords under direct vision,  positive ETCO2,  CO2 detector and breath sounds checked- equal and bilateral Secured at: 23 cm Tube secured with: Tape Dental Injury: Teeth and Oropharynx as per pre-operative assessment

## 2015-11-20 NOTE — Interval H&P Note (Signed)
History and Physical Interval Note:  11/20/2015 3:13 PM  Derek [name]  has presented today for surgery, with the diagnosis of right hip AVN  The various methods of treatment have been discussed with the patient and family. After consideration of risks, benefits and other options for treatment, the patient has consented to  Procedure(s): RIGHT TOTAL HIP ARTHROPLASTY ANTERIOR APPROACH (Right) as a surgical intervention .  The patient's history has been reviewed, patient examined, no change in status, stable for surgery.  I have reviewed the patient's chart and labs.  Questions were answered to the patient's satisfaction.     [name]

## 2015-11-20 NOTE — Transfer of Care (Signed)
Immediate Anesthesia Transfer of Care Note  Patient: Derek [name]  Procedure(s) Performed: Procedure(s): RIGHT TOTAL HIP ARTHROPLASTY ANTERIOR APPROACH (Right)  Patient Location: PACU  Anesthesia Type:General  Level of Consciousness: awake, alert  and oriented  Airway & Oxygen Therapy: Patient Spontanous Breathing and Patient connected to face mask oxygen  Post-op Assessment: Report given to RN and Post -op Vital signs reviewed and stable  Post vital signs: Reviewed and stable  Last Vitals:  Vitals:   11/20/15 1354  BP: (!) 152/77  Pulse: (!) 101  Resp: 18  Temp: 36.5 C    Last Pain:  Vitals:   11/20/15 1403  TempSrc:   PainSc: 6          Complications: No apparent anesthesia complications

## 2015-11-21 ENCOUNTER — Encounter (HOSPITAL_COMMUNITY): Payer: Self-pay

## 2015-11-21 LAB — BASIC METABOLIC PANEL
ANION GAP: 7 (ref 5–15)
BUN: 6 mg/dL (ref 6–20)
CALCIUM: 8.4 mg/dL — AB (ref 8.9–10.3)
CO2: 24 mmol/L (ref 22–32)
Chloride: 103 mmol/L (ref 101–111)
Creatinine, Ser: 0.8 mg/dL (ref 0.61–1.24)
GFR calc Af Amer: >60 mL/min (ref >60)
GFR calc non Af Amer: >60 mL/min (ref >60)
GLUCOSE: 174 mg/dL — AB (ref 65–99)
Potassium: 4.5 mmol/L (ref 3.5–5.1)
Sodium: 134 mmol/L — ABNORMAL LOW (ref 135–145)

## 2015-11-21 LAB — CBC
HEMATOCRIT: 29.5 % — AB (ref 39.0–52.0)
Hemoglobin: 9.7 g/dL — ABNORMAL LOW (ref 13.0–17.0)
MCH: 32.1 pg (ref 26.0–34.0)
MCHC: 32.9 g/dL (ref 30.0–36.0)
MCV: 97.7 fL (ref 78.0–100.0)
Platelets: 343 10*3/uL (ref 150–400)
RBC: 3.02 MIL/uL — ABNORMAL LOW (ref 4.22–5.81)
RDW: 17.1 % — AB (ref 11.5–15.5)
WBC: 12.4 10*3/uL — AB (ref 4.0–10.5)

## 2015-11-21 NOTE — Progress Notes (Signed)
Physical Therapy Treatment Patient Details Name: Derek [name] MRN: [id number] DOB: Sep 13, 1945 Today's Date: 11/21/2015    History of Present Illness s/p R DA THA    PT Comments    The  Wife reports that the patient had just used the IS x 10 times before this Probation officer entered room. The patient was noted to not arouse except with strong sternal rub. RN in to assess. The patient  Noted to  Have  Very low/shallow respirations with sats dropping. The RN spoke with PA. Left patient in upright sitting in bed with nursing staff. Continue PT while in acute care.  Follow Up Recommendations  SNF;Supervision/Assistance - 24 hour     Equipment Recommendations  None recommended by PT    Recommendations for Other Services       Precautions / Restrictions Precautions Precautions: Fall Restrictions Weight Bearing Restrictions: No    Mobility  Bed Mobility            General bed mobility comments: SAT PATIENT UPRIGHT IN BED POSITION TO SEE IF HE WOULD IMPROVE AROUSAL but did not. RN in to assess. Noted sats dropping to 85% on 2 liters when appears to have very shallow breaths. HR up to 115 with theses drops.  Transfers      Ambulation/Gait                 Stairs            Wheelchair Mobility    Modified Rankin (Stroke Patients Only)       Balance Overall balance assessment: Needs assistance Sitting-balance support: Feet supported;Bilateral upper extremity supported Sitting balance-Leahy Scale: Poor                              Cognition Arousal/Alertness: Lethargic Behavior During Therapy: Flat affect Overall Cognitive Status: Impaired/Different from baseline                 General Comments: The PATIENT IS NOT RESPONSIVE TO STIMULI EXCEPT STRONG STERNAL RUB, NOTED PERIODS OF VERY SHALLOW TO NIL BRATHS     Exercises Total Joint Exercises Heel Slides: PROM;Right;20 reps Hip ABduction/ADduction: PROM;Right;20 reps    General Comments         Pertinent Vitals/Pain Pain Assessment: Faces Faces Pain Scale: Hurts little more Pain Location: patient is not responsive to PROM of the R LE at this time.   Pain Descriptors / Indicators: Grimacing;Guarding;Moaning Pain Intervention(s): Monitored during session    Home Living Family/patient expects to be discharged to:: Private residence Living Arrangements: Spouse/significant other Available Help at Discharge: Family;Friend(s);Available 24 hours/day Type of Home: House Home Access: Level entry   Home Layout: One level Home Equipment: Walker - 2 wheels;Wheelchair - manual;Tub bench Additional Comments: Pt from home with wife    Prior Function Level of Independence: Needs assistance  Gait / Transfers Assistance Needed: Non ambulatory since mid June , has been WC dependent and uses mobility transportation [place] with transfers from bed to [facility] to tub bench       PT Goals (current goals can now be found in the care plan section) Acute Rehab PT Goals Patient Stated Goal: Per wife, to walk again.  PT Goal Formulation: With family Time For Goal Achievement: 11/28/15 Potential to Achieve Goals: Good Progress towards PT goals: Not progressing toward goals - comment (lethargic  at this time)    Frequency  7X/week    PT Plan Current plan remains  appropriate    Co-evaluation PT/OT/SLP Co-Evaluation/Treatment: Yes Reason for Co-Treatment: For patient/therapist safety PT goals addressed during session: Mobility/safety with mobility OT goals addressed during session: ADL's and self-care     End of Session Equipment Utilized During Treatment: Gait belt Activity Tolerance: Treatment limited secondary to medical complications (Comment) (The patient is not arousable) Patient left: in bed;with call bell/phone within reach;with family/visitor present;with nursing/sitter in room     Time: 1510-1550 PT Time Calculation (min) (ACUTE ONLY): 40 min  Charges:   $Therapeutic Exercise: 8-22 mins $Self Care/Home Management: 23-37                    G Codes:      [name] 11/21/2015, 4:30 PM [name] PT [phone]

## 2015-11-21 NOTE — Evaluation (Signed)
Physical Therapy Evaluation Patient Details Name: Derek [name] MRN: [id number] DOB: 05/15/1945 Today's Date: 11/21/2015   History of Present Illness  s/p R DA THA  Clinical Impression  The patient is lethargic, required max stimulation to arouse. Requiring 2 person assist to stand and pivot  Back to bed. Discussed SNF option with wife as she is the Only caregiver 24/7.  Will see how he arouses and progresses. The patient will benefit from PT while in acute care to address the problems listed in the note below.  Follow Up Recommendations SNF;Supervision/Assistance - 24 hour    Equipment Recommendations  None recommended by PT    Recommendations for Other Services       Precautions / Restrictions Precautions Precautions: Fall Restrictions Weight Bearing Restrictions: No      Mobility  Bed Mobility Overal bed mobility: +2 for physical assistance;Needs Assistance       Supine to sit: Max assist;+2 for physical assistance Sit to supine: Max assist;+2 for physical assistance   General bed mobility comments: assist for legs and trunk  Transfers Overall transfer level: Needs assistance Equipment used: Rolling walker (2 wheeled) Transfers: Sit to/from Stand Sit to Stand: Max assist;+2 physical assistance Stand pivot transfers: Mod assist;+2 physical assistance       General transfer comment: assist to rise and steady; cues for sequence, multimodal cues  to attempt taking  small step To pivot on the Left  Leg, poorly able to turn initially. With extra time and cues was able to hop 2 steps back toward the recliner. Ambulation/Gait                Stairs            Wheelchair Mobility    Modified Rankin (Stroke Patients Only)       Balance Overall balance assessment: Needs assistance Sitting-balance support: Feet supported;Bilateral upper extremity supported Sitting balance-Leahy Scale: Poor                                        Pertinent Vitals/Pain Pain Assessment: Faces Faces Pain Scale: Hurts little more Pain Location: when R leg placed onto bed Pain Descriptors / Indicators: Grimacing;Guarding;Moaning Pain Intervention(s): Monitored during session;Premedicated before session;Repositioned    Home Living Family/patient expects to be discharged to:: Private residence Living Arrangements: Spouse/significant other Available Help at Discharge: Family;Friend(s);Available 24 hours/day Type of Home: House Home Access: Level entry     Home Layout: One level Home Equipment: Walker - 2 wheels;Wheelchair - manual;Tub bench Additional Comments: Pt from home with wife    Prior Function Level of Independence: Needs assistance   Gait / Transfers Assistance Needed: Non ambulatory since mid June , has been WC dependent and uses mobility transportation [place] with transfers from bed to [facility] to tub bench           Hand Dominance        Extremity/Trunk Assessment   Upper Extremity Assessment: Defer to OT evaluation           Lower Extremity Assessment: RLE deficits/detail RLE Deficits / Details: bears weight in standing but not enough to take a step with the  left foot,    Cervical / Trunk Assessment: Other exceptions  Communication   Communication:  (voice quiet and not clear at times)  Cognition Arousal/Alertness: Lethargic;Suspect due to medications Behavior During Therapy: Flat affect Overall Cognitive Status: Difficult to assess  General Comments      Exercises        Assessment/Plan    PT Assessment Patient needs continued PT services  PT Diagnosis Difficulty walking;Generalized weakness;Acute pain;Altered mental status   PT Problem List Decreased strength;Decreased range of motion;Decreased activity tolerance;Decreased balance;Decreased mobility;Decreased knowledge of precautions;Decreased safety awareness;Decreased knowledge of use of  DME;Pain;Decreased cognition  PT Treatment Interventions DME instruction;Gait training;Functional mobility training;Therapeutic activities;Therapeutic exercise;Patient/family education   PT Goals (Current goals can be found in the Care Plan section) Acute Rehab PT Goals Patient Stated Goal: Per wife, to walk again.  PT Goal Formulation: With family Time For Goal Achievement: 11/28/15 Potential to Achieve Goals: Good    Frequency 7X/week   Barriers to discharge Decreased caregiver support      Co-evaluation PT/OT/SLP Co-Evaluation/Treatment: Yes Reason for Co-Treatment: For patient/therapist safety PT goals addressed during session: Mobility/safety with mobility OT goals addressed during session: ADL's and self-care       End of Session Equipment Utilized During Treatment: Gait belt Activity Tolerance: Patient limited by fatigue Patient left: in bed;with call bell/phone within reach;with bed alarm set;with family/visitor present Nurse Communication: Mobility status         Time: [id number] PT Time Calculation (min) (ACUTE ONLY): 29 min   Charges:   PT Evaluation $PT Eval Moderate Complexity: 1 Procedure     PT G CodesClaretha Cooper 11/21/2015, 2:39 PM [name] PT [phone]

## 2015-11-21 NOTE — Progress Notes (Signed)
   11/21/15 1300  OT Visit Information  Last OT Received On 11/21/15  Assistance Needed +2  PT/OT/SLP Co-Evaluation/Treatment Yes  Reason for Co-Treatment For patient/therapist safety  PT goals addressed during session Mobility/safety with mobility  OT goals addressed during session ADL's and self-care  History of Present Illness s/p R DA THA  Precautions  Precautions Fall  Pain Assessment  Pain Assessment Faces  Faces Pain Scale 2 (none when back in bed)  Pain Location RLE  Pain Intervention(s) Limited activity within patient's tolerance;Monitored during session;Premedicated before session;Repositioned  Cognition  Arousal/Alertness Lethargic;Suspect due to medications  Behavior During Therapy Flat affect  Overall Cognitive Status Difficult to assess  Difficult to assess due to Level of arousal  ADL  General ADL Comments pt needed max A +2 for sit to stand from recliner, built up with a pillow.  He was more alert once we woke him up and transfer was better back to bed.  Pt does have a BSC and tub bench at home.  Wife was able to assist him on her own prior to this sx.  Bed Mobility  Sit to supine Max assist;+2 for physical assistance  General bed mobility comments assist for legs and trunk  Restrictions  Weight Bearing Restrictions No  Transfers  Equipment used Rolling walker (2 wheeled)  Transfers Sit to/from Stand  Sit to Stand Max assist;+2 physical assistance (from recliner)  Stand pivot transfers Mod assist;+2 physical assistance  General transfer comment assist to rise and steady; cues for sequence  OT - End of Session  Activity Tolerance Patient limited by fatigue;Patient limited by pain  Patient left in bed;with call bell/phone within reach;with bed alarm set;with family/visitor present  Nurse Communication Mobility status  OT Assessment/Plan  OT Frequency (ACUTE ONLY) Min 2X/week  Follow Up Recommendations SNF;Supervision/Assistance - 24 hour  OT Equipment 3 in 1  bedside comode  OT Goal Progression  Progress towards OT goals Progressing toward goals  OT Time Calculation  OT Start Time (ACUTE ONLY) 1258  OT Stop Time (ACUTE ONLY) 1327  OT Time Calculation (min) 29 min  OT General Charges  $OT Visit 1 Procedure  OT Treatments  $Therapeutic Activity 8-22 mins  [place], OTR/L [phone] 11/21/2015

## 2015-11-21 NOTE — Discharge Instructions (Addendum)
° °Dr. Frank Aluisio °Total Joint Specialist °[facility] °[address] °[place],  [address] °[phone] ° °ANTERIOR APPROACH TOTAL HIP REPLACEMENT POSTOPERATIVE DIRECTIONS ° ° °Hip Rehabilitation, Guidelines Following Surgery  °The results of a hip operation are greatly improved after range of motion and muscle strengthening exercises. Follow all safety measures which are given to protect your hip. If any of these exercises cause increased pain or swelling in your joint, decrease the amount until you are comfortable again. Then slowly increase the exercises. Call your caregiver if you have problems or questions.  ° °HOME CARE INSTRUCTIONS  °Remove items at home which could result in a fall. This includes throw rugs or furniture in walking pathways.  °· ICE to the affected hip every three hours for 30 minutes at a time and then as needed for pain and swelling.  Continue to use ice on the hip for pain and swelling from surgery. You may notice swelling that will progress down to the foot and ankle.  This is normal after surgery.  Elevate the leg when you are not up walking on it.   °· Continue to use the breathing machine which will help keep your temperature down.  It is common for your temperature to cycle up and down following surgery, especially at night when you are not up moving around and exerting yourself.  The breathing machine keeps your lungs expanded and your temperature down. ° ° °DIET °You may resume your previous home diet once your are discharged from the hospital. ° °DRESSING / WOUND CARE / SHOWERING °You may shower 3 days after surgery, but keep the wounds dry during showering.  You may use an occlusive plastic wrap (Press'n Seal for example), NO SOAKING/SUBMERGING IN THE BATHTUB.  If the bandage gets wet, change with a clean dry gauze.  If the incision gets wet, pat the wound dry with a clean towel. °You may start showering once you are discharged home but do not  submerge the incision under water. Just pat the incision dry and apply a dry gauze dressing on daily. °Change the surgical dressing daily and reapply a dry dressing each time. ° °ACTIVITY °Walk with your walker as instructed. °Use walker as long as suggested by your caregivers. °Avoid periods of inactivity such as sitting longer than an hour when not asleep. This helps prevent blood clots.  °You may resume a sexual relationship in one month or when given the OK by your doctor.  °You may return to work once you are cleared by your doctor.  °Do not drive a car for 6 weeks or until released by you surgeon.  °Do not drive while taking narcotics. ° °WEIGHT BEARING °Weight bearing as tolerated with assist device (walker, cane, etc) as directed, use it as long as suggested by your surgeon or therapist, typically at least 4-6 weeks. ° °POSTOPERATIVE CONSTIPATION PROTOCOL °Constipation - defined medically as fewer than three stools per week and severe constipation as less than one stool per week. ° °One of the most common issues patients have following surgery is constipation.  Even if you have a regular bowel pattern at home, your normal regimen is likely to be disrupted due to multiple reasons following surgery.  Combination of anesthesia, postoperative narcotics, change in appetite and fluid intake all can affect your bowels.  In order to avoid complications following surgery, here are some recommendations in order to help you during your recovery period. ° °Colace (docusate) - Pick up an over-the-counter   form of Colace or another stool softener and take twice a day as long as you are requiring postoperative pain medications.  Take with a full glass of water daily.  If you experience loose stools or diarrhea, hold the colace until you stool forms back up.  If your symptoms do not get better within 1 week or if they get worse, check with your doctor. ° °Dulcolax (bisacodyl) - Pick up over-the-counter and take as directed  by the product packaging as needed to assist with the movement of your bowels.  Take with a full glass of water.  Use this product as needed if not relieved by Colace only.  ° °MiraLax (polyethylene glycol) - Pick up over-the-counter to have on hand.  MiraLax is a solution that will increase the amount of water in your bowels to assist with bowel movements.  Take as directed and can mix with a glass of water, juice, soda, coffee, or tea.  Take if you go more than two days without a movement. °Do not use MiraLax more than once per day. Call your doctor if you are still constipated or irregular after using this medication for 7 days in a row. ° °If you continue to have problems with postoperative constipation, please contact the office for further assistance and recommendations.  If you experience "the worst abdominal pain ever" or develop nausea or vomiting, please contact the office immediatly for further recommendations for treatment. ° °ITCHING ° If you experience itching with your medications, try taking only a single pain pill, or even half a pain pill at a time.  You can also use Benadryl over the counter for itching or also to help with sleep.  ° °TED HOSE STOCKINGS °Wear the elastic stockings on both legs for three weeks following surgery during the day but you may remove then at night for sleeping. ° °MEDICATIONS °See your medication summary on the “After Visit Summary” that the nursing staff will review with you prior to discharge.  You may have some home medications which will be placed on hold until you complete the course of blood thinner medication.  It is important for you to complete the blood thinner medication as prescribed by your surgeon.  Continue your approved medications as instructed at time of discharge. ° °PRECAUTIONS °If you experience chest pain or shortness of breath - call 911 immediately for transfer to the hospital emergency department.  °If you develop a fever greater that 101 F,  purulent drainage from wound, increased redness or drainage from wound, foul odor from the wound/dressing, or calf pain - CONTACT YOUR SURGEON.   °                                                °FOLLOW-UP APPOINTMENTS °Make sure you keep all of your appointments after your operation with your surgeon and caregivers. You should call the office at the above phone number and make an appointment for approximately two weeks after the date of your surgery or on the date instructed by your surgeon outlined in the "After Visit Summary". ° °RANGE OF MOTION AND STRENGTHENING EXERCISES  °These exercises are designed to help you keep full movement of your hip joint. Follow your caregiver's or physical therapist's instructions. Perform all exercises about fifteen times, three times per day or as directed. Exercise both hips, even if you   have had only one joint replacement. These exercises can be done on a training (exercise) mat, on the floor, on a table or on a bed. Use whatever works the best and is most comfortable for you. Use music or television while you are exercising so that the exercises are a pleasant break in your day. This will make your life better with the exercises acting as a break in routine you can look forward to.  Lying on your back, slowly slide your foot toward your buttocks, raising your knee up off the floor. Then slowly slide your foot back down until your leg is straight again.  Lying on your back spread your legs as far apart as you can without causing discomfort.  Lying on your side, raise your upper leg and foot straight up from the floor as far as is comfortable. Slowly lower the leg and repeat.  Lying on your back, tighten up the muscle in the front of your thigh (quadriceps muscles). You can do this by keeping your leg straight and trying to raise your heel off the floor. This helps strengthen the largest muscle supporting your knee.  Lying on your back, tighten up the muscles of your  buttocks both with the legs straight and with the knee bent at a comfortable angle while keeping your heel on the floor.   IF YOU ARE TRANSFERRED TO A SKILLED REHAB FACILITY If the patient is transferred to a skilled rehab facility following release from the hospital, a list of the current medications will be sent to the facility for the patient to continue.  When discharged from the skilled rehab facility, please have the facility set up the patient's [place] prior to being released. Also, the skilled facility will be responsible for providing the patient with their medications at time of release from the facility to include their pain medication, the muscle relaxants, and their blood thinner medication. If the patient is still at the rehab facility at time of the two week follow up appointment, the skilled rehab facility will also need to assist the patient in arranging follow up appointment in our office and any transportation needs.  MAKE SURE YOU:  Understand these instructions.  Get help right away if you are not doing well or get worse.    Pick up stool softner and laxative for home use following surgery while on pain medications. Do not submerge incision under water. Please use good hand washing techniques while changing dressing each day. May shower starting three days after surgery. Please use a clean towel to pat the incision dry following showers. Continue to use ice for pain and swelling after surgery. Do not use any lotions or creams on the incision until instructed by your surgeon.  Take Xarelto for two and a half more weeks, then discontinue Xarelto. Once the patient has completed the blood thinner regimen, then take a Baby 81 mg Aspirin daily for three more weeks.    Information on my medicine - XARELTO (Rivaroxaban)  This medication education was reviewed with me or my healthcare representative as part of my discharge preparation.  The pharmacist that  spoke with me during my hospital stay was:  Laqueta Jean  Why was Xarelto prescribed for you? Xarelto was prescribed for you to reduce the risk of blood clots forming after orthopedic surgery. The medical term for these abnormal blood clots is venous thromboembolism (VTE).  What do you need to know about xarelto ? Take your Xarelto  ONCE DAILY at the same time every day. You may take it either with or without food.  If you have difficulty swallowing the tablet whole, you may crush it and mix in applesauce just prior to taking your dose.  Take Xarelto exactly as prescribed by your doctor and DO NOT stop taking Xarelto without talking to the doctor who prescribed the medication.  Stopping without other VTE prevention medication to take the place of Xarelto may increase your risk of developing a clot.  After discharge, you should have regular check-up appointments with your healthcare provider that is prescribing your Xarelto.    What do you do if you miss a dose? If you miss a dose, take it as soon as you remember on the same day then continue your regularly scheduled once daily regimen the next day. Do not take two doses of Xarelto on the same day.   Important Safety Information A possible side effect of Xarelto is bleeding. You should call your healthcare provider right away if you experience any of the following: ? Bleeding from an injury or your nose that does not stop. ? Unusual colored urine (red or dark brown) or unusual colored stools (red or black). ? Unusual bruising for unknown reasons. ? A serious fall or if you hit your head (even if there is no bleeding).  Some medicines may interact with Xarelto and might increase your risk of bleeding while on Xarelto. To help avoid this, consult your healthcare provider or pharmacist prior to using any new prescription or non-prescription medications, including herbals, vitamins, non-steroidal anti-inflammatory drugs (NSAIDs) and  supplements.  This website has more information on Xarelto: [email or web address]

## 2015-11-21 NOTE — Progress Notes (Signed)
   Subjective: 1 Day Post-Op Procedure(s) (LRB): RIGHT TOTAL HIP ARTHROPLASTY ANTERIOR APPROACH (Right) Patient reports pain as moderate.   Patient seen in rounds by Dr. Wynelle Link. Patient is well, but has had some minor complaints of pain in the hip, requiring pain medications We will start therapy today.  Plan is to go Home after hospital stay.  Objective: Vital signs in last 24 hours: Temp:  [97.4 F (36.3 C)-98.2 F (36.8 C)] 98 F (36.7 C) (08/03 0700) Pulse Rate:  [85-114] 113 (08/03 0700) Resp:  [16-22] 20 (08/03 0700) BP: (140-173)/(70-88) 155/70 (08/03 0700) SpO2:  [97 %-100 %] 98 % (08/03 0700) Weight:  [89.8 kg (198 lb)] 89.8 kg (198 lb) (08/02 1354)  Intake/Output from previous day:  Intake/Output Summary (Last 24 hours) at 11/21/15 0703 Last data filed at 11/21/15 0600  Gross per 24 hour  Intake          4668.33 ml  Output             3100 ml  Net          1568.33 ml    Intake/Output this shift: No intake/output data recorded.  Labs:  Recent Labs  11/21/15 0447  HGB 9.7*    Recent Labs  11/21/15 0447  WBC 12.4*  RBC 3.02*  HCT 29.5*  PLT 343    Recent Labs  11/21/15 0447  NA 134*  K 4.5  CL 103  CO2 24  BUN 6  CREATININE 0.80  GLUCOSE 174*  CALCIUM 8.4*   No results for input(s): LABPT, INR in the last 72 hours.  EXAM General - Patient is Alert, Appropriate and Oriented Extremity - Neurovascular intact Sensation intact distally Dorsiflexion/Plantar flexion intact Dressing - dressing C/D/I Motor Function - intact, moving foot and toes well on exam.  Hemovac pulled without difficulty.  Past Medical History:  Diagnosis Date  . Adenomatous polyps    remote past, on 5 year surveillance  . Arthritis   . Bell's palsy 40+yrs ago  . Cancer ([place])    skin  . 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 Diverticulitis   . Enlarged prostate   . Flat affect   . Headache(784.0)    having recently  .  Hypertension   . Peripheral vascular disease ([place])   . Pneumonia     Assessment/Plan: 1 Day Post-Op Procedure(s) (LRB): RIGHT TOTAL HIP ARTHROPLASTY ANTERIOR APPROACH (Right) Principal Problem:   Avascular necrosis of bone of right hip (HCC) Active Problems:   OA (osteoarthritis) of hip  Estimated body mass index is 29.24 kg/m as calculated from the following:   Height as of this encounter: 5\' 9"  (1.753 m).   Weight as of this encounter: 89.8 kg (198 lb). Advance diet Up with therapy Plan for discharge tomorrow Discharge home with home health  DVT Prophylaxis - Xarelto Weight Bearing As Tolerated right Leg Hemovac Pulled Begin Therapy  Arlee Muslim, PA-C Orthopaedic Surgery 11/21/2015, 7:03 AM

## 2015-11-21 NOTE — Evaluation (Signed)
Occupational Therapy Evaluation Patient Details Name: Derek [name] MRN: [id number] DOB: [birth year] Today's Date: 11/21/2015    History of Present Illness s/p R DA THA   Clinical Impression   This 70 year old man was admitted for the above sx.  He was sleepy during evaluation, likely from medication. Will benefit from continued OT. Goals in acute are for min A for toilet transfers and mobility related to ADLS.  Pt currently needs mod +2 assistance for most mobility (and max +2 for bed mobility)    Follow Up Recommendations  SNF;Supervision/Assistance - 24 hour (depending upon progress)    Equipment Recommendations  3 in 1 bedside comode    Recommendations for Other Services       Precautions / Restrictions Precautions Precautions: Fall Restrictions Weight Bearing Restrictions: No      Mobility Bed Mobility Overal bed mobility: +2 for physical assistance;Needs Assistance       Supine to sit: Max assist;+2 for physical assistance     General bed mobility comments: pt sleepy; assist for bil LEs and trunk  Transfers Overall transfer level: Needs assistance Equipment used: Rolling walker (2 wheeled) Transfers: Sit to/from [facility] Sit to Stand: Mod assist;+2 physical assistance;From elevated surface Stand pivot transfers: Mod assist;+2 physical assistance       General transfer comment: assist to rise and stabilize. Cues for posture, to stand upright.  Pt used heel/toe method to pivot to chair. R knee blocked for safety    Balance                                            ADL Overall ADL's : Needs assistance/impaired                         Toilet Transfer: Moderate assistance;+2 for physical assistance;Stand-pivot;RW (to chair)             General ADL Comments: Pt sleepy--had woken him up.  Improved when he sat up but he needed multmodal cues for SPT.  He can perform UB adls with set up and cues to continue      Vision     Perception     Praxis      Pertinent Vitals/Pain Pain Assessment: Faces Faces Pain Scale: Hurts even more Pain Location: R LE when repositioned in chair Pain Intervention(s): Limited activity within patient's tolerance;Monitored during session;Premedicated before session;Repositioned;Ice applied     Hand Dominance     Extremity/Trunk Assessment Upper Extremity Assessment Upper Extremity Assessment: Overall WFL for tasks assessed           Communication Communication Communication: No difficulties   Cognition Arousal/Alertness: Lethargic;Suspect due to medications Behavior During Therapy: [facility] for tasks assessed/performed Overall Cognitive Status: Difficult to assess                     General Comments       Exercises       Shoulder Instructions      Home Living Family/patient expects to be discharged to:: Unsure Living Arrangements: Spouse/significant other                               Additional Comments: Pt from home with wife      Prior Functioning/Environment  OT Diagnosis: Acute pain   OT Problem List: Decreased strength;Decreased activity tolerance;Decreased knowledge of use of DME or AE;Pain;Impaired balance (sitting and/or standing);Decreased cognition;Decreased safety awareness   OT Treatment/Interventions: Self-care/ADL training;DME and/or AE instruction;Therapeutic activities;Patient/family education;Balance training;Cognitive remediation/compensation    OT Goals(Current goals can be found in the care plan section) Acute Rehab OT Goals Patient Stated Goal: none stated OT Goal Formulation: With family Time For Goal Achievement: 11/28/15 Potential to Achieve Goals: Good ADL Goals Pt Will Transfer to Toilet: with min assist;stand pivot transfer;bedside commode Additional ADL Goal #1: pt will go from sit to stand from high surface with min A and maintain for 2 minutes for adls with min  guard assistance Additional ADL Goal #2: pt will perform bed mobility with min A in preparation for adls  OT Frequency: Min 2X/week   Barriers to D/C:            Co-evaluation              End of Session Nurse Communication: Mobility status (pain and sleepiness)  Activity Tolerance: Patient limited by fatigue;Patient limited by pain Patient left: in chair;with call bell/phone within reach;with chair alarm set;with family/visitor present   Time: [id number] OT Time Calculation (min): 21 min Charges:  OT General Charges $OT Visit: 1 Procedure OT Evaluation $OT Eval Moderate Complexity: 1 Procedure G-Codes:    Derek [name] 12/02/2015, 11:54 AM Lesle Chris, OTR/L [phone] 12-02-2015

## 2015-11-21 NOTE — Care Management Note (Signed)
Case Management Note  Patient Details  Name: Derek [name] MRN: [id number] Date of Birth: 1945/08/29  Subjective/Objective:                  RIGHT TOTAL HIP ARTHROPLASTY ANTERIOR APPROACH (Right) Action/Plan: Discharge planning Expected Discharge Date:  11/22/15               Expected Discharge Plan:  [place]  In-House Referral:     Discharge planning Services  CM Consult  Post Acute Care Choice:  Home Health Choice offered to:  Patient, Spouse  DME Arranged:  N/A DME Agency:  NA  HH Arranged:  PT [place] Agency:  [facility]  Status of Service:  Completed, signed off  If discussed at [employer], dates discussed:    Additional Comments: CM met with pt and spouse  [name] [phone], in room to offer choice of home health agency.  [name] chooses [facility] (formerly [facility] to render HHPT.  Referral given to rep, Tim of [facility].  [name] states pt has all DME needed at home and denies need for additional DME.  NO other CM needs were communicated. Dellie Catholic, RN 11/21/2015, 2:09 PM

## 2015-11-22 LAB — BASIC METABOLIC PANEL
Anion gap: 6 (ref 5–15)
BUN: 9 mg/dL (ref 6–20)
CALCIUM: 8.8 mg/dL — AB (ref 8.9–10.3)
CO2: 29 mmol/L (ref 22–32)
CREATININE: 0.7 mg/dL (ref 0.61–1.24)
Chloride: 103 mmol/L (ref 101–111)
GFR calc non Af Amer: >60 mL/min (ref >60)
GLUCOSE: 112 mg/dL — AB (ref 65–99)
Potassium: 4.1 mmol/L (ref 3.5–5.1)
Sodium: 138 mmol/L (ref 135–145)

## 2015-11-22 LAB — CBC
HEMATOCRIT: 27.3 % — AB (ref 39.0–52.0)
Hemoglobin: 9.1 g/dL — ABNORMAL LOW (ref 13.0–17.0)
MCH: 32.6 pg (ref 26.0–34.0)
MCHC: 33.3 g/dL (ref 30.0–36.0)
MCV: 97.8 fL (ref 78.0–100.0)
Platelets: 326 10*3/uL (ref 150–400)
RBC: 2.79 MIL/uL — ABNORMAL LOW (ref 4.22–5.81)
RDW: 17.4 % — AB (ref 11.5–15.5)
WBC: 17.5 10*3/uL — ABNORMAL HIGH (ref 4.0–10.5)

## 2015-11-22 MED ORDER — RIVAROXABAN 10 MG PO TABS: 10.0000 mg | ORAL_TABLET | Freq: Every day | ORAL | 0 refills | Status: DC

## 2015-11-22 MED ORDER — CYCLOBENZAPRINE HCL 10 MG PO TABS: 10.0000 mg | ORAL_TABLET | Freq: Three times a day (TID) | ORAL | 0 refills | Status: DC | PRN

## 2015-11-22 MED ORDER — OXYCODONE HCL 5 MG PO TABS: 5.0000 mg | ORAL_TABLET | ORAL | Status: DC | PRN

## 2015-11-22 MED ORDER — OXYCODONE HCL 5 MG PO TABS: 5.0000 mg | ORAL_TABLET | ORAL | 0 refills | Status: DC | PRN

## 2015-11-22 MED ORDER — TRAMADOL HCL 50 MG PO TABS: 50.0000 mg | ORAL_TABLET | Freq: Four times a day (QID) | ORAL | 1 refills | Status: DC | PRN

## 2015-11-22 NOTE — Progress Notes (Signed)
Occupational Therapy Treatment Patient Details Name: Derek [name] MRN: [id number] DOB: 1946/02/25 Today's Date: 11/22/2015    History of present illness s/p R DA THA   OT comments  Pt MUCH improved. Wife will be able to safely assist him at home.  Follow Up Recommendations  Supervision/Assistance - 24 hour    Equipment Recommendations  3 in 1 bedside comode (if he doesn't have this)    Recommendations for Other Services      Precautions / Restrictions Precautions Precautions: Fall Restrictions Weight Bearing Restrictions: No Other Position/Activity Restrictions: WBAT       Mobility Bed Mobility Overal bed mobility: Needs Assistance Bed Mobility: Supine to Sit     Supine to sit: Min assist     General bed mobility comments: oob  Transfers Overall transfer level: Needs assistance Equipment used: Rolling walker (2 wheeled) Transfers: Sit to/from Stand Sit to Stand: Min assist         General transfer comment: steadying assistance and cues for UE placement    Balance                                   ADL                                         General ADL Comments: pt ambulated to bathroom, but did not need to sit on commode this session. Able to stand with min A and maintain with min guard for 2 minutes. Wife feels comfortable with assisting him at home      Vision                     Perception     Praxis      Cognition   Behavior During Therapy: [facility] for tasks assessed/performed Overall Cognitive Status: Within Functional Limits for tasks assessed                  General Comments: much more alert; followed all commands without difficulty    Extremity/Trunk Assessment               Exercises Total Joint Exercises Ankle Circles/Pumps: AROM;10 reps;Both Quad Sets: AROM;Both;10 reps Heel Slides: AAROM;Right;10 reps   Shoulder Instructions       General Comments      Pertinent  Vitals/ Pain       Pain Assessment: 0-10 Faces Pain Scale: Hurts little more Pain Descriptors / Indicators: Grimacing Pain Intervention(s): Limited activity within patient's tolerance;Monitored during session;Repositioned;Premedicated before session;Ice applied  Home Living                                          Prior Functioning/Environment              Frequency       Progress Toward Goals  OT Goals(current goals can now be found in the care plan section)  Progress towards OT goals: Progressing toward goals  Acute Rehab OT Goals Patient Stated Goal: Per wife, to walk again.   Plan Discharge plan needs to be updated    Co-evaluation                 End of Session  Activity Tolerance Patient tolerated treatment well   Patient Left in bed;with call bell/phone within reach;with chair alarm set;with family/visitor present   Nurse Communication          Time:  -     Charges: OT General Charges $OT Visit: 1 Procedure OT Treatments $Therapeutic Activity: 8-22 mins  Uriah Philipson 11/22/2015, 1:03 PM  Lesle Chris, OTR/L [phone] 11/22/2015

## 2015-11-22 NOTE — Discharge Summary (Signed)
Physician Discharge Summary   Patient ID: Derek [name] MRN: [id number] DOB/AGE: Jun 08, 1945 70 y.o.  Admit date: 11/20/2015 Discharge date: 11/22/2015  Primary Diagnosis:  Osteonecrosis of the Right hip.   Admission Diagnoses:  Past Medical History:  Diagnosis Date  . Adenomatous polyps    remote past, on 5 year surveillance  . Arthritis   . Bell's palsy 40+yrs ago  . Cancer ([place])    skin  . 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 Diverticulitis   . Enlarged prostate   . Flat affect   . Headache(784.0)    having recently  . Hypertension   . Peripheral vascular disease ([place])   . Pneumonia    Discharge Diagnoses:   Principal Problem:   Avascular necrosis of bone of right hip (HCC) Active Problems:   OA (osteoarthritis) of hip  Estimated body mass index is 29.24 kg/m as calculated from the following:   Height as of this encounter: 5' 9" (1.753 m).   Weight as of this encounter: 89.8 kg (198 lb).  Procedure(s) (LRB): RIGHT TOTAL HIP ARTHROPLASTY ANTERIOR APPROACH (Right)   Consults: None  HPI: Derek [name] is a 70 y.o. male who has advanced end-  stage osteonecrosis of their Right  hip with progressively worsening pain and  dysfunction.The patient has failed nonoperative management and presents for  total hip arthroplasty.   Laboratory Data: Admission on 11/20/2015  Component Date Value Ref Range Status  . Specimen Description 11/20/2015 FLUID RIGHT SYNOVIAL HIP   Final  . Special Requests 11/20/2015 NONE   Final  . Gram Stain 11/20/2015    Final                   Value:RARE WBC PRESENT, PREDOMINANTLY PMN NO ORGANISMS SEEN Gram Stain Report Called to,Read Back By and Verified With: OR AT 1654 ON 11/20/15 BY Freddy Finner Performed at [facility]   . Culture 11/20/2015 PENDING   Incomplete  . Report Status 11/20/2015 PENDING   Incomplete  . Specimen Description 11/21/2015 FLUID SYNOVIAL RIGHT HIP   Final  . Special Requests  11/21/2015 NONE   Final  . Gram Stain 11/21/2015    Final                   Value:RARE WBC PRESENT, PREDOMINANTLY PMN NO ORGANISMS SEEN Gram Stain Report Called to,Read Back By and Verified With: OR AT 7078 ON 11/20/15 BY S,VANHOORNE   . Culture 11/21/2015    Final                   Value:NO GROWTH < 24 HOURS Performed at [facility]   . Report Status 11/21/2015 PENDING   Incomplete  . WBC 11/21/2015 12.4* 4.0 - 10.5 K/uL Final  . RBC 11/21/2015 3.02* 4.22 - 5.81 MIL/uL Final  . Hemoglobin 11/21/2015 9.7* 13.0 - 17.0 g/dL Final  . HCT 11/21/2015 29.5* 39.0 - 52.0 % Final  . MCV 11/21/2015 97.7  78.0 - 100.0 fL Final  . MCH 11/21/2015 32.1  26.0 - 34.0 pg Final  . MCHC 11/21/2015 32.9  30.0 - 36.0 g/dL Final  . RDW 11/21/2015 17.1* 11.5 - 15.5 % Final  . Platelets 11/21/2015 343  150 - 400 K/uL Final  . Sodium 11/21/2015 134* 135 - 145 mmol/L Final  . Potassium 11/21/2015 4.5  3.5 - 5.1 mmol/L Final  . Chloride 11/21/2015 103  101 - 111 mmol/L Final  . CO2  11/21/2015 24  22 - 32 mmol/L Final  . Glucose, Bld 11/21/2015 174* 65 - 99 mg/dL Final  . BUN 11/21/2015 6  6 - 20 mg/dL Final  . Creatinine, Ser 11/21/2015 0.80  0.61 - 1.24 mg/dL Final  . Calcium 11/21/2015 8.4* 8.9 - 10.3 mg/dL Final  . GFR calc non Af Amer 11/21/2015 >60  >60 mL/min Final  . GFR calc Af Amer 11/21/2015 >60  >60 mL/min Final   Comment: (NOTE) The eGFR has been calculated using the CKD EPI equation. This calculation has not been validated in all clinical situations. eGFR's persistently <60 mL/min signify possible Chronic Kidney Disease.   . Anion gap 11/21/2015 7  5 - 15 Final  . WBC 11/22/2015 17.5* 4.0 - 10.5 K/uL Final  . RBC 11/22/2015 2.79* 4.22 - 5.81 MIL/uL Final  . Hemoglobin 11/22/2015 9.1* 13.0 - 17.0 g/dL Final  . HCT 11/22/2015 27.3* 39.0 - 52.0 % Final  . MCV 11/22/2015 97.8  78.0 - 100.0 fL Final  . MCH 11/22/2015 32.6  26.0 - 34.0 pg Final  . MCHC 11/22/2015 33.3  30.0 - 36.0 g/dL  Final  . RDW 11/22/2015 17.4* 11.5 - 15.5 % Final  . Platelets 11/22/2015 326  150 - 400 K/uL Final  . Sodium 11/22/2015 138  135 - 145 mmol/L Final  . Potassium 11/22/2015 4.1  3.5 - 5.1 mmol/L Final  . Chloride 11/22/2015 103  101 - 111 mmol/L Final  . CO2 11/22/2015 29  22 - 32 mmol/L Final  . Glucose, Bld 11/22/2015 112* 65 - 99 mg/dL Final  . BUN 11/22/2015 9  6 - 20 mg/dL Final  . Creatinine, Ser 11/22/2015 0.70  0.61 - 1.24 mg/dL Final  . Calcium 11/22/2015 8.8* 8.9 - 10.3 mg/dL Final  . GFR calc non Af Amer 11/22/2015 >60  >60 mL/min Final  . GFR calc Af Amer 11/22/2015 >60  >60 mL/min Final   Comment: (NOTE) The eGFR has been calculated using the CKD EPI equation. This calculation has not been validated in all clinical situations. eGFR's persistently <60 mL/min signify possible Chronic Kidney Disease.   Georgiann Hahn gap 11/22/2015 6  5 - 15 Final  Hospital Outpatient Visit on 11/14/2015  Component Date Value Ref Range Status  . MRSA, PCR 11/14/2015 NEGATIVE  NEGATIVE Final  . Staphylococcus aureus 11/14/2015 NEGATIVE  NEGATIVE Final   Comment:        The Xpert SA Assay (FDA approved for NASAL specimens in patients over 20 years of age), is one component of a comprehensive surveillance program.  Test performance has been validated by [facility] for patients greater than or equal to 65 year old. It is not intended to diagnose infection nor to guide or monitor treatment.   Marland Kitchen aPTT 11/14/2015 32  24 - 36 seconds Final  . WBC 11/14/2015 8.5  4.0 - 10.5 K/uL Final  . RBC 11/14/2015 3.99* 4.22 - 5.81 MIL/uL Final  . Hemoglobin 11/14/2015 12.9* 13.0 - 17.0 g/dL Final  . HCT 11/14/2015 39.3  39.0 - 52.0 % Final  . MCV 11/14/2015 98.5  78.0 - 100.0 fL Final  . MCH 11/14/2015 32.3  26.0 - 34.0 pg Final  . MCHC 11/14/2015 32.8  30.0 - 36.0 g/dL Final  . RDW 11/14/2015 16.6* 11.5 - 15.5 % Final  . Platelets 11/14/2015 418* 150 - 400 K/uL Final  . Sodium 11/14/2015 136  135 - 145  mmol/L Final  . Potassium 11/14/2015 4.5  3.5 - 5.1  mmol/L Final  . Chloride 11/14/2015 102  101 - 111 mmol/L Final  . CO2 11/14/2015 28  22 - 32 mmol/L Final  . Glucose, Bld 11/14/2015 84  65 - 99 mg/dL Final  . BUN 11/14/2015 8  6 - 20 mg/dL Final  . Creatinine, Ser 11/14/2015 0.77  0.61 - 1.24 mg/dL Final  . Calcium 11/14/2015 9.2  8.9 - 10.3 mg/dL Final  . Total Protein 11/14/2015 7.2  6.5 - 8.1 g/dL Final  . Albumin 11/14/2015 3.6  3.5 - 5.0 g/dL Final  . AST 11/14/2015 16  15 - 41 U/L Final  . ALT 11/14/2015 12* 17 - 63 U/L Final  . Alkaline Phosphatase 11/14/2015 125  38 - 126 U/L Final  . Total Bilirubin 11/14/2015 0.6  0.3 - 1.2 mg/dL Final  . GFR calc non Af Amer 11/14/2015 >60  >60 mL/min Final  . GFR calc Af Amer 11/14/2015 >60  >60 mL/min Final   Comment: (NOTE) The eGFR has been calculated using the CKD EPI equation. This calculation has not been validated in all clinical situations. eGFR's persistently <60 mL/min signify possible Chronic Kidney Disease.   . Anion gap 11/14/2015 6  5 - 15 Final  . Prothrombin Time 11/14/2015 12.7  11.4 - 15.2 seconds Final  . INR 11/14/2015 0.95   Final  . ABO/RH(D) 11/20/2015 O POS   Final  . Antibody Screen 11/20/2015 NEG   Final  . Sample Expiration 11/20/2015 11/23/2015   Final  . Extend sample reason 11/20/2015 NO TRANSFUSIONS OR PREGNANCY IN THE PAST 3 MONTHS   Final  . ABO/RH(D) 11/15/2015 O POS   Final  . Color, Urine 11/14/2015 YELLOW  YELLOW Final  . APPearance 11/14/2015 CLEAR  CLEAR Final  . Specific Gravity, Urine 11/14/2015 1.005  1.005 - 1.030 Final  . pH 11/14/2015 6.5  5.0 - 8.0 Final  . Glucose, UA 11/14/2015 NEGATIVE  NEGATIVE mg/dL Final  . Hgb urine dipstick 11/14/2015 NEGATIVE  NEGATIVE Final  . Bilirubin Urine 11/14/2015 NEGATIVE  NEGATIVE Final  . Ketones, ur 11/14/2015 NEGATIVE  NEGATIVE mg/dL Final  . Protein, ur 11/14/2015 NEGATIVE  NEGATIVE mg/dL Final  . Nitrite 11/14/2015 NEGATIVE  NEGATIVE Final  .  Leukocytes, UA 11/14/2015 NEGATIVE  NEGATIVE Final     X-Rays:Dg Pelvis Portable  Result Date: 11/20/2015 CLINICAL DATA:  Right hip arthroplasty EXAM: PORTABLE PELVIS 1-2 VIEWS COMPARISON:  MRI right hip dated 08/06/2015 FINDINGS: Right hip arthroplasty in satisfactory position. Associated surgical drain and subcutaneous gas. Left hip is within normal limits. Visualized bony pelvis is unremarkable. IMPRESSION: Right hip arthroplasty in satisfactory position. Electronically Signed   By: Julian Hy M.D.   On: 11/20/2015 18:42   Dg C-arm 61-120 Min-no Report  Result Date: 11/20/2015 CLINICAL DATA: hip C-ARM 61-120 MINUTES Fluoroscopy was utilized by the requesting physician.  No radiographic interpretation.    EKG: Orders placed or performed during the hospital encounter of 11/14/15  . EKG 12 lead  . EKG 12 lead     Hospital Course: Patient was admitted to [facility] and taken to the OR and underwent the above state procedure without complications.  Patient tolerated the procedure well and was later transferred to the recovery room and then to the orthopaedic floor for postoperative care.  They were given PO and IV analgesics for pain control following their surgery.  They were given 24 hours of postoperative antibiotics of  Anti-infectives    Start     Dose/Rate Route Frequency  Ordered Stop   11/21/15 0600  ceFAZolin (ANCEF) IVPB 2g/100 mL premix     2 g 200 mL/hr over 30 Minutes Intravenous On call to O.R. 11/20/15 1333 11/21/15 0658   11/20/15 2200  ceFAZolin (ANCEF) IVPB 2g/100 mL premix     2 g 200 mL/hr over 30 Minutes Intravenous Every 6 hours 11/20/15 1950 11/21/15 0430     and started on DVT prophylaxis in the form of Xarelto.   PT and OT were ordered for total hip protocol.  The patient was allowed to be WBAT with therapy. Discharge planning was consulted to help with postop disposition and equipment needs.  Patient had a decent night on the evening of surgery but  did have some pain.  They started to get up OOB with therapy on day one.  Hemovac drain was pulled without difficulty.  Continued to work with therapy into day two.  Dressing was changed on day two and the incision was healing well. Patient reported pain as mild and moderate. Her progressed better on day two with therapy.  Patient was seen in rounds on day two and was ready to go home.  Discharge home with home health after two sessions Diet - Cardiac diet Follow up - in 2 weeks Activity - WBAT Disposition - Home Condition Upon Discharge - home if improved D/C Meds - See DC Summary DVT Prophylaxis - Xarelto  Discharge Instructions    Call MD / Call 911    Complete by:  As directed   If you experience chest pain or shortness of breath, CALL 911 and be transported to the hospital emergency room.  If you develope a fever above 101 F, pus (white drainage) or increased drainage or redness at the wound, or calf pain, call your surgeon's office.   Change dressing    Complete by:  As directed   You may change your dressing dressing daily with sterile 4 x 4 inch gauze dressing and paper tape.  Do not submerge the incision under water.   Constipation Prevention    Complete by:  As directed   Drink plenty of fluids.  Prune juice may be helpful.  You may use a stool softener, such as Colace (over the counter) 100 mg twice a day.  Use MiraLax (over the counter) for constipation as needed.   Diet - low sodium heart healthy    Complete by:  As directed   Discharge instructions    Complete by:  As directed   Pick up stool softner and laxative for home use following surgery while on pain medications. Do not submerge incision under water. Please use good hand washing techniques while changing dressing each day. May shower starting three days after surgery. Please use a clean towel to pat the incision dry following showers. Continue to use ice for pain and swelling after surgery. Do not use any lotions or  creams on the incision until instructed by your surgeon.   Postoperative Constipation Protocol  Constipation - defined medically as fewer than three stools per week and severe constipation as less than one stool per week.  One of the most common issues patients have following surgery is constipation.  Even if you have a regular bowel pattern at home, your normal regimen is likely to be disrupted due to multiple reasons following surgery.  Combination of anesthesia, postoperative narcotics, change in appetite and fluid intake all can affect your bowels.  In order to avoid complications following surgery, here are some recommendations  in order to help you during your recovery period.  Colace (docusate) - Pick up an over-the-counter form of Colace or another stool softener and take twice a day as long as you are requiring postoperative pain medications.  Take with a full glass of water daily.  If you experience loose stools or diarrhea, hold the colace until you stool forms back up.  If your symptoms do not get better within 1 week or if they get worse, check with your doctor.  Dulcolax (bisacodyl) - Pick up over-the-counter and take as directed by the product packaging as needed to assist with the movement of your bowels.  Take with a full glass of water.  Use this product as needed if not relieved by Colace only.   MiraLax (polyethylene glycol) - Pick up over-the-counter to have on hand.  MiraLax is a solution that will increase the amount of water in your bowels to assist with bowel movements.  Take as directed and can mix with a glass of water, juice, soda, coffee, or tea.  Take if you go more than two days without a movement. Do not use MiraLax more than once per day. Call your doctor if you are still constipated or irregular after using this medication for 7 days in a row.  If you continue to have problems with postoperative constipation, please contact the office for further assistance and  recommendations.  If you experience "the worst abdominal pain ever" or develop nausea or vomiting, please contact the office immediatly for further recommendations for treatment.   Take Xarelto for two and a half more weeks, then discontinue Xarelto. Once the patient has completed the blood thinner regimen, then take a Baby 81 mg Aspirin daily for three more weeks.   Do not sit on low chairs, stoools or toilet seats, as it may be difficult to get up from low surfaces    Complete by:  As directed   Driving restrictions    Complete by:  As directed   No driving until released by the physician.   Increase activity slowly as tolerated    Complete by:  As directed   Lifting restrictions    Complete by:  As directed   No lifting until released by the physician.   Patient may shower    Complete by:  As directed   You may shower without a dressing once there is no drainage.  Do not wash over the wound.  If drainage remains, do not shower until drainage stops.   TED hose    Complete by:  As directed   Use stockings (TED hose) for 3 weeks on both leg(s).  You may remove them at night for sleeping.   Weight bearing as tolerated    Complete by:  As directed   Laterality:  right   Extremity:  Lower       Medication List    STOP taking these medications   ALEVE 220 MG tablet Generic drug:  naproxen sodium   celecoxib 200 MG capsule Commonly known as:  CELEBREX   oxyCODONE-acetaminophen 10-325 MG tablet Commonly known as:  PERCOCET   predniSONE 10 MG (21) Tbpk tablet Commonly known as:  STERAPRED UNI-PAK 21 TAB     TAKE these medications   cyclobenzaprine 10 MG tablet Commonly known as:  FLEXERIL Take 1 tablet (10 mg total) by mouth 3 (three) times daily as needed for muscle spasms.   diazepam 10 MG tablet Commonly known as:  VALIUM Take 10 mg  by mouth every 6 (six) hours as needed. Anxiety   furosemide 20 MG tablet Commonly known as:  LASIX Take 20 mg by mouth daily as needed for  fluid.   gabapentin 600 MG tablet Commonly known as:  NEURONTIN Take 600 mg by mouth 3 (three) times daily.   lactulose 10 GM/15ML solution Commonly known as:  CHRONULAC Take 15-30 mLs by mouth at bedtime. As needed   lisinopril 20 MG tablet Commonly known as:  PRINIVIL,ZESTRIL Take 10 mg by mouth daily before breakfast.   oxyCODONE 5 MG immediate release tablet Commonly known as:  Oxy IR/ROXICODONE Take 1-2 tablets (5-10 mg total) by mouth every 3 (three) hours as needed for moderate pain.   rivaroxaban 10 MG Tabs tablet Commonly known as:  XARELTO Take 1 tablet (10 mg total) by mouth daily with breakfast. Take Xarelto for two and a half more weeks, then discontinue Xarelto. Once the patient has completed the blood thinner regimen, then take a Baby 81 mg Aspirin daily for three more weeks.   traMADol 50 MG tablet Commonly known as:  ULTRAM Take 1-2 tablets (50-100 mg total) by mouth every 6 (six) hours as needed (mild to moderate).      Follow-up Information    [facility] .   Contact information: [address] [place] [address] [phone]        [name], MD. Schedule an appointment as soon as possible for a visit on 12/03/2015.   Specialty:  Orthopedic Surgery Why:  Call office at [phone] to setup appointment on Tuesday 12/03/2015 with Dr. Wynelle Link. Contact information: [address] [place] [address] [phone]           Signed: Arlee Muslim, PA-C Orthopaedic Surgery 11/22/2015, 7:43 AM

## 2015-11-22 NOTE — Progress Notes (Signed)
   Subjective: 2 Days Post-Op Procedure(s) (LRB): RIGHT TOTAL HIP ARTHROPLASTY ANTERIOR APPROACH (Right) Patient reports pain as mild and moderate.   Patient seen in rounds with Dr. Wynelle Link. Patient is having problems with pain in the hip, requiring pain medications Patient is ready to go home after two sessions of therapy if he meets his goals.  He is more alert and awake today so he should be able to participate and improve.  He was essentially WC bound for weeks prior to surgery so will keep that in mind but should improve greatly compared to his preop situation.  Objective: Vital signs in last 24 hours: Temp:  [97.8 F (36.6 C)-98.8 F (37.1 C)] 98.4 F (36.9 C) (08/04 0615) Pulse Rate:  [103-118] 117 (08/04 0615) Resp:  [16-18] 16 (08/04 0615) BP: (123-163)/(47-89) 123/47 (08/04 0615) SpO2:  [93 %-99 %] 97 % (08/04 0615)  Intake/Output from previous day:  Intake/Output Summary (Last 24 hours) at 11/22/15 0734 Last data filed at 11/22/15 0600  Gross per 24 hour  Intake             1330 ml  Output             3450 ml  Net            -2120 ml    Intake/Output this shift: No intake/output data recorded.  Labs:  Recent Labs  11/21/15 0447 11/22/15 0505  HGB 9.7* 9.1*    Recent Labs  11/21/15 0447 11/22/15 0505  WBC 12.4* 17.5*  RBC 3.02* 2.79*  HCT 29.5* 27.3*  PLT 343 326    Recent Labs  11/21/15 0447 11/22/15 0505  NA 134* 138  K 4.5 4.1  CL 103 103  CO2 24 29  BUN 6 9  CREATININE 0.80 0.70  GLUCOSE 174* 112*  CALCIUM 8.4* 8.8*   No results for input(s): LABPT, INR in the last 72 hours.  EXAM: General - Patient is Alert, Appropriate and Oriented Extremity - Neurovascular intact Sensation intact distally Dorsiflexion/Plantar flexion intact Incision - clean, dry, no drainage Motor Function - intact, moving foot and toes well on exam.   Assessment/Plan: 2 Days Post-Op Procedure(s) (LRB): RIGHT TOTAL HIP ARTHROPLASTY ANTERIOR APPROACH  (Right) Procedure(s) (LRB): RIGHT TOTAL HIP ARTHROPLASTY ANTERIOR APPROACH (Right) Past Medical History:  Diagnosis Date  . Adenomatous polyps    remote past, on 5 year surveillance  . Arthritis   . Bell's palsy 40+yrs ago  . Cancer ([place])    skin  . 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 Diverticulitis   . Enlarged prostate   . Flat affect   . Headache(784.0)    having recently  . Hypertension   . Peripheral vascular disease ([place])   . Pneumonia    Principal Problem:   Avascular necrosis of bone of right hip (HCC) Active Problems:   OA (osteoarthritis) of hip  Estimated body mass index is 29.24 kg/m as calculated from the following:   Height as of this encounter: 5\' 9"  (1.753 m).   Weight as of this encounter: 89.8 kg (198 lb). Up with therapy Discharge home with home health after two sessions if meets goals Diet - Cardiac diet Follow up - in 2 weeks Activity - WBAT Disposition - Home Condition Upon Discharge - home if improved D/C Meds - See DC Summary DVT Prophylaxis - [place], PA-C Orthopaedic Surgery 11/22/2015, 7:34 AM

## 2015-11-22 NOTE — Progress Notes (Signed)
   11/22/15 1300  PT Visit Information  Last PT Received On 11/22/15  Assistance Needed +1  History of Present Illness s/p R DA THA  Subjective Data  Patient Stated Goal Per wife, to walk again.   Precautions  Precautions Fall  Restrictions  Other Position/Activity Restrictions WBAT  Pain Assessment  Pain Assessment No/denies pain  Cognition  Arousal/Alertness Awake/alert  Behavior During Therapy WFL for tasks assessed/performed  Overall Cognitive Status Within Functional Limits for tasks assessed  Bed Mobility  Overal bed mobility Needs Assistance  Bed Mobility Sit to Supine  Sit to supine Min assist  General bed mobility comments cues for technique, assist withRLE  Transfers  Overall transfer level Needs assistance  Equipment used Rolling walker (2 wheeled)  Transfers Sit to/from Stand  Sit to Stand Min guard  General transfer comment cues for hand placement, overall safety  Ambulation/Gait  Ambulation/Gait assistance Min guard;Supervision  Ambulation Distance (Feet) 90 Feet  Assistive device Rolling walker (2 wheeled)  Gait Pattern/deviations Step-through pattern;Trunk flexed  General Gait Details cues for posture, RW safety  Gait velocity decr  Total Joint Exercises  Ankle Circles/Pumps AROM;10 reps;Both  PT - End of Session  Equipment Utilized During Treatment Gait belt  Activity Tolerance Patient tolerated treatment well  Patient left with call bell/phone within reach;in bed;with bed alarm set  PT - Assessment/Plan  PT Plan Current plan remains appropriate  PT Frequency (ACUTE ONLY) 7X/week  Follow Up Recommendations Home health PT;Supervision for mobility/OOB  PT equipment None recommended by PT  PT Goal Progression  Progress towards PT goals Progressing toward goals  Acute Rehab PT Goals  PT Goal Formulation With family  Time For Goal Achievement 11/28/15  Potential to Achieve Goals Good  PT Time Calculation  PT Start Time (ACUTE ONLY) 1304  PT Stop Time  (ACUTE ONLY) 1328  PT Time Calculation (min) (ACUTE ONLY) 24 min  PT General Charges  $$ ACUTE PT VISIT 1 Procedure  PT Treatments  $Gait Training 23-37 mins

## 2015-11-22 NOTE — Progress Notes (Signed)
Physical Therapy Treatment Patient Details Name: Derek [name] MRN: [id number] DOB: 08/04/1945 Today's Date: 28-Nov-2015    History of Present Illness s/p R DA THA    PT Comments    Pt more alert and moving well this morning, wife states she feels comfortable with D/C if he is able to move this well  Follow Up Recommendations  Home health PT;SNF (vs)     Equipment Recommendations  None recommended by PT    Recommendations for Other Services       Precautions / Restrictions Precautions Precautions: Fall Restrictions Weight Bearing Restrictions: No    Mobility  Bed Mobility Overal bed mobility: Needs Assistance Bed Mobility: Supine to Sit     Supine to sit: Min assist     General bed mobility comments: assist with trunk, incr time, cues for technique  Transfers Overall transfer level: Needs assistance Equipment used: Rolling walker (2 wheeled) Transfers: Sit to/from Stand Sit to Stand: Min guard         General transfer comment: cues for hand placement  Ambulation/Gait Ambulation/Gait assistance: Min guard Ambulation Distance (Feet): 60 Feet Assistive device: Rolling walker (2 wheeled) Gait Pattern/deviations: Step-to pattern;Step-through pattern;Decreased weight shift to right     General Gait Details: cues for posture, RW safety   Stairs            Wheelchair Mobility    Modified Rankin (Stroke Patients Only)       Balance                                    Cognition Arousal/Alertness: Awake/alert Behavior During Therapy: Flat affect                   General Comments: pt alert this am; following direct one step commands with frequent redirection to task    Exercises Total Joint Exercises Ankle Circles/Pumps: AROM;10 reps;Both Quad Sets: AROM;Both;10 reps Heel Slides: AAROM;Right;10 reps    General Comments        Pertinent Vitals/Pain Pain Assessment: 0-10 Faces Pain Scale: Hurts little more Pain  Descriptors / Indicators: Grimacing Pain Intervention(s): Monitored during session;Repositioned;Ice applied    Home Living                      Prior Function            PT Goals (current goals can now be found in the care plan section) Acute Rehab PT Goals Patient Stated Goal: Per wife, to walk again.  PT Goal Formulation: With family Time For Goal Achievement: 11/28/15 Potential to Achieve Goals: Good Progress towards PT goals: Progressing toward goals    Frequency  7X/week    PT Plan Current plan remains appropriate    Co-evaluation             End of Session Equipment Utilized During Treatment: Gait belt Activity Tolerance: Patient tolerated treatment well Patient left: in chair;with call bell/phone within reach;with chair alarm set;with family/visitor present     Time: [id number] PT Time Calculation (min) (ACUTE ONLY): 19 min  Charges:  $Gait Training: 8-22 mins                    G Codes:      Junior Huezo Nov 28, 2015, 1:01 PM

## 2015-11-23 DIAGNOSIS — Z981 Arthrodesis status: Secondary | ICD-10-CM | POA: Diagnosis not present

## 2015-11-23 DIAGNOSIS — J449 Chronic obstructive pulmonary disease, unspecified: Secondary | ICD-10-CM | POA: Diagnosis not present

## 2015-11-23 DIAGNOSIS — Z79891 Long term (current) use of opiate analgesic: Secondary | ICD-10-CM | POA: Diagnosis not present

## 2015-11-23 DIAGNOSIS — I739 Peripheral vascular disease, unspecified: Secondary | ICD-10-CM | POA: Diagnosis not present

## 2015-11-23 DIAGNOSIS — F17210 Nicotine dependence, cigarettes, uncomplicated: Secondary | ICD-10-CM | POA: Diagnosis not present

## 2015-11-23 DIAGNOSIS — M169 Osteoarthritis of hip, unspecified: Secondary | ICD-10-CM | POA: Diagnosis not present

## 2015-11-23 DIAGNOSIS — Z471 Aftercare following joint replacement surgery: Secondary | ICD-10-CM | POA: Diagnosis not present

## 2015-11-23 DIAGNOSIS — Z7901 Long term (current) use of anticoagulants: Secondary | ICD-10-CM | POA: Diagnosis not present

## 2015-11-23 DIAGNOSIS — I10 Essential (primary) hypertension: Secondary | ICD-10-CM | POA: Diagnosis not present

## 2015-11-23 DIAGNOSIS — Z96641 Presence of right artificial hip joint: Secondary | ICD-10-CM | POA: Diagnosis not present

## 2015-11-23 DIAGNOSIS — M87052 Idiopathic aseptic necrosis of left femur: Secondary | ICD-10-CM | POA: Diagnosis not present

## 2015-11-23 LAB — AEROBIC CULTURE  (SUPERFICIAL SPECIMEN)

## 2015-11-23 LAB — AEROBIC CULTURE W GRAM STAIN (SUPERFICIAL SPECIMEN): Culture: NO GROWTH

## 2015-11-25 LAB — ANAEROBIC CULTURE

## 2015-12-02 DIAGNOSIS — Z96641 Presence of right artificial hip joint: Secondary | ICD-10-CM | POA: Diagnosis not present

## 2015-12-02 DIAGNOSIS — I739 Peripheral vascular disease, unspecified: Secondary | ICD-10-CM | POA: Diagnosis not present

## 2015-12-02 DIAGNOSIS — Z471 Aftercare following joint replacement surgery: Secondary | ICD-10-CM | POA: Diagnosis not present

## 2015-12-02 DIAGNOSIS — Z7901 Long term (current) use of anticoagulants: Secondary | ICD-10-CM | POA: Diagnosis not present

## 2015-12-02 DIAGNOSIS — M87052 Idiopathic aseptic necrosis of left femur: Secondary | ICD-10-CM | POA: Diagnosis not present

## 2015-12-02 DIAGNOSIS — Z981 Arthrodesis status: Secondary | ICD-10-CM | POA: Diagnosis not present

## 2015-12-02 DIAGNOSIS — F17210 Nicotine dependence, cigarettes, uncomplicated: Secondary | ICD-10-CM | POA: Diagnosis not present

## 2015-12-02 DIAGNOSIS — I10 Essential (primary) hypertension: Secondary | ICD-10-CM | POA: Diagnosis not present

## 2015-12-02 DIAGNOSIS — Z79891 Long term (current) use of opiate analgesic: Secondary | ICD-10-CM | POA: Diagnosis not present

## 2015-12-02 DIAGNOSIS — M169 Osteoarthritis of hip, unspecified: Secondary | ICD-10-CM | POA: Diagnosis not present

## 2015-12-02 DIAGNOSIS — J449 Chronic obstructive pulmonary disease, unspecified: Secondary | ICD-10-CM | POA: Diagnosis not present

## 2015-12-09 DIAGNOSIS — I739 Peripheral vascular disease, unspecified: Secondary | ICD-10-CM | POA: Diagnosis not present

## 2015-12-09 DIAGNOSIS — J449 Chronic obstructive pulmonary disease, unspecified: Secondary | ICD-10-CM | POA: Diagnosis not present

## 2015-12-09 DIAGNOSIS — Z79891 Long term (current) use of opiate analgesic: Secondary | ICD-10-CM | POA: Diagnosis not present

## 2015-12-09 DIAGNOSIS — Z471 Aftercare following joint replacement surgery: Secondary | ICD-10-CM | POA: Diagnosis not present

## 2015-12-09 DIAGNOSIS — Z96641 Presence of right artificial hip joint: Secondary | ICD-10-CM | POA: Diagnosis not present

## 2015-12-09 DIAGNOSIS — I10 Essential (primary) hypertension: Secondary | ICD-10-CM | POA: Diagnosis not present

## 2015-12-09 DIAGNOSIS — F17210 Nicotine dependence, cigarettes, uncomplicated: Secondary | ICD-10-CM | POA: Diagnosis not present

## 2015-12-09 DIAGNOSIS — Z981 Arthrodesis status: Secondary | ICD-10-CM | POA: Diagnosis not present

## 2015-12-09 DIAGNOSIS — Z7901 Long term (current) use of anticoagulants: Secondary | ICD-10-CM | POA: Diagnosis not present

## 2015-12-09 DIAGNOSIS — M169 Osteoarthritis of hip, unspecified: Secondary | ICD-10-CM | POA: Diagnosis not present

## 2015-12-09 DIAGNOSIS — M87052 Idiopathic aseptic necrosis of left femur: Secondary | ICD-10-CM | POA: Diagnosis not present

## 2015-12-17 DIAGNOSIS — J449 Chronic obstructive pulmonary disease, unspecified: Secondary | ICD-10-CM | POA: Diagnosis not present

## 2015-12-17 DIAGNOSIS — Z96641 Presence of right artificial hip joint: Secondary | ICD-10-CM | POA: Diagnosis not present

## 2015-12-17 DIAGNOSIS — I10 Essential (primary) hypertension: Secondary | ICD-10-CM | POA: Diagnosis not present

## 2015-12-17 DIAGNOSIS — Z471 Aftercare following joint replacement surgery: Secondary | ICD-10-CM | POA: Diagnosis not present

## 2015-12-17 DIAGNOSIS — M87052 Idiopathic aseptic necrosis of left femur: Secondary | ICD-10-CM | POA: Diagnosis not present

## 2015-12-17 DIAGNOSIS — M169 Osteoarthritis of hip, unspecified: Secondary | ICD-10-CM | POA: Diagnosis not present

## 2015-12-17 DIAGNOSIS — I739 Peripheral vascular disease, unspecified: Secondary | ICD-10-CM | POA: Diagnosis not present

## 2015-12-17 DIAGNOSIS — Z79891 Long term (current) use of opiate analgesic: Secondary | ICD-10-CM | POA: Diagnosis not present

## 2015-12-17 DIAGNOSIS — Z981 Arthrodesis status: Secondary | ICD-10-CM | POA: Diagnosis not present

## 2015-12-17 DIAGNOSIS — Z7901 Long term (current) use of anticoagulants: Secondary | ICD-10-CM | POA: Diagnosis not present

## 2015-12-17 DIAGNOSIS — F17210 Nicotine dependence, cigarettes, uncomplicated: Secondary | ICD-10-CM | POA: Diagnosis not present

## 2015-12-27 DIAGNOSIS — Z96641 Presence of right artificial hip joint: Secondary | ICD-10-CM | POA: Diagnosis not present

## 2015-12-27 DIAGNOSIS — Z471 Aftercare following joint replacement surgery: Secondary | ICD-10-CM | POA: Diagnosis not present

## 2016-01-09 DIAGNOSIS — M1991 Primary osteoarthritis, unspecified site: Secondary | ICD-10-CM | POA: Diagnosis not present

## 2016-01-09 DIAGNOSIS — Z6829 Body mass index (BMI) 29.0-29.9, adult: Secondary | ICD-10-CM | POA: Diagnosis not present

## 2016-01-09 DIAGNOSIS — G894 Chronic pain syndrome: Secondary | ICD-10-CM | POA: Diagnosis not present

## 2016-01-09 DIAGNOSIS — M879 Osteonecrosis, unspecified: Secondary | ICD-10-CM | POA: Diagnosis not present

## 2016-01-09 DIAGNOSIS — I10 Essential (primary) hypertension: Secondary | ICD-10-CM | POA: Diagnosis not present

## 2016-01-09 DIAGNOSIS — E663 Overweight: Secondary | ICD-10-CM | POA: Diagnosis not present

## 2016-04-01 DIAGNOSIS — E6609 Other obesity due to excess calories: Secondary | ICD-10-CM | POA: Diagnosis not present

## 2016-04-01 DIAGNOSIS — M792 Neuralgia and neuritis, unspecified: Secondary | ICD-10-CM | POA: Diagnosis not present

## 2016-04-01 DIAGNOSIS — G894 Chronic pain syndrome: Secondary | ICD-10-CM | POA: Diagnosis not present

## 2016-04-01 DIAGNOSIS — Z6831 Body mass index (BMI) 31.0-31.9, adult: Secondary | ICD-10-CM | POA: Diagnosis not present

## 2016-04-01 DIAGNOSIS — M87859 Other osteonecrosis, unspecified femur: Secondary | ICD-10-CM | POA: Diagnosis not present

## 2016-04-01 DIAGNOSIS — M1991 Primary osteoarthritis, unspecified site: Secondary | ICD-10-CM | POA: Diagnosis not present

## 2016-04-01 DIAGNOSIS — M2550 Pain in unspecified joint: Secondary | ICD-10-CM | POA: Diagnosis not present

## 2016-04-01 DIAGNOSIS — I10 Essential (primary) hypertension: Secondary | ICD-10-CM | POA: Diagnosis not present

## 2016-04-01 DIAGNOSIS — Z1389 Encounter for screening for other disorder: Secondary | ICD-10-CM | POA: Diagnosis not present

## 2016-05-12 DIAGNOSIS — M545 Low back pain: Secondary | ICD-10-CM | POA: Diagnosis not present

## 2016-05-13 ENCOUNTER — Other Ambulatory Visit (HOSPITAL_COMMUNITY): Payer: Self-pay | Admitting: Neurosurgery

## 2016-05-13 DIAGNOSIS — M48062 Spinal stenosis, lumbar region with neurogenic claudication: Secondary | ICD-10-CM

## 2016-05-20 ENCOUNTER — Ambulatory Visit (HOSPITAL_COMMUNITY): Payer: PPO

## 2016-05-28 ENCOUNTER — Ambulatory Visit (HOSPITAL_COMMUNITY)
Admission: RE | Admit: 2016-05-28 | Discharge: 2016-05-28 | Disposition: A | Discharge status: Home / Self Care | Payer: PPO | Source: Ambulatory Visit | Attending: Neurosurgery | Admitting: Neurosurgery

## 2016-05-28 DIAGNOSIS — G9764 Postprocedural seroma of a nervous system organ or structure following other procedure: Secondary | ICD-10-CM | POA: Diagnosis not present

## 2016-05-28 DIAGNOSIS — Z981 Arthrodesis status: Secondary | ICD-10-CM | POA: Diagnosis not present

## 2016-05-28 DIAGNOSIS — Y838 Other surgical procedures as the cause of abnormal reaction of the patient, or of later complication, without mention of misadventure at the time of the procedure: Secondary | ICD-10-CM | POA: Diagnosis not present

## 2016-05-28 DIAGNOSIS — M48062 Spinal stenosis, lumbar region with neurogenic claudication: Secondary | ICD-10-CM | POA: Diagnosis not present

## 2016-05-28 DIAGNOSIS — M1288 Other specific arthropathies, not elsewhere classified, other specified site: Secondary | ICD-10-CM | POA: Diagnosis not present

## 2016-05-28 DIAGNOSIS — M5126 Other intervertebral disc displacement, lumbar region: Secondary | ICD-10-CM | POA: Diagnosis not present

## 2016-05-28 LAB — POCT I-STAT CREATININE: CREATININE: 1 mg/dL (ref 0.61–1.24)

## 2016-05-28 MED ORDER — GADOBENATE DIMEGLUMINE 529 MG/ML IV SOLN
20.0000 mL | Freq: Once | INTRAVENOUS | Status: AC | PRN
  Administered 2016-05-28: 17 mL via INTRAVENOUS

## 2016-06-05 DIAGNOSIS — Z6831 Body mass index (BMI) 31.0-31.9, adult: Secondary | ICD-10-CM | POA: Diagnosis not present

## 2016-06-05 DIAGNOSIS — M545 Low back pain: Secondary | ICD-10-CM | POA: Diagnosis not present

## 2016-06-05 DIAGNOSIS — M48062 Spinal stenosis, lumbar region with neurogenic claudication: Secondary | ICD-10-CM | POA: Diagnosis not present

## 2016-07-01 DIAGNOSIS — Z1389 Encounter for screening for other disorder: Secondary | ICD-10-CM | POA: Diagnosis not present

## 2016-07-01 DIAGNOSIS — K219 Gastro-esophageal reflux disease without esophagitis: Secondary | ICD-10-CM | POA: Diagnosis not present

## 2016-07-01 DIAGNOSIS — Z6832 Body mass index (BMI) 32.0-32.9, adult: Secondary | ICD-10-CM | POA: Diagnosis not present

## 2016-07-01 DIAGNOSIS — M1991 Primary osteoarthritis, unspecified site: Secondary | ICD-10-CM | POA: Diagnosis not present

## 2016-07-01 DIAGNOSIS — J449 Chronic obstructive pulmonary disease, unspecified: Secondary | ICD-10-CM | POA: Diagnosis not present

## 2016-07-07 ENCOUNTER — Telehealth: Payer: Self-pay | Admitting: Vascular Surgery

## 2016-07-07 NOTE — Telephone Encounter (Signed)
Opened in error

## 2016-07-15 ENCOUNTER — Encounter: Payer: Self-pay | Admitting: Family

## 2016-07-28 ENCOUNTER — Ambulatory Visit (HOSPITAL_COMMUNITY)
Admission: RE | Admit: 2016-07-28 | Discharge: 2016-07-28 | Disposition: A | Discharge status: Home / Self Care | Payer: PPO | Source: Ambulatory Visit | Attending: Family | Admitting: Family

## 2016-07-28 ENCOUNTER — Encounter: Payer: Self-pay | Admitting: Family

## 2016-07-28 ENCOUNTER — Ambulatory Visit (INDEPENDENT_AMBULATORY_CARE_PROVIDER_SITE_OTHER): Payer: PPO | Admitting: Family

## 2016-07-28 ENCOUNTER — Ambulatory Visit (INDEPENDENT_AMBULATORY_CARE_PROVIDER_SITE_OTHER)
Admission: RE | Admit: 2016-07-28 | Discharge: 2016-07-28 | Disposition: A | Discharge status: Home / Self Care | Payer: PPO | Source: Ambulatory Visit | Attending: Family | Admitting: Family

## 2016-07-28 VITALS — BP 138/82 | HR 79 | Temp 97.0°F | Resp 20 | Ht 69.0 in | Wt 213.7 lb

## 2016-07-28 DIAGNOSIS — I739 Peripheral vascular disease, unspecified: Secondary | ICD-10-CM

## 2016-07-28 DIAGNOSIS — I6523 Occlusion and stenosis of bilateral carotid arteries: Secondary | ICD-10-CM | POA: Insufficient documentation

## 2016-07-28 DIAGNOSIS — Z48812 Encounter for surgical aftercare following surgery on the circulatory system: Secondary | ICD-10-CM | POA: Diagnosis not present

## 2016-07-28 DIAGNOSIS — Z95828 Presence of other vascular implants and grafts: Secondary | ICD-10-CM

## 2016-07-28 DIAGNOSIS — F17200 Nicotine dependence, unspecified, uncomplicated: Secondary | ICD-10-CM

## 2016-07-28 DIAGNOSIS — I7409 Other arterial embolism and thrombosis of abdominal aorta: Secondary | ICD-10-CM

## 2016-07-28 DIAGNOSIS — Z4889 Encounter for other specified surgical aftercare: Secondary | ICD-10-CM

## 2016-07-28 DIAGNOSIS — I10 Essential (primary) hypertension: Secondary | ICD-10-CM | POA: Insufficient documentation

## 2016-07-28 DIAGNOSIS — R0989 Other specified symptoms and signs involving the circulatory and respiratory systems: Secondary | ICD-10-CM | POA: Diagnosis not present

## 2016-07-28 LAB — VAS US CAROTID
LCCADSYS: -125 cm/s
LCCAPDIAS: 25 cm/s
LEFT ECA DIAS: -20 cm/s
LEFT VERTEBRAL DIAS: -28 cm/s
LICADDIAS: -18 cm/s
Left CCA dist dias: -30 cm/s
Left CCA prox sys: 143 cm/s
Left ICA dist sys: -61 cm/s
Left ICA prox dias: -47 cm/s
Left ICA prox sys: -172 cm/s
RCCADSYS: -79 cm/s
RIGHT CCA MID DIAS: -22 cm/s
RIGHT ECA DIAS: -16 cm/s
RIGHT VERTEBRAL DIAS: -15 cm/s
Right CCA prox dias: 16 cm/s
Right CCA prox sys: 92 cm/s

## 2016-07-28 NOTE — Patient Instructions (Addendum)
Peripheral Vascular Disease Peripheral vascular disease (PVD) is a disease of the blood vessels that are not part of your heart and brain. A simple term for PVD is poor circulation. In most cases, PVD narrows the blood vessels that carry blood from your heart to the rest of your body. This can result in a decreased supply of blood to your arms, legs, and internal organs, like your stomach or kidneys. However, it most often affects a person's lower legs and feet. There are two types of PVD.  Organic PVD. This is the more common type. It is caused by damage to the structure of blood vessels.  Functional PVD. This is caused by conditions that make blood vessels contract and tighten (spasm). Without treatment, PVD tends to get worse over time. PVD can also lead to acute ischemic limb. This is when an arm or limb suddenly has trouble getting enough blood. This is a medical emergency. Follow these instructions at home:  Take medicines only as told by your doctor.  Do not use any tobacco products, including cigarettes, chewing tobacco, or electronic cigarettes. If you need help quitting, ask your doctor.  Lose weight if you are overweight, and maintain a healthy weight as told by your doctor.  Eat a diet that is low in fat and cholesterol. If you need help, ask your doctor.  Exercise regularly. Ask your doctor for some good activities for you.  Take good care of your feet.  Wear comfortable shoes that fit well.  Check your feet often for any cuts or sores. Contact a doctor if:  You have cramps in your legs while walking.  You have leg pain when you are at rest.  You have coldness in a leg or foot.  Your skin changes.  You are unable to get or have an erection (erectile dysfunction).  You have cuts or sores on your feet that are not healing. Get help right away if:  Your arm or leg turns cold and blue.  Your arms or legs become red, warm, swollen, painful, or numb.  You have  chest pain or trouble breathing.  You suddenly have weakness in your face, arm, or leg.  You become very confused or you cannot speak.  You suddenly have a very bad headache.  You suddenly cannot see. This information is not intended to replace advice given to you by your health care provider. Make sure you discuss any questions you have with your health care provider. Document Released: 07/01/2009 Document Revised: 09/12/2015 Document Reviewed: 09/14/2013 Elsevier Interactive Patient Education  2017 [employer].     Before your next abdominal ultrasound:  Take two Extra-Strength Gas-X capsules at bedtime the night before the test. Take another two Extra-Strength Gas-X capsules 3 hours before the test.       Stroke Prevention Some medical conditions and behaviors are associated with an increased chance of having a stroke. You may prevent a stroke by making healthy choices and managing medical conditions. How can I reduce my risk of having a stroke?  Stay physically active. Get at least 30 minutes of activity on most or all days.  Do not smoke. It may also be helpful to avoid exposure to secondhand smoke.  Limit alcohol use. Moderate alcohol use is considered to be:  No more than 2 drinks per day for men.  No more than 1 drink per day for nonpregnant women.  Eat healthy foods. This involves:  Eating 5 or more servings of fruits and vegetables  a day.  Making dietary changes that address high blood pressure (hypertension), high cholesterol, diabetes, or obesity.  Manage your cholesterol levels.  Making food choices that are high in fiber and low in saturated fat, trans fat, and cholesterol may control cholesterol levels.  Take any prescribed medicines to control cholesterol as directed by your health care provider.  Manage your diabetes.  Controlling your carbohydrate and sugar intake is recommended to manage diabetes.  Take any prescribed medicines to control  diabetes as directed by your health care provider.  Control your hypertension.  Making food choices that are low in salt (sodium), saturated fat, trans fat, and cholesterol is recommended to manage hypertension.  Ask your health care provider if you need treatment to lower your blood pressure. Take any prescribed medicines to control hypertension as directed by your health care provider.  If you are 39-11 years of age, have your blood pressure checked every 3-5 years. If you are 29 years of age or older, have your blood pressure checked every year.  Maintain a healthy weight.  Reducing calorie intake and making food choices that are low in sodium, saturated fat, trans fat, and cholesterol are recommended to manage weight.  Stop drug abuse.  Avoid taking birth control pills.  Talk to your health care provider about the risks of taking birth control pills if you are over 22 years old, smoke, get migraines, or have ever had a blood clot.  Get evaluated for sleep disorders (sleep apnea).  Talk to your health care provider about getting a sleep evaluation if you snore a lot or have excessive sleepiness.  Take medicines only as directed by your health care provider.  For some people, aspirin or blood thinners (anticoagulants) are helpful in reducing the risk of forming abnormal blood clots that can lead to stroke. If you have the irregular heart rhythm of atrial fibrillation, you should be on a blood thinner unless there is a good reason you cannot take them.  Understand all your medicine instructions.  Make sure that other conditions (such as anemia or atherosclerosis) are addressed. Get help right away if:  You have sudden weakness or numbness of the face, arm, or leg, especially on one side of the body.  Your face or eyelid droops to one side.  You have sudden confusion.  You have trouble speaking (aphasia) or understanding.  You have sudden trouble seeing in one or both  eyes.  You have sudden trouble walking.  You have dizziness.  You have a loss of balance or coordination.  You have a sudden, severe headache with no known cause.  You have new chest pain or an irregular heartbeat. Any of these symptoms may represent a serious problem that is an emergency. Do not wait to see if the symptoms will go away. Get medical help at once. Call your local emergency services (911 in [place] Do not drive yourself to the hospital. This information is not intended to replace advice given to you by your health care provider. Make sure you discuss any questions you have with your health care provider. Document Released: 05/14/2004 Document Revised: 09/12/2015 Document Reviewed: 10/07/2012 Elsevier Interactive Patient Education  2017 [employer].

## 2016-07-28 NOTE — Progress Notes (Signed)
[facility] [place] HISTORY AND PHYSICAL   MRN : [id number]  History of Present Illness:   Derek [name] is a 71 y.o. male patient of Dr. Donnetta Hutching with a history of left CIA stent x2 in June 2006.  He also has a left carotid bruit with minimal bilateral ICA stenosis on previous carotid duplex. He returns today for follow up.   He denies history of MI or stroke or TIA.  The patient also has a history with Dr. Arnoldo Morale of having lumbar fusion. He denies non-healing wounds.   He had a right total hip replacement in August 2017 and states he received immediate relief of his right hip pain. His left hip does not bother him.  His walking is yet limited by lumbar spine pain. He is supposed to have lumbar spine surgery after cardiac clearance.    Pt Diabetic: No Pt smoker: smoker (decreased to 1+ ppd, started at age 35 yrs)   Pt meds include: Statin :No, states his cholesterol is good but his triglycerides are high ASA: No, states it makes his stomach bleed Other anticoagulants/antiplatelets: no   Current Outpatient Prescriptions  Medication Sig Dispense Refill  . cyclobenzaprine (FLEXERIL) 10 MG tablet Take 1 tablet (10 mg total) by mouth 3 (three) times daily as needed for muscle spasms. 90 tablet 0  . diazepam (VALIUM) 10 MG tablet Take 10 mg by mouth every 6 (six) hours as needed. Anxiety    . furosemide (LASIX) 20 MG tablet Take 20 mg by mouth daily as needed for fluid.     Marland Kitchen gabapentin (NEURONTIN) 600 MG tablet Take 600 mg by mouth 3 (three) times daily.    Marland Kitchen lactulose (CHRONULAC) 10 GM/15ML solution Take 15-30 mLs by mouth at bedtime. As needed    . lisinopril (PRINIVIL,ZESTRIL) 20 MG tablet Take 10 mg by mouth daily before breakfast.      No current facility-administered medications for this visit.     Past Medical History:  Diagnosis Date  . Adenomatous polyps    remote past, on 5 year surveillance  . Arthritis   . Bell's palsy 40+yrs ago  .  Cancer ([place])    skin  . 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 Diverticulitis   . Enlarged prostate   . Flat affect   . Headache(784.0)    having recently  . Hypertension   . Peripheral vascular disease ([place])   . Pneumonia     Social History Social History  Substance Use Topics  . Smoking status: Current Every Day Smoker    Packs/day: 2.00    Years: 52.00    Types: Cigarettes  . Smokeless tobacco: Never Used     Comment: Up to 2 pks per day depending on stress levels.   . Alcohol use No    Family History Family History  Problem Relation Age of Onset  . Heart disease Mother     After age 68  . Heart attack Father   . Heart disease Father     Heart Disease before age 30  . Colon cancer Neg Hx     Surgical History Past Surgical History:  Procedure Laterality Date  . COLONOSCOPY  07/30/2005   Internal hemorrhoids; otherwise, normal rectum/ Left-sided diverticula  . COLONOSCOPY  01/11/2012   Procedure: COLONOSCOPY;  Surgeon: Daneil Dolin, MD;  Location: [facility];  Service: Endoscopy;  Laterality: N/A;  1:15  . ILIAC ARTERY STENT  2006  Left CIA stenting  . ROTATOR CUFF REPAIR Right    3 different surgeries  . SINUS ENDO W/FUSION Left 08/18/2013   Procedure: ENDOSCOPIC SINUS SURGERY WITH FUSION NAVIGATION;  Surgeon: Melissa Montane, MD;  Location: [facility];  Service: ENT;  Laterality: Left;  . SINUS ENDO W/FUSION Left 12/21/2013   Procedure: ENDOSCOPIC SINUS SURGERY WITH FUSION NAVIGATION;  Surgeon: Melissa Montane, MD;  Location: [facility];  Service: ENT;  Laterality: Left;  ESS with fusion left frontal sinus with Rain Stent  . SPINE SURGERY     lumbar & cervical spine surgeries X 8   . TOTAL HIP ARTHROPLASTY Right 11/20/2015   Procedure: RIGHT TOTAL HIP ARTHROPLASTY ANTERIOR APPROACH;  Surgeon: Gaynelle Arabian, MD;  Location: [facility];  Service: Orthopedics;  Laterality: Right;  Marland Kitchen VASECTOMY      Allergies  Allergen Reactions  . Prednisone     Wife  report it makes him "crazy as a bat".    Current Outpatient Prescriptions  Medication Sig Dispense Refill  . cyclobenzaprine (FLEXERIL) 10 MG tablet Take 1 tablet (10 mg total) by mouth 3 (three) times daily as needed for muscle spasms. 90 tablet 0  . diazepam (VALIUM) 10 MG tablet Take 10 mg by mouth every 6 (six) hours as needed. Anxiety    . furosemide (LASIX) 20 MG tablet Take 20 mg by mouth daily as needed for fluid.     Marland Kitchen gabapentin (NEURONTIN) 600 MG tablet Take 600 mg by mouth 3 (three) times daily.    Marland Kitchen lactulose (CHRONULAC) 10 GM/15ML solution Take 15-30 mLs by mouth at bedtime. As needed    . lisinopril (PRINIVIL,ZESTRIL) 20 MG tablet Take 10 mg by mouth daily before breakfast.      No current facility-administered medications for this visit.      REVIEW OF SYSTEMS: See HPI for pertinent positives and negatives.  Physical Examination Vitals:   07/28/16 1054 07/28/16 1058  BP: 139/81 138/82  Pulse: 79   Resp: 20   Temp: 97 F (36.1 C)   TempSrc: Oral   SpO2: 98%   Weight: 213 lb 11.2 oz (96.9 kg)   Height: 5\' 9"  (1.753 m)    Body mass index is 31.56 kg/m.  General:  A&O x 3, WDWN obese male. Gait: normal Eyes: PERRLA. Pulmonary: Respirations are non labored, CTAB, no wheezes, rales, or rhonchi. Cardiac: regular rhythm, no detected murmur.     Carotid Bruits Left Right   positive Negative  Aorta is not palpable. Radial pulses: 2+ palpable and =   VASCULAR EXAM: Extremities without ischemic changes  without Gangrene; without open wounds.     LE Pulses LEFT RIGHT   FEMORAL  palpable  palpable    POPLITEAL not palpable  not palpable   POSTERIOR TIBIAL not palpable  not palpable    DORSALIS PEDIS   ANTERIOR TIBIAL palpable  palpable    Abdomen: soft, NT, no palpable masses. Skin: no rashes, no ulcers. Musculoskeletal: no muscle wasting or atrophy. Neurologic: A&O X 3; Appropriate Affect ; SENSATION: normal; MOTOR FUNCTION: moving all extremities equally, motor strength 5/5 in arms, 4/5 in legs. Speech is fluent/normal. CN 2-12 intact     ASSESSMENT:  Derek [name] is a 71 y.o. male who is s/p left common iliac artery stent placed in 2006. His walking is limited by pain from his lumbar spine. He did receive relief of his right hips pain after total right hip replacement in August 2017.  He has a left carotid  bruit. He has no history of stroke or TIA.  His chief atherosclerotic risk factor is his 1+ ppd smoking since age 33, fortunately he does not have DM.  Pt states he had been followed for an aneurysm, none noted on left aortoiliac duplex from 07-23-15, review of records does not reveal AAA or iliac artery aneurysm noted, included right iliac with today's duplex.    DATA  Today's bilateral iliac artery duplex: Decreased visualization of the bilateral common iliac arteries due to overlying bowel gas. Elevated velocities in the right mid common iliac artery: PSV of 375 cm/s suggests a >50% stenosis. Right CIA measures 1.3 cm x 1.41 cm. Left CIA with stent measures 1.31 cm x 1.44 cm. Significant (>50%) stenosis in the distal aorta (426 cm/s). Widely patent left CIA stent without evidence of restenosis or hyperplasia. Waveforms are monophasic and turbulent due to aortic stenosis.  No significant change of the left of the left common artery and stent compared to the last exam on 07-23-15. No prior exam of the right common iliac artery.  No aneurysm of the abdominal aorta nor of the bilateral common iliac arteries noted.   ABI: Right: 0.94 (1.07, 07-23-15) waveforms: PT: biphasic, DP: triphasic; TBI: 0.69 Left: 0.91 (0.95, 07-23-15) waveforms: triphasic; TBI:  0.80 Slight decline in bilateral ABI with mild arterial occlusive disease bilaterally.   Today's Carotid duplex: No significant stenosis of the right ECA of bilateral CCA. >50% stenosis of the left ECA. Right ICA: <40% stenosis. Left ICA: 40-59% stenosis. Bilateral vertebral artery flow is antegrade.  Bilateral subclavian artery waveforms are normal.  Increase in bilateral ICA stenosis compared to the last exam on 07-23-15.   PLAN:   The patient was counseled re smoking cessation and given several free resources re smoking cessation.   Based on today's exam and non-invasive vascular lab results, the patient will follow up in 1 year with the following tests: bilateral aortoiliac duplex, ABI's, and carotid duplex.   I discussed in depth with the patient the nature of atherosclerosis, and emphasized the importance of maximal medical management including strict control of blood pressure, blood glucose, and lipid levels, obtaining regular exercise, and cessation of smoking.  The patient is aware that without maximal medical management the underlying atherosclerotic disease process will progress, limiting the benefit of any interventions.  The patient was given information about stroke prevention and what symptoms should prompt the patient to seek immediate medical care.  The patient was given information about PAD including signs, symptoms, treatment, what symptoms should prompt the patient to seek immediate medical care, and risk reduction measures to take. Thank you for allowing [place] to participate in this patient's care.  Clemon Chambers, RN, MSN, FNP-C [facility] Office: [phone]  Clinic MD: Early 07/28/2016 11:07 AM

## 2016-07-29 NOTE — Addendum Note (Signed)
Addended by: Lianne Cure A on: 07/29/2016 09:32 AM   Modules accepted: Orders

## 2016-07-30 ENCOUNTER — Ambulatory Visit (INDEPENDENT_AMBULATORY_CARE_PROVIDER_SITE_OTHER): Payer: PPO | Admitting: Cardiology

## 2016-07-30 ENCOUNTER — Encounter: Payer: Self-pay | Admitting: Cardiology

## 2016-07-30 VITALS — BP 136/82 | HR 91 | Ht 69.0 in | Wt 214.0 lb

## 2016-07-30 DIAGNOSIS — Z01810 Encounter for preprocedural cardiovascular examination: Secondary | ICD-10-CM

## 2016-07-30 DIAGNOSIS — R0602 Shortness of breath: Secondary | ICD-10-CM | POA: Diagnosis not present

## 2016-07-30 NOTE — Patient Instructions (Signed)
Medication Instructions:  Your physician recommends that you continue on your current medications as directed. Please refer to the Current Medication list given to you today.   Labwork: I will request a copy of labs from pcp   Testing/Procedures: Your physician has requested that you have a lexiscan myoview. For further information please visit [email or web address]. Please follow instruction sheet, as given.    Follow-Up: Your physician recommends that you schedule a follow-up appointment in: to be determined based on tests   Any Other Special Instructions Will Be Listed Below (If Applicable).     If you need a refill on your cardiac medications before your next appointment, please call your pharmacy.

## 2016-07-30 NOTE — Progress Notes (Addendum)
Clinical Summary Derek [name] is a 71 y.o.male seen as new patient, referred by Dr Derek [name] for the following medical problems.   1. Preoperative evaluatoin - being considered for lumbar spine surgery.  - denies any chest pain. No SOB/DOE. Sedentary lifestyle. Exertion is severely limited by back pain. Still recovering from recent hip surgery as well   2. PAD - history of prior stenting, followed by vacular - history of rectal bleeding, has not been aspirin per his report   Past Medical History:  Diagnosis Date  . Adenomatous polyps    remote past, on 5 year surveillance  . Arthritis   . Bell's palsy 40+yrs ago  . Cancer ([place])    skin  . 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 Diverticulitis   . Enlarged prostate   . Flat affect   . Headache(784.0)    having recently  . Hypertension   . Peripheral vascular disease ([place])   . Pneumonia      Allergies  Allergen Reactions  . Prednisone     Wife report it makes him "crazy as a bat".     Current Outpatient Prescriptions  Medication Sig Dispense Refill  . cyclobenzaprine (FLEXERIL) 10 MG tablet Take 1 tablet (10 mg total) by mouth 3 (three) times daily as needed for muscle spasms. 90 tablet 0  . diazepam (VALIUM) 10 MG tablet Take 10 mg by mouth every 6 (six) hours as needed. Anxiety    . furosemide (LASIX) 20 MG tablet Take 20 mg by mouth daily as needed for fluid.     Marland Kitchen gabapentin (NEURONTIN) 600 MG tablet Take 600 mg by mouth 3 (three) times daily.    Marland Kitchen lactulose (CHRONULAC) 10 GM/15ML solution Take 15-30 mLs by mouth at bedtime. As needed    . lisinopril (PRINIVIL,ZESTRIL) 20 MG tablet Take 10 mg by mouth daily before breakfast.      No current facility-administered medications for this visit.      Past Surgical History:  Procedure Laterality Date  . COLONOSCOPY  07/30/2005   Internal hemorrhoids; otherwise, normal rectum/ Left-sided diverticula  . COLONOSCOPY  01/11/2012   Procedure: COLONOSCOPY;  Surgeon: Derek Dolin, MD;  [facility];  Service: Endoscopy;  Laterality: N/A;  1:15  . ILIAC ARTERY STENT  2006   Left CIA stenting  . ROTATOR CUFF REPAIR Right    3 different surgeries  . SINUS ENDO W/FUSION Left 08/18/2013   Procedure: ENDOSCOPIC SINUS SURGERY WITH FUSION NAVIGATION;  Surgeon: Derek Montane, MD;  Location: [facility];  Service: ENT;  Laterality: Left;  . SINUS ENDO W/FUSION Left 12/21/2013   Procedure: ENDOSCOPIC SINUS SURGERY WITH FUSION NAVIGATION;  Surgeon: Derek Montane, MD;  Location: [facility];  Service: ENT;  Laterality: Left;  ESS with fusion left frontal sinus with Rain Stent  . SPINE SURGERY     lumbar & cervical spine surgeries X 8   . TOTAL HIP ARTHROPLASTY Right 11/20/2015   Procedure: RIGHT TOTAL HIP ARTHROPLASTY ANTERIOR APPROACH;  Surgeon: Derek Arabian, MD;  [facility];  Service: Orthopedics;  Laterality: Right;  Marland Kitchen VASECTOMY       Allergies  Allergen Reactions  . Prednisone     Wife report it makes him "crazy as a bat".      Family History  Problem Relation Age of Onset  . Heart disease Mother     After age 19  . Heart attack Father   . Heart  disease Father     Heart Disease before age 64  . Colon cancer Neg Hx      Social History Derek [name] reports that he has been smoking Cigarettes.  He has a 104.00 pack-year smoking history. He has never used smokeless tobacco. Mr. General reports that he does not drink alcohol.   Review of Systems CONSTITUTIONAL: No weight loss, fever, chills, weakness or fatigue.  HEENT: Eyes: No visual loss, blurred vision, double vision or yellow sclerae.No hearing loss, sneezing, congestion, runny nose or sore throat.  SKIN: No rash or itching.  CARDIOVASCULAR: per hpi RESPIRATORY: No shortness of breath, cough or sputum.  GASTROINTESTINAL: No anorexia, nausea, vomiting or diarrhea. No abdominal pain or blood.  GENITOURINARY: No burning on urination, no polyuria NEUROLOGICAL: No  headache, dizziness, syncope, paralysis, ataxia, numbness or tingling in the extremities. No change in bowel or bladder control.  MUSCULOSKELETAL: +back pain LYMPHATICS: No enlarged nodes. No history of splenectomy.  PSYCHIATRIC: No history of depression or anxiety.  ENDOCRINOLOGIC: No reports of sweating, cold or heat intolerance. No polyuria or polydipsia.  Marland Kitchen   Physical Examination Vitals:   07/30/16 1337 07/30/16 1340  BP: 138/80 136/82  Pulse: 91    Vitals:   07/30/16 1337  Weight: 214 lb (97.1 kg)  Height: 5\' 9"  (1.753 m)    Gen: resting comfortably, no acute distress HEENT: no scleral icterus, pupils equal round and reactive, no palptable cervical adenopathy,  CV: RRR, no m/r/g  No jvd Resp: Clear to auscultation bilaterally GI: abdomen is soft, non-tender, non-distended, normal bowel sounds, no hepatosplenomegaly MSK: extremities are warm, no edema.  Skin: warm, no rash Neuro:  no focal deficits Psych: appropriate affect    Assessment and Plan  1. Preoperative evaluation - being considered for intermediate risk back surgery - unable to assess exercise capacity by history, limited by severe back pain - he does have known history of PAD, increasing his risk for CAD. Can have some SOB at times.  - EKG in clinic shows SR, nonspecific ST/T changes - we will obtain a lexiscan MPI to further risk stratify.    F/u pending test results   Derek [name], M.D.  08/12/16 Addendum Normal stress test, ok to proceed with surgery from cardiac standpoing  Derek Dolly MD

## 2016-08-10 ENCOUNTER — Encounter (HOSPITAL_BASED_OUTPATIENT_CLINIC_OR_DEPARTMENT_OTHER)
Admission: RE | Admit: 2016-08-10 | Discharge: 2016-08-10 | Disposition: A | Discharge status: Home / Self Care | Payer: PPO | Source: Ambulatory Visit | Attending: Cardiology | Admitting: Cardiology

## 2016-08-10 ENCOUNTER — Encounter (HOSPITAL_COMMUNITY)
Admission: RE | Admit: 2016-08-10 | Discharge: 2016-08-10 | Disposition: A | Discharge status: Home / Self Care | Payer: PPO | Source: Ambulatory Visit | Attending: Cardiology | Admitting: Cardiology

## 2016-08-10 ENCOUNTER — Encounter (HOSPITAL_COMMUNITY): Payer: Self-pay

## 2016-08-10 DIAGNOSIS — R0602 Shortness of breath: Secondary | ICD-10-CM

## 2016-08-10 LAB — NM MYOCAR MULTI W/SPECT W/WALL MOTION / EF
CHL CUP RESTING HR STRESS: 76 {beats}/min
CSEPPHR: 100 {beats}/min
LVDIAVOL: 67 mL (ref 62–150)
LVSYSVOL: 21 mL
RATE: 0.45
SDS: 0
SRS: 4
SSS: 4
TID: 1.31

## 2016-08-10 MED ORDER — REGADENOSON 0.4 MG/5ML IV SOLN
INTRAVENOUS | Status: AC
  Administered 2016-08-10: 0.4 mg via INTRAVENOUS
  Filled 2016-08-10: qty 5

## 2016-08-10 MED ORDER — TECHNETIUM TC 99M TETROFOSMIN IV KIT
30.0000 | PACK | Freq: Once | INTRAVENOUS | Status: AC | PRN
  Administered 2016-08-10: 30.6 via INTRAVENOUS

## 2016-08-10 MED ORDER — SODIUM CHLORIDE 0.9% FLUSH
INTRAVENOUS | Status: AC
  Administered 2016-08-10: 10 mL via INTRAVENOUS
  Filled 2016-08-10: qty 10

## 2016-08-10 MED ORDER — TECHNETIUM TC 99M TETROFOSMIN IV KIT
10.0000 | PACK | Freq: Once | INTRAVENOUS | Status: AC | PRN
  Administered 2016-08-10: 10.7 via INTRAVENOUS

## 2016-08-12 ENCOUNTER — Telehealth: Payer: Self-pay

## 2016-08-12 NOTE — Telephone Encounter (Signed)
-----   Message from Arnoldo Lenis, MD sent at 08/12/2016  1:48 PM EDT ----- Normal stress test, ok to proceed with surgery. Please forwared my addended clinic note to his surgeon  Zandra Abts MD

## 2016-08-12 NOTE — Telephone Encounter (Signed)
Pt made aware, copy to pcp. Sent Dr. Arnoldo Morale @ [facility] office note.

## 2016-08-13 ENCOUNTER — Other Ambulatory Visit: Payer: Self-pay | Admitting: Neurosurgery

## 2016-09-16 ENCOUNTER — Encounter (HOSPITAL_COMMUNITY)
Admission: RE | Admit: 2016-09-16 | Discharge: 2016-09-16 | Disposition: A | Discharge status: Home / Self Care | Payer: PPO | Source: Ambulatory Visit | Attending: Neurosurgery | Admitting: Neurosurgery

## 2016-09-16 ENCOUNTER — Encounter (HOSPITAL_COMMUNITY): Payer: Self-pay

## 2016-09-16 DIAGNOSIS — Z01812 Encounter for preprocedural laboratory examination: Secondary | ICD-10-CM | POA: Insufficient documentation

## 2016-09-16 DIAGNOSIS — M48062 Spinal stenosis, lumbar region with neurogenic claudication: Secondary | ICD-10-CM | POA: Insufficient documentation

## 2016-09-16 HISTORY — DX: Dyspnea, unspecified: R06.00

## 2016-09-16 LAB — BASIC METABOLIC PANEL
ANION GAP: 8 (ref 5–15)
BUN: 7 mg/dL (ref 6–20)
CALCIUM: 8.8 mg/dL — AB (ref 8.9–10.3)
CO2: 27 mmol/L (ref 22–32)
CREATININE: 1 mg/dL (ref 0.61–1.24)
Chloride: 100 mmol/L — ABNORMAL LOW (ref 101–111)
GFR calc non Af Amer: >60 mL/min (ref >60)
Glucose, Bld: 87 mg/dL (ref 65–99)
Potassium: 3.9 mmol/L (ref 3.5–5.1)
SODIUM: 135 mmol/L (ref 135–145)

## 2016-09-16 LAB — CBC
HCT: 36.1 % — ABNORMAL LOW (ref 39.0–52.0)
HEMOGLOBIN: 11.7 g/dL — AB (ref 13.0–17.0)
MCH: 33.5 pg (ref 26.0–34.0)
MCHC: 32.4 g/dL (ref 30.0–36.0)
MCV: 103.4 fL — ABNORMAL HIGH (ref 78.0–100.0)
PLATELETS: 314 10*3/uL (ref 150–400)
RBC: 3.49 MIL/uL — AB (ref 4.22–5.81)
RDW: 15.3 % (ref 11.5–15.5)
WBC: 9 10*3/uL (ref 4.0–10.5)

## 2016-09-16 LAB — TYPE AND SCREEN
ABO/RH(D): O POS
ANTIBODY SCREEN: NEGATIVE

## 2016-09-16 LAB — SURGICAL PCR SCREEN
MRSA, PCR: NEGATIVE
Staphylococcus aureus: NEGATIVE

## 2016-09-16 NOTE — Progress Notes (Signed)
PCP: Dr. Gerarda Fraction @ [facility] in [place]  Cardiologist: Dr. Junious Silk in epic

## 2016-09-16 NOTE — Pre-Procedure Instructions (Signed)
ELGIN CARN  09/16/2016      [place], [place] [address] [place] [address] Phone: [phone] Fax: [phone]    Your procedure is scheduled on Wed. June 6  Report to [facility] Admitting at 11:10 A.M.  Call this number if you have problems the morning of surgery:  [phone]   Remember:  Do not eat food or drink liquids after midnight.  Take these medicines the morning of surgery with A SIP OF WATER : flexeril if needed,valium if needed,gabapentin,oxycodone if needed                             Stop advil, motrin, ibuprofen, aleve, ibuprofen, Goody's, BC Powders, vitamins/herbal medicines.   Do not wear jewelry.  Do not wear lotions, powders, or perfumes, or deoderant.  Do not shave 48 hours prior to surgery.  Men may shave face and neck.  Do not bring valuables to the hospital.  [facility] is not responsible for any belongings or valuables.  Contacts, dentures or bridgework may not be worn into surgery.  Leave your suitcase in the car.  After surgery it may be brought to your room.  For patients admitted to the hospital, discharge time will be determined by your treatment team.  Patients discharged the day of surgery will not be allowed to drive home.    Special instructions:  [facility]- Preparing For Surgery  Before surgery, you can play an important role. Because skin is not sterile, your skin needs to be as free of germs as possible. You can reduce the number of germs on your skin by washing with CHG (chlorahexidine gluconate) Soap before surgery.  CHG is an antiseptic cleaner which kills germs and bonds with the skin to continue killing germs even after washing.  Please do not use if you have an allergy to CHG or antibacterial soaps. If your skin becomes reddened/irritated stop using the CHG.  Do not shave (including legs and underarms) for at least 48 hours prior to first CHG  shower. It is OK to shave your face.  Please follow these instructions carefully.   1. Shower the NIGHT BEFORE SURGERY and the MORNING OF SURGERY with CHG.   2. If you chose to wash your hair, wash your hair first as usual with your normal shampoo.  3. After you shampoo, rinse your hair and body thoroughly to remove the shampoo.  4. Use CHG as you would any other liquid soap. You can apply CHG directly to the skin and wash gently with a scrungie or a clean washcloth.   5. Apply the CHG Soap to your body ONLY FROM THE NECK DOWN.  Do not use on open wounds or open sores. Avoid contact with your eyes, ears, mouth and genitals (private parts). Wash genitals (private parts) with your normal soap.  6. Wash thoroughly, paying special attention to the area where your surgery will be performed.  7. Thoroughly rinse your body with warm water from the neck down.  8. DO NOT shower/wash with your normal soap after using and rinsing off the CHG Soap.  9. Pat yourself dry with a CLEAN TOWEL.   10. Wear CLEAN PAJAMAS   11. Place CLEAN SHEETS on your bed the night of your first shower and DO NOT SLEEP WITH PETS.    Day of Surgery: Do not apply any deodorants/lotions. Please wear  clean clothes to the hospital/surgery center.      Please read over the following fact sheets that you were given. Coughing and Deep Breathing, MRSA Information and Surgical Site Infection Prevention

## 2016-09-17 NOTE — Progress Notes (Signed)
Anesthesia Chart Review:  Pt is a 71 year old male scheduled for L1-2 PLIF, exploration of lumbar fusion, instrumentation and fusion to T10 on 09/23/2016 with Newman Pies, M.D.  - PCP is Redmond School, MD - Saw cardiologist Carlyle Dolly, MD for pre-op eval 07/30/16.  Stress test ordered, low risk results below.  Pt cleared for surgery.   PMH includes:  HTN, COPD, PVD (s/p stent x2 to L CIA 2006). Current smoker. BMI 32. S/p R THA 11/20/15.   Medications include: Lasix, lisinopril  Preoperative labs reviewed.  EKG 11/14/15: NSR. LAD. LAFB.   Nuclear stress test 08/10/16:  No diagnostic ST segment changes to indicate ischemia.  No significant myocardial perfusion defects to indicate scar or focal ischemia.  This is a low risk study.  Nuclear stress EF: 69%.  Carotid duplex 08/06/14: < 40% B ICA stenoses  Iliac artery stent evaluation 07/28/16:  1.  Significant (>50%) stenosis observed in the distal aorta 2. Widely patent L common iliac artery stent without evidence of restenosis or hyperplasia. Waveforms are monophasic and turbulent due to aortic stenosis  If no changes, I anticipate pt can proceed with surgery as scheduled.   Willeen Cass, FNP-BC [facility]/Anesthesiology Phone: [phone] 09/17/2016 11:46 AM

## 2016-09-18 HISTORY — PX: BACK SURGERY: SHX140

## 2016-09-22 MED ORDER — CEFAZOLIN SODIUM-DEXTROSE 2-4 GM/100ML-% IV SOLN
2.0000 g | INTRAVENOUS | Status: AC
  Administered 2016-09-23 (×2): 2 g via INTRAVENOUS
  Filled 2016-09-22: qty 100

## 2016-09-23 ENCOUNTER — Inpatient Hospital Stay (HOSPITAL_COMMUNITY): Payer: PPO | Admitting: Emergency Medicine

## 2016-09-23 ENCOUNTER — Encounter (HOSPITAL_COMMUNITY): Payer: Self-pay | Admitting: *Deleted

## 2016-09-23 ENCOUNTER — Inpatient Hospital Stay (HOSPITAL_COMMUNITY)
Admission: RE | Disposition: A | Discharge status: Home / Self Care | Payer: Self-pay | Source: Ambulatory Visit | Attending: Neurosurgery

## 2016-09-23 ENCOUNTER — Inpatient Hospital Stay (HOSPITAL_COMMUNITY)
Admission: RE | Admit: 2016-09-23 | Discharge: 2016-09-24 | DRG: 454 | Disposition: A | Discharge status: Home / Self Care | Payer: PPO | Source: Ambulatory Visit | Attending: Neurosurgery | Admitting: Neurosurgery

## 2016-09-23 ENCOUNTER — Inpatient Hospital Stay (HOSPITAL_COMMUNITY): Payer: PPO

## 2016-09-23 ENCOUNTER — Inpatient Hospital Stay (HOSPITAL_COMMUNITY): Payer: PPO | Admitting: Anesthesiology

## 2016-09-23 DIAGNOSIS — M5116 Intervertebral disc disorders with radiculopathy, lumbar region: Principal | ICD-10-CM | POA: Diagnosis present

## 2016-09-23 DIAGNOSIS — Z96641 Presence of right artificial hip joint: Secondary | ICD-10-CM | POA: Diagnosis present

## 2016-09-23 DIAGNOSIS — Z85828 Personal history of other malignant neoplasm of skin: Secondary | ICD-10-CM | POA: Diagnosis not present

## 2016-09-23 DIAGNOSIS — Z79899 Other long term (current) drug therapy: Secondary | ICD-10-CM

## 2016-09-23 DIAGNOSIS — M48062 Spinal stenosis, lumbar region with neurogenic claudication: Secondary | ICD-10-CM | POA: Diagnosis not present

## 2016-09-23 DIAGNOSIS — I10 Essential (primary) hypertension: Secondary | ICD-10-CM | POA: Diagnosis present

## 2016-09-23 DIAGNOSIS — J439 Emphysema, unspecified: Secondary | ICD-10-CM | POA: Diagnosis present

## 2016-09-23 DIAGNOSIS — F17210 Nicotine dependence, cigarettes, uncomplicated: Secondary | ICD-10-CM | POA: Diagnosis not present

## 2016-09-23 DIAGNOSIS — M40205 Unspecified kyphosis, thoracolumbar region: Secondary | ICD-10-CM | POA: Diagnosis present

## 2016-09-23 DIAGNOSIS — J449 Chronic obstructive pulmonary disease, unspecified: Secondary | ICD-10-CM | POA: Diagnosis not present

## 2016-09-23 DIAGNOSIS — M545 Low back pain: Secondary | ICD-10-CM | POA: Diagnosis not present

## 2016-09-23 DIAGNOSIS — M4856XA Collapsed vertebra, not elsewhere classified, lumbar region, initial encounter for fracture: Secondary | ICD-10-CM | POA: Diagnosis present

## 2016-09-23 DIAGNOSIS — I739 Peripheral vascular disease, unspecified: Secondary | ICD-10-CM | POA: Diagnosis present

## 2016-09-23 DIAGNOSIS — M4326 Fusion of spine, lumbar region: Secondary | ICD-10-CM | POA: Diagnosis not present

## 2016-09-23 DIAGNOSIS — Z419 Encounter for procedure for purposes other than remedying health state, unspecified: Secondary | ICD-10-CM

## 2016-09-23 DIAGNOSIS — N400 Enlarged prostate without lower urinary tract symptoms: Secondary | ICD-10-CM | POA: Diagnosis not present

## 2016-09-23 DIAGNOSIS — S32010A Wedge compression fracture of first lumbar vertebra, initial encounter for closed fracture: Secondary | ICD-10-CM | POA: Diagnosis not present

## 2016-09-23 DIAGNOSIS — M48061 Spinal stenosis, lumbar region without neurogenic claudication: Secondary | ICD-10-CM | POA: Diagnosis not present

## 2016-09-23 SURGERY — POSTERIOR LUMBAR FUSION 1 LEVEL
Anesthesia: General | Site: Spine Lumbar

## 2016-09-23 MED ORDER — MENTHOL 3 MG MT LOZG: 1.0000 | LOZENGE | OROMUCOSAL | Status: DC | PRN

## 2016-09-23 MED ORDER — CYCLOBENZAPRINE HCL 10 MG PO TABS
10.0000 mg | ORAL_TABLET | Freq: Three times a day (TID) | ORAL | Status: DC | PRN
  Administered 2016-09-23 – 2016-09-24 (×2): 10 mg via ORAL
  Filled 2016-09-23: qty 1

## 2016-09-23 MED ORDER — SODIUM CHLORIDE 0.9 % IV SOLN
250.0000 mL | INTRAVENOUS | Status: DC
  Administered 2016-09-23: 250 mL via INTRAVENOUS

## 2016-09-23 MED ORDER — SODIUM CHLORIDE 0.9% FLUSH: 3.0000 mL | INTRAVENOUS | Status: DC | PRN

## 2016-09-23 MED ORDER — CEFAZOLIN SODIUM-DEXTROSE 2-4 GM/100ML-% IV SOLN
2.0000 g | Freq: Three times a day (TID) | INTRAVENOUS | Status: AC
  Administered 2016-09-24 (×2): 2 g via INTRAVENOUS
  Filled 2016-09-23 (×2): qty 100

## 2016-09-23 MED ORDER — FUROSEMIDE 20 MG PO TABS: 20.0000 mg | ORAL_TABLET | Freq: Every day | ORAL | Status: DC | PRN

## 2016-09-23 MED ORDER — FENTANYL CITRATE (PF) 250 MCG/5ML IJ SOLN
INTRAMUSCULAR | Status: AC
  Filled 2016-09-23: qty 5

## 2016-09-23 MED ORDER — OXYCODONE HCL 5 MG/5ML PO SOLN: 5.0000 mg | Freq: Once | ORAL | Status: AC | PRN

## 2016-09-23 MED ORDER — ARTIFICIAL TEARS OPHTHALMIC OINT
TOPICAL_OINTMENT | OPHTHALMIC | Status: DC | PRN
  Administered 2016-09-23: 1 via OPHTHALMIC

## 2016-09-23 MED ORDER — PHENYLEPHRINE HCL 10 MG/ML IJ SOLN
INTRAVENOUS | Status: DC | PRN
  Administered 2016-09-23: 25 ug/min via INTRAVENOUS

## 2016-09-23 MED ORDER — DOCUSATE SODIUM 100 MG PO CAPS
100.0000 mg | ORAL_CAPSULE | Freq: Two times a day (BID) | ORAL | Status: DC
  Administered 2016-09-24 (×2): 100 mg via ORAL
  Filled 2016-09-23 (×2): qty 1

## 2016-09-23 MED ORDER — LACTATED RINGERS IV SOLN
INTRAVENOUS | Status: DC | PRN
Start: 2016-09-23 — End: 2016-09-23
  Administered 2016-09-23 (×3): via INTRAVENOUS

## 2016-09-23 MED ORDER — DIAZEPAM 5 MG PO TABS
10.0000 mg | ORAL_TABLET | Freq: Four times a day (QID) | ORAL | Status: DC | PRN
  Administered 2016-09-24: 5 mg via ORAL
  Filled 2016-09-23: qty 2

## 2016-09-23 MED ORDER — NALOXONE HCL 0.4 MG/ML IJ SOLN
INTRAMUSCULAR | Status: AC
  Filled 2016-09-23: qty 1

## 2016-09-23 MED ORDER — BUPIVACAINE LIPOSOME 1.3 % IJ SUSP
20.0000 mL | INTRAMUSCULAR | Status: AC
  Administered 2016-09-23: 20 mL
  Filled 2016-09-23: qty 20

## 2016-09-23 MED ORDER — PROPOFOL 10 MG/ML IV BOLUS
INTRAVENOUS | Status: DC | PRN
  Administered 2016-09-23: 100 mg via INTRAVENOUS
  Administered 2016-09-23: 30 mg via INTRAVENOUS

## 2016-09-23 MED ORDER — OXYCODONE HCL 5 MG PO TABS
5.0000 mg | ORAL_TABLET | ORAL | Status: DC | PRN
  Administered 2016-09-24 (×2): 5 mg via ORAL
  Filled 2016-09-23 (×2): qty 1

## 2016-09-23 MED ORDER — ACETAMINOPHEN 650 MG RE SUPP: 650.0000 mg | RECTAL | Status: DC | PRN

## 2016-09-23 MED ORDER — SUGAMMADEX SODIUM 200 MG/2ML IV SOLN
INTRAVENOUS | Status: DC | PRN
  Administered 2016-09-23: 200 mg via INTRAVENOUS

## 2016-09-23 MED ORDER — ROCURONIUM BROMIDE 100 MG/10ML IV SOLN
INTRAVENOUS | Status: DC | PRN
  Administered 2016-09-23: 10 mg via INTRAVENOUS
  Administered 2016-09-23: 30 mg via INTRAVENOUS
  Administered 2016-09-23: 20 mg via INTRAVENOUS
  Administered 2016-09-23: 10 mg via INTRAVENOUS
  Administered 2016-09-23: 70 mg via INTRAVENOUS
  Administered 2016-09-23: 10 mg via INTRAVENOUS
  Administered 2016-09-23: 20 mg via INTRAVENOUS
  Administered 2016-09-23: 10 mg via INTRAVENOUS

## 2016-09-23 MED ORDER — PHENYLEPHRINE 40 MCG/ML (10ML) SYRINGE FOR IV PUSH (FOR BLOOD PRESSURE SUPPORT)
PREFILLED_SYRINGE | INTRAVENOUS | Status: AC
  Filled 2016-09-23: qty 10

## 2016-09-23 MED ORDER — LISINOPRIL 20 MG PO TABS
20.0000 mg | ORAL_TABLET | Freq: Every day | ORAL | Status: DC
Start: 2016-09-24 — End: 2016-09-24
  Administered 2016-09-24: 20 mg via ORAL
  Filled 2016-09-23: qty 1

## 2016-09-23 MED ORDER — ACETAMINOPHEN 325 MG PO TABS: 650.0000 mg | ORAL_TABLET | ORAL | Status: DC | PRN

## 2016-09-23 MED ORDER — ONDANSETRON HCL 4 MG PO TABS: 4.0000 mg | ORAL_TABLET | Freq: Four times a day (QID) | ORAL | Status: DC | PRN

## 2016-09-23 MED ORDER — THROMBIN 20000 UNITS EX SOLR
CUTANEOUS | Status: DC | PRN
  Administered 2016-09-23: 14:00:00 via TOPICAL

## 2016-09-23 MED ORDER — BISACODYL 10 MG RE SUPP: 10.0000 mg | Freq: Every day | RECTAL | Status: DC | PRN

## 2016-09-23 MED ORDER — FENTANYL CITRATE (PF) 100 MCG/2ML IJ SOLN
INTRAMUSCULAR | Status: AC
  Administered 2016-09-23: 50 ug via INTRAVENOUS
  Filled 2016-09-23: qty 2

## 2016-09-23 MED ORDER — BACITRACIN ZINC 500 UNIT/GM EX OINT
TOPICAL_OINTMENT | CUTANEOUS | Status: DC | PRN
  Administered 2016-09-23: 1 via TOPICAL

## 2016-09-23 MED ORDER — THROMBIN 5000 UNITS EX SOLR
CUTANEOUS | Status: AC
  Filled 2016-09-23: qty 5000

## 2016-09-23 MED ORDER — LACTATED RINGERS IV SOLN
INTRAVENOUS | Status: DC | PRN
  Administered 2016-09-23: 12:00:00 via INTRAVENOUS

## 2016-09-23 MED ORDER — CHLORHEXIDINE GLUCONATE CLOTH 2 % EX PADS: 6.0000 | MEDICATED_PAD | Freq: Once | CUTANEOUS | Status: DC

## 2016-09-23 MED ORDER — VANCOMYCIN HCL 1000 MG IV SOLR
INTRAVENOUS | Status: DC | PRN
  Administered 2016-09-23: 1000 mg via TOPICAL

## 2016-09-23 MED ORDER — ONDANSETRON HCL 4 MG/2ML IJ SOLN: 4.0000 mg | Freq: Four times a day (QID) | INTRAMUSCULAR | Status: DC | PRN

## 2016-09-23 MED ORDER — GABAPENTIN 600 MG PO TABS
600.0000 mg | ORAL_TABLET | Freq: Three times a day (TID) | ORAL | Status: DC
  Administered 2016-09-24 (×2): 600 mg via ORAL
  Filled 2016-09-23 (×2): qty 1

## 2016-09-23 MED ORDER — LACTATED RINGERS IV SOLN
INTRAVENOUS | Status: DC
  Administered 2016-09-23: 11:00:00 via INTRAVENOUS

## 2016-09-23 MED ORDER — OXYCODONE HCL 5 MG PO TABS
5.0000 mg | ORAL_TABLET | Freq: Once | ORAL | Status: AC | PRN
  Administered 2016-09-23: 5 mg via ORAL

## 2016-09-23 MED ORDER — CYCLOBENZAPRINE HCL 10 MG PO TABS
ORAL_TABLET | ORAL | Status: AC
  Administered 2016-09-23: 10 mg via ORAL
  Filled 2016-09-23: qty 1

## 2016-09-23 MED ORDER — OXYCODONE HCL 5 MG PO TABS
ORAL_TABLET | ORAL | Status: AC
  Administered 2016-09-23: 5 mg via ORAL
  Filled 2016-09-23: qty 1

## 2016-09-23 MED ORDER — CYCLOBENZAPRINE HCL 10 MG PO TABS: 10.0000 mg | ORAL_TABLET | Freq: Three times a day (TID) | ORAL | Status: DC | PRN

## 2016-09-23 MED ORDER — VANCOMYCIN HCL 1000 MG IV SOLR
INTRAVENOUS | Status: AC
  Filled 2016-09-23: qty 1000

## 2016-09-23 MED ORDER — BUPIVACAINE-EPINEPHRINE (PF) 0.5% -1:200000 IJ SOLN
INTRAMUSCULAR | Status: DC | PRN
  Administered 2016-09-23: 10 mL

## 2016-09-23 MED ORDER — OXYCODONE-ACETAMINOPHEN 10-325 MG PO TABS: 1.0000 | ORAL_TABLET | ORAL | Status: DC | PRN

## 2016-09-23 MED ORDER — GELATIN ABSORBABLE MT POWD
OROMUCOSAL | Status: DC | PRN
  Administered 2016-09-23: 14:00:00 via TOPICAL

## 2016-09-23 MED ORDER — FENTANYL CITRATE (PF) 100 MCG/2ML IJ SOLN
25.0000 ug | INTRAMUSCULAR | Status: DC | PRN
Start: 2016-09-23 — End: 2016-09-23
  Administered 2016-09-23: 50 ug via INTRAVENOUS

## 2016-09-23 MED ORDER — THROMBIN 20000 UNITS EX SOLR
CUTANEOUS | Status: AC
  Filled 2016-09-23: qty 20000

## 2016-09-23 MED ORDER — BUPIVACAINE-EPINEPHRINE (PF) 0.5% -1:200000 IJ SOLN
INTRAMUSCULAR | Status: AC
  Filled 2016-09-23: qty 30

## 2016-09-23 MED ORDER — MORPHINE SULFATE (PF) 4 MG/ML IV SOLN
4.0000 mg | INTRAVENOUS | Status: DC | PRN
Start: 2016-09-23 — End: 2016-09-24

## 2016-09-23 MED ORDER — CEFAZOLIN SODIUM 1 G IJ SOLR
INTRAMUSCULAR | Status: AC
  Filled 2016-09-23: qty 20

## 2016-09-23 MED ORDER — 0.9 % SODIUM CHLORIDE (POUR BTL) OPTIME
TOPICAL | Status: DC | PRN
  Administered 2016-09-23: 1000 mL

## 2016-09-23 MED ORDER — BACITRACIN ZINC 500 UNIT/GM EX OINT
TOPICAL_OINTMENT | CUTANEOUS | Status: AC
  Filled 2016-09-23: qty 28.35

## 2016-09-23 MED ORDER — PHENOL 1.4 % MT LIQD: 1.0000 | OROMUCOSAL | Status: DC | PRN

## 2016-09-23 MED ORDER — PHENYLEPHRINE HCL 10 MG/ML IJ SOLN
INTRAMUSCULAR | Status: DC | PRN
  Administered 2016-09-23: 80 ug via INTRAVENOUS

## 2016-09-23 MED ORDER — PROPOFOL 10 MG/ML IV BOLUS
INTRAVENOUS | Status: AC
  Filled 2016-09-23: qty 20

## 2016-09-23 MED ORDER — LACTULOSE 10 GM/15ML PO SOLN
10.0000 g | Freq: Every evening | ORAL | Status: DC | PRN
  Filled 2016-09-23: qty 30

## 2016-09-23 MED ORDER — LIDOCAINE 2% (20 MG/ML) 5 ML SYRINGE
INTRAMUSCULAR | Status: AC
  Filled 2016-09-23: qty 5

## 2016-09-23 MED ORDER — OXYCODONE-ACETAMINOPHEN 5-325 MG PO TABS
1.0000 | ORAL_TABLET | ORAL | Status: DC | PRN
  Administered 2016-09-24 (×2): 1 via ORAL
  Filled 2016-09-23 (×2): qty 1

## 2016-09-23 MED ORDER — ROCURONIUM BROMIDE 100 MG/10ML IV SOLN: INTRAVENOUS | Status: DC | PRN

## 2016-09-23 MED ORDER — KETOROLAC TROMETHAMINE 30 MG/ML IJ SOLN
INTRAMUSCULAR | Status: AC
  Filled 2016-09-23: qty 2

## 2016-09-23 MED ORDER — SODIUM CHLORIDE 0.9 % IR SOLN
Status: DC | PRN
  Administered 2016-09-23: 14:00:00

## 2016-09-23 MED ORDER — ONDANSETRON HCL 4 MG/2ML IJ SOLN
INTRAMUSCULAR | Status: DC | PRN
  Administered 2016-09-23: 4 mg via INTRAVENOUS

## 2016-09-23 MED ORDER — SODIUM CHLORIDE 0.9% FLUSH
3.0000 mL | Freq: Two times a day (BID) | INTRAVENOUS | Status: DC
  Administered 2016-09-23: 3 mL via INTRAVENOUS

## 2016-09-23 MED ORDER — ROCURONIUM BROMIDE 10 MG/ML (PF) SYRINGE
PREFILLED_SYRINGE | INTRAVENOUS | Status: AC
  Filled 2016-09-23: qty 10

## 2016-09-23 MED ORDER — MIDAZOLAM HCL 2 MG/2ML IJ SOLN
INTRAMUSCULAR | Status: AC
  Filled 2016-09-23: qty 2

## 2016-09-23 MED ORDER — FENTANYL CITRATE (PF) 100 MCG/2ML IJ SOLN
INTRAMUSCULAR | Status: DC | PRN
  Administered 2016-09-23 (×8): 50 ug via INTRAVENOUS
  Administered 2016-09-23: 100 ug via INTRAVENOUS

## 2016-09-23 SURGICAL SUPPLY — 64 items
APL SKNCLS STERI-STRIP NONHPOA (GAUZE/BANDAGES/DRESSINGS) ×1
BAG DECANTER FOR FLEXI CONT (MISCELLANEOUS) ×3 IMPLANT
BASKET BONE COLLECTION (BASKET) ×2 IMPLANT
BENZOIN TINCTURE PRP APPL 2/3 (GAUZE/BANDAGES/DRESSINGS) ×3 IMPLANT
BLADE CLIPPER SURG (BLADE) IMPLANT
BUR MATCHSTICK NEURO 3.0 LAGG (BURR) ×3 IMPLANT
BUR PRECISION FLUTE 6.0 (BURR) ×3 IMPLANT
CANISTER SUCT 3000ML PPV (MISCELLANEOUS) ×3 IMPLANT
CAP LCK SPNE (Orthopedic Implant) ×9 IMPLANT
CAP LOCK SPINE RADIUS (Orthopedic Implant) IMPLANT
CAP LOCKING (Orthopedic Implant) ×27 IMPLANT
CARTRIDGE OIL MAESTRO DRILL (MISCELLANEOUS) ×1 IMPLANT
CLOSURE WOUND 1/2 X4 (GAUZE/BANDAGES/DRESSINGS) ×1
CONT SPEC 4OZ CLIKSEAL STRL BL (MISCELLANEOUS) ×3 IMPLANT
COVER BACK TABLE 60X90IN (DRAPES) ×6 IMPLANT
DIFFUSER DRILL AIR PNEUMATIC (MISCELLANEOUS) ×3 IMPLANT
DRAPE C-ARM 42X72 X-RAY (DRAPES) ×6 IMPLANT
DRAPE HALF SHEET 40X57 (DRAPES) ×3 IMPLANT
DRAPE LAPAROTOMY 100X72X124 (DRAPES) ×3 IMPLANT
DRAPE POUCH INSTRU U-SHP 10X18 (DRAPES) ×3 IMPLANT
DRAPE SURG 17X23 STRL (DRAPES) ×12 IMPLANT
ELECT BLADE 4.0 EZ CLEAN MEGAD (MISCELLANEOUS) ×3
ELECT REM PT RETURN 9FT ADLT (ELECTROSURGICAL) ×3
ELECTRODE BLDE 4.0 EZ CLN MEGD (MISCELLANEOUS) ×1 IMPLANT
ELECTRODE REM PT RTRN 9FT ADLT (ELECTROSURGICAL) ×1 IMPLANT
EVACUATOR 1/8 PVC DRAIN (DRAIN) IMPLANT
GAUZE SPONGE 4X4 12PLY STRL (GAUZE/BANDAGES/DRESSINGS) ×3 IMPLANT
GAUZE SPONGE 4X4 16PLY XRAY LF (GAUZE/BANDAGES/DRESSINGS) ×3 IMPLANT
GLOVE BIO SURGEON STRL SZ8 (GLOVE) ×6 IMPLANT
GLOVE BIO SURGEON STRL SZ8.5 (GLOVE) ×6 IMPLANT
GOWN STRL REUS W/ TWL LRG LVL3 (GOWN DISPOSABLE) IMPLANT
GOWN STRL REUS W/ TWL XL LVL3 (GOWN DISPOSABLE) ×2 IMPLANT
GOWN STRL REUS W/TWL 2XL LVL3 (GOWN DISPOSABLE) IMPLANT
GOWN STRL REUS W/TWL LRG LVL3 (GOWN DISPOSABLE)
GOWN STRL REUS W/TWL XL LVL3 (GOWN DISPOSABLE) ×18
KIT BASIN OR (CUSTOM PROCEDURE TRAY) ×3 IMPLANT
KIT INFUSE SMALL (Orthopedic Implant) ×2 IMPLANT
KIT ROOM TURNOVER OR (KITS) ×3 IMPLANT
NDL HYPO 21X1.5 SAFETY (NEEDLE) IMPLANT
NEEDLE HYPO 21X1.5 SAFETY (NEEDLE) IMPLANT
NEEDLE HYPO 22GX1.5 SAFETY (NEEDLE) ×3 IMPLANT
NS IRRIG 1000ML POUR BTL (IV SOLUTION) ×3 IMPLANT
OIL CARTRIDGE MAESTRO DRILL (MISCELLANEOUS) ×3
PACK LAMINECTOMY NEURO (CUSTOM PROCEDURE TRAY) ×3 IMPLANT
PAD ARMBOARD 7.5X6 YLW CONV (MISCELLANEOUS) ×9 IMPLANT
PATTIES SURGICAL .5 X1 (DISPOSABLE) IMPLANT
RASP 3.0MM (RASP) ×2 IMPLANT
ROD HEX 180MM (Rod) ×4 IMPLANT
SCREW 7.75X45MM (Screw) ×16 IMPLANT
SPACER RISE 7-13MM 10X22 (Spacer) ×4 IMPLANT
SPONGE LAP 4X18 X RAY DECT (DISPOSABLE) IMPLANT
SPONGE NEURO XRAY DETECT 1X3 (DISPOSABLE) ×2 IMPLANT
SPONGE SURGIFOAM ABS GEL 100 (HEMOSTASIS) ×3 IMPLANT
STRIP BIOACTIVE 10CC 25X100X4 (Miscellaneous) ×2 IMPLANT
STRIP BIOACTIVE 20CC 25X100X8 (Miscellaneous) ×2 IMPLANT
STRIP CLOSURE SKIN 1/2X4 (GAUZE/BANDAGES/DRESSINGS) ×2 IMPLANT
SUT VIC AB 1 CT1 18XBRD ANBCTR (SUTURE) ×2 IMPLANT
SUT VIC AB 1 CT1 8-18 (SUTURE) ×9
SUT VIC AB 2-0 CP2 18 (SUTURE) ×8 IMPLANT
TAPE CLOTH SURG 4X10 WHT LF (GAUZE/BANDAGES/DRESSINGS) ×2 IMPLANT
TOWEL GREEN STERILE (TOWEL DISPOSABLE) ×3 IMPLANT
TOWEL GREEN STERILE FF (TOWEL DISPOSABLE) ×3 IMPLANT
TRAY FOLEY W/METER SILVER 16FR (SET/KITS/TRAYS/PACK) ×3 IMPLANT
WATER STERILE IRR 1000ML POUR (IV SOLUTION) ×3 IMPLANT

## 2016-09-23 NOTE — Anesthesia Procedure Notes (Signed)
Procedure Name: Intubation Date/Time: 09/23/2016 12:30 PM Performed by: Neldon Newport Pre-anesthesia Checklist: Timeout performed, Patient being monitored, Suction available, Emergency Drugs available and Patient identified Patient Re-evaluated:Patient Re-evaluated prior to inductionOxygen Delivery Method: Circle system utilized Preoxygenation: Pre-oxygenation with 100% oxygen Intubation Type: IV induction Ventilation: Mask ventilation without difficulty and Oral airway inserted - appropriate to patient size Laryngoscope Size: Mac and 4 Grade View: Grade II Tube type: Oral Tube size: 7.5 mm Number of attempts: 1 Placement Confirmation: breath sounds checked- equal and bilateral,  positive ETCO2 and ETT inserted through vocal cords under direct vision Secured at: 23 cm Tube secured with: Tape Dental Injury: Teeth and Oropharynx as per pre-operative assessment  Comments: Anterior anatomy

## 2016-09-23 NOTE — Anesthesia Preprocedure Evaluation (Addendum)
Anesthesia Evaluation  Patient identified by MRN, date of birth, ID band Patient awake    Reviewed: Allergy & Precautions, H&P , NPO status , Patient's Chart, lab work & pertinent test results  History of Anesthesia Complications Negative for: history of anesthetic complications  Airway Mallampati: II  TM Distance: >3 FB Neck ROM: full    Dental  (+) Dental Advidsory Given, Caps, Lower Dentures   Pulmonary shortness of breath and with exertion, COPD, Current Smoker,    breath sounds clear to auscultation       Cardiovascular hypertension, Pt. on medications + Peripheral Vascular Disease   Rhythm:regular     Neuro/Psych  Headaches,  Neuromuscular disease    GI/Hepatic negative GI ROS, Neg liver ROS,   Endo/Other  Morbid obesity  Renal/GU negative Renal ROS     Musculoskeletal  (+) Arthritis ,   Abdominal   Peds  Hematology negative hematology ROS (+)   Anesthesia Other Findings   Reproductive/Obstetrics                            Anesthesia Physical Anesthesia Plan  ASA: III  Anesthesia Plan: General   Post-op Pain Management:    Induction: Intravenous  PONV Risk Score and Plan: 1 and Ondansetron and Treatment may vary due to age  Airway Management Planned: Oral ETT  Additional Equipment: None  Intra-op Plan:   Post-operative Plan: Possible Post-op intubation/ventilation  Informed Consent: I have reviewed the patients History and Physical, chart, labs and discussed the procedure including the risks, benefits and alternatives for the proposed anesthesia with the patient or authorized representative who has indicated his/her understanding and acceptance.   Dental Advisory Given and Dental advisory given  Plan Discussed with: CRNA and Surgeon  Anesthesia Plan Comments:        Anesthesia Quick Evaluation

## 2016-09-23 NOTE — H&P (Signed)
Subjective: The patient is a 71 year old white male who is had several spine surgeries. Currently he has a instrumented fusion from L2 to sacrum. He has a chronic and worsening back and leg pain. He is had a hip replacement. He was worked up with a lumbar MRI which demonstrated stenosis and degenerative changes at L1-2 with a kyphotic deformity. I discussed the various treatment options with the patient and his wife including surgery. He has decided proceed with a L1-2 decompression with instrumentation and fusion up to T10.   Past Medical History:  Diagnosis Date  . Adenomatous polyps    remote past, on 5 year surveillance  . Arthritis   . Bell's palsy 40+yrs ago  . Cancer ([place])    skin  . COPD (chronic obstructive pulmonary disease) (HCC)    emphysema, pt. states he is not aware of this  . Diverticulitis   . Dyspnea    pt. denies  . Enlarged prostate   . Flat affect   . Headache(784.0)    having recently  . Hypertension   . Peripheral vascular disease ([place])   . Pneumonia     Past Surgical History:  Procedure Laterality Date  . COLONOSCOPY  07/30/2005   Internal hemorrhoids; otherwise, normal rectum/ Left-sided diverticula  . COLONOSCOPY  01/11/2012   Procedure: COLONOSCOPY;  Surgeon: Daneil Dolin, MD;  [facility];  Service: Endoscopy;  Laterality: N/A;  1:15  . ILIAC ARTERY STENT  2006   Left CIA stenting  . ROTATOR CUFF REPAIR Right    3 different surgeries  . SINUS ENDO W/FUSION Left 08/18/2013   Procedure: ENDOSCOPIC SINUS SURGERY WITH FUSION NAVIGATION;  Surgeon: Melissa Montane, MD;  Location: [facility];  Service: ENT;  Laterality: Left;  . SINUS ENDO W/FUSION Left 12/21/2013   Procedure: ENDOSCOPIC SINUS SURGERY WITH FUSION NAVIGATION;  Surgeon: Melissa Montane, MD;  Location: [facility];  Service: ENT;  Laterality: Left;  ESS with fusion left frontal sinus with Rain Stent  . SPINE SURGERY     lumbar & cervical spine surgeries X 8   . TOTAL HIP ARTHROPLASTY Right 11/20/2015   Procedure: RIGHT TOTAL HIP ARTHROPLASTY ANTERIOR APPROACH;  Surgeon: Gaynelle Arabian, MD;  [facility];  Service: Orthopedics;  Laterality: Right;  Marland Kitchen VASECTOMY      Allergies  Allergen Reactions  . Prednisone Other (See Comments)    Wife report it makes him "crazy as a bat".    Social History  Substance Use Topics  . Smoking status: Current Every Day Smoker    Packs/day: 2.00    Years: 52.00    Types: Cigarettes  . Smokeless tobacco: Never Used     Comment: Up to 2 pks per day depending on stress levels.   . Alcohol use No    Family History  Problem Relation Age of Onset  . Heart disease Mother        After age 37  . Heart attack Father   . Heart disease Father        Heart Disease before age 86  . Colon cancer Neg Hx    Prior to Admission medications   Medication Sig Start Date End Date Taking? Authorizing Provider  cyclobenzaprine (FLEXERIL) 10 MG tablet Take 1 tablet (10 mg total) by mouth 3 (three) times daily as needed for muscle spasms. 11/22/15  Yes Perkins, Alexzandrew L, PA-C  diazepam (VALIUM) 10 MG tablet Take 10 mg by mouth every 6 (six) hours as needed. Anxiety  Yes [provider]  furosemide (LASIX) 20 MG tablet Take 20 mg by mouth daily as needed for fluid.  06/29/11  Yes [provider]  gabapentin (NEURONTIN) 600 MG tablet Take 600 mg by mouth 3 (three) times daily.   Yes [provider]  lactulose (CHRONULAC) 10 GM/15ML solution Take 15-30 mLs by mouth at bedtime as needed for moderate constipation. As needed 07/24/13  Yes [provider]  lisinopril (PRINIVIL,ZESTRIL) 20 MG tablet Take 20 mg by mouth daily before breakfast.  09/07/11  Yes [provider]  naproxen sodium (ALEVE) 220 MG tablet Take 220 mg by mouth 2 (two) times daily as needed.   Yes [provider]  oxyCODONE-acetaminophen (PERCOCET) 10-325 MG tablet Take 1 tablet by mouth every 4 (four) hours as needed for pain.  07/03/16  Yes [provider]     Review of Systems  Positive ROS: As above  All other systems have been reviewed and were otherwise negative with the exception of those mentioned in the HPI and as above.  Objective: Vital signs in last 24 hours: Temp:  [98.1 F (36.7 C)] 98.1 F (36.7 C) (06/06 1138) Pulse Rate:  [83] 83 (06/06 1138) Resp:  [18] 18 (06/06 1138) BP: (130)/(59) 130/59 (06/06 1138) SpO2:  [93 %] 93 % (06/06 1138) Weight:  [99.3 kg (219 lb)] 99.3 kg (219 lb) (06/06 1138)  General Appearance: Alert Head: Normocephalic, without obvious abnormality, atraumatic Eyes: PERRL, conjunctiva/corneas clear, EOM's intact,    Ears: Normal  Throat: Normal  Neck: Supple, Back: unremarkable, the patient's lumbar incision is well-healed. Lungs: Clear to auscultation bilaterally, respirations unlabored Heart: Regular rate and rhythm, no murmur, rub or gallop Abdomen: Soft, non-tender Extremities: Extremities normal, atraumatic, no cyanosis or edema Skin: unremarkable  NEUROLOGIC:   Mental status: alert and oriented,Motor Exam - grossly normal Sensory Exam - grossly normal Reflexes:  Coordination - grossly normal Gait - grossly normal Balance - grossly normal Cranial Nerves: I: smell Not tested  II: visual acuity  OS: Normal  OD: Normal   II: visual fields Full to confrontation  II: pupils Equal, round, reactive to light  III,VII: ptosis None  III,IV,VI: extraocular muscles  Full ROM  V: mastication Normal  V: facial light touch sensation  Normal  V,VII: corneal reflex  Present  VII: facial muscle function - upper  Normal  VII: facial muscle function - lower Normal  VIII: hearing Not tested  IX: soft palate elevation  Normal  IX,X: gag reflex Present  XI: trapezius strength  5/5  XI: sternocleidomastoid strength 5/5  XI: neck flexion strength  5/5  XII: tongue strength  Normal    Data Review Lab Results  Component Value Date   WBC 9.0 09/16/2016   HGB 11.7 (L) 09/16/2016    HCT 36.1 (L) 09/16/2016   MCV 103.4 (H) 09/16/2016   PLT 314 09/16/2016   Lab Results  Component Value Date   NA 135 09/16/2016   K 3.9 09/16/2016   CL 100 (L) 09/16/2016   CO2 27 09/16/2016   BUN 7 09/16/2016   CREATININE 1.00 09/16/2016   GLUCOSE 87 09/16/2016   Lab Results  Component Value Date   INR 0.95 11/14/2015    Assessment/Plan: 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 I have reviewed his imaging studies with him and pointed out the abnormalities. We have discussed the various treatment options  including surgery. I have described the surgical treatment option of an excoriation of his fusion with an L1-2 decompression with instrumentation and fusion up to approximately T10. I have described the surgery to them. I have shown them surgical models. We have discussed the risks, benefits, alternatives, expected postoperative course, and likelihood of achieving goals of surgery. I have answered all their questions. He has decided to proceed with surgery.   Maxamillion Banas D 09/23/2016 12:13 PM

## 2016-09-23 NOTE — Progress Notes (Signed)
Subjective:  The patient is somewhat but arousable. He is in no apparent distress.  Objective: Vital signs in last 24 hours: Temp:  [97.7 F (36.5 C)-98.1 F (36.7 C)] 97.7 F (36.5 C) (06/06 1800) Pulse Rate:  [83] 83 (06/06 1138) Resp:  [18] 18 (06/06 1138) BP: (130)/(59) 130/59 (06/06 1138) SpO2:  [93 %] 93 % (06/06 1138) Weight:  [99.3 kg (219 lb)] 99.3 kg (219 lb) (06/06 1138)  Intake/Output from previous day: No intake/output data recorded. Intake/Output this shift: Total I/O In: 2900 [I.V.:2900] Out: 850 [Urine:600; Blood:250]  Physical exam the patient is, but arousable. He is moving his lower extremities well.  Lab Results: No results for input(s): WBC, HGB, HCT, PLT in the last 72 hours. BMET No results for input(s): NA, K, CL, CO2, GLUCOSE, BUN, CREATININE, CALCIUM in the last 72 hours.  Studies/Results: Dg Lumbar Spine 2-3 Views  Result Date: 09/23/2016 CLINICAL DATA:  T10 through L2 posterior fusion. EXAM: Operative LUMBAR SPINE - 2-3 VIEW 4:20 p.m.: COMPARISON:  Intraoperative localization image MRI lumbar spine 05/28/2016. Earlier today 1:47 p.m. FINDINGS: 3 spot images from the C-arm fluoroscopic device are submitted for interpretation postoperatively. These are obtained at various points during the T10 through L2 localization procedure. The final images demonstrate bilateral pedicle screws at T10, T 11, T 12, L1 and L2. Interbody fusion plugs are appropriately positioned in the disc space at L1-2. IMPRESSION: Images obtained during the T10 through L2 posterior fusion demonstrating bilateral pedicle screws at these levels and appropriately positioned interbody fusion plugs at L1-2. Electronically Signed   By: Evangeline Dakin M.D.   On: 09/23/2016 16:52   Dg Lumbar Spine 1 View  Result Date: 09/23/2016 CLINICAL DATA:  Posterior lumbar interbody fusion at L1-2. EXAM: LUMBAR SPINE - 1 VIEW COMPARISON:  MRI of May 28, 2016. FINDINGS: Single intraoperative  cross-table lateral projection of the lumbar spine was obtained. Surgical probe is seen at the level of L1-2. IMPRESSION: Surgical localization as described above. Electronically Signed   By: Marijo Conception, M.D.   On: 09/23/2016 14:02   Dg C-arm 61-120 Min  Result Date: 09/23/2016 CLINICAL DATA:  Back pain. EXAM: DG C-ARM 61-120 MIN COMPARISON:  Intraoperative Lumbar spine radiographs 09/23/2016. FINDINGS: See lumbar spine radiograph dictated separately. IMPRESSION: C-arm radiograph documents posterior lumbar fusion. Electronically Signed   By: Staci Righter M.D.   On: 09/23/2016 16:45    Assessment/Plan: The patient is doing well.  LOS: 0 days     Langston Tuberville D 09/23/2016, 6:11 PM

## 2016-09-23 NOTE — Op Note (Signed)
Brief history: The patient is a 71 year old white male who's had several prior back surgeries. He is fused from L2 to the sacrum. He has developed recurrent back and leg pain consistent with neurogenic claudication. He failed medical management was worked up with a lumbar MRI and lumbar x-rays. 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 I discussed treatment options with the patient and his wife. He has decided proceed with surgery after weighing the risks, benefits, and alternatives.  Preoperative diagnosis: L1 - 2 Degenerative disc disease, spinal stenosis compressing both the L1 and the L2 nerve roots; lumbago; lumbar radiculopathy; L1 compression fracture, thoraco lumbar kyphosis  Postoperative diagnosis: The same  Procedure: Exploration of lumbar fusion; bilateral L1-2 Laminotomy/foraminotomies to decompress the bilateral L1 and L2 nerve roots(the work required to do this was in addition to the work required to do the posterior lumbar interbody fusion because of the patient's spinal stenosis, facet arthropathy. Etc. requiring a wide decompression of the nerve roots.); L1-2 posterior lumbar interbody fusion with local morselized autograft bone and Kinnex graft extender; insertion of interbody prosthesis at L1-2 (globus peek expandable interbody prosthesis); posterior segmental instrumentation from T10 to L3 with globus titanium pedicle screws and rods; posterior lateral arthrodesis at T10-11, T11-12, T12-L1, L1-2 with bone morphogenic protein-soaked collagen sponges, local morselized autograft bone and Kinnex bone graft extender.  Surgeon: Dr. Earle Gell  Asst.: Dr. Sherley Bounds  Anesthesia: Gen. endotracheal  Estimated blood loss: 300 mL  Drains: None  Complications: None  Description of procedure: The patient was brought to the operating room by the anesthesia team. General endotracheal anesthesia  was induced. The patient was turned to the prone position on the Wilson frame. The patient's lumbosacral region was then prepared with Betadine scrub and Betadine solution. Sterile drapes were applied.  I then injected the area to be incised with Marcaine with epinephrine solution. I then used the scalpel to make a linear midline incision over the T10-11 to L2-3 interspace. I then used electrocautery to perform a bilateral subperiosteal dissection exposing the spinous process and lamina of T10-L3, exposing the old hardware. We then obtained intraoperative radiograph to confirm our location. We then inserted the Verstrac retractor to provide exposure.  We began the exploration of the fusion by removing the cross connector between the rods, and cutting the rod just below the L3 pedicle screws. We inspected the arthrodesis at L2-3. It appeared solid.  I began the decompression by using the high speed drill to perform laminotomies at L1-2 bilaterally. We then used the Kerrison punches to widen the laminotomy and removed the ligamentum flavum at L1-2 bilaterally. We used the Kerrison punches to remove the medial facets at L1-2 bilaterally. We performed wide foraminotomies about the bilateral L1 and L2 nerve roots completing the decompression.  We now turned our attention to the posterior lumbar interbody fusion. I used a scalpel to incise the intervertebral disc at L1-2 bilaterally. I then performed a partial intervertebral discectomy at L1-2 bilaterally using the pituitary forceps. We prepared the vertebral endplates at K3-5 bilaterally for the fusion by removing the soft tissues with the curettes. We then used the trial spacers to pick the appropriate sized interbody prosthesis. We prefilled his prosthesis with a combination of local morselized autograft bone that we obtained during the decompression as well as Kinnex bone graft extender. We inserted the prefilled prosthesis into the interspace at L1-2  bilaterally, lateral to the thecal sac. We  then expanded the prosthesis. There was a good snug fit of the prosthesis in the interspace. We then filled and the remainder of the intervertebral disc space with local morselized autograft bone and Kinnex. This completed the posterior lumbar interbody arthrodesis.  We now turned attention to the instrumentation. Under fluoroscopic guidance we cannulated the bilateral T10, T11, T12, L1 pedicles with the bone probe. We then removed the bone probe. We then tapped the pedicle with a 6.25 millimeter tap. We then removed the tap. We probed inside the tapped pedicle with a ball probe to rule out cortical breaches. We then inserted a 7.75 x 45 millimeter pedicle screw into the T10, T11, T12, and L1 pedicles bilaterally under fluoroscopic guidance. We then palpated along the medial aspect of the pedicles to rule out cortical breaches. There were none. The nerve roots were not injured. We then connected the unilateral pedicle screws with a lordotic rod from T 10 to L3. We compressed the construct and secured the rod in place with the caps. We then tightened the caps appropriately. This completed the instrumentation from T10-L3.  We now turned our attention to the posterior lateral arthrodesis at T10-11, T11-12, T12-L1, and L1-2. We used the high-speed drill to decorticate the remainder of the facets, pars, transverse process at T10-11, T11-12, T12-L1, and L1-2. We then applied a combination of local morselized autograft bone and Kinnex bone graft extender over these decorticated posterior lateral structures. This completed the posterior lateral arthrodesis.  We then obtained hemostasis using bipolar electrocautery. We irrigated the wound out with bacitracin solution. We inspected the thecal sac and nerve roots and noted they were well decompressed. We then removed the retractor. We placed vancomycin powder in the wound. We reapproximated patient's thoracolumbar fascia with  interrupted #1 Vicryl suture. We reapproximated patient's subcutaneous tissue with interrupted 2-0 Vicryl suture. The reapproximated patient's skin with Steri-Strips and benzoin. The wound was then coated with bacitracin ointment. A sterile dressing was applied. The drapes were removed. The patient was subsequently returned to the supine position where they were extubated by the anesthesia team. He was then transported to the post anesthesia care unit in stable condition. All sponge instrument and needle counts were reportedly correct at the end of this case.

## 2016-09-23 NOTE — Transfer of Care (Signed)
Immediate Anesthesia Transfer of Care Note  Patient: Derek [name]  Procedure(s) Performed: Procedure(s) with comments: LUMBAR ONE-TWO POSTERIOR LUMBAR INTERBODY FUSION WITH EXTENSION OF EXISTING FUSIO (N/A) - POSTERIOR LUMBAR INTERBODY FUSION LUMBAR 1- LUMBAR 2, EXPLORATION OF LUMBAR FUSION, INSTRUMENTATION AND FUSION TO [place] 10  Patient Location: PACU  Anesthesia Type:General  Level of Consciousness: patient cooperative, lethargic and responds to stimulation  Airway & Oxygen Therapy: Patient Spontanous Breathing and Patient connected to face mask oxygen  Post-op Assessment: Report given to RN and Patient moving all extremities X 4  Post vital signs: Reviewed and stable  Last Vitals:  Vitals:   09/23/16 1138  BP: (!) 130/59  Pulse: 83  Resp: 18  Temp: 36.7 C    Last Pain:  Vitals:   09/23/16 1138  TempSrc: Oral         Complications: No apparent anesthesia complications

## 2016-09-23 NOTE — Progress Notes (Signed)
Orthopedic Tech Progress Note Patient Details:  Derek [name] [birth year] [id number] Patient has brace Patient ID: Derek [name], male   DOB: Jul 23, 1945, 71 y.o.   MRN: [id number]   Derek [name] 09/23/2016, 8:42 PM

## 2016-09-24 LAB — CBC
HEMATOCRIT: 31.6 % — AB (ref 39.0–52.0)
Hemoglobin: 10 g/dL — ABNORMAL LOW (ref 13.0–17.0)
MCH: 32.9 pg (ref 26.0–34.0)
MCHC: 31.6 g/dL (ref 30.0–36.0)
MCV: 103.9 fL — AB (ref 78.0–100.0)
Platelets: 285 10*3/uL (ref 150–400)
RBC: 3.04 MIL/uL — AB (ref 4.22–5.81)
RDW: 15.1 % (ref 11.5–15.5)
WBC: 12.2 10*3/uL — ABNORMAL HIGH (ref 4.0–10.5)

## 2016-09-24 LAB — BASIC METABOLIC PANEL
ANION GAP: 11 (ref 5–15)
BUN: 6 mg/dL (ref 6–20)
CALCIUM: 8.1 mg/dL — AB (ref 8.9–10.3)
CO2: 27 mmol/L (ref 22–32)
Chloride: 98 mmol/L — ABNORMAL LOW (ref 101–111)
Creatinine, Ser: 0.98 mg/dL (ref 0.61–1.24)
GFR calc non Af Amer: >60 mL/min (ref >60)
Glucose, Bld: 118 mg/dL — ABNORMAL HIGH (ref 65–99)
POTASSIUM: 4 mmol/L (ref 3.5–5.1)
Sodium: 136 mmol/L (ref 135–145)

## 2016-09-24 MED ORDER — OXYCODONE HCL 10 MG PO TABS: 10.0000 mg | ORAL_TABLET | ORAL | 0 refills | Status: DC | PRN

## 2016-09-24 MED ORDER — OXYCODONE HCL 5 MG PO TABS
10.0000 mg | ORAL_TABLET | ORAL | Status: DC | PRN
  Administered 2016-09-24: 10 mg via ORAL
  Filled 2016-09-24: qty 2

## 2016-09-24 MED ORDER — DOCUSATE SODIUM 100 MG PO CAPS: 100.0000 mg | ORAL_CAPSULE | Freq: Two times a day (BID) | ORAL | 0 refills | Status: DC

## 2016-09-24 NOTE — Anesthesia Postprocedure Evaluation (Signed)
Anesthesia Post Note  Patient: Derek [name]  Procedure(s) Performed: Procedure(s) (LRB): LUMBAR ONE-TWO POSTERIOR LUMBAR INTERBODY FUSION WITH EXTENSION OF EXISTING FUSIO (N/A)     Patient location during evaluation: PACU Anesthesia Type: General Level of consciousness: awake and oriented Pain management: pain level controlled Vital Signs Assessment: post-procedure vital signs reviewed and stable Respiratory status: spontaneous breathing and nonlabored ventilation Cardiovascular status: blood pressure returned to baseline Anesthetic complications: no    Last Vitals:  Vitals:   09/24/16 0400 09/24/16 0758  BP: 138/68 (!) 160/71  Pulse: (!) 109 (!) 110  Resp: 18 16  Temp: 37.4 C 37.2 C    Last Pain:  Vitals:   09/24/16 0613  TempSrc:   PainSc: 7                  Esvin Hnat COKER

## 2016-09-24 NOTE — Progress Notes (Signed)
Patient is discharged from room 3C08 at this time. Alert and in stable condition. IV site d/c'd and instructions read to patient and wife with understanding verbalized. Left unit via wheelchair with all belongings at side.

## 2016-09-24 NOTE — Evaluation (Signed)
Physical Therapy Evaluation Patient Details Name: Derek [name] MRN: [id number] DOB: 04/24/1945 Today's Date: 09/24/2016   History of Present Illness  Pt is a 71 y/o male with a PMH significant for L2-S1 fusion. He is now s/p L1-L2 PLIF on 09/23/16.  Clinical Impression  Pt admitted with above diagnosis. Pt currently with functional limitations due to the deficits listed below (see PT Problem List). At the time of PT eval pt was lethargic/sleepy with difficulty keeping eyes open at times. Speech was difficult to understand at times due to slurring of words. Pt with several prior back surgeries and pt/wife report understanding of precautions and mobility. Wife was educated on brace application/adjustment. Pt will benefit from skilled PT to increase their independence and safety with mobility to allow discharge to the venue listed below.       Follow Up Recommendations No PT follow up;Supervision for mobility/OOB    Equipment Recommendations  None recommended by PT    Recommendations for Other Services       Precautions / Restrictions Precautions Precautions: Fall;Back Precaution Booklet Issued: No Precaution Comments: Reviewed handout with pt and wife. Many previous back surgeries and wife is very familiar with precautions and how to assist husband. Pt not very engaged in session - education primarily to wife.  Required Braces or Orthoses: Spinal Brace Spinal Brace: Thoracolumbosacral orthotic;Applied in sitting position Restrictions Weight Bearing Restrictions: No      Mobility  Bed Mobility               General bed mobility comments: Pt received standing in room with nursing staff.  Transfers Overall transfer level: Needs assistance Equipment used: Rolling walker (2 wheeled) Transfers: Sit to/from Stand Sit to Stand: Min guard         General transfer comment: VC's for hand placement on seated surface for safety.   Ambulation/Gait Ambulation/Gait assistance: Min  assist Ambulation Distance (Feet): 100 Feet Assistive device: Rolling walker (2 wheeled) Gait Pattern/deviations: Step-through pattern;Decreased stride length;Trunk flexed Gait velocity: Decreased Gait velocity interpretation: Below normal speed for age/gender General Gait Details: Generally flexed. Pt was able to ambulate with min assist for balance support and safety. Occasional assist required for closer walker proximity.  Stairs            Wheelchair Mobility    Modified Rankin (Stroke Patients Only)       Balance                                             Pertinent Vitals/Pain Pain Assessment: Faces Faces Pain Scale: Hurts little more Pain Location: Incision site Pain Descriptors / Indicators: Operative site guarding Pain Intervention(s): Limited activity within patient's tolerance;Monitored during session;Repositioned    Home Living Family/patient expects to be discharged to:: Private residence Living Arrangements: Spouse/significant other Available Help at Discharge: Family;Available 24 hours/day Type of Home: House Home Access: Level entry     Home Layout: One level Home Equipment: Walker - 2 wheels;Wheelchair - manual;Tub bench      Prior Function Level of Independence: Needs assistance         Comments: Wife assists with ADL's     Hand Dominance        Extremity/Trunk Assessment   Upper Extremity Assessment Upper Extremity Assessment: Generalized weakness    Lower Extremity Assessment Lower Extremity Assessment: Generalized weakness    Cervical / Trunk  Assessment Cervical / Trunk Assessment: Other exceptions Cervical / Trunk Exceptions: Generally flexed with forward head/rounded shoulder posture.   Communication   Communication:  (Difficult to understand during session - likely 2 meds)  Cognition Arousal/Alertness: Lethargic;Suspect due to medications Behavior During Therapy: [facility] for tasks  assessed/performed Overall Cognitive Status: Within Functional Limits for tasks assessed                                        General Comments      Exercises     Assessment/Plan    PT Assessment Patient needs continued PT services  PT Problem List Decreased strength;Decreased range of motion;Decreased activity tolerance;Decreased balance;Decreased mobility;Decreased knowledge of use of DME;Decreased safety awareness;Decreased knowledge of precautions;Pain       PT Treatment Interventions DME instruction;Gait training;Stair training;Functional mobility training;Therapeutic activities;Therapeutic exercise;Neuromuscular re-education;Patient/family education    PT Goals (Current goals can be found in the Care Plan section)  Acute Rehab PT Goals Patient Stated Goal: Pt did not state goals.  PT Goal Formulation: With patient/family Time For Goal Achievement: 10/01/16 Potential to Achieve Goals: Good    Frequency Min 5X/week   Barriers to discharge        Co-evaluation               AM-PAC PT "6 Clicks" Daily Activity  Outcome Measure Difficulty turning over in bed (including adjusting bedclothes, sheets and blankets)?: Total Difficulty moving from lying on back to sitting on the side of the bed? : Total Difficulty sitting down on and standing up from a chair with arms (e.g., wheelchair, bedside commode, etc,.)?: Total Help needed moving to and from a bed to chair (including a wheelchair)?: A Little Help needed walking in hospital room?: A Little Help needed climbing 3-5 steps with a railing? : A Lot 6 Click Score: 11    End of Session Equipment Utilized During Treatment: Gait belt;Back brace Activity Tolerance: Patient limited by lethargy Patient left: in chair;with call bell/phone within reach;with family/visitor present Nurse Communication: Mobility status PT Visit Diagnosis: Unsteadiness on feet (R26.81);Pain;Other symptoms and signs involving  the nervous system (R29.898) Pain - part of body:  (Incision site)    Time: 6553-7482 PT Time Calculation (min) (ACUTE ONLY): 23 min   Charges:   PT Evaluation $PT Eval Moderate Complexity: 1 Procedure PT Treatments $Gait Training: 8-22 mins   PT G Codes:        Rolinda Roan, PT, DPT Acute Rehabilitation Services Pager: [phone]   Thelma Comp 09/24/2016, 9:41 AM

## 2016-09-24 NOTE — Discharge Summary (Signed)
Physician Discharge Summary  Patient ID: Derek [name] MRN: [id number] DOB/AGE: 70-Oct-1947 70 y.o.  Admit date: 09/23/2016 Discharge date: 09/24/2016  Admission Diagnoses:L1 fracture, L1-2 degenerative disc disease, spinal stenosis, neurogenic claudication, thoracolumbar kyphosis, lumbago  Discharge Diagnoses: The same Active Problems:   Lumbar stenosis with neurogenic claudication   Discharged Condition: good  Hospital Course: I performed a thoracolumbar decompression, agitation, and fusion on the patient on 09/23/2016. The surgery went well.  The patient's postoperative course was unremarkable. On postoperative day #1 he requested discharge to home. The plan is to make sure he can urinate and if so discharge him later today. The patient, and his wife, were given written and oral discharge instructions. All their questions were answered.  Consults: Physical therapy Significant Diagnostic Studies: None Treatments: L1-2 posterior lumbar interbody fusion, interbody prosthesis; T10-11, T11-12, T12-L1, L1-2 posterior lateral arthrodesis; T10-L3 posterior instrumentation Discharge Exam: Blood pressure (!) 160/71, pulse (!) 110, temperature 98.9 F (37.2 C), resp. rate 16, height 5\' 9"  (1.753 m), weight 99.3 kg (219 lb), SpO2 94 %. The patient is alert and pleasant. He looks well. His strength is normal in his lower extremities.  Disposition: Home   Allergies as of 09/24/2016      Reactions   Prednisone Other (See Comments)   Wife report it makes him "crazy as a bat".      Medication List    STOP taking these medications   ALEVE 220 MG tablet Generic drug:  naproxen sodium   cyclobenzaprine 10 MG tablet Commonly known as:  FLEXERIL   oxyCODONE-acetaminophen 10-325 MG tablet Commonly known as:  PERCOCET     TAKE these medications   diazepam 10 MG tablet Commonly known as:  VALIUM Take 10 mg by mouth every 6 (six) hours as needed. Anxiety   docusate sodium 100 MG  capsule Commonly known as:  COLACE Take 1 capsule (100 mg total) by mouth 2 (two) times daily.   furosemide 20 MG tablet Commonly known as:  LASIX Take 20 mg by mouth daily as needed for fluid.   gabapentin 600 MG tablet Commonly known as:  NEURONTIN Take 600 mg by mouth 3 (three) times daily.   lactulose 10 GM/15ML solution Commonly known as:  CHRONULAC Take 15-30 mLs by mouth at bedtime as needed for moderate constipation. As needed   lisinopril 20 MG tablet Commonly known as:  PRINIVIL,ZESTRIL Take 20 mg by mouth daily before breakfast.   Oxycodone HCl 10 MG Tabs Take 1 tablet (10 mg total) by mouth every 4 (four) hours as needed for moderate pain.        SignedOphelia Charter 09/24/2016, 9:54 AM

## 2016-09-28 MED FILL — Heparin Sodium (Porcine) Inj 1000 Unit/ML: INTRAMUSCULAR | Qty: 30 | Status: AC

## 2016-09-28 MED FILL — Sodium Chloride IV Soln 0.9%: INTRAVENOUS | Qty: 1000 | Status: AC

## 2016-10-16 DIAGNOSIS — M48062 Spinal stenosis, lumbar region with neurogenic claudication: Secondary | ICD-10-CM | POA: Diagnosis not present

## 2016-11-18 DIAGNOSIS — E663 Overweight: Secondary | ICD-10-CM | POA: Diagnosis not present

## 2016-11-18 DIAGNOSIS — A419 Sepsis, unspecified organism: Secondary | ICD-10-CM

## 2016-11-18 DIAGNOSIS — Z6829 Body mass index (BMI) 29.0-29.9, adult: Secondary | ICD-10-CM | POA: Diagnosis not present

## 2016-11-18 DIAGNOSIS — Z981 Arthrodesis status: Secondary | ICD-10-CM | POA: Diagnosis not present

## 2016-11-18 DIAGNOSIS — Z1389 Encounter for screening for other disorder: Secondary | ICD-10-CM | POA: Diagnosis not present

## 2016-11-18 DIAGNOSIS — M5136 Other intervertebral disc degeneration, lumbar region: Secondary | ICD-10-CM | POA: Diagnosis not present

## 2016-11-18 DIAGNOSIS — M25559 Pain in unspecified hip: Secondary | ICD-10-CM

## 2016-11-18 DIAGNOSIS — M48062 Spinal stenosis, lumbar region with neurogenic claudication: Secondary | ICD-10-CM | POA: Diagnosis not present

## 2016-11-18 DIAGNOSIS — Z0000 Encounter for general adult medical examination without abnormal findings: Secondary | ICD-10-CM | POA: Diagnosis not present

## 2016-11-18 DIAGNOSIS — F419 Anxiety disorder, unspecified: Secondary | ICD-10-CM | POA: Diagnosis not present

## 2016-11-18 HISTORY — DX: Pain in unspecified hip: M25.559

## 2016-11-18 HISTORY — DX: Sepsis, unspecified organism: A41.9

## 2016-11-19 DIAGNOSIS — M87051 Idiopathic aseptic necrosis of right femur: Secondary | ICD-10-CM | POA: Diagnosis not present

## 2016-12-02 ENCOUNTER — Encounter: Payer: Self-pay | Admitting: Internal Medicine

## 2016-12-04 DIAGNOSIS — Z0000 Encounter for general adult medical examination without abnormal findings: Secondary | ICD-10-CM | POA: Diagnosis not present

## 2016-12-04 DIAGNOSIS — R300 Dysuria: Secondary | ICD-10-CM | POA: Diagnosis not present

## 2016-12-04 DIAGNOSIS — M545 Low back pain: Secondary | ICD-10-CM | POA: Diagnosis not present

## 2016-12-04 DIAGNOSIS — Z6829 Body mass index (BMI) 29.0-29.9, adult: Secondary | ICD-10-CM | POA: Diagnosis not present

## 2016-12-04 DIAGNOSIS — R350 Frequency of micturition: Secondary | ICD-10-CM | POA: Diagnosis not present

## 2016-12-04 DIAGNOSIS — E663 Overweight: Secondary | ICD-10-CM | POA: Diagnosis not present

## 2016-12-06 ENCOUNTER — Emergency Department (HOSPITAL_COMMUNITY): Payer: PPO

## 2016-12-06 ENCOUNTER — Encounter (HOSPITAL_COMMUNITY): Payer: Self-pay | Admitting: Cardiology

## 2016-12-06 ENCOUNTER — Inpatient Hospital Stay (HOSPITAL_COMMUNITY)
Admission: EM | Admit: 2016-12-06 | Discharge: 2016-12-14 | DRG: 853 | Disposition: A | Discharge status: Rehabilitation Facility | Payer: PPO | Attending: Neurosurgery | Admitting: Neurosurgery

## 2016-12-06 DIAGNOSIS — D473 Essential (hemorrhagic) thrombocythemia: Secondary | ICD-10-CM | POA: Diagnosis not present

## 2016-12-06 DIAGNOSIS — S32038A Other fracture of third lumbar vertebra, initial encounter for closed fracture: Secondary | ICD-10-CM | POA: Diagnosis not present

## 2016-12-06 DIAGNOSIS — S3992XA Unspecified injury of lower back, initial encounter: Secondary | ICD-10-CM | POA: Diagnosis not present

## 2016-12-06 DIAGNOSIS — M1990 Unspecified osteoarthritis, unspecified site: Secondary | ICD-10-CM | POA: Diagnosis present

## 2016-12-06 DIAGNOSIS — Z96641 Presence of right artificial hip joint: Secondary | ICD-10-CM | POA: Diagnosis present

## 2016-12-06 DIAGNOSIS — E872 Acidosis, unspecified: Secondary | ICD-10-CM

## 2016-12-06 DIAGNOSIS — R7989 Other specified abnormal findings of blood chemistry: Secondary | ICD-10-CM | POA: Diagnosis present

## 2016-12-06 DIAGNOSIS — E46 Unspecified protein-calorie malnutrition: Secondary | ICD-10-CM | POA: Diagnosis not present

## 2016-12-06 DIAGNOSIS — G8929 Other chronic pain: Secondary | ICD-10-CM

## 2016-12-06 DIAGNOSIS — S32009A Unspecified fracture of unspecified lumbar vertebra, initial encounter for closed fracture: Secondary | ICD-10-CM | POA: Diagnosis not present

## 2016-12-06 DIAGNOSIS — E871 Hypo-osmolality and hyponatremia: Secondary | ICD-10-CM | POA: Diagnosis present

## 2016-12-06 DIAGNOSIS — R4182 Altered mental status, unspecified: Secondary | ICD-10-CM | POA: Diagnosis present

## 2016-12-06 DIAGNOSIS — I6789 Other cerebrovascular disease: Secondary | ICD-10-CM | POA: Diagnosis not present

## 2016-12-06 DIAGNOSIS — M545 Low back pain: Secondary | ICD-10-CM

## 2016-12-06 DIAGNOSIS — G894 Chronic pain syndrome: Secondary | ICD-10-CM | POA: Diagnosis present

## 2016-12-06 DIAGNOSIS — F17200 Nicotine dependence, unspecified, uncomplicated: Secondary | ICD-10-CM | POA: Diagnosis not present

## 2016-12-06 DIAGNOSIS — W19XXXD Unspecified fall, subsequent encounter: Secondary | ICD-10-CM | POA: Diagnosis present

## 2016-12-06 DIAGNOSIS — G9340 Encephalopathy, unspecified: Secondary | ICD-10-CM | POA: Diagnosis present

## 2016-12-06 DIAGNOSIS — R7881 Bacteremia: Secondary | ICD-10-CM | POA: Diagnosis present

## 2016-12-06 DIAGNOSIS — M4326 Fusion of spine, lumbar region: Secondary | ICD-10-CM | POA: Diagnosis not present

## 2016-12-06 DIAGNOSIS — M5136 Other intervertebral disc degeneration, lumbar region: Secondary | ICD-10-CM | POA: Diagnosis present

## 2016-12-06 DIAGNOSIS — Z981 Arthrodesis status: Secondary | ICD-10-CM | POA: Diagnosis not present

## 2016-12-06 DIAGNOSIS — F17213 Nicotine dependence, cigarettes, with withdrawal: Secondary | ICD-10-CM | POA: Diagnosis not present

## 2016-12-06 DIAGNOSIS — M4324 Fusion of spine, thoracic region: Secondary | ICD-10-CM | POA: Diagnosis not present

## 2016-12-06 DIAGNOSIS — R4189 Other symptoms and signs involving cognitive functions and awareness: Secondary | ICD-10-CM | POA: Diagnosis not present

## 2016-12-06 DIAGNOSIS — G629 Polyneuropathy, unspecified: Secondary | ICD-10-CM | POA: Diagnosis not present

## 2016-12-06 DIAGNOSIS — R5383 Other fatigue: Secondary | ICD-10-CM | POA: Diagnosis not present

## 2016-12-06 DIAGNOSIS — Z888 Allergy status to other drugs, medicaments and biological substances status: Secondary | ICD-10-CM | POA: Diagnosis not present

## 2016-12-06 DIAGNOSIS — I959 Hypotension, unspecified: Secondary | ICD-10-CM | POA: Diagnosis not present

## 2016-12-06 DIAGNOSIS — S32040S Wedge compression fracture of fourth lumbar vertebra, sequela: Secondary | ICD-10-CM | POA: Diagnosis not present

## 2016-12-06 DIAGNOSIS — F17210 Nicotine dependence, cigarettes, uncomplicated: Secondary | ICD-10-CM | POA: Diagnosis present

## 2016-12-06 DIAGNOSIS — M48062 Spinal stenosis, lumbar region with neurogenic claudication: Secondary | ICD-10-CM | POA: Diagnosis present

## 2016-12-06 DIAGNOSIS — F419 Anxiety disorder, unspecified: Secondary | ICD-10-CM | POA: Diagnosis not present

## 2016-12-06 DIAGNOSIS — G8918 Other acute postprocedural pain: Secondary | ICD-10-CM

## 2016-12-06 DIAGNOSIS — M546 Pain in thoracic spine: Secondary | ICD-10-CM | POA: Diagnosis not present

## 2016-12-06 DIAGNOSIS — R4781 Slurred speech: Secondary | ICD-10-CM | POA: Diagnosis not present

## 2016-12-06 DIAGNOSIS — E8809 Other disorders of plasma-protein metabolism, not elsewhere classified: Secondary | ICD-10-CM | POA: Diagnosis not present

## 2016-12-06 DIAGNOSIS — E538 Deficiency of other specified B group vitamins: Secondary | ICD-10-CM | POA: Diagnosis present

## 2016-12-06 DIAGNOSIS — Z720 Tobacco use: Secondary | ICD-10-CM | POA: Diagnosis not present

## 2016-12-06 DIAGNOSIS — M79609 Pain in unspecified limb: Secondary | ICD-10-CM | POA: Diagnosis present

## 2016-12-06 DIAGNOSIS — E876 Hypokalemia: Secondary | ICD-10-CM | POA: Diagnosis present

## 2016-12-06 DIAGNOSIS — A419 Sepsis, unspecified organism: Principal | ICD-10-CM | POA: Diagnosis present

## 2016-12-06 DIAGNOSIS — Z419 Encounter for procedure for purposes other than remedying health state, unspecified: Secondary | ICD-10-CM

## 2016-12-06 DIAGNOSIS — M4856XA Collapsed vertebra, not elsewhere classified, lumbar region, initial encounter for fracture: Secondary | ICD-10-CM | POA: Diagnosis present

## 2016-12-06 DIAGNOSIS — D649 Anemia, unspecified: Secondary | ICD-10-CM | POA: Diagnosis not present

## 2016-12-06 DIAGNOSIS — D62 Acute posthemorrhagic anemia: Secondary | ICD-10-CM | POA: Diagnosis not present

## 2016-12-06 DIAGNOSIS — S32000A Wedge compression fracture of unspecified lumbar vertebra, initial encounter for closed fracture: Secondary | ICD-10-CM | POA: Diagnosis present

## 2016-12-06 DIAGNOSIS — S32030A Wedge compression fracture of third lumbar vertebra, initial encounter for closed fracture: Secondary | ICD-10-CM | POA: Diagnosis not present

## 2016-12-06 DIAGNOSIS — I739 Peripheral vascular disease, unspecified: Secondary | ICD-10-CM | POA: Diagnosis not present

## 2016-12-06 DIAGNOSIS — I10 Essential (primary) hypertension: Secondary | ICD-10-CM

## 2016-12-06 DIAGNOSIS — S32038D Other fracture of third lumbar vertebra, subsequent encounter for fracture with routine healing: Secondary | ICD-10-CM | POA: Diagnosis not present

## 2016-12-06 DIAGNOSIS — Z6828 Body mass index (BMI) 28.0-28.9, adult: Secondary | ICD-10-CM | POA: Diagnosis not present

## 2016-12-06 DIAGNOSIS — E440 Moderate protein-calorie malnutrition: Secondary | ICD-10-CM | POA: Diagnosis not present

## 2016-12-06 DIAGNOSIS — S32040A Wedge compression fracture of fourth lumbar vertebra, initial encounter for closed fracture: Secondary | ICD-10-CM | POA: Diagnosis not present

## 2016-12-06 DIAGNOSIS — K5900 Constipation, unspecified: Secondary | ICD-10-CM | POA: Diagnosis not present

## 2016-12-06 DIAGNOSIS — D75839 Thrombocytosis, unspecified: Secondary | ICD-10-CM

## 2016-12-06 DIAGNOSIS — S299XXA Unspecified injury of thorax, initial encounter: Secondary | ICD-10-CM | POA: Diagnosis not present

## 2016-12-06 DIAGNOSIS — J449 Chronic obstructive pulmonary disease, unspecified: Secondary | ICD-10-CM | POA: Diagnosis present

## 2016-12-06 DIAGNOSIS — D72828 Other elevated white blood cell count: Secondary | ICD-10-CM | POA: Diagnosis not present

## 2016-12-06 DIAGNOSIS — M879 Osteonecrosis, unspecified: Secondary | ICD-10-CM | POA: Diagnosis not present

## 2016-12-06 DIAGNOSIS — Z8249 Family history of ischemic heart disease and other diseases of the circulatory system: Secondary | ICD-10-CM | POA: Diagnosis not present

## 2016-12-06 DIAGNOSIS — M549 Dorsalgia, unspecified: Secondary | ICD-10-CM

## 2016-12-06 DIAGNOSIS — R21 Rash and other nonspecific skin eruption: Secondary | ICD-10-CM | POA: Diagnosis not present

## 2016-12-06 DIAGNOSIS — J439 Emphysema, unspecified: Secondary | ICD-10-CM | POA: Diagnosis not present

## 2016-12-06 DIAGNOSIS — Z79899 Other long term (current) drug therapy: Secondary | ICD-10-CM | POA: Diagnosis not present

## 2016-12-06 DIAGNOSIS — S32048D Other fracture of fourth lumbar vertebra, subsequent encounter for fracture with routine healing: Secondary | ICD-10-CM | POA: Diagnosis not present

## 2016-12-06 DIAGNOSIS — S32048A Other fracture of fourth lumbar vertebra, initial encounter for closed fracture: Secondary | ICD-10-CM | POA: Diagnosis not present

## 2016-12-06 DIAGNOSIS — M87051 Idiopathic aseptic necrosis of right femur: Secondary | ICD-10-CM | POA: Diagnosis present

## 2016-12-06 DIAGNOSIS — R51 Headache: Secondary | ICD-10-CM | POA: Diagnosis not present

## 2016-12-06 DIAGNOSIS — F4489 Other dissociative and conversion disorders: Secondary | ICD-10-CM | POA: Diagnosis not present

## 2016-12-06 DIAGNOSIS — F17203 Nicotine dependence unspecified, with withdrawal: Secondary | ICD-10-CM | POA: Diagnosis not present

## 2016-12-06 DIAGNOSIS — R5381 Other malaise: Secondary | ICD-10-CM | POA: Diagnosis not present

## 2016-12-06 HISTORY — DX: Sepsis, unspecified organism: A41.9

## 2016-12-06 HISTORY — DX: Pain in unspecified hip: M25.559

## 2016-12-06 LAB — CBC WITH DIFFERENTIAL/PLATELET
BASOS ABS: 0 10*3/uL (ref 0.0–0.1)
BASOS PCT: 0 %
EOS ABS: 0.1 10*3/uL (ref 0.0–0.7)
EOS PCT: 1 %
HCT: 34.8 % — ABNORMAL LOW (ref 39.0–52.0)
Hemoglobin: 11.4 g/dL — ABNORMAL LOW (ref 13.0–17.0)
Lymphocytes Relative: 20 %
Lymphs Abs: 2.3 10*3/uL (ref 0.7–4.0)
MCH: 31.1 pg (ref 26.0–34.0)
MCHC: 32.8 g/dL (ref 30.0–36.0)
MCV: 95.1 fL (ref 78.0–100.0)
Monocytes Absolute: 1.3 10*3/uL — ABNORMAL HIGH (ref 0.1–1.0)
Monocytes Relative: 11 %
NEUTROS PCT: 68 %
Neutro Abs: 8.1 10*3/uL — ABNORMAL HIGH (ref 1.7–7.7)
PLATELETS: 433 10*3/uL — AB (ref 150–400)
RBC: 3.66 MIL/uL — AB (ref 4.22–5.81)
RDW: 13.6 % (ref 11.5–15.5)
WBC: 11.8 10*3/uL — AB (ref 4.0–10.5)

## 2016-12-06 LAB — COMPREHENSIVE METABOLIC PANEL
ALBUMIN: 3.1 g/dL — AB (ref 3.5–5.0)
ALT: 33 U/L (ref 17–63)
AST: 40 U/L (ref 15–41)
Alkaline Phosphatase: 119 U/L (ref 38–126)
Anion gap: 11 (ref 5–15)
BUN: 6 mg/dL (ref 6–20)
CHLORIDE: 91 mmol/L — AB (ref 101–111)
CO2: 28 mmol/L (ref 22–32)
CREATININE: 0.74 mg/dL (ref 0.61–1.24)
Calcium: 8.7 mg/dL — ABNORMAL LOW (ref 8.9–10.3)
GFR calc non Af Amer: >60 mL/min (ref >60)
GLUCOSE: 100 mg/dL — AB (ref 65–99)
Potassium: 3.3 mmol/L — ABNORMAL LOW (ref 3.5–5.1)
SODIUM: 130 mmol/L — AB (ref 135–145)
Total Bilirubin: 0.4 mg/dL (ref 0.3–1.2)
Total Protein: 6.8 g/dL (ref 6.5–8.1)

## 2016-12-06 LAB — I-STAT CG4 LACTIC ACID, ED: LACTIC ACID, VENOUS: 2.14 mmol/L — AB (ref 0.5–1.9)

## 2016-12-06 LAB — CBG MONITORING, ED: Glucose-Capillary: 95 mg/dL (ref 65–99)

## 2016-12-06 LAB — URINALYSIS, ROUTINE W REFLEX MICROSCOPIC
BILIRUBIN URINE: NEGATIVE
GLUCOSE, UA: NEGATIVE mg/dL
HGB URINE DIPSTICK: NEGATIVE
Ketones, ur: NEGATIVE mg/dL
Leukocytes, UA: NEGATIVE
Nitrite: NEGATIVE
PROTEIN: NEGATIVE mg/dL
Specific Gravity, Urine: 1.008 (ref 1.005–1.030)
pH: 6 (ref 5.0–8.0)

## 2016-12-06 LAB — RAPID URINE DRUG SCREEN, HOSP PERFORMED
AMPHETAMINES: NOT DETECTED
Barbiturates: NOT DETECTED
Benzodiazepines: POSITIVE — AB
Cocaine: NOT DETECTED
Opiates: NOT DETECTED
Tetrahydrocannabinol: NOT DETECTED

## 2016-12-06 LAB — ETHANOL

## 2016-12-06 LAB — LIPASE, BLOOD: Lipase: 31 U/L (ref 11–51)

## 2016-12-06 LAB — ACETAMINOPHEN LEVEL

## 2016-12-06 MED ORDER — VANCOMYCIN HCL IN DEXTROSE 1-5 GM/200ML-% IV SOLN
1000.0000 mg | Freq: Three times a day (TID) | INTRAVENOUS | Status: DC
  Administered 2016-12-07 (×3): 1000 mg via INTRAVENOUS
  Filled 2016-12-06 (×4): qty 200

## 2016-12-06 MED ORDER — SODIUM CHLORIDE 0.9 % IV BOLUS (SEPSIS): 1000.0000 mL | Freq: Once | INTRAVENOUS | Status: DC

## 2016-12-06 MED ORDER — ONDANSETRON HCL 4 MG PO TABS: 4.0000 mg | ORAL_TABLET | Freq: Four times a day (QID) | ORAL | Status: DC | PRN

## 2016-12-06 MED ORDER — ACETAMINOPHEN 325 MG PO TABS
650.0000 mg | ORAL_TABLET | Freq: Four times a day (QID) | ORAL | Status: DC | PRN
  Administered 2016-12-08 – 2016-12-09 (×2): 650 mg via ORAL
  Filled 2016-12-06 (×2): qty 2

## 2016-12-06 MED ORDER — PIPERACILLIN-TAZOBACTAM 3.375 G IVPB
3.3750 g | Freq: Three times a day (TID) | INTRAVENOUS | Status: DC
  Administered 2016-12-06 – 2016-12-10 (×11): 3.375 g via INTRAVENOUS
  Filled 2016-12-06 (×12): qty 50

## 2016-12-06 MED ORDER — LACTULOSE 10 GM/15ML PO SOLN: 10.0000 g | Freq: Every evening | ORAL | Status: DC | PRN

## 2016-12-06 MED ORDER — HEPARIN SODIUM (PORCINE) 5000 UNIT/ML IJ SOLN
5000.0000 [IU] | Freq: Three times a day (TID) | INTRAMUSCULAR | Status: DC
  Administered 2016-12-07 – 2016-12-10 (×8): 5000 [IU] via SUBCUTANEOUS
  Filled 2016-12-06 (×10): qty 1

## 2016-12-06 MED ORDER — HYDROMORPHONE HCL 1 MG/ML IJ SOLN
1.0000 mg | Freq: Once | INTRAMUSCULAR | Status: AC
  Administered 2016-12-06: 1 mg via INTRAVENOUS
  Filled 2016-12-06: qty 1

## 2016-12-06 MED ORDER — SODIUM CHLORIDE 0.9% FLUSH
3.0000 mL | Freq: Two times a day (BID) | INTRAVENOUS | Status: DC
  Administered 2016-12-07 – 2016-12-13 (×7): 3 mL via INTRAVENOUS

## 2016-12-06 MED ORDER — FENTANYL CITRATE (PF) 100 MCG/2ML IJ SOLN
25.0000 ug | INTRAMUSCULAR | Status: DC | PRN
  Administered 2016-12-06 – 2016-12-08 (×8): 25 ug via INTRAVENOUS
  Filled 2016-12-06 (×8): qty 2

## 2016-12-06 MED ORDER — VANCOMYCIN HCL IN DEXTROSE 1-5 GM/200ML-% IV SOLN
1000.0000 mg | Freq: Once | INTRAVENOUS | Status: DC
  Filled 2016-12-06: qty 200

## 2016-12-06 MED ORDER — ACETAMINOPHEN 650 MG RE SUPP
650.0000 mg | Freq: Four times a day (QID) | RECTAL | Status: DC | PRN
  Administered 2016-12-09: 650 mg via RECTAL
  Filled 2016-12-06: qty 1

## 2016-12-06 MED ORDER — ONDANSETRON HCL 4 MG/2ML IJ SOLN: 4.0000 mg | Freq: Four times a day (QID) | INTRAMUSCULAR | Status: DC | PRN

## 2016-12-06 MED ORDER — DOCUSATE SODIUM 100 MG PO CAPS
100.0000 mg | ORAL_CAPSULE | Freq: Two times a day (BID) | ORAL | Status: DC
  Administered 2016-12-07 – 2016-12-08 (×3): 100 mg via ORAL
  Filled 2016-12-06 (×5): qty 1

## 2016-12-06 MED ORDER — IOPAMIDOL (ISOVUE-300) INJECTION 61%
100.0000 mL | Freq: Once | INTRAVENOUS | Status: AC | PRN
  Administered 2016-12-06: 100 mL via INTRAVENOUS

## 2016-12-06 MED ORDER — DEXTROSE-NACL 5-0.9 % IV SOLN
INTRAVENOUS | Status: DC
  Administered 2016-12-07: via INTRAVENOUS
  Administered 2016-12-07: 100 mL/h via INTRAVENOUS
  Administered 2016-12-09: 08:00:00 via INTRAVENOUS

## 2016-12-06 MED ORDER — NALOXONE HCL 0.4 MG/ML IJ SOLN
0.4000 mg | Freq: Once | INTRAMUSCULAR | Status: AC
  Administered 2016-12-06: 0.4 mg via INTRAVENOUS

## 2016-12-06 MED ORDER — ONDANSETRON HCL 4 MG/2ML IJ SOLN
4.0000 mg | Freq: Once | INTRAMUSCULAR | Status: AC
  Administered 2016-12-06: 4 mg via INTRAVENOUS
  Filled 2016-12-06: qty 2

## 2016-12-06 MED ORDER — VANCOMYCIN HCL 10 G IV SOLR: 1500.0000 mg | Freq: Once | INTRAVENOUS | Status: DC

## 2016-12-06 MED ORDER — SODIUM CHLORIDE 0.9 % IV SOLN
INTRAVENOUS | Status: DC
  Administered 2016-12-06: 14:00:00 via INTRAVENOUS

## 2016-12-06 MED ORDER — PIPERACILLIN-TAZOBACTAM 3.375 G IVPB 30 MIN
3.3750 g | Freq: Once | INTRAVENOUS | Status: AC
  Administered 2016-12-06: 3.375 g via INTRAVENOUS
  Filled 2016-12-06: qty 50

## 2016-12-06 MED ORDER — NALOXONE HCL 0.4 MG/ML IJ SOLN
INTRAMUSCULAR | Status: AC
  Filled 2016-12-06: qty 1

## 2016-12-06 MED ORDER — VANCOMYCIN HCL 10 G IV SOLR
1500.0000 mg | Freq: Once | INTRAVENOUS | Status: AC
  Administered 2016-12-06: 1500 mg via INTRAVENOUS
  Filled 2016-12-06: qty 1500

## 2016-12-06 NOTE — ED Provider Notes (Signed)
[facility] DEPT Provider Note   CSN: [id number] Arrival date & time: 12/06/16  1250     History   Chief Complaint Chief Complaint  Patient presents with  . Altered Mental Status    HPI Derek [name] is a 71 y.o. male.  She brought in by EMS on a call from family. For altered mental status. Patient has a long-standing history of chronic back pain. That is severe at times. In 09/23/2016 patient underwent his latest setback surgery. He was known to have spinal stenosis and neurogenic claudication and thoracic lumbar kyphosis and lumbago. He had a fracture of L1 and at L1-L2 degenerative disc disease. Patient had some improvement with the surgery. Patient's surgical wound appears to be healing fine. Family states that over this past week patient has been gradually getting more confused. The patient was seen by primary care doctor yesterday thought maybe there was a prostate infection and started on Cipro. Today patient was able to walk around bent over [place]. But then got to the point where he was too weak and also of was very confused. Family member said yesterday he was talking to people that were not present. Patient arrived here very confused and not able to follow commands. Seemed to be in pain any time he was moved.      Past Medical History:  Diagnosis Date  . Adenomatous polyps    remote past, on 5 year surveillance  . Arthritis   . Bell's palsy 40+yrs ago  . Cancer ([place])    skin  . COPD (chronic obstructive pulmonary disease) (HCC)    emphysema, pt. states he is not aware of this  . Diverticulitis   . Dyspnea    pt. denies  . Enlarged prostate   . Flat affect   . Headache(784.0)    having recently  . Hypertension   . Peripheral vascular disease ([place])   . Pneumonia     Patient Active Problem List   Diagnosis Date Noted  . Lumbar stenosis with neurogenic claudication 09/23/2016  . Avascular necrosis of bone of right hip ([place]) 11/20/2015  . OA  (osteoarthritis) of hip 11/20/2015  . Hepatomegaly 12/15/2011  . Adenomatous polyps 12/15/2011  . Atherosclerosis of native arteries of the extremities with intermittent claudication 09/15/2011  . Pain in limb 09/15/2011    Past Surgical History:  Procedure Laterality Date  . COLONOSCOPY  07/30/2005   Internal hemorrhoids; otherwise, normal rectum/ Left-sided diverticula  . COLONOSCOPY  01/11/2012   Procedure: COLONOSCOPY;  Surgeon: Daneil Dolin, MD;  [facility];  Service: Endoscopy;  Laterality: N/A;  1:15  . ILIAC ARTERY STENT  2006   Left CIA stenting  . ROTATOR CUFF REPAIR Right    3 different surgeries  . SINUS ENDO W/FUSION Left 08/18/2013   Procedure: ENDOSCOPIC SINUS SURGERY WITH FUSION NAVIGATION;  Surgeon: Melissa Montane, MD;  Location: [facility];  Service: ENT;  Laterality: Left;  . SINUS ENDO W/FUSION Left 12/21/2013   Procedure: ENDOSCOPIC SINUS SURGERY WITH FUSION NAVIGATION;  Surgeon: Melissa Montane, MD;  Location: [facility];  Service: ENT;  Laterality: Left;  ESS with fusion left frontal sinus with Rain Stent  . SPINE SURGERY     lumbar & cervical spine surgeries X 8   . TOTAL HIP ARTHROPLASTY Right 11/20/2015   Procedure: RIGHT TOTAL HIP ARTHROPLASTY ANTERIOR APPROACH;  Surgeon: Gaynelle Arabian, MD;  Location: [facility];  Service: Orthopedics;  Laterality: Right;  Marland Kitchen VASECTOMY  Home Medications    Prior to Admission medications   Medication Sig Start Date End Date Taking? Authorizing Provider  ciprofloxacin (CIPRO) 500 MG tablet Take 1 tablet by mouth 2 (two) times daily. 12/04/16  Yes [provider]  diazepam (VALIUM) 10 MG tablet Take 10 mg by mouth 4 (four) times daily. Anxiety   Yes [provider]  gabapentin (NEURONTIN) 600 MG tablet Take 600 mg by mouth 3 (three) times daily.   Yes [provider]  lisinopril (PRINIVIL,ZESTRIL) 20 MG tablet Take 20 mg by mouth daily before breakfast.  09/07/11  Yes [provider]  docusate  sodium (COLACE) 100 MG capsule Take 1 capsule (100 mg total) by mouth 2 (two) times daily. Patient not taking: Reported on 12/06/2016 09/24/16   Newman Pies, MD  lactulose [facility]) 10 GM/15ML solution Take 15-30 mLs by mouth at bedtime as needed for moderate constipation. As needed 07/24/13   [provider]  oxyCODONE 10 MG TABS Take 1 tablet (10 mg total) by mouth every 4 (four) hours as needed for moderate pain. Patient not taking: Reported on 12/06/2016 09/24/16   Newman Pies, MD    Family History Family History  Problem Relation Age of Onset  . Heart disease Mother        After age 36  . Heart attack Father   . Heart disease Father        Heart Disease before age 53  . Colon cancer Neg Hx     Social History Social History  Substance Use Topics  . Smoking status: Current Every Day Smoker    Packs/day: 2.00    Years: 52.00    Types: Cigarettes  . Smokeless tobacco: Never Used     Comment: Up to 2 pks per day depending on stress levels.   . Alcohol use No     Allergies   Prednisone   Review of Systems Review of Systems  Unable to perform ROS: Mental status change     Physical Exam Updated Vital Signs BP (!) 122/59   Pulse (!) 102   Temp 98.3 F (36.8 C) (Oral)   Resp 18   Ht 1.753 m (5\' 9" )   Wt 99.3 kg (219 lb)   SpO2 97%   BMI 32.34 kg/m   Physical Exam  Constitutional: He appears well-developed and well-nourished.  HENT:  Head: Normocephalic and atraumatic.  Mucous membranes dry  Eyes: Pupils are equal, round, and reactive to light. Conjunctivae and EOM are normal.  Neck: Normal range of motion.  Cardiovascular: Normal rate and regular rhythm.   Pulmonary/Chest: Effort normal and breath sounds normal.  Bilateral rhonchi  Abdominal: Soft. Bowel sounds are normal. There is no tenderness.  Musculoskeletal: Normal range of motion.  Moving all 4 extremities spontaneously.  Skin: Skin is warm. Capillary refill takes less than 2 seconds.  No rash noted.  Nursing note and vitals reviewed.    ED Treatments / Results  Labs (all labs ordered are listed, but only abnormal results are displayed) Labs Reviewed  CBC WITH DIFFERENTIAL/PLATELET - Abnormal; Notable for the following:       Result Value   WBC 11.8 (*)    RBC 3.66 (*)    Hemoglobin 11.4 (*)    HCT 34.8 (*)    Platelets 433 (*)    Neutro Abs 8.1 (*)    Monocytes Absolute 1.3 (*)    All other components within normal limits  COMPREHENSIVE METABOLIC PANEL - Abnormal; Notable for the  following:    Sodium 130 (*)    Potassium 3.3 (*)    Chloride 91 (*)    Glucose, Bld 100 (*)    Calcium 8.7 (*)    Albumin 3.1 (*)    All other components within normal limits  RAPID URINE DRUG SCREEN, HOSP PERFORMED - Abnormal; Notable for the following:    Benzodiazepines POSITIVE (*)    All other components within normal limits  ACETAMINOPHEN LEVEL - Abnormal; Notable for the following:    Acetaminophen (Tylenol), Serum <10 (*)    All other components within normal limits  I-STAT CG4 LACTIC ACID, ED - Abnormal; Notable for the following:    Lactic Acid, Venous 2.14 (*)    All other components within normal limits  CULTURE, BLOOD (ROUTINE X 2)  CULTURE, BLOOD (ROUTINE X 2)  ETHANOL  LIPASE, BLOOD  URINALYSIS, ROUTINE W REFLEX MICROSCOPIC  BASIC METABOLIC PANEL  CBC  CBG MONITORING, ED  I-STAT CG4 LACTIC ACID, ED    EKG  EKG Interpretation  Date/Time:  Sunday December 06 2016 13:10:45 EDT Ventricular Rate:  104 PR Interval:    QRS Duration: 85 QT Interval:  346 QTC Calculation: 456 R Axis:   57 Text Interpretation:  Sinus tachycardia Low voltage, extremity and precordial leads No significant change since last tracing Confirmed by Fredia Sorrow [phone]) on 12/06/2016 1:41:24 PM       Radiology Ct Head Wo Contrast  Result Date: 12/06/2016 CLINICAL DATA:  Patient has been lethargic for 3 days. EXAM: CT HEAD WITHOUT CONTRAST TECHNIQUE: Contiguous axial images  were obtained from the base of the skull through the vertex without intravenous contrast. COMPARISON:  CT scan August 15, 2013. FINDINGS: Brain: No subdural, epidural, or subarachnoid hemorrhage. Cerebellum, brainstem, and basal cisterns are normal. No mass effect or midline shift. Ventricles and sulci are unremarkable. No acute cortical ischemia or infarct. No other acute abnormalities are identified. Vascular: Atherosclerotic changes seen in the intracranial carotid arteries. Skull: Normal. Negative for fracture or focal lesion. Sinuses/Orbits: There is opacification of the left frontal sinus. Erosion into the left orbit and left frontal soft tissues on the previous study has resolved. There is surrounding bony sclerosis consistent with a chronic process. Paranasal sinuses, mastoid air cells, and middle ears are otherwise within normal limits. Other: None. IMPRESSION: 1. No acute intracranial process. 2. Sinus disease as above. Electronically Signed   By: Dorise Bullion III M.D   On: 12/06/2016 17:02   Ct Thoracic Spine W Contrast  Result Date: 12/06/2016 CLINICAL DATA:  Lethargy over the last 3 days. Fell today. Back pain. Back surgery 3 weeks ago. EXAM: CT THORACIC SPINE WITH CONTRAST TECHNIQUE: Multidetector CT images of thoracic was performed according to the standard protocol following intravenous contrast administration. CONTRAST:  80 cc Isovue 370 COMPARISON:  Recent radiography. FINDINGS: Alignment: Minimal curvature, not significant. No traumatic malalignment. Vertebrae: Previous ACDF C6-7. Previous spinal fusion beginning at T10 extending into the lumbar region with pedicle screws and posterior rods. No acute finding relating to either surgical area. No evidence of compression fracture or other focal bone lesion. Paraspinal and other soft tissues: Negative Disc levels: No significant finding at T9-10 or above. T10-11 and T11-12 show posterior fusion as noted above. Hardware appears well positioned.  No complicating feature. No traumatic finding. IMPRESSION: No acute, traumatic or unexpected finding in the thoracic region. The patient has recently at thoracolumbar fusion. In the thoracic region, this includes pedicle screws and posterior rods at T10, T11 and  T12 which have good appearances. No abnormality seen above that. Distant ACDF C6-7. Electronically Signed   By: Nelson Chimes M.D.   On: 12/06/2016 17:01   Ct Lumbar Spine W Contrast  Result Date: 12/06/2016 CLINICAL DATA:  Recent thoracolumbar fusion. Fall with subsequent back pain. EXAM: CT LUMBAR SPINE WITH CONTRAST TECHNIQUE: Multidetector CT imaging of the lumbar spine was performed with intravenous contrast administration. CONTRAST:  80 cc Isovue 370 COMPARISON:  Preoperative and perioperative imaging. FINDINGS: Segmentation: 5 lumbar type vertebral bodies as noted previously. Alignment: Normal Vertebrae: The patient had previous fusion from L2 to the sacrum. Recent surgical extension of the fusion from T10 to L3. Surgically discontinuous posterior rods at the T3-4 level. In the upper portion, hardware appears well positioned. Single questionable exception to this is that at the L1 level, the right-sided screw breaches the superior cortex of the vertebral body. At L3, there is acute fracture through the vertebral body and the posterior elements, functionally a chance fracture. At L4, there is acute compression fracture at the superior endplate with loss of height of 25%. L5 and the upper sacrum appear normal. Paraspinal and other soft tissues: No unexpected soft tissue finding. Previous arterial stent procedures. IMPRESSION: Extension of the fusion from T10-L3. Surgical discontinuity of the posterior rods at the 3/4 level with old solid fusion from L4 to the sacrum. Chance type fracture at the L3 level extending in an axial plane through the vertebral body and posterior elements. No antero or retrolisthesis. Compression fracture of the L4 vertebral  body at the superior aspect with loss of height of about 25%. Electronically Signed   By: Nelson Chimes M.D.   On: 12/06/2016 17:09   Dg Chest Port 1 View  Result Date: 12/06/2016 CLINICAL DATA:  Status post fall.  Confused. EXAM: PORTABLE CHEST 1 VIEW COMPARISON:  12/19/2013 FINDINGS: There is mild bilateral interstitial prominence. There is no focal parenchymal opacity. There is no pleural effusion or pneumothorax. There is mild stable cardiomegaly. The osseous structures are unremarkable. IMPRESSION: No active disease. Electronically Signed   By: Kathreen Devoid   On: 12/06/2016 13:35    Procedures Procedures (including critical care time)  CRITICAL CARE Performed by: Fredia Sorrow Total critical care time: 30 minutes Critical care time was exclusive of separately billable procedures and treating other patients. Critical care was necessary to treat or prevent imminent or life-threatening deterioration. Critical care was time spent personally by me on the following activities: development of treatment plan with patient and/or surrogate as well as nursing, discussions with consultants, evaluation of patient's response to treatment, examination of patient, obtaining history from patient or surrogate, ordering and performing treatments and interventions, ordering and review of laboratory studies, ordering and review of radiographic studies, pulse oximetry and re-evaluation of patient's condition.  Medications Ordered in ED Medications  0.9 %  sodium chloride infusion ( Intravenous Stopped 12/06/16 1710)  piperacillin-tazobactam (ZOSYN) IVPB 3.375 g (0 g Intravenous Stopped 12/06/16 1537)  vancomycin (VANCOCIN) IVPB 1000 mg/200 mL premix (not administered)  naloxone (NARCAN) injection 0.4 mg (0.4 mg Intravenous Given 12/06/16 1311)  piperacillin-tazobactam (ZOSYN) IVPB 3.375 g (0 g Intravenous Stopped 12/06/16 1533)  vancomycin (VANCOCIN) 1,500 mg in sodium chloride 0.9 % 500 mL IVPB (0 mg  Intravenous Stopped 12/06/16 1537)  HYDROmorphone (DILAUDID) injection 1 mg (1 mg Intravenous Given 12/06/16 1511)  ondansetron (ZOFRAN) injection 4 mg (4 mg Intravenous Given 12/06/16 1513)  HYDROmorphone (DILAUDID) injection 1 mg (1 mg Intravenous Given 12/06/16 1540)  iopamidol (ISOVUE-300)  61 % injection 100 mL (100 mLs Intravenous Contrast Given 12/06/16 1615)     Initial Impression / Assessment and Plan / ED Course  I have reviewed the triage vital signs and the nursing notes.  Pertinent labs & imaging results that were available during my care of the patient were reviewed by me and considered in my medical decision making (see chart for details).    Due to the altered mental status and the concern for possible prostate infection also concerned with possible infection at the surgical site although it was several weeks ago. Patient was started on sepsis protocol medications. Patient's lactic acid was slightly elevated. Also slight elevation in his white blood cell count. However chest x-ray negative for pneumonia. Urinalysis was negative for any signs of urinary tract infection. CT of head without any acute findings. CT lumbar and thoracic back with IV contrast without evidence of infection however this is not an ideal study for that. However it did show evidence of some new acute changes since the previous studies. In the lumbar area. At L3 there is an acute fracture to the vertebral body and the posterior elements functionally a chance fracture. And also at L4 there is an acute compression fractures. Endplate. With loss of height about 25%.  Contacted on-call neurosurgery for Dr. Arnoldo Morale group. Discussed it with Dr. Trenton Gammon. He reviewed the studies. Certainly these things could cause increased pain. Also was in agreement that we have not completely ruled out a surgical site infection. And that with his elevated white count and the lactic acid that the they recommended the patient be admitted on the  hospitalist service at [facility] and that he undergo MRI of the thoracic and lumbar area of the back to completely rule out infection. They are willing to consult. He is typically stated that he would have Dr. Arnoldo Morale see him tomorrow in the hospital.   In order to get the CT scan done patient did require pain medication. Patient received a total of 2 mg of hydromorphone and became very comfortable and we are able to get through the CT scan. So is clearly pain component. And based on his CT acute findings one would expect increased pain.  As stated above patient did receive antibiotics as per sepsis protocol. Patient never was hypotensive. Patient was given a total of 2 L of IV fluids not based on the protocol but just because he appeared dry. Family stated the patient hardly eats or drinks anything.   Final Clinical Impressions(s) / ED Diagnoses   Final diagnoses:  Altered mental status, unspecified altered mental status type  Chronic low back pain, unspecified back pain laterality, with sciatica presence unspecified    New Prescriptions New Prescriptions   No medications on file     Fredia Sorrow, MD 12/06/16 1958

## 2016-12-06 NOTE — ED Notes (Signed)
Call from [facility], E link requesting infor re whether or not pt is still code sepsis, if he is to be transferred   Informed that no communication re transfer has been received from [facility]

## 2016-12-06 NOTE — ED Notes (Signed)
Call to lab re labs pending

## 2016-12-06 NOTE — Progress Notes (Signed)
Pharmacy Antibiotic Note  GAETAN SPIEKER is a 71 y.o. male admitted on 12/06/2016 with sepsis.  Pharmacy has been consulted for Vancomycin and Zosyn dosing.  Plan: Vancomycin 1500 mg IV loading dose, then 1 gm IV every 8 hours Zosyn 3.375 GM IV every 8 hours, over 4 hours  Labs per protocol F/U cultures  Height: 5\' 9"  (175.3 cm) Weight: 219 lb (99.3 kg) IBW/kg (Calculated) : 70.7  Temp (24hrs), Avg:98.3 F (36.8 C), Min:98.3 F (36.8 C), Max:98.3 F (36.8 C)   Recent Labs Lab 12/06/16 1324 12/06/16 1400  WBC 11.8*  --   CREATININE 0.74  --   LATICACIDVEN  --  2.14*    Estimated Creatinine Clearance: 99.8 mL/min (by C-G formula based on SCr of 0.74 mg/dL).    Allergies  Allergen Reactions  . Prednisone Other (See Comments)    Wife report it makes him "crazy as a bat".    Antimicrobials this admission: Vancomycin  8/19 >>  Zosyn 8/19 >>     Thank you for allowing pharmacy to be a part of this patient's care.  Chriss Czar 12/06/2016 2:14 PM

## 2016-12-06 NOTE — ED Notes (Signed)
Wife reports that the medicine pt is out of is called norco

## 2016-12-06 NOTE — ED Notes (Signed)
Dr Z in to assess 

## 2016-12-06 NOTE — ED Notes (Signed)
Pt wife reports that pt run out of his pain meds on Monday and that Dr Gerarda Fraction did not renew because pt ahs not been released from his back doctor

## 2016-12-06 NOTE — ED Notes (Signed)
Explained to pt and family that [facility] has no tele beds, holding pts now  Trying to find bed

## 2016-12-06 NOTE — ED Triage Notes (Signed)
Pt lethargic for 3 days. Wife called EMS today due to pt fall.  Per EMS wife lowered pt to the floor with fall.  Pt difficulty to arouse upon arrival to ED.  Back surgery 2-3 weeks ago.  Pt has not eat today.  CBG 93.

## 2016-12-06 NOTE — ED Notes (Signed)
Dr Rogene Houston in to speak with pt and family

## 2016-12-06 NOTE — ED Notes (Signed)
C/o some pain with urination per son.

## 2016-12-06 NOTE — H&P (Signed)
History and Physical    DARTANYAN DEASIS MWN:[id number] DOB: [birth year] DOA: 12/06/2016  PCP: Redmond School, MD  Patient coming from: Home.    Chief Complaint:   Altered mental status.   HPI: Derek [name] is an 71 y.o. male with hx of chronic pain syndrome, known to have spinal stenosis, neurogenic claudication, with back surgery about 2 months ago, brought to the ER with increase confusion.  He saw his PCP and it was thought that he has prostatitis, and was started on Cipro.  Wife states he had NOT taken more narcotics, or having any other new meds.  Work up in the ER included a thoraco lumbar CT with contrast, which showed new compressive Fx, but no abscess.  WBC of 11.8K, and Hb of 11.4 g per dL, K of 3.3 and Cr of 0.74.  Ironically, he was given narcotics, and his mental status has actually improved.  EDP spoke with neurosurgery, Dr Trenton Gammon, covering for Dr Arnoldo Morale. He felt that spinal abscess cannot be r/out, recommending transfer to [facility], obtain MRI of the thoracic and lumbosacral spine, and he will consult.  In the interim, BC was obtained, and he was started on IV Van/Zosyn.  Hospitalist was asked to admit him to [facility].   ED Course:  See above.  Rewiew of Systems:  Constitutional: Negative for malaise, fever and chills. No significant weight loss or weight gain Eyes: Negative for eye pain, redness and discharge, diplopia, visual changes, or flashes of light. ENMT: Negative for ear pain, hoarseness, nasal congestion, sinus pressure and sore throat. No headaches; tinnitus, drooling, or problem swallowing. Cardiovascular: Negative for chest pain, palpitations, diaphoresis, dyspnea and peripheral edema. ; No orthopnea, PND Respiratory: Negative for cough, hemoptysis, wheezing and stridor. No pleuritic chestpain. Gastrointestinal: Negative for diarrhea, constipation,  melena, blood in stool, hematemesis, jaundice and rectal bleeding.    Genitourinary: Negative for frequency, dysuria,  incontinence,flank pain and hematuria; Musculoskeletal: Negative for back pain and neck pain. Negative for swelling and trauma.;  Skin: . Negative for pruritus, rash, abrasions, bruising and skin lesion.; ulcerations Neuro: Negative for headache, lightheadedness and neck stiffness. Negative for weakness, altered level of consciousness , altered mental status, extremity weakness, burning feet, involuntary movement, seizure and syncope.  Psych: negative for anxiety, depression, insomnia, tearfulness, panic attacks, hallucinations, paranoia, suicidal or homicidal ideation    Past Medical History:  Diagnosis Date  . Adenomatous polyps    remote past, on 5 year surveillance  . Arthritis   . Bell's palsy 40+yrs ago  . Cancer ([place])    skin  . COPD (chronic obstructive pulmonary disease) (HCC)    emphysema, pt. states he is not aware of this  . Diverticulitis   . Dyspnea    pt. denies  . Enlarged prostate   . Flat affect   . Headache(784.0)    having recently  . Hypertension   . Peripheral vascular disease ([place])   . Pneumonia     Past Surgical History:  Procedure Laterality Date  . COLONOSCOPY  07/30/2005   Internal hemorrhoids; otherwise, normal rectum/ Left-sided diverticula  . COLONOSCOPY  01/11/2012   Procedure: COLONOSCOPY;  Surgeon: Daneil Dolin, MD;  Location: [facility];  Service: Endoscopy;  Laterality: N/A;  1:15  . ILIAC ARTERY STENT  2006   Left CIA stenting  . ROTATOR CUFF REPAIR Right    3 different surgeries  . SINUS ENDO W/FUSION Left 08/18/2013   Procedure: ENDOSCOPIC SINUS SURGERY WITH FUSION NAVIGATION;  Surgeon: Jenny Reichmann  Janace Hoard, MD;  Location: [place];  Service: ENT;  Laterality: Left;  . SINUS ENDO W/FUSION Left 12/21/2013   Procedure: ENDOSCOPIC SINUS SURGERY WITH FUSION NAVIGATION;  Surgeon: Melissa Montane, MD;  Location: [place];  Service: ENT;  Laterality: Left;  ESS with fusion left frontal sinus with Rain Stent  . SPINE SURGERY     lumbar & cervical spine surgeries X  8   . TOTAL HIP ARTHROPLASTY Right 11/20/2015   Procedure: RIGHT TOTAL HIP ARTHROPLASTY ANTERIOR APPROACH;  Surgeon: Gaynelle Arabian, MD;  Location: [facility];  Service: Orthopedics;  Laterality: Right;  Marland Kitchen VASECTOMY       reports that he has been smoking Cigarettes.  He has a 104.00 pack-year smoking history. He has never used smokeless tobacco. He reports that he does not drink alcohol or use drugs.  Allergies  Allergen Reactions  . Prednisone Other (See Comments)    Wife report it makes him "crazy as a bat".    Family History  Problem Relation Age of Onset  . Heart disease Mother        After age 13  . Heart attack Father   . Heart disease Father        Heart Disease before age 93  . Colon cancer Neg Hx      Prior to Admission medications   Medication Sig Start Date End Date Taking? Authorizing Provider  ciprofloxacin (CIPRO) 500 MG tablet Take 1 tablet by mouth 2 (two) times daily. 12/04/16  Yes [provider]  diazepam (VALIUM) 10 MG tablet Take 10 mg by mouth 4 (four) times daily. Anxiety   Yes [provider]  gabapentin (NEURONTIN) 600 MG tablet Take 600 mg by mouth 3 (three) times daily.   Yes [provider]  lisinopril (PRINIVIL,ZESTRIL) 20 MG tablet Take 20 mg by mouth daily before breakfast.  09/07/11  Yes [provider]  docusate sodium (COLACE) 100 MG capsule Take 1 capsule (100 mg total) by mouth 2 (two) times daily. Patient not taking: Reported on 12/06/2016 09/24/16   Newman Pies, MD  lactulose [facility] 10 GM/15ML solution Take 15-30 mLs by mouth at bedtime as needed for moderate constipation. As needed 07/24/13   [provider]  oxyCODONE 10 MG TABS Take 1 tablet (10 mg total) by mouth every 4 (four) hours as needed for moderate pain. Patient not taking: Reported on 12/06/2016 09/24/16   Newman Pies, MD    Physical Exam: Vitals:   12/06/16 1750 12/06/16 1800 12/06/16 1918 12/06/16 1951  BP:  (!) 122/59 118/66 (!)  131/58  Pulse: (!) 104 (!) 102 (!) 106 (!) 105  Resp:   19 20  Temp:    98 F (36.7 C)  TempSrc:    Oral  SpO2: 97% 97% 91% 94%  Weight:      Height:          Constitutional: NAD, calm, comfortable Vitals:   12/06/16 1750 12/06/16 1800 12/06/16 1918 12/06/16 1951  BP:  (!) 122/59 118/66 (!) 131/58  Pulse: (!) 104 (!) 102 (!) 106 (!) 105  Resp:   19 20  Temp:    98 F (36.7 C)  TempSrc:    Oral  SpO2: 97% 97% 91% 94%  Weight:      Height:       Eyes: PERRL, lids and conjunctivae normal ENMT: Mucous membranes are moist. Posterior pharynx clear of any exudate or lesions.Normal dentition.  Neck: normal, supple, no masses, no thyromegaly Respiratory: clear to  auscultation bilaterally, no wheezing, no crackles. Normal respiratory effort. No accessory muscle use.  Cardiovascular: Regular rate and rhythm, no murmurs / rubs / gallops. No extremity edema. 2+ pedal pulses. No carotid bruits.  Abdomen: no tenderness, no masses palpated. No hepatosplenomegaly. Bowel sounds positive.  Musculoskeletal: no clubbing / cyanosis. No joint deformity upper and lower extremities. Good ROM, no contractures. Normal muscle tone.  Skin: no rashes, lesions, ulcers. No induration.  Wound from back surgery well healed.  Neurologic: CN 2-12 grossly intact. Sensation intact, DTR normal. Strength 5/5 in all 4.  Psychiatric: Normal judgment and insight. Alert and oriented x 3. Normal mood.    Labs on Admission: I have personally reviewed following labs and imaging studies  CBC:  Recent Labs Lab 12/06/16 1324  WBC 11.8*  NEUTROABS 8.1*  HGB 11.4*  HCT 34.8*  MCV 95.1  PLT 341*   Basic Metabolic Panel:  Recent Labs Lab 12/06/16 1324  NA 130*  K 3.3*  CL 91*  CO2 28  GLUCOSE 100*  BUN 6  CREATININE 0.74  CALCIUM 8.7*   GFR: Estimated Creatinine Clearance: 99.8 mL/min (by C-G formula based on SCr of 0.74 mg/dL). Liver Function Tests:  Recent Labs Lab 12/06/16 1324  AST 40  ALT 33   ALKPHOS 119  BILITOT 0.4  PROT 6.8  ALBUMIN 3.1*    Recent Labs Lab 12/06/16 1324  LIPASE 31   CBG:  Recent Labs Lab 12/06/16 1303  GLUCAP 95   Urine analysis:    Component Value Date/Time   COLORURINE YELLOW 12/06/2016 1352   APPEARANCEUR CLEAR 12/06/2016 1352   LABSPEC 1.008 12/06/2016 1352   PHURINE 6.0 12/06/2016 1352   GLUCOSEU NEGATIVE 12/06/2016 1352   HGBUR NEGATIVE 12/06/2016 1352   BILIRUBINUR NEGATIVE 12/06/2016 1352   KETONESUR NEGATIVE 12/06/2016 1352   PROTEINUR NEGATIVE 12/06/2016 1352   NITRITE NEGATIVE 12/06/2016 1352   LEUKOCYTESUR NEGATIVE 12/06/2016 1352   Radiological Exams on Admission: Ct Head Wo Contrast  Result Date: 12/06/2016 CLINICAL DATA:  Patient has been lethargic for 3 days. EXAM: CT HEAD WITHOUT CONTRAST TECHNIQUE: Contiguous axial images were obtained from the base of the skull through the vertex without intravenous contrast. COMPARISON:  CT scan August 15, 2013. FINDINGS: Brain: No subdural, epidural, or subarachnoid hemorrhage. Cerebellum, brainstem, and basal cisterns are normal. No mass effect or midline shift. Ventricles and sulci are unremarkable. No acute cortical ischemia or infarct. No other acute abnormalities are identified. Vascular: Atherosclerotic changes seen in the intracranial carotid arteries. Skull: Normal. Negative for fracture or focal lesion. Sinuses/Orbits: There is opacification of the left frontal sinus. Erosion into the left orbit and left frontal soft tissues on the previous study has resolved. There is surrounding bony sclerosis consistent with a chronic process. Paranasal sinuses, mastoid air cells, and middle ears are otherwise within normal limits. Other: None. IMPRESSION: 1. No acute intracranial process. 2. Sinus disease as above. Electronically Signed   By: Dorise Bullion III M.D   On: 12/06/2016 17:02   Ct Thoracic Spine W Contrast  Result Date: 12/06/2016 CLINICAL DATA:  Lethargy over the last 3 days. Fell  today. Back pain. Back surgery 3 weeks ago. EXAM: CT THORACIC SPINE WITH CONTRAST TECHNIQUE: Multidetector CT images of thoracic was performed according to the standard protocol following intravenous contrast administration. CONTRAST:  80 cc Isovue 370 COMPARISON:  Recent radiography. FINDINGS: Alignment: Minimal curvature, not significant. No traumatic malalignment. Vertebrae: Previous ACDF C6-7. Previous spinal fusion beginning at T10 extending into the lumbar  region with pedicle screws and posterior rods. No acute finding relating to either surgical area. No evidence of compression fracture or other focal bone lesion. Paraspinal and other soft tissues: Negative Disc levels: No significant finding at T9-10 or above. T10-11 and T11-12 show posterior fusion as noted above. Hardware appears well positioned. No complicating feature. No traumatic finding. IMPRESSION: No acute, traumatic or unexpected finding in the thoracic region. The patient has recently at thoracolumbar fusion. In the thoracic region, this includes pedicle screws and posterior rods at T10, T11 and T12 which have good appearances. No abnormality seen above that. Distant ACDF C6-7. Electronically Signed   By: Nelson Chimes M.D.   On: 12/06/2016 17:01   Ct Lumbar Spine W Contrast  Result Date: 12/06/2016 CLINICAL DATA:  Recent thoracolumbar fusion. Fall with subsequent back pain. EXAM: CT LUMBAR SPINE WITH CONTRAST TECHNIQUE: Multidetector CT imaging of the lumbar spine was performed with intravenous contrast administration. CONTRAST:  80 cc Isovue 370 COMPARISON:  Preoperative and perioperative imaging. FINDINGS: Segmentation: 5 lumbar type vertebral bodies as noted previously. Alignment: Normal Vertebrae: The patient had previous fusion from L2 to the sacrum. Recent surgical extension of the fusion from T10 to L3. Surgically discontinuous posterior rods at the T3-4 level. In the upper portion, hardware appears well positioned. Single questionable  exception to this is that at the L1 level, the right-sided screw breaches the superior cortex of the vertebral body. At L3, there is acute fracture through the vertebral body and the posterior elements, functionally a chance fracture. At L4, there is acute compression fracture at the superior endplate with loss of height of 25%. L5 and the upper sacrum appear normal. Paraspinal and other soft tissues: No unexpected soft tissue finding. Previous arterial stent procedures. IMPRESSION: Extension of the fusion from T10-L3. Surgical discontinuity of the posterior rods at the 3/4 level with old solid fusion from L4 to the sacrum. Chance type fracture at the L3 level extending in an axial plane through the vertebral body and posterior elements. No antero or retrolisthesis. Compression fracture of the L4 vertebral body at the superior aspect with loss of height of about 25%. Electronically Signed   By: Nelson Chimes M.D.   On: 12/06/2016 17:09   Dg Chest Port 1 View  Result Date: 12/06/2016 CLINICAL DATA:  Status post fall.  Confused. EXAM: PORTABLE CHEST 1 VIEW COMPARISON:  12/19/2013 FINDINGS: There is mild bilateral interstitial prominence. There is no focal parenchymal opacity. There is no pleural effusion or pneumothorax. There is mild stable cardiomegaly. The osseous structures are unremarkable. IMPRESSION: No active disease. Electronically Signed   By: Kathreen Devoid   On: 12/06/2016 13:35    EKG: Independently reviewed.   Assessment/Plan Active Problems:   Pain in limb   Avascular necrosis of bone of right hip (HCC)   Sepsis (HCC)   Altered mental status   PLAN:   Altered mental status:  Wife affirmed that he has not taken more pain medication.  In the ER, he was getting better.  Will continue to observe.  Admit to [facility] under hospitalist service.   Sepsis:  BC was done, will continue with IV antibiotics.  Obtain MRI with contrast of the thoracic and lumbosacral per neurosurgery.  HTN: stable.  BP is  controlled. Continue with meds.    DVT prophylaxis: SubQ heparin.  Code Status: FULL CODE.  Family Communication: Wife at bedside.  Disposition Plan: Home.  Consults called: Neurosurgery.  Admission status: Inpatient.    Loki Wuthrich MD  FACP. [facility]  If 7PM-7AM, please contact night-coverage [email or web address] Password [facility]  12/06/2016, 8:41 PM

## 2016-12-06 NOTE — ED Notes (Signed)
From CT, family at bedside - pt is asleep Awaiting results and dispo

## 2016-12-06 NOTE — ED Notes (Signed)
Pt in CT.

## 2016-12-06 NOTE — ED Notes (Signed)
Pt with frequent complaints of pain to his legs He is currently more alert and conversant with his sons

## 2016-12-06 NOTE — ED Notes (Signed)
Son reports that father reports that his leg is aching and cramping

## 2016-12-06 NOTE — ED Notes (Signed)
Pt boarding in ED as there are no available beds Bed pending has been changed to in priogress

## 2016-12-06 NOTE — ED Notes (Signed)
Pt moved in bed- lying on his R side spouse at bedside

## 2016-12-06 NOTE — ED Notes (Signed)
Pt CBG 95  Presents from home-Pt with AMS complaint of back pain  Back surgery in June with well healed scar to spine without redness nor swelling.  Pt responds to nipple pinch and opens eyes and complains of LLQ abd pain

## 2016-12-07 ENCOUNTER — Inpatient Hospital Stay (HOSPITAL_COMMUNITY): Payer: PPO

## 2016-12-07 ENCOUNTER — Encounter (HOSPITAL_COMMUNITY): Payer: Self-pay | Admitting: General Practice

## 2016-12-07 DIAGNOSIS — E876 Hypokalemia: Secondary | ICD-10-CM | POA: Diagnosis not present

## 2016-12-07 DIAGNOSIS — G894 Chronic pain syndrome: Secondary | ICD-10-CM | POA: Diagnosis not present

## 2016-12-07 DIAGNOSIS — K5900 Constipation, unspecified: Secondary | ICD-10-CM | POA: Diagnosis not present

## 2016-12-07 DIAGNOSIS — J449 Chronic obstructive pulmonary disease, unspecified: Secondary | ICD-10-CM | POA: Diagnosis not present

## 2016-12-07 DIAGNOSIS — E872 Acidosis: Secondary | ICD-10-CM | POA: Diagnosis not present

## 2016-12-07 DIAGNOSIS — M48062 Spinal stenosis, lumbar region with neurogenic claudication: Secondary | ICD-10-CM | POA: Diagnosis not present

## 2016-12-07 DIAGNOSIS — Z888 Allergy status to other drugs, medicaments and biological substances status: Secondary | ICD-10-CM | POA: Diagnosis not present

## 2016-12-07 DIAGNOSIS — M4856XA Collapsed vertebra, not elsewhere classified, lumbar region, initial encounter for fracture: Secondary | ICD-10-CM | POA: Diagnosis not present

## 2016-12-07 DIAGNOSIS — D62 Acute posthemorrhagic anemia: Secondary | ICD-10-CM | POA: Diagnosis not present

## 2016-12-07 DIAGNOSIS — I10 Essential (primary) hypertension: Secondary | ICD-10-CM | POA: Diagnosis not present

## 2016-12-07 DIAGNOSIS — E538 Deficiency of other specified B group vitamins: Secondary | ICD-10-CM | POA: Diagnosis not present

## 2016-12-07 DIAGNOSIS — Z79899 Other long term (current) drug therapy: Secondary | ICD-10-CM | POA: Diagnosis not present

## 2016-12-07 DIAGNOSIS — J439 Emphysema, unspecified: Secondary | ICD-10-CM | POA: Diagnosis not present

## 2016-12-07 DIAGNOSIS — I739 Peripheral vascular disease, unspecified: Secondary | ICD-10-CM | POA: Diagnosis not present

## 2016-12-07 DIAGNOSIS — F419 Anxiety disorder, unspecified: Secondary | ICD-10-CM | POA: Diagnosis not present

## 2016-12-07 DIAGNOSIS — G9340 Encephalopathy, unspecified: Secondary | ICD-10-CM | POA: Diagnosis not present

## 2016-12-07 DIAGNOSIS — A419 Sepsis, unspecified organism: Secondary | ICD-10-CM | POA: Diagnosis not present

## 2016-12-07 DIAGNOSIS — M1990 Unspecified osteoarthritis, unspecified site: Secondary | ICD-10-CM | POA: Diagnosis not present

## 2016-12-07 DIAGNOSIS — E871 Hypo-osmolality and hyponatremia: Secondary | ICD-10-CM | POA: Diagnosis not present

## 2016-12-07 DIAGNOSIS — F17210 Nicotine dependence, cigarettes, uncomplicated: Secondary | ICD-10-CM | POA: Diagnosis not present

## 2016-12-07 DIAGNOSIS — Z96641 Presence of right artificial hip joint: Secondary | ICD-10-CM | POA: Diagnosis not present

## 2016-12-07 DIAGNOSIS — R7989 Other specified abnormal findings of blood chemistry: Secondary | ICD-10-CM | POA: Diagnosis not present

## 2016-12-07 DIAGNOSIS — M879 Osteonecrosis, unspecified: Secondary | ICD-10-CM | POA: Diagnosis not present

## 2016-12-07 DIAGNOSIS — M5136 Other intervertebral disc degeneration, lumbar region: Secondary | ICD-10-CM | POA: Diagnosis not present

## 2016-12-07 LAB — CBC
HCT: 32.6 % — ABNORMAL LOW (ref 39.0–52.0)
Hemoglobin: 10.9 g/dL — ABNORMAL LOW (ref 13.0–17.0)
MCH: 31.9 pg (ref 26.0–34.0)
MCHC: 33.4 g/dL (ref 30.0–36.0)
MCV: 95.3 fL (ref 78.0–100.0)
PLATELETS: 426 10*3/uL — AB (ref 150–400)
RBC: 3.42 MIL/uL — ABNORMAL LOW (ref 4.22–5.81)
RDW: 14.1 % (ref 11.5–15.5)
WBC: 10.7 10*3/uL — AB (ref 4.0–10.5)

## 2016-12-07 LAB — BASIC METABOLIC PANEL
Anion gap: 9 (ref 5–15)
BUN: 6 mg/dL (ref 6–20)
CALCIUM: 8.4 mg/dL — AB (ref 8.9–10.3)
CO2: 25 mmol/L (ref 22–32)
CREATININE: 0.73 mg/dL (ref 0.61–1.24)
Chloride: 100 mmol/L — ABNORMAL LOW (ref 101–111)
Glucose, Bld: 115 mg/dL — ABNORMAL HIGH (ref 65–99)
Potassium: 3.4 mmol/L — ABNORMAL LOW (ref 3.5–5.1)
SODIUM: 134 mmol/L — AB (ref 135–145)

## 2016-12-07 LAB — TSH: TSH: 0.949 u[IU]/mL (ref 0.350–4.500)

## 2016-12-07 MED ORDER — VANCOMYCIN HCL IN DEXTROSE 1-5 GM/200ML-% IV SOLN
1000.0000 mg | Freq: Three times a day (TID) | INTRAVENOUS | Status: DC
  Administered 2016-12-08 (×2): 1000 mg via INTRAVENOUS
  Filled 2016-12-07 (×3): qty 200

## 2016-12-07 MED ORDER — METHOCARBAMOL 1000 MG/10ML IJ SOLN
500.0000 mg | Freq: Three times a day (TID) | INTRAVENOUS | Status: DC
  Administered 2016-12-07 – 2016-12-14 (×17): 500 mg via INTRAVENOUS
  Filled 2016-12-07 (×9): qty 5
  Filled 2016-12-07: qty 550
  Filled 2016-12-07 (×7): qty 5
  Filled 2016-12-07: qty 550
  Filled 2016-12-07 (×3): qty 5

## 2016-12-07 MED ORDER — MORPHINE SULFATE (PF) 2 MG/ML IV SOLN
1.0000 mg | INTRAVENOUS | Status: DC | PRN
  Administered 2016-12-07 – 2016-12-08 (×3): 3 mg via INTRAVENOUS
  Administered 2016-12-09: 2 mg via INTRAVENOUS
  Filled 2016-12-07 (×2): qty 2
  Filled 2016-12-07: qty 1
  Filled 2016-12-07: qty 2

## 2016-12-07 MED ORDER — GADOBENATE DIMEGLUMINE 529 MG/ML IV SOLN
20.0000 mL | Freq: Once | INTRAVENOUS | Status: AC | PRN
  Administered 2016-12-07: 08:00:00 via INTRAVENOUS

## 2016-12-07 MED ORDER — DIAZEPAM 5 MG/ML IJ SOLN
5.0000 mg | Freq: Once | INTRAMUSCULAR | Status: AC
  Administered 2016-12-07: 5 mg via INTRAVENOUS
  Filled 2016-12-07: qty 2

## 2016-12-07 NOTE — ED Notes (Signed)
Report given to Tracey RN.

## 2016-12-07 NOTE — Progress Notes (Signed)
[facility] was paged several time because of pain not being resolved new orders were given and administered. Will continue to monitor. Arthor Captain LPN

## 2016-12-07 NOTE — Progress Notes (Signed)
PROGRESS NOTE    Derek [name]  ULA:[id number] DOB: 25-Mar-1946 DOA: 12/06/2016 PCP: Redmond School, MD       Brief Narrative:  71 y.o. male with hx of chronic pain syndrome, known to have spinal stenosis, neurogenic claudication, with back surgery about 2 months ago, brought to the ER with increase confusion / worsening lower back pain. Workup in our ER showed new lumbar compression fracture, tachycardic, leukocytosis, neurosurgery contacted and recommended MRI to be done and transfer to [facility] to rule out any infectious process.  Assessment & Plan:   1-worsening lower back pain with a new compression fracture by the CAT scan: Neurosurgery wanted MRI to be done to rule out any infectious process we are going to order MRI here for the thoracic and lumbar spine continue with IV antibiotics and follow-up with the cultures, pending transfer to [facility] per neurosurgery recommendations. 2-History of hypertension : blood pressure is controlled , avoid starting any blood pressure medications for now.  DVT prophylaxis: (heparin) Code Status: (Full) Family Communication: (none  ) Disposition Plan: to  [facility]  Consultants:   NSX  Procedures: NONE  Antimicrobials: (specify start and planned stop date. Auto populated tables are space occupying and do not give end dates) Zosyn and Vanco day 1 Subjective: Complains of severe low back pain denies any fevers or chills or nausea or vomiting  Objective: Vitals:   12/07/16 0445 12/07/16 0500 12/07/16 0600 12/07/16 0900  BP:  (!) 132/57 115/72 (!) 107/53  Pulse:  92 87 98  Resp: 19 16 17 20   Temp:      TempSrc:      SpO2:  98% 95% 97%  Weight:      Height:        Intake/Output Summary (Last 24 hours) at 12/07/16 0916 Last data filed at 12/07/16 0105  Gross per 24 hour  Intake              203 ml  Output                0 ml  Net              203 ml   Filed Weights   12/06/16 1254  Weight: 99.3 kg (219 lb)     Examination:  General exam: Appears calm and comfortable  Respiratory system: Clear to auscultation. Respiratory effort normal. Cardiovascular system: S1 & S2 heard, RRR. No JVD, murmurs, rubs, gallops or clicks. No pedal edema. Gastrointestinal system: Abdomen is nondistended, soft and nontender. No organomegaly or masses felt. Normal bowel sounds heard. Central nervous system: Alert and oriented. No focal neurological deficits, bilateral lower extension weakness in the setting of severe low back pain. Extremities: 3-4/5 muscle strength in lower extremity.. Skin: No rashes, lesions or ulcers Psychiatry: Judgement and insight appear normal. Mood & affect appropriate.     Data Reviewed: I have personally reviewed following labs and imaging studies  CBC:  Recent Labs Lab 12/06/16 1324 12/07/16 0507  WBC 11.8* 10.7*  NEUTROABS 8.1*  --   HGB 11.4* 10.9*  HCT 34.8* 32.6*  MCV 95.1 95.3  PLT 433* 212*   Basic Metabolic Panel:  Recent Labs Lab 12/06/16 1324 12/07/16 0507  NA 130* 134*  K 3.3* 3.4*  CL 91* 100*  CO2 28 25  GLUCOSE 100* 115*  BUN 6 6  CREATININE 0.74 0.73  CALCIUM 8.7* 8.4*   GFR: Estimated Creatinine Clearance: 99.8 mL/min (by C-G formula based on SCr of 0.73  mg/dL). Liver Function Tests:  Recent Labs Lab 12/06/16 1324  AST 40  ALT 33  ALKPHOS 119  BILITOT 0.4  PROT 6.8  ALBUMIN 3.1*    Recent Labs Lab 12/06/16 1324  LIPASE 31   No results for input(s): AMMONIA in the last 168 hours. Coagulation Profile: No results for input(s): INR, PROTIME in the last 168 hours. Cardiac Enzymes: No results for input(s): CKTOTAL, CKMB, CKMBINDEX, TROPONINI in the last 168 hours. BNP (last 3 results) No results for input(s): PROBNP in the last 8760 hours. HbA1C: No results for input(s): HGBA1C in the last 72 hours. CBG:  Recent Labs Lab 12/06/16 1303  GLUCAP 95   Lipid Profile: No results for input(s): CHOL, HDL, LDLCALC, TRIG, CHOLHDL,  LDLDIRECT in the last 72 hours. Thyroid Function Tests:  Recent Labs  12/07/16 0507  TSH 0.949   Anemia Panel: No results for input(s): VITAMINB12, FOLATE, FERRITIN, TIBC, IRON, RETICCTPCT in the last 72 hours. Urine analysis:    Component Value Date/Time   COLORURINE YELLOW 12/06/2016 1352   APPEARANCEUR CLEAR 12/06/2016 1352   LABSPEC 1.008 12/06/2016 1352   PHURINE 6.0 12/06/2016 1352   GLUCOSEU NEGATIVE 12/06/2016 1352   HGBUR NEGATIVE 12/06/2016 1352   BILIRUBINUR NEGATIVE 12/06/2016 1352   KETONESUR NEGATIVE 12/06/2016 1352   PROTEINUR NEGATIVE 12/06/2016 1352   NITRITE NEGATIVE 12/06/2016 1352   LEUKOCYTESUR NEGATIVE 12/06/2016 1352   Sepsis Labs: @LABRCNTIP (procalcitonin:4,lacticidven:4)  ) Recent Results (from the past 240 hour(s))  Blood Culture (routine x 2)     Status: None (Preliminary result)   Collection Time: 12/06/16  1:36 PM  Result Value Ref Range Status   Specimen Description BLOOD RIGHT ANTECUBITAL  Final   Special Requests Blood Culture adequate volume  Final   Culture NO GROWTH < 24 HOURS  Final   Report Status PENDING  Incomplete  Blood Culture (routine x 2)     Status: None (Preliminary result)   Collection Time: 12/06/16  1:43 PM  Result Value Ref Range Status   Specimen Description BLOOD LEFT ANTECUBITAL  Final   Special Requests Blood Culture adequate volume  Final   Culture NO GROWTH < 24 HOURS  Final   Report Status PENDING  Incomplete         Radiology Studies: Ct Head Wo Contrast  Result Date: 12/06/2016 CLINICAL DATA:  Patient has been lethargic for 3 days. EXAM: CT HEAD WITHOUT CONTRAST TECHNIQUE: Contiguous axial images were obtained from the base of the skull through the vertex without intravenous contrast. COMPARISON:  CT scan August 15, 2013. FINDINGS: Brain: No subdural, epidural, or subarachnoid hemorrhage. Cerebellum, brainstem, and basal cisterns are normal. No mass effect or midline shift. Ventricles and sulci are  unremarkable. No acute cortical ischemia or infarct. No other acute abnormalities are identified. Vascular: Atherosclerotic changes seen in the intracranial carotid arteries. Skull: Normal. Negative for fracture or focal lesion. Sinuses/Orbits: There is opacification of the left frontal sinus. Erosion into the left orbit and left frontal soft tissues on the previous study has resolved. There is surrounding bony sclerosis consistent with a chronic process. Paranasal sinuses, mastoid air cells, and middle ears are otherwise within normal limits. Other: None. IMPRESSION: 1. No acute intracranial process. 2. Sinus disease as above. Electronically Signed   By: Dorise Bullion III M.D   On: 12/06/2016 17:02   Ct Thoracic Spine W Contrast  Result Date: 12/06/2016 CLINICAL DATA:  Lethargy over the last 3 days. Fell today. Back pain. Back surgery 3 weeks ago.  EXAM: CT THORACIC SPINE WITH CONTRAST TECHNIQUE: Multidetector CT images of thoracic was performed according to the standard protocol following intravenous contrast administration. CONTRAST:  80 cc Isovue 370 COMPARISON:  Recent radiography. FINDINGS: Alignment: Minimal curvature, not significant. No traumatic malalignment. Vertebrae: Previous ACDF C6-7. Previous spinal fusion beginning at T10 extending into the lumbar region with pedicle screws and posterior rods. No acute finding relating to either surgical area. No evidence of compression fracture or other focal bone lesion. Paraspinal and other soft tissues: Negative Disc levels: No significant finding at T9-10 or above. T10-11 and T11-12 show posterior fusion as noted above. Hardware appears well positioned. No complicating feature. No traumatic finding. IMPRESSION: No acute, traumatic or unexpected finding in the thoracic region. The patient has recently at thoracolumbar fusion. In the thoracic region, this includes pedicle screws and posterior rods at T10, T11 and T12 which have good appearances. No  abnormality seen above that. Distant ACDF C6-7. Electronically Signed   By: Nelson Chimes M.D.   On: 12/06/2016 17:01   Ct Lumbar Spine W Contrast  Result Date: 12/06/2016 CLINICAL DATA:  Recent thoracolumbar fusion. Fall with subsequent back pain. EXAM: CT LUMBAR SPINE WITH CONTRAST TECHNIQUE: Multidetector CT imaging of the lumbar spine was performed with intravenous contrast administration. CONTRAST:  80 cc Isovue 370 COMPARISON:  Preoperative and perioperative imaging. FINDINGS: Segmentation: 5 lumbar type vertebral bodies as noted previously. Alignment: Normal Vertebrae: The patient had previous fusion from L2 to the sacrum. Recent surgical extension of the fusion from T10 to L3. Surgically discontinuous posterior rods at the T3-4 level. In the upper portion, hardware appears well positioned. Single questionable exception to this is that at the L1 level, the right-sided screw breaches the superior cortex of the vertebral body. At L3, there is acute fracture through the vertebral body and the posterior elements, functionally a chance fracture. At L4, there is acute compression fracture at the superior endplate with loss of height of 25%. L5 and the upper sacrum appear normal. Paraspinal and other soft tissues: No unexpected soft tissue finding. Previous arterial stent procedures. IMPRESSION: Extension of the fusion from T10-L3. Surgical discontinuity of the posterior rods at the 3/4 level with old solid fusion from L4 to the sacrum. Chance type fracture at the L3 level extending in an axial plane through the vertebral body and posterior elements. No antero or retrolisthesis. Compression fracture of the L4 vertebral body at the superior aspect with loss of height of about 25%. Electronically Signed   By: Nelson Chimes M.D.   On: 12/06/2016 17:09   Dg Chest Port 1 View  Result Date: 12/06/2016 CLINICAL DATA:  Status post fall.  Confused. EXAM: PORTABLE CHEST 1 VIEW COMPARISON:  12/19/2013 FINDINGS: There is  mild bilateral interstitial prominence. There is no focal parenchymal opacity. There is no pleural effusion or pneumothorax. There is mild stable cardiomegaly. The osseous structures are unremarkable. IMPRESSION: No active disease. Electronically Signed   By: Kathreen Devoid   On: 12/06/2016 13:35        Scheduled Meds: . docusate sodium  100 mg Oral BID  . heparin  5,000 Units Subcutaneous Q8H  . sodium chloride flush  3 mL Intravenous Q12H   Continuous Infusions: . sodium chloride Stopped (12/06/16 1710)  . dextrose 5 % and 0.9% NaCl 100 mL/hr at 12/07/16 0007  . piperacillin-tazobactam (ZOSYN)  IV Stopped (12/07/16 0842)  . vancomycin 1,000 mg (12/07/16 0902)     LOS: 1 day    Time spent: 35  Rosaland Lao, MD [facility]   If 7PM-7AM, please contact night-coverage [email or web address] Password [email or web address] 12/07/2016, 9:16 AM

## 2016-12-07 NOTE — Progress Notes (Signed)
Patient was given 5 mg IV Valium. 5mg  of Valium was wasted in the sink. Arthor Captain LPN. Witnessed by Shiela Mayer, RN.

## 2016-12-07 NOTE — Progress Notes (Signed)
Pt admitted to [facility] via [facility].  Wife at bedside.  Patient lethargic but easily aroused.  Telemetry placed on patient and verified with CCMD.  Call bell within reach.  Bed alarm set.  At present patient is resting quietly.

## 2016-12-08 ENCOUNTER — Inpatient Hospital Stay (HOSPITAL_COMMUNITY): Payer: PPO

## 2016-12-08 DIAGNOSIS — J449 Chronic obstructive pulmonary disease, unspecified: Secondary | ICD-10-CM

## 2016-12-08 DIAGNOSIS — F419 Anxiety disorder, unspecified: Secondary | ICD-10-CM

## 2016-12-08 DIAGNOSIS — D75839 Thrombocytosis, unspecified: Secondary | ICD-10-CM

## 2016-12-08 DIAGNOSIS — E872 Acidosis, unspecified: Secondary | ICD-10-CM

## 2016-12-08 DIAGNOSIS — S32030A Wedge compression fracture of third lumbar vertebra, initial encounter for closed fracture: Secondary | ICD-10-CM

## 2016-12-08 DIAGNOSIS — M48062 Spinal stenosis, lumbar region with neurogenic claudication: Secondary | ICD-10-CM

## 2016-12-08 DIAGNOSIS — E871 Hypo-osmolality and hyponatremia: Secondary | ICD-10-CM

## 2016-12-08 DIAGNOSIS — S32000A Wedge compression fracture of unspecified lumbar vertebra, initial encounter for closed fracture: Secondary | ICD-10-CM | POA: Diagnosis present

## 2016-12-08 DIAGNOSIS — D473 Essential (hemorrhagic) thrombocythemia: Secondary | ICD-10-CM

## 2016-12-08 DIAGNOSIS — E876 Hypokalemia: Secondary | ICD-10-CM

## 2016-12-08 DIAGNOSIS — M549 Dorsalgia, unspecified: Secondary | ICD-10-CM

## 2016-12-08 DIAGNOSIS — A419 Sepsis, unspecified organism: Principal | ICD-10-CM

## 2016-12-08 DIAGNOSIS — S32040A Wedge compression fracture of fourth lumbar vertebra, initial encounter for closed fracture: Secondary | ICD-10-CM

## 2016-12-08 DIAGNOSIS — D649 Anemia, unspecified: Secondary | ICD-10-CM

## 2016-12-08 LAB — CBC WITH DIFFERENTIAL/PLATELET
BASOS ABS: 0 10*3/uL (ref 0.0–0.1)
Basophils Relative: 0 %
EOS PCT: 1 %
Eosinophils Absolute: 0.1 10*3/uL (ref 0.0–0.7)
HEMATOCRIT: 32.1 % — AB (ref 39.0–52.0)
Hemoglobin: 10.4 g/dL — ABNORMAL LOW (ref 13.0–17.0)
LYMPHS ABS: 2.6 10*3/uL (ref 0.7–4.0)
LYMPHS PCT: 23 %
MCH: 30.5 pg (ref 26.0–34.0)
MCHC: 32.4 g/dL (ref 30.0–36.0)
MCV: 94.1 fL (ref 78.0–100.0)
MONO ABS: 1.1 10*3/uL — AB (ref 0.1–1.0)
MONOS PCT: 10 %
NEUTROS ABS: 7.2 10*3/uL (ref 1.7–7.7)
Neutrophils Relative %: 66 %
PLATELETS: 435 10*3/uL — AB (ref 150–400)
RBC: 3.41 MIL/uL — ABNORMAL LOW (ref 4.22–5.81)
RDW: 14.2 % (ref 11.5–15.5)
WBC: 11 10*3/uL — ABNORMAL HIGH (ref 4.0–10.5)

## 2016-12-08 LAB — PHOSPHORUS: PHOSPHORUS: 3.6 mg/dL (ref 2.5–4.6)

## 2016-12-08 LAB — COMPREHENSIVE METABOLIC PANEL
ALT: 34 U/L (ref 17–63)
ANION GAP: 8 (ref 5–15)
AST: 38 U/L (ref 15–41)
Albumin: 2.6 g/dL — ABNORMAL LOW (ref 3.5–5.0)
Alkaline Phosphatase: 97 U/L (ref 38–126)
BILIRUBIN TOTAL: 0.7 mg/dL (ref 0.3–1.2)
BUN: 7 mg/dL (ref 6–20)
CHLORIDE: 102 mmol/L (ref 101–111)
CO2: 27 mmol/L (ref 22–32)
Calcium: 8.5 mg/dL — ABNORMAL LOW (ref 8.9–10.3)
Creatinine, Ser: 0.95 mg/dL (ref 0.61–1.24)
Glucose, Bld: 95 mg/dL (ref 65–99)
POTASSIUM: 3.5 mmol/L (ref 3.5–5.1)
Sodium: 137 mmol/L (ref 135–145)
TOTAL PROTEIN: 5.7 g/dL — AB (ref 6.5–8.1)

## 2016-12-08 LAB — MAGNESIUM: MAGNESIUM: 1.7 mg/dL (ref 1.7–2.4)

## 2016-12-08 LAB — VANCOMYCIN, TROUGH: Vancomycin Tr: 22 ug/mL (ref 15–20)

## 2016-12-08 MED ORDER — MUSCLE RUB 10-15 % EX CREA
TOPICAL_CREAM | CUTANEOUS | Status: DC | PRN
  Administered 2016-12-08: 1 via TOPICAL
  Filled 2016-12-08: qty 85

## 2016-12-08 MED ORDER — VANCOMYCIN HCL 10 G IV SOLR
1250.0000 mg | Freq: Two times a day (BID) | INTRAVENOUS | Status: DC
  Administered 2016-12-08 – 2016-12-11 (×6): 1250 mg via INTRAVENOUS
  Filled 2016-12-08 (×7): qty 1250

## 2016-12-08 MED ORDER — DIAZEPAM 5 MG/ML IJ SOLN
2.5000 mg | Freq: Once | INTRAMUSCULAR | Status: AC
  Administered 2016-12-08: 2.5 mg via INTRAVENOUS
  Filled 2016-12-08: qty 2

## 2016-12-08 MED ORDER — LORAZEPAM 2 MG/ML IJ SOLN
2.0000 mg | Freq: Once | INTRAMUSCULAR | Status: AC | PRN
  Administered 2016-12-08: 2 mg via INTRAVENOUS
  Filled 2016-12-08: qty 1

## 2016-12-08 MED ORDER — HYDROMORPHONE HCL 1 MG/ML IJ SOLN
1.0000 mg | Freq: Once | INTRAMUSCULAR | Status: AC
  Administered 2016-12-08: 1 mg via INTRAVENOUS
  Filled 2016-12-08: qty 1

## 2016-12-08 MED ORDER — MORPHINE SULFATE (PF) 4 MG/ML IV SOLN
4.0000 mg | Freq: Once | INTRAVENOUS | Status: AC
  Administered 2016-12-08: 4 mg via INTRAVENOUS
  Filled 2016-12-08: qty 1

## 2016-12-08 NOTE — Progress Notes (Signed)
0.5 mg Valium wasted in sharps. Arthor Captain LPN. Witnessed by Shiela Mayer, RN.

## 2016-12-08 NOTE — Care Management Note (Signed)
Case Management Note  Patient Details  Name: Derek [name] MRN: [id number] Date of Birth: 20-Jan-1946  Subjective/Objective:      Pt admitted with sepsis. He is from home with his spouse.               Action/Plan: Awaiting MRI of his back. CM following for d/c needs, physician orders.   Expected Discharge Date:                  Expected Discharge Plan:     In-House Referral:     Discharge planning Services     Post Acute Care Choice:    Choice offered to:     DME Arranged:    DME Agency:     HH Arranged:    HH Agency:     Status of Service:  In process, will continue to follow  If discussed at Long Length of Stay Meetings, dates discussed:    Additional Comments:  Pollie Friar, RN 12/08/2016, 1:24 PM

## 2016-12-08 NOTE — Progress Notes (Signed)
Pharmacy Antibiotic Note  Derek [name] is a 71 y.o. male admitted on 12/06/2016 with sepsis, lumbar infection, hardware present.  Pharmacy has been consulted for Vancomycin and Zosyn dosing.  Patient afebrile, wbc slightly elevated at 11, scr slight trend up 0.7>0.9, low uop overnight.   Vancomycin trough above goal this evening at 22.  Plan: Vancomycin 1250mg  IV q12 hours Zosyn 3.375 GM IV every 8 hours, over 4 hours  Labs per protocol F/U cultures  Height: 5\' 9"  (175.3 cm) Weight: 219 lb (99.3 kg) IBW/kg (Calculated) : 70.7  Temp (24hrs), Avg:98.2 F (36.8 C), Min:97.6 F (36.4 C), Max:98.4 F (36.9 C)   Recent Labs Lab 12/06/16 1324 12/06/16 1400 12/07/16 0507 12/08/16 0921 12/08/16 1757  WBC 11.8*  --  10.7* 11.0*  --   CREATININE 0.74  --  0.73 0.95  --   LATICACIDVEN  --  2.14*  --   --   --   VANCOTROUGH  --   --   --   --  22*    Estimated Creatinine Clearance: 84 mL/min (by C-G formula based on SCr of 0.95 mg/dL).    Allergies  Allergen Reactions  . Prednisone Other (See Comments)    Wife report it makes him "crazy as a bat".    Antimicrobials this admission: Vancomycin  8/19 >>  Zosyn 8/19 >>   Thank you for allowing pharmacy to be a part of this patient's care.  Erin Hearing PharmD., BCPS Clinical Pharmacist Pager [phone] 12/08/2016 7:33 PM

## 2016-12-08 NOTE — Progress Notes (Signed)
[facility] paged because patient pain management was not working. New Orders received.

## 2016-12-08 NOTE — Progress Notes (Signed)
[facility] paged ad I spoke with her on phone informed her that patient pain is not managed and I had to replace his IV and place tele on patient numerous times. Green mittens were used to keep him from removing telemetry box and IV site but he keep taking them off and removing IV, New orders received will continue to monitor. Arthor Captain LPN

## 2016-12-08 NOTE — Consult Note (Signed)
Subjective:  The patient is a 71 year old white male well known to me.  I performed multiple surgeries on him.  The most recent was in extension of his lumbar fusion up to T10 on 09/23/2016.  The patient did well.  I have seen him in the office since then.  His wife tells me that about a week ago he began having increasing pain.  She does not recall any specific precipitating events.  He did take a bit of a fall about a week ago on the way to the ER.  The patient was seen at the [facility] on  12/06/2016.  He was worked up with a lumbar CT which demonstrated evidence of a Chance fracture at L3 and L4.  He was transferred to [facility].  He has been worked up with a lumbar MRI.  The patient is having a lot of pain.  He has had no trouble with his wound.  He complains of back pain.  He is not complaining of radicular leg pain.  Objective: Vital signs in last 24 hours: Temp:  [98.4 F (36.9 C)-98.5 F (36.9 C)] 98.4 F (36.9 C) (08/21 1033) Pulse Rate:  [82-100] 91 (08/21 1033) Resp:  [16-20] 20 (08/21 1033) BP: (122-144)/(53-83) 143/66 (08/21 1033) SpO2:  [96 %-98 %] 98 % (08/21 1033)  Intake/Output from previous day: 08/20 0701 - 08/21 0700 In: 2130.4 [I.V.:1780.4; IV Piggyback:350] Out: 401 [Urine:401] Intake/Output this shift: No intake/output data recorded.  Physical exam   The patient is a bit somnolent but easily arousable.  He answers questions appropriately.  His lumbar incision is well-healed without signs of infection.  His strength is grossly normal in his bilateral quadriceps, gastrocnemius and dorsiflexors.  Palpation of his lumbar spine is unremarkable.  I have reviewed the patient's lumbar CT performed at [facility].  It demonstrates he has evidence of L3 and L4 chance fractures, I.e. Between his 2 fusion segments.  Lab Results:  Recent Labs  12/07/16 0507 12/08/16 0921  WBC 10.7* 11.0*  HGB 10.9* 10.4*  HCT 32.6* 32.1*  PLT 426* 435*    BMET  Recent Labs  12/07/16 0507 12/08/16 0921  NA 134* 137  K 3.4* 3.5  CL 100* 102  CO2 25 27  GLUCOSE 115* 95  BUN 6 7  CREATININE 0.73 0.95  CALCIUM 8.4* 8.5*    Studies/Results: Ct Head Wo Contrast  Result Date: 12/06/2016 CLINICAL DATA:  Patient has been lethargic for 3 days. EXAM: CT HEAD WITHOUT CONTRAST TECHNIQUE: Contiguous axial images were obtained from the base of the skull through the vertex without intravenous contrast. COMPARISON:  CT scan August 15, 2013. FINDINGS: Brain: No subdural, epidural, or subarachnoid hemorrhage. Cerebellum, brainstem, and basal cisterns are normal. No mass effect or midline shift. Ventricles and sulci are unremarkable. No acute cortical ischemia or infarct. No other acute abnormalities are identified. Vascular: Atherosclerotic changes seen in the intracranial carotid arteries. Skull: Normal. Negative for fracture or focal lesion. Sinuses/Orbits: There is opacification of the left frontal sinus. Erosion into the left orbit and left frontal soft tissues on the previous study has resolved. There is surrounding bony sclerosis consistent with a chronic process. Paranasal sinuses, mastoid air cells, and middle ears are otherwise within normal limits. Other: None. IMPRESSION: 1. No acute intracranial process. 2. Sinus disease as above. Electronically Signed   By: Dorise Bullion III M.D   On: 12/06/2016 17:02   Ct Thoracic Spine W Contrast  Result Date: 12/06/2016 CLINICAL  DATA:  Lethargy over the last 3 days. Fell today. Back pain. Back surgery 3 weeks ago. EXAM: CT THORACIC SPINE WITH CONTRAST TECHNIQUE: Multidetector CT images of thoracic was performed according to the standard protocol following intravenous contrast administration. CONTRAST:  80 cc Isovue 370 COMPARISON:  Recent radiography. FINDINGS: Alignment: Minimal curvature, not significant. No traumatic malalignment. Vertebrae: Previous ACDF C6-7. Previous spinal fusion beginning at T10  extending into the lumbar region with pedicle screws and posterior rods. No acute finding relating to either surgical area. No evidence of compression fracture or other focal bone lesion. Paraspinal and other soft tissues: Negative Disc levels: No significant finding at T9-10 or above. T10-11 and T11-12 show posterior fusion as noted above. Hardware appears well positioned. No complicating feature. No traumatic finding. IMPRESSION: No acute, traumatic or unexpected finding in the thoracic region. The patient has recently at thoracolumbar fusion. In the thoracic region, this includes pedicle screws and posterior rods at T10, T11 and T12 which have good appearances. No abnormality seen above that. Distant ACDF C6-7. Electronically Signed   By: Nelson Chimes M.D.   On: 12/06/2016 17:01   Ct Lumbar Spine W Contrast  Result Date: 12/06/2016 CLINICAL DATA:  Recent thoracolumbar fusion. Fall with subsequent back pain. EXAM: CT LUMBAR SPINE WITH CONTRAST TECHNIQUE: Multidetector CT imaging of the lumbar spine was performed with intravenous contrast administration. CONTRAST:  80 cc Isovue 370 COMPARISON:  Preoperative and perioperative imaging. FINDINGS: Segmentation: 5 lumbar type vertebral bodies as noted previously. Alignment: Normal Vertebrae: The patient had previous fusion from L2 to the sacrum. Recent surgical extension of the fusion from T10 to L3. Surgically discontinuous posterior rods at the T3-4 level. In the upper portion, hardware appears well positioned. Single questionable exception to this is that at the L1 level, the right-sided screw breaches the superior cortex of the vertebral body. At L3, there is acute fracture through the vertebral body and the posterior elements, functionally a chance fracture. At L4, there is acute compression fracture at the superior endplate with loss of height of 25%. L5 and the upper sacrum appear normal. Paraspinal and other soft tissues: No unexpected soft tissue finding.  Previous arterial stent procedures. IMPRESSION: Extension of the fusion from T10-L3. Surgical discontinuity of the posterior rods at the 3/4 level with old solid fusion from L4 to the sacrum. Chance type fracture at the L3 level extending in an axial plane through the vertebral body and posterior elements. No antero or retrolisthesis. Compression fracture of the L4 vertebral body at the superior aspect with loss of height of about 25%. Electronically Signed   By: Nelson Chimes M.D.   On: 12/06/2016 17:09    Assessment/Plan:   L3 and L4 chance fracture, lumbago:  It appears that the patient has broken down his old fusion segment at L3-4 and L4-5, I.e. Between his upper and lower hardware.  He will likely need to have this revised surgically.  I will await the results of the lumbar MRI before making definitive recommendations.  I want to make sure he does not have an infection.  I have answered all the patient's, and his wife's, questions.  LOS: 2 days     Derek [name] D 12/08/2016, 2:27 PM

## 2016-12-08 NOTE — Progress Notes (Addendum)
PROGRESS NOTE    Derek [name]  ZOX:[id number] DOB: 1945-08-17 DOA: 12/06/2016 PCP: Redmond School, MD   Brief Narrative:  The patient is a 71 y.o.malewith hx of chronic pain syndrome, known to have spinal stenosis, neurogenic claudication, with back surgery about 2 months ago, brought to the ER with increase confusion / worsening lower back pain. Work up at [employer] new lumbar compression fracture, tachycardic, leukocytosis, neurosurgery contacted and recommended MRI to be done and transferred to [facility] to rule out any infectious process. MRI ordered and pending and Neurosurgery evaluated and feel that patient will likely need surgical revision but awaiting MRI results to make further definitive treatment recommendations.   Assessment & Plan:   Active Problems:   Pain in limb   Avascular necrosis of bone of right hip (HCC)   Lumbar stenosis with neurogenic claudication   Sepsis (HCC)   Altered mental status   Hyponatremia   Hypokalemia   Lactic acidosis   Normocytic anemia   Anxiety   COPD (chronic obstructive pulmonary disease) (HCC)   Back pain   Compression fracture of lumbar vertebra (HCC)   Thrombocytosis (HCC)  Worsening lower back pain/Lumbago with a New Compression fractures by the CAT scan at L3 and L4 rule out Infection  -CT Thoracic Spine showed No acute, traumatic or unexpected finding in the thoracic region. The patient has recently at thoracolumbar fusion. In the thoracic region, this includes pedicle screws and posterior rods at T10, T11 and T12 which have good appearances. No abnormality seen above that. Distant ACDF C6-7 -CT Lumbar Spine showed Extension of the fusion from T10-L3. Surgical discontinuity of the posterior rods at the 3/4 level with old solid fusion from L4 to the sacrum. Chance type fracture at the L3 level extending in an axial plane through the vertebral body and posterior elements. No antero or retrolisthesis. Compression fracture of  the L4 vertebral body at the superior aspect with loss of height of about 25%. -Neurosurgery wanted MRI to be done to rule out any infectious process we are going to order MRI here for the thoracic and lumbar spine; MRI and Thoracic Spine pending to be Done  -Continue with IV antibiotics with IV Zosy; IV Vancomycin currently held for a Vanc Trough -Blood Cx x 2 show NGTD -Pain Control with Acetaminophen 650 mg po/RC q6hprn mild pain, Fentanyl 25 mcg q2hprn, Morphine 1-3 mg q4hprn Moderate Severe Pain, Muscle Rub Cream, Topically, and Robaxin 550 mg po Daily on MAR; Will D/C Fenatnyl and keep Morphine -Patient received Dilaudid this AM.  -Bowel Regimen with Docusate Sodium 100 mg po BID and Lactulose 10-20 grams po qHSprn for Moderate Constipation -C/w D5 NS at 100 mL/hr -Neurosurgery seen and examined and thinks he will need surgical revision but will need Lumbar MRI before making definitive recommendations and want to rule out infection  Acute Encephalopathy with Increased Confusion, improved -Did not Take More than Required Pain Medication -PCP thought patient had Prostatitis and was started on Ciprofloxacin as an outpatient   -CT Head showed No acute intracranial process. Sinus disease as above -Gabapentin and Diazepam Home medications currently held  -Continue to Treat Pain  -UDS Positive for Benzos  -C/w IVF Rehydration as Above  ? Sepsis; ? Lumbar Infection -As above -Lactic Acid Level was 2.14; No Repeat -Obtain Procalcctonin and Lactic Acid level in AM  -WBC went from 11.8 -> 10.7 -> 11.0; ? Reactive from Pain -C/w IVF with D5W NS at 100 mL/hr -Continue with IV  antibiotics with IV Zosy; IV Vancomycin Discontinued yesterday -Blood Cx x 2 show NGTD  Hypertension -Blood pressure is controlled -Avoid starting Home BP med of Lisinopril for now  Hx of COPD/Emphysema -Currently not decompensated -Continue to Monitor Respiratory Status and if necessary add Nebs  Anxiety -Takes  Diazepam 10 mg 4 times Daily but currently held -Got 2.5 mg of Valium this AM and 5 mg of Valium last night -May need to restart Valium Slowly -Monitor for Withdrawals   Thrombocytosis -Likely Reactive; ? Infection -Platelet Count went from 433 -> 426 -> 435 -Continue to Monitor and Repeat CBC in AM  Hyponatremia -Improved with IVF Rehydration -Na+ went from 130 -> 134 -> 137 -Continue to Monitor and Repeat CMP in AM  Hypokalemia -Improving. K+ went from 3.3 -> 3.4 -> 3.5 -Continue to Monitor and Replete As Necessary -Repeat CMP in AM  Lactic Acidosis  -Lactic Acid Level on Admission was 2.14  -C/w IVF Rehydration and Repeat Lactic Acid Level in AM  Normocytic Anemia -Unknown Etiology so will obtain Anemia Panel -Hb/Hct went from 11.4/34.8 -> 10.9/32.6 -> 10.4/32.1; Likely Dilutional Drop -Continue to Monitor for S/Sx of Bleeding and Repeat CBC in AM  DVT prophylaxis: Heparin 5,000 units sq q8h Code Status: FULL CODE Family Communication: Discussed with Wife at bedside Disposition Plan: Remain Inpatient and Obtain MRI  Consultants:   Discussed Case with NeuroSurgery Dr. Arnoldo Morale    Procedures: MRI of T Spine and L-Spine Ordered   Antimicrobials:  Anti-infectives    Start     Dose/Rate Route Frequency Ordered Stop   12/08/16 0200  vancomycin (VANCOCIN) IVPB 1000 mg/200 mL premix  Status:  Discontinued     1,000 mg 200 mL/hr over 60 Minutes Intravenous Every 8 hours 12/07/16 2008 12/08/16 1420   12/07/16 0000  vancomycin (VANCOCIN) IVPB 1000 mg/200 mL premix  Status:  Discontinued     1,000 mg 200 mL/hr over 60 Minutes Intravenous Every 8 hours 12/06/16 1414 12/07/16 2008   12/06/16 2200  piperacillin-tazobactam (ZOSYN) IVPB 3.375 g     3.375 g 12.5 mL/hr over 240 Minutes Intravenous Every 8 hours 12/06/16 1412     12/06/16 1400  vancomycin (VANCOCIN) 1,500 mg in sodium chloride 0.9 % 500 mL IVPB     1,500 mg 250 mL/hr over 120 Minutes Intravenous  Once 12/06/16  1345 12/06/16 1537   12/06/16 1345  piperacillin-tazobactam (ZOSYN) IVPB 3.375 g     3.375 g 100 mL/hr over 30 Minutes Intravenous  Once 12/06/16 1338 12/06/16 1533   12/06/16 1345  vancomycin (VANCOCIN) IVPB 1000 mg/200 mL premix  Status:  Discontinued     1,000 mg 200 mL/hr over 60 Minutes Intravenous  Once 12/06/16 1338 12/06/16 1342   12/06/16 1345  vancomycin (VANCOCIN) 1,500 mg in sodium chloride 0.9 % 500 mL IVPB  Status:  Discontinued     1,500 mg 250 mL/hr over 120 Minutes Intravenous  Once 12/06/16 1342 12/06/16 1345     Subjective: Seen and examined at bedside and was in severe pain. Was slightly confused but more awake than what he was per Wife. No CP or SOB. States back hurts on movement. No other concerns or complaints at this time.   Objective: Vitals:   12/07/16 2100 12/08/16 0100 12/08/16 0500 12/08/16 1033  BP: 129/83 (!) 141/65 (!) 144/59 (!) 143/66  Pulse: 100 92 100 91  Resp:  20 20 20   Temp: 98.4 F (36.9 C) 98.4 F (36.9 C) 98.4 F (36.9 C) 98.4  F (36.9 C)  TempSrc: Oral Oral Oral Oral  SpO2: 96% 97% 97% 98%  Weight:      Height:        Intake/Output Summary (Last 24 hours) at 12/08/16 1515 Last data filed at 12/08/16 6256  Gross per 24 hour  Intake           158.33 ml  Output              401 ml  Net          -242.67 ml   Filed Weights   12/06/16 1254  Weight: 99.3 kg (219 lb)   Examination: Physical Exam:  Constitutional: WN/WD obese Caucasian male appears in Pain and uncomfortable and appears slightly confused Eyes: Lids and conjunctivae normal, sclerae anicteric  ENMT: External Ears, Nose appear normal. Grossly normal hearing. Mucous membranes are moist.   Neck: Appears normal, supple, no cervical masses, normal ROM, no appreciable thyromegaly, no JVD Respiratory: Clear to auscultation bilaterally, no wheezing, rales, rhonchi or crackles. Normal respiratory effort and patient is not tachypenic. No accessory muscle use.  Cardiovascular:  RRR, no murmurs / rubs / gallops. S1 and S2 auscultated. No extremity edema.  Abdomen: Soft, non-tender, non-distended. No masses palpated. No appreciable hepatosplenomegaly. Bowel sounds positive.  GU: Deferred. Musculoskeletal: No clubbing / cyanosis of digits/nails. No joint deformity upper and lower extremities.  Skin: No rashes, lesions, ulcers on a limited skin eval. No induration; Warm and dry.  Neurologic: CN 2-12 grossly intact with no focal deficits. Strength 4/5 in Lower Extremities in the setting of pain. Romberg sign cerebellar reflexes not assessed.  Psychiatric: Impaired judgment and insight. Slightly somnolent but arousable. Normal mood and appropriate flat affect.   Data Reviewed: I have personally reviewed following labs and imaging studies  CBC:  Recent Labs Lab 12/06/16 1324 12/07/16 0507 12/08/16 0921  WBC 11.8* 10.7* 11.0*  NEUTROABS 8.1*  --  7.2  HGB 11.4* 10.9* 10.4*  HCT 34.8* 32.6* 32.1*  MCV 95.1 95.3 94.1  PLT 433* 426* 389*   Basic Metabolic Panel:  Recent Labs Lab 12/06/16 1324 12/07/16 0507 12/08/16 0921  NA 130* 134* 137  K 3.3* 3.4* 3.5  CL 91* 100* 102  CO2 28 25 27   GLUCOSE 100* 115* 95  BUN 6 6 7   CREATININE 0.74 0.73 0.95  CALCIUM 8.7* 8.4* 8.5*  MG  --   --  1.7  PHOS  --   --  3.6   GFR: Estimated Creatinine Clearance: 84 mL/min (by C-G formula based on SCr of 0.95 mg/dL). Liver Function Tests:  Recent Labs Lab 12/06/16 1324 12/08/16 0921  AST 40 38  ALT 33 34  ALKPHOS 119 97  BILITOT 0.4 0.7  PROT 6.8 5.7*  ALBUMIN 3.1* 2.6*    Recent Labs Lab 12/06/16 1324  LIPASE 31   No results for input(s): AMMONIA in the last 168 hours. Coagulation Profile: No results for input(s): INR, PROTIME in the last 168 hours. Cardiac Enzymes: No results for input(s): CKTOTAL, CKMB, CKMBINDEX, TROPONINI in the last 168 hours. BNP (last 3 results) No results for input(s): PROBNP in the last 8760 hours. HbA1C: No results for  input(s): HGBA1C in the last 72 hours. CBG:  Recent Labs Lab 12/06/16 1303  GLUCAP 95   Lipid Profile: No results for input(s): CHOL, HDL, LDLCALC, TRIG, CHOLHDL, LDLDIRECT in the last 72 hours. Thyroid Function Tests:  Recent Labs  12/07/16 0507  TSH 0.949   Anemia Panel: No results for  input(s): VITAMINB12, FOLATE, FERRITIN, TIBC, IRON, RETICCTPCT in the last 72 hours. Sepsis Labs:  Recent Labs Lab 12/06/16 1400  LATICACIDVEN 2.14*    Recent Results (from the past 240 hour(s))  Blood Culture (routine x 2)     Status: None (Preliminary result)   Collection Time: 12/06/16  1:36 PM  Result Value Ref Range Status   Specimen Description BLOOD RIGHT ANTECUBITAL  Final   Special Requests Blood Culture adequate volume  Final   Culture NO GROWTH 2 DAYS  Final   Report Status PENDING  Incomplete  Blood Culture (routine x 2)     Status: None (Preliminary result)   Collection Time: 12/06/16  1:43 PM  Result Value Ref Range Status   Specimen Description BLOOD LEFT ANTECUBITAL  Final   Special Requests Blood Culture adequate volume  Final   Culture NO GROWTH 2 DAYS  Final   Report Status PENDING  Incomplete    Radiology Studies: Ct Head Wo Contrast  Result Date: 12/06/2016 CLINICAL DATA:  Patient has been lethargic for 3 days. EXAM: CT HEAD WITHOUT CONTRAST TECHNIQUE: Contiguous axial images were obtained from the base of the skull through the vertex without intravenous contrast. COMPARISON:  CT scan August 15, 2013. FINDINGS: Brain: No subdural, epidural, or subarachnoid hemorrhage. Cerebellum, brainstem, and basal cisterns are normal. No mass effect or midline shift. Ventricles and sulci are unremarkable. No acute cortical ischemia or infarct. No other acute abnormalities are identified. Vascular: Atherosclerotic changes seen in the intracranial carotid arteries. Skull: Normal. Negative for fracture or focal lesion. Sinuses/Orbits: There is opacification of the left frontal sinus.  Erosion into the left orbit and left frontal soft tissues on the previous study has resolved. There is surrounding bony sclerosis consistent with a chronic process. Paranasal sinuses, mastoid air cells, and middle ears are otherwise within normal limits. Other: None. IMPRESSION: 1. No acute intracranial process. 2. Sinus disease as above. Electronically Signed   By: Dorise Bullion III M.D   On: 12/06/2016 17:02   Ct Thoracic Spine W Contrast  Result Date: 12/06/2016 CLINICAL DATA:  Lethargy over the last 3 days. Fell today. Back pain. Back surgery 3 weeks ago. EXAM: CT THORACIC SPINE WITH CONTRAST TECHNIQUE: Multidetector CT images of thoracic was performed according to the standard protocol following intravenous contrast administration. CONTRAST:  80 cc Isovue 370 COMPARISON:  Recent radiography. FINDINGS: Alignment: Minimal curvature, not significant. No traumatic malalignment. Vertebrae: Previous ACDF C6-7. Previous spinal fusion beginning at T10 extending into the lumbar region with pedicle screws and posterior rods. No acute finding relating to either surgical area. No evidence of compression fracture or other focal bone lesion. Paraspinal and other soft tissues: Negative Disc levels: No significant finding at T9-10 or above. T10-11 and T11-12 show posterior fusion as noted above. Hardware appears well positioned. No complicating feature. No traumatic finding. IMPRESSION: No acute, traumatic or unexpected finding in the thoracic region. The patient has recently at thoracolumbar fusion. In the thoracic region, this includes pedicle screws and posterior rods at T10, T11 and T12 which have good appearances. No abnormality seen above that. Distant ACDF C6-7. Electronically Signed   By: Nelson Chimes M.D.   On: 12/06/2016 17:01   Ct Lumbar Spine W Contrast  Result Date: 12/06/2016 CLINICAL DATA:  Recent thoracolumbar fusion. Fall with subsequent back pain. EXAM: CT LUMBAR SPINE WITH CONTRAST TECHNIQUE:  Multidetector CT imaging of the lumbar spine was performed with intravenous contrast administration. CONTRAST:  80 cc Isovue 370 COMPARISON:  Preoperative  and perioperative imaging. FINDINGS: Segmentation: 5 lumbar type vertebral bodies as noted previously. Alignment: Normal Vertebrae: The patient had previous fusion from L2 to the sacrum. Recent surgical extension of the fusion from T10 to L3. Surgically discontinuous posterior rods at the T3-4 level. In the upper portion, hardware appears well positioned. Single questionable exception to this is that at the L1 level, the right-sided screw breaches the superior cortex of the vertebral body. At L3, there is acute fracture through the vertebral body and the posterior elements, functionally a chance fracture. At L4, there is acute compression fracture at the superior endplate with loss of height of 25%. L5 and the upper sacrum appear normal. Paraspinal and other soft tissues: No unexpected soft tissue finding. Previous arterial stent procedures. IMPRESSION: Extension of the fusion from T10-L3. Surgical discontinuity of the posterior rods at the 3/4 level with old solid fusion from L4 to the sacrum. Chance type fracture at the L3 level extending in an axial plane through the vertebral body and posterior elements. No antero or retrolisthesis. Compression fracture of the L4 vertebral body at the superior aspect with loss of height of about 25%. Electronically Signed   By: Nelson Chimes M.D.   On: 12/06/2016 17:09   Scheduled Meds: . docusate sodium  100 mg Oral BID  . heparin  5,000 Units Subcutaneous Q8H  . sodium chloride flush  3 mL Intravenous Q12H   Continuous Infusions: . dextrose 5 % and 0.9% NaCl 100 mL/hr (12/07/16 2004)  . methocarbamol (ROBAXIN)  IV 500 mg (12/08/16 0515)  . piperacillin-tazobactam (ZOSYN)  IV 3.375 g (12/08/16 1512)    LOS: 2 days   Kerney Elbe, DO [facility] Pager [phone]  If 7PM-7AM, please contact  night-coverage [email or web address] Password [email or web address] 12/08/2016, 3:15 PM

## 2016-12-09 ENCOUNTER — Inpatient Hospital Stay (HOSPITAL_COMMUNITY): Payer: PPO | Admitting: Anesthesiology

## 2016-12-09 ENCOUNTER — Inpatient Hospital Stay (HOSPITAL_COMMUNITY): Payer: PPO

## 2016-12-09 ENCOUNTER — Inpatient Hospital Stay (HOSPITAL_COMMUNITY)
Admission: EM | Disposition: A | Discharge status: Rehabilitation Facility | Payer: Self-pay | Source: Home / Self Care | Attending: Neurosurgery

## 2016-12-09 ENCOUNTER — Encounter (HOSPITAL_COMMUNITY): Payer: Self-pay | Admitting: Anesthesiology

## 2016-12-09 DIAGNOSIS — J449 Chronic obstructive pulmonary disease, unspecified: Secondary | ICD-10-CM | POA: Diagnosis not present

## 2016-12-09 DIAGNOSIS — S32000A Wedge compression fracture of unspecified lumbar vertebra, initial encounter for closed fracture: Secondary | ICD-10-CM | POA: Insufficient documentation

## 2016-12-09 DIAGNOSIS — M549 Dorsalgia, unspecified: Secondary | ICD-10-CM | POA: Diagnosis not present

## 2016-12-09 DIAGNOSIS — S32030A Wedge compression fracture of third lumbar vertebra, initial encounter for closed fracture: Secondary | ICD-10-CM | POA: Diagnosis not present

## 2016-12-09 DIAGNOSIS — S32040A Wedge compression fracture of fourth lumbar vertebra, initial encounter for closed fracture: Secondary | ICD-10-CM | POA: Diagnosis not present

## 2016-12-09 HISTORY — PX: KYPHOPLASTY: SHX5884

## 2016-12-09 LAB — FERRITIN: FERRITIN: 199 ng/mL (ref 24–336)

## 2016-12-09 LAB — CBC WITH DIFFERENTIAL/PLATELET
BASOS PCT: 0 %
Basophils Absolute: 0 10*3/uL (ref 0.0–0.1)
EOS ABS: 0.2 10*3/uL (ref 0.0–0.7)
Eosinophils Relative: 2 %
HCT: 31.9 % — ABNORMAL LOW (ref 39.0–52.0)
Hemoglobin: 10.3 g/dL — ABNORMAL LOW (ref 13.0–17.0)
Lymphocytes Relative: 28 %
Lymphs Abs: 2.7 10*3/uL (ref 0.7–4.0)
MCH: 30.7 pg (ref 26.0–34.0)
MCHC: 32.3 g/dL (ref 30.0–36.0)
MCV: 94.9 fL (ref 78.0–100.0)
MONO ABS: 1 10*3/uL (ref 0.1–1.0)
MONOS PCT: 11 %
Neutro Abs: 5.8 10*3/uL (ref 1.7–7.7)
Neutrophils Relative %: 59 %
Platelets: 452 10*3/uL — ABNORMAL HIGH (ref 150–400)
RBC: 3.36 MIL/uL — ABNORMAL LOW (ref 4.22–5.81)
RDW: 14.2 % (ref 11.5–15.5)
WBC: 9.8 10*3/uL (ref 4.0–10.5)

## 2016-12-09 LAB — COMPREHENSIVE METABOLIC PANEL
ALK PHOS: 95 U/L (ref 38–126)
ALT: 31 U/L (ref 17–63)
ANION GAP: 10 (ref 5–15)
AST: 30 U/L (ref 15–41)
Albumin: 2.6 g/dL — ABNORMAL LOW (ref 3.5–5.0)
BUN: 7 mg/dL (ref 6–20)
CALCIUM: 8.5 mg/dL — AB (ref 8.9–10.3)
CO2: 26 mmol/L (ref 22–32)
CREATININE: 0.91 mg/dL (ref 0.61–1.24)
Chloride: 103 mmol/L (ref 101–111)
Glucose, Bld: 89 mg/dL (ref 65–99)
Potassium: 3 mmol/L — ABNORMAL LOW (ref 3.5–5.1)
SODIUM: 139 mmol/L (ref 135–145)
TOTAL PROTEIN: 5.7 g/dL — AB (ref 6.5–8.1)
Total Bilirubin: 0.6 mg/dL (ref 0.3–1.2)

## 2016-12-09 LAB — IRON AND TIBC
Iron: 52 ug/dL (ref 45–182)
SATURATION RATIOS: 24 % (ref 17.9–39.5)
TIBC: 213 ug/dL — ABNORMAL LOW (ref 250–450)
UIBC: 161 ug/dL

## 2016-12-09 LAB — VITAMIN B12: Vitamin B-12: 104 pg/mL — ABNORMAL LOW (ref 180–914)

## 2016-12-09 LAB — FOLATE: FOLATE: 18 ng/mL (ref >5.9)

## 2016-12-09 LAB — PHOSPHORUS: PHOSPHORUS: 3.8 mg/dL (ref 2.5–4.6)

## 2016-12-09 LAB — SURGICAL PCR SCREEN
MRSA, PCR: NEGATIVE
Staphylococcus aureus: NEGATIVE

## 2016-12-09 LAB — PROCALCITONIN

## 2016-12-09 LAB — RETICULOCYTES
RBC.: 3.36 MIL/uL — AB (ref 4.22–5.81)
RETIC COUNT ABSOLUTE: 37 10*3/uL (ref 19.0–186.0)
RETIC CT PCT: 1.1 % (ref 0.4–3.1)

## 2016-12-09 LAB — MAGNESIUM: MAGNESIUM: 1.9 mg/dL (ref 1.7–2.4)

## 2016-12-09 LAB — LACTIC ACID, PLASMA: Lactic Acid, Venous: 0.7 mmol/L (ref 0.5–1.9)

## 2016-12-09 SURGERY — POSTERIOR LUMBAR FUSION 1 LEVEL
Anesthesia: General | Site: Back

## 2016-12-09 MED ORDER — 0.9 % SODIUM CHLORIDE (POUR BTL) OPTIME
TOPICAL | Status: DC | PRN
  Administered 2016-12-09: 1000 mL

## 2016-12-09 MED ORDER — LIDOCAINE 2% (20 MG/ML) 5 ML SYRINGE
INTRAMUSCULAR | Status: AC
  Filled 2016-12-09: qty 5

## 2016-12-09 MED ORDER — ONDANSETRON HCL 4 MG PO TABS: 4.0000 mg | ORAL_TABLET | Freq: Four times a day (QID) | ORAL | Status: DC | PRN

## 2016-12-09 MED ORDER — ACETAMINOPHEN 650 MG RE SUPP: 650.0000 mg | RECTAL | Status: DC | PRN

## 2016-12-09 MED ORDER — MENTHOL 3 MG MT LOZG: 1.0000 | LOZENGE | OROMUCOSAL | Status: DC | PRN

## 2016-12-09 MED ORDER — SODIUM CHLORIDE 0.9 % IR SOLN
Status: DC | PRN
  Administered 2016-12-09: 17:00:00

## 2016-12-09 MED ORDER — ONDANSETRON HCL 4 MG/2ML IJ SOLN: 4.0000 mg | Freq: Four times a day (QID) | INTRAMUSCULAR | Status: DC | PRN

## 2016-12-09 MED ORDER — POTASSIUM CHLORIDE CRYS ER 20 MEQ PO TBCR
40.0000 meq | EXTENDED_RELEASE_TABLET | ORAL | Status: AC
  Administered 2016-12-09: 40 meq via ORAL
  Filled 2016-12-09: qty 2

## 2016-12-09 MED ORDER — HYDROMORPHONE HCL 1 MG/ML IJ SOLN
0.2500 mg | INTRAMUSCULAR | Status: DC | PRN
  Administered 2016-12-09 (×3): 0.5 mg via INTRAVENOUS

## 2016-12-09 MED ORDER — FENTANYL CITRATE (PF) 250 MCG/5ML IJ SOLN
INTRAMUSCULAR | Status: AC
Start: 2016-12-09 — End: ?
  Filled 2016-12-09: qty 5

## 2016-12-09 MED ORDER — IOPAMIDOL (ISOVUE-300) INJECTION 61%
INTRAVENOUS | Status: DC | PRN
  Administered 2016-12-09: 50 mL via INTRAVENOUS

## 2016-12-09 MED ORDER — SODIUM CHLORIDE 0.9% FLUSH
3.0000 mL | Freq: Two times a day (BID) | INTRAVENOUS | Status: DC
  Administered 2016-12-10: 10 mL via INTRAVENOUS
  Administered 2016-12-11 – 2016-12-13 (×4): 3 mL via INTRAVENOUS

## 2016-12-09 MED ORDER — IOPAMIDOL (ISOVUE-300) INJECTION 61%
INTRAVENOUS | Status: AC
  Filled 2016-12-09: qty 50

## 2016-12-09 MED ORDER — SUCCINYLCHOLINE CHLORIDE 20 MG/ML IJ SOLN
INTRAMUSCULAR | Status: DC | PRN
  Administered 2016-12-09: 100 mg via INTRAVENOUS

## 2016-12-09 MED ORDER — PROPOFOL 10 MG/ML IV BOLUS
INTRAVENOUS | Status: DC | PRN
  Administered 2016-12-09: 150 mg via INTRAVENOUS

## 2016-12-09 MED ORDER — THROMBIN 5000 UNITS EX SOLR
CUTANEOUS | Status: AC
  Filled 2016-12-09: qty 10000

## 2016-12-09 MED ORDER — BUPIVACAINE HCL (PF) 0.5 % IJ SOLN
INTRAMUSCULAR | Status: AC
  Filled 2016-12-09: qty 30

## 2016-12-09 MED ORDER — SODIUM CHLORIDE 0.9 % IV SOLN
250.0000 mL | INTRAVENOUS | Status: DC
  Administered 2016-12-10 – 2016-12-11 (×2): 250 mL via INTRAVENOUS

## 2016-12-09 MED ORDER — PHENYLEPHRINE HCL 10 MG/ML IJ SOLN: INTRAVENOUS | Status: DC | PRN

## 2016-12-09 MED ORDER — HYDROMORPHONE HCL 1 MG/ML IJ SOLN
INTRAMUSCULAR | Status: AC
  Filled 2016-12-09: qty 1

## 2016-12-09 MED ORDER — ROCURONIUM BROMIDE 100 MG/10ML IV SOLN
INTRAVENOUS | Status: DC | PRN
  Administered 2016-12-09: 20 mg via INTRAVENOUS
  Administered 2016-12-09: 30 mg via INTRAVENOUS
  Administered 2016-12-09: 10 mg via INTRAVENOUS

## 2016-12-09 MED ORDER — SODIUM CHLORIDE 0.9% FLUSH: 3.0000 mL | INTRAVENOUS | Status: DC | PRN

## 2016-12-09 MED ORDER — OXYCODONE HCL 5 MG PO TABS
5.0000 mg | ORAL_TABLET | ORAL | Status: DC | PRN
  Administered 2016-12-10 – 2016-12-14 (×11): 10 mg via ORAL
  Administered 2016-12-14: 5 mg via ORAL
  Filled 2016-12-09 (×11): qty 2
  Filled 2016-12-09: qty 1

## 2016-12-09 MED ORDER — BUPIVACAINE LIPOSOME 1.3 % IJ SUSP
20.0000 mL | Freq: Once | INTRAMUSCULAR | Status: DC
  Filled 2016-12-09: qty 20

## 2016-12-09 MED ORDER — ONDANSETRON HCL 4 MG/2ML IJ SOLN
INTRAMUSCULAR | Status: DC | PRN
  Administered 2016-12-09: 4 mg via INTRAVENOUS

## 2016-12-09 MED ORDER — SUGAMMADEX SODIUM 200 MG/2ML IV SOLN
INTRAVENOUS | Status: DC | PRN
  Administered 2016-12-09: 200 mg via INTRAVENOUS

## 2016-12-09 MED ORDER — VANCOMYCIN HCL 1000 MG IV SOLR
INTRAVENOUS | Status: AC
  Filled 2016-12-09: qty 1000

## 2016-12-09 MED ORDER — LACTATED RINGERS IV SOLN
INTRAVENOUS | Status: DC | PRN
  Administered 2016-12-09 (×3): via INTRAVENOUS

## 2016-12-09 MED ORDER — THROMBIN 5000 UNITS EX SOLR
OROMUCOSAL | Status: DC | PRN
  Administered 2016-12-09: 5 mL via TOPICAL
  Administered 2016-12-09: 19:00:00 via TOPICAL

## 2016-12-09 MED ORDER — MORPHINE SULFATE (PF) 4 MG/ML IV SOLN
4.0000 mg | INTRAVENOUS | Status: DC | PRN
  Administered 2016-12-10 – 2016-12-12 (×4): 4 mg via INTRAVENOUS
  Filled 2016-12-09 (×4): qty 1

## 2016-12-09 MED ORDER — LACTATED RINGERS IV SOLN
INTRAVENOUS | Status: DC
  Administered 2016-12-09: 14:00:00 via INTRAVENOUS

## 2016-12-09 MED ORDER — ACETAMINOPHEN 325 MG PO TABS
650.0000 mg | ORAL_TABLET | ORAL | Status: DC | PRN
  Administered 2016-12-12 (×3): 650 mg via ORAL
  Filled 2016-12-09 (×3): qty 2

## 2016-12-09 MED ORDER — VANCOMYCIN HCL 1000 MG IV SOLR
INTRAVENOUS | Status: DC | PRN
  Administered 2016-12-09: 1000 mg

## 2016-12-09 MED ORDER — THROMBIN 20000 UNITS EX KIT
PACK | CUTANEOUS | Status: DC | PRN
  Administered 2016-12-09: 20000 [IU] via TOPICAL

## 2016-12-09 MED ORDER — THROMBIN 5000 UNITS EX SOLR
CUTANEOUS | Status: AC
  Filled 2016-12-09: qty 5000

## 2016-12-09 MED ORDER — CYCLOBENZAPRINE HCL 10 MG PO TABS
10.0000 mg | ORAL_TABLET | Freq: Three times a day (TID) | ORAL | Status: DC | PRN
  Administered 2016-12-10 – 2016-12-13 (×3): 10 mg via ORAL
  Filled 2016-12-09 (×3): qty 1

## 2016-12-09 MED ORDER — BISACODYL 10 MG RE SUPP: 10.0000 mg | Freq: Every day | RECTAL | Status: DC | PRN

## 2016-12-09 MED ORDER — THROMBIN 20000 UNITS EX SOLR
CUTANEOUS | Status: AC
  Filled 2016-12-09: qty 20000

## 2016-12-09 MED ORDER — PHENYLEPHRINE HCL 10 MG/ML IJ SOLN
INTRAMUSCULAR | Status: DC | PRN
  Administered 2016-12-09: 25 ug/min via INTRAVENOUS

## 2016-12-09 MED ORDER — LIDOCAINE HCL (CARDIAC) 20 MG/ML IV SOLN
INTRAVENOUS | Status: DC | PRN
  Administered 2016-12-09: 50 mg via INTRAVENOUS

## 2016-12-09 MED ORDER — BACITRACIN ZINC 500 UNIT/GM EX OINT
TOPICAL_OINTMENT | CUTANEOUS | Status: AC
  Filled 2016-12-09: qty 28.35

## 2016-12-09 MED ORDER — PHENOL 1.4 % MT LIQD: 1.0000 | OROMUCOSAL | Status: DC | PRN

## 2016-12-09 MED ORDER — HEMOSTATIC AGENTS (NO CHARGE) OPTIME
TOPICAL | Status: DC | PRN
  Administered 2016-12-09: 1 via TOPICAL

## 2016-12-09 MED ORDER — PROPOFOL 10 MG/ML IV BOLUS
INTRAVENOUS | Status: AC
  Filled 2016-12-09: qty 20

## 2016-12-09 MED ORDER — BUPIVACAINE LIPOSOME 1.3 % IJ SUSP
INTRAMUSCULAR | Status: DC | PRN
  Administered 2016-12-09: 20 mL

## 2016-12-09 MED ORDER — ALBUMIN HUMAN 5 % IV SOLN
INTRAVENOUS | Status: DC | PRN
  Administered 2016-12-09: 18:00:00 via INTRAVENOUS

## 2016-12-09 MED ORDER — BUPIVACAINE-EPINEPHRINE 0.5% -1:200000 IJ SOLN
INTRAMUSCULAR | Status: DC | PRN
  Administered 2016-12-09: 10 mL

## 2016-12-09 MED ORDER — GADOBENATE DIMEGLUMINE 529 MG/ML IV SOLN
20.0000 mL | Freq: Once | INTRAVENOUS | Status: AC | PRN
  Administered 2016-12-09: 20 mL via INTRAVENOUS

## 2016-12-09 MED ORDER — CEFAZOLIN SODIUM-DEXTROSE 2-4 GM/100ML-% IV SOLN: 2.0000 g | Freq: Three times a day (TID) | INTRAVENOUS | Status: DC

## 2016-12-09 MED ORDER — BACITRACIN ZINC 500 UNIT/GM EX OINT
TOPICAL_OINTMENT | CUTANEOUS | Status: DC | PRN
  Administered 2016-12-09: 1 via TOPICAL

## 2016-12-09 MED ORDER — DOCUSATE SODIUM 100 MG PO CAPS
100.0000 mg | ORAL_CAPSULE | Freq: Two times a day (BID) | ORAL | Status: DC
  Administered 2016-12-10 – 2016-12-14 (×7): 100 mg via ORAL
  Filled 2016-12-09 (×7): qty 1

## 2016-12-09 MED ORDER — FENTANYL CITRATE (PF) 100 MCG/2ML IJ SOLN
INTRAMUSCULAR | Status: DC | PRN
  Administered 2016-12-09: 50 ug via INTRAVENOUS
  Administered 2016-12-09: 100 ug via INTRAVENOUS
  Administered 2016-12-09 (×2): 50 ug via INTRAVENOUS

## 2016-12-09 SURGICAL SUPPLY — 92 items
APL SKNCLS STERI-STRIP NONHPOA (GAUZE/BANDAGES/DRESSINGS) ×1
BAG DECANTER FOR FLEXI CONT (MISCELLANEOUS) ×3 IMPLANT
BENZOIN TINCTURE PRP APPL 2/3 (GAUZE/BANDAGES/DRESSINGS) ×4 IMPLANT
BLADE CLIPPER SURG (BLADE) IMPLANT
BLADE SURG 15 STRL LF DISP TIS (BLADE) ×1 IMPLANT
BLADE SURG 15 STRL SS (BLADE) ×3
BLOCKER XIA (Orthopedic Implant) IMPLANT
BLOCKERS XIA (Orthopedic Implant) ×36 IMPLANT
BUR MATCHSTICK NEURO 3.0 LAGG (BURR) ×3 IMPLANT
BUR PRECISION FLUTE 6.0 (BURR) ×3 IMPLANT
CANISTER SUCT 3000ML PPV (MISCELLANEOUS) ×3 IMPLANT
CARTRIDGE OIL MAESTRO DRILL (MISCELLANEOUS) ×2 IMPLANT
CEMENT BONE KYPHX HV R (Orthopedic Implant) ×4 IMPLANT
CEMENT KYPHON C01A KIT/MIXER (Cement) ×4 IMPLANT
CLOSURE WOUND 1/2 X4 (GAUZE/BANDAGES/DRESSINGS)
CONNECT SPINAL 34MM CROSS FIX (Connector) ×3 IMPLANT
CONNECTOR ROD ANGLED (Orthopedic Implant) ×8 IMPLANT
CONNECTOR SPINAL 34MM CRS FIX (Connector) IMPLANT
CONT SPEC 4OZ CLIKSEAL STRL BL (MISCELLANEOUS) ×5 IMPLANT
COVER BACK TABLE 60X90IN (DRAPES) ×6 IMPLANT
DEVICE BIOPSY BONE KYPHX (INSTRUMENTS) ×6 IMPLANT
DIFFUSER DRILL AIR PNEUMATIC (MISCELLANEOUS) ×4 IMPLANT
DRAPE C-ARM 42X72 X-RAY (DRAPES) ×7 IMPLANT
DRAPE C-ARMOR (DRAPES) ×2 IMPLANT
DRAPE HALF SHEET 40X57 (DRAPES) ×6 IMPLANT
DRAPE INCISE IOBAN 66X45 STRL (DRAPES) ×3 IMPLANT
DRAPE LAPAROTOMY 100X72X124 (DRAPES) ×4 IMPLANT
DRAPE POUCH INSTRU U-SHP 10X18 (DRAPES) ×3 IMPLANT
DRAPE SURG 17X23 STRL (DRAPES) ×24 IMPLANT
DRAPE WARM FLUID 44X44 (DRAPE) ×3 IMPLANT
ELECT BLADE 4.0 EZ CLEAN MEGAD (MISCELLANEOUS) ×3
ELECT CAUTERY BLADE 6.4 (BLADE) ×2 IMPLANT
ELECT REM PT RETURN 9FT ADLT (ELECTROSURGICAL) ×3
ELECTRODE BLDE 4.0 EZ CLN MEGD (MISCELLANEOUS) ×1 IMPLANT
ELECTRODE REM PT RTRN 9FT ADLT (ELECTROSURGICAL) ×1 IMPLANT
EVACUATOR 1/8 PVC DRAIN (DRAIN) IMPLANT
GAUZE SPONGE 4X4 12PLY STRL (GAUZE/BANDAGES/DRESSINGS) ×6 IMPLANT
GAUZE SPONGE 4X4 16PLY XRAY LF (GAUZE/BANDAGES/DRESSINGS) ×2 IMPLANT
GLOVE BIO SURGEON STRL SZ8 (GLOVE) ×9 IMPLANT
GLOVE BIO SURGEON STRL SZ8.5 (GLOVE) ×9 IMPLANT
GLOVE BIOGEL PI IND STRL 7.5 (GLOVE) IMPLANT
GLOVE BIOGEL PI INDICATOR 7.5 (GLOVE) ×2
GLOVE EXAM NITRILE LRG STRL (GLOVE) IMPLANT
GLOVE EXAM NITRILE XL STR (GLOVE) IMPLANT
GLOVE EXAM NITRILE XS STR PU (GLOVE) IMPLANT
GLOVE SURG SS PI 7.5 STRL IVOR (GLOVE) ×2 IMPLANT
GOWN STRL REUS W/ TWL LRG LVL3 (GOWN DISPOSABLE) IMPLANT
GOWN STRL REUS W/ TWL XL LVL3 (GOWN DISPOSABLE) ×2 IMPLANT
GOWN STRL REUS W/TWL 2XL LVL3 (GOWN DISPOSABLE) IMPLANT
GOWN STRL REUS W/TWL LRG LVL3 (GOWN DISPOSABLE) ×6
GOWN STRL REUS W/TWL XL LVL3 (GOWN DISPOSABLE) ×6
HEMOSTAT POWDER KIT SURGIFOAM (HEMOSTASIS) ×2 IMPLANT
KIT BASIN OR (CUSTOM PROCEDURE TRAY) ×4 IMPLANT
KIT INFUSE SMALL (Orthopedic Implant) ×2 IMPLANT
KIT ROOM TURNOVER OR (KITS) ×4 IMPLANT
NDL HYPO 21X1.5 SAFETY (NEEDLE) IMPLANT
NEEDLE HYPO 21X1.5 SAFETY (NEEDLE) ×3 IMPLANT
NEEDLE HYPO 22GX1.5 SAFETY (NEEDLE) ×6 IMPLANT
NS IRRIG 1000ML POUR BTL (IV SOLUTION) ×12 IMPLANT
OIL CARTRIDGE MAESTRO DRILL (MISCELLANEOUS) ×6
PACK EENT II TURBAN DRAPE (CUSTOM PROCEDURE TRAY) ×3 IMPLANT
PACK LAMINECTOMY NEURO (CUSTOM PROCEDURE TRAY) ×3 IMPLANT
PAD ARMBOARD 7.5X6 YLW CONV (MISCELLANEOUS) ×18 IMPLANT
PATTIES SURGICAL .5 X1 (DISPOSABLE) IMPLANT
PATTIES SURGICAL 1X1 (DISPOSABLE) ×2 IMPLANT
PEN RRC XIA TOP LOAD SIDE LOAD (Orthopedic Implant) ×4 IMPLANT
ROD 120MM (Rod) ×3 IMPLANT
ROD RADIUS NO-HEX 80MM (Rod) ×2 IMPLANT
ROD SPNL 120X5.5 NS TI RDS (Rod) IMPLANT
SPECIMEN JAR SMALL (MISCELLANEOUS) IMPLANT
SPONGE LAP 4X18 X RAY DECT (DISPOSABLE) IMPLANT
SPONGE NEURO XRAY DETECT 1X3 (DISPOSABLE) ×2 IMPLANT
SPONGE SURGIFOAM ABS GEL 100 (HEMOSTASIS) ×3 IMPLANT
STRIP BIOACTIVE 10CC 25X100X4 (Miscellaneous) ×2 IMPLANT
STRIP CLOSURE SKIN 1/2X4 (GAUZE/BANDAGES/DRESSINGS) ×2 IMPLANT
STRIP SURGICAL 1/2 X 6 IN (GAUZE/BANDAGES/DRESSINGS) ×2 IMPLANT
SUT PROLENE 6 0 BV (SUTURE) ×2 IMPLANT
SUT VIC AB 0 CT1 18XCR BRD8 (SUTURE) IMPLANT
SUT VIC AB 0 CT1 8-18 (SUTURE) ×9
SUT VIC AB 1 CT1 18XBRD ANBCTR (SUTURE) ×2 IMPLANT
SUT VIC AB 1 CT1 8-18 (SUTURE)
SUT VIC AB 2-0 CP2 18 (SUTURE) ×6 IMPLANT
SUT VIC AB 2-0 CT1 18 (SUTURE) ×2 IMPLANT
SUT VIC AB 3-0 SH 8-18 (SUTURE) ×3 IMPLANT
SYR CONTROL 10ML LL (SYRINGE) ×3 IMPLANT
SYRINGE 20CC LL (MISCELLANEOUS) ×2 IMPLANT
TAPE CLOTH SURG 4X10 WHT LF (GAUZE/BANDAGES/DRESSINGS) ×2 IMPLANT
TOWEL GREEN STERILE (TOWEL DISPOSABLE) ×6 IMPLANT
TOWEL GREEN STERILE FF (TOWEL DISPOSABLE) ×6 IMPLANT
TRAY FOLEY W/METER SILVER 16FR (SET/KITS/TRAYS/PACK) ×3 IMPLANT
TRAY KYPHOPAK 20/3 ONESTEP 1ST (MISCELLANEOUS) ×2 IMPLANT
WATER STERILE IRR 1000ML POUR (IV SOLUTION) ×3 IMPLANT

## 2016-12-09 NOTE — Op Note (Signed)
Brief history: The patient is a 71 year old white male who has had multiple previous lumbar fusions.Recently I extended his lumbar fusion up to T10 about 3 months ago. The patient initially did very well but has developed severe back pain. He was admitted for further workup. A CAT scan demonstrated findings consistent with a lumbar fracture at L3 and L4. A lumbar MRI demonstrated similar findings. I discussed the various treatment options with the patient and his wife including surgery. They have weighed the risks, benefits, and alternatives surgery and decided to proceed with an exploration of lumbar fusion with a redo lumbar fusion and instrumentation with kyphoplasty.  Preoperative diagnosis L3 and L4 Chance fracture; lumbago;  Postop diagnosis: The same  Procedure: Exploration of lumbar fusion; open internal fixation of L3 and L4 Chance fracture's; open L3 and L4 kyphoplasty; posterior segmental instrumentation from L2-L5; redo posterior lateral arthrodesis L2-3, L3-4, L4-5 and L5-S1 with local morselized autograft bone, bone morphogenic protein-soaked collagen sponges, and Bone graft extender  Surgeon: Dr. Earle Gell  Assistant: Dr. Kary Kos  Anesthesia: Gen. tracheal  Estimated blood loss: 300 mL  Specimens: Wound cultures  Drains: None  Complications: None  Description of procedure: The patient was brought to the operating room by the anesthesia team. General endotracheal anesthesia was induced. The patient was turned to the prone position on the Wilson frame. His thoraco lumbar region was then repaired with Betadine scrub and Betadine solution. Sterile drapes were applied. I then injected the area to be incised with Marcaine with epinephrine solution. I used a scalpel to make a linear midline incision from approximately L2 to the sacrum. I used electro cautery to perform a bilateral subperiosteal dissection closing the spinous process lamina and instrumentation from L2 to the sacrum.  We placed the versatrack for exposure. Because of the concern of a possible wound infection, I obtained wound cultures although I did not see any evidence of an infection.  I then used a high-speed drill to widen the patient's prior L3-4 and L4-5 laminotomies. I dissected through the epidural scar tissue exposing the thecal sac and the L3 and L4 nerve roots bilaterally. Under fluoroscopic guidance I cannulated the patient's L3 and L4 vertebral bodies bilaterally, just caudal to the L3 and L4 nerve roots with the Jamshidi needle. Under fluoroscopic guidance I injected bone cement into the vertebral bodies at L3 and L4. We then removed the bone needle.  We now turned attention to the instrumentation. Used open rod connectors to place two rods on the right and one on the left connecting the upper and lower rods from L2-L5. We tightened the connectors appropriately. This completed instrumentation.  We now turned our attention to the posterior lateral arthrodesis. We used electro cautery to expose the facets and transverse process at L2-3, L3-4, L4-5 and L5-S1. We then used a high-speed drill to decorticate these bony structures. We laid a combination of local morselized autograft bone we obtained during the decompression, bone morphogenic protein-soaked collagen sponges, and Kinnex Bone graft extender over these decorticated posterior lateral structures completing the redo posterior lateral arthrodesis at L2-3, L3-4, L4-5 and L5-S1.  We then obtained hemostasis using bipolar cautery. We irrigated the wound out with bacitracin solution. We removed the  retractor. We placed Experel in the wound. We placed vancomycin powder in the wound. We then reapproximated the patient's thoraco lumbar fascia with interrupted #1 Vicryl suture. We reapproximated the subcutaneous tissue with interrupted 2-0 Vicryl suture. We reapproximated the skin with Steri-Strips and benzoin. The  wound was then coated with bacitracin ointment.  A sterile dressing was applied. The drapes were removed. The patient was returned to the supine position. By report all sponge, instrument, and needle counts were correct at the end this case.

## 2016-12-09 NOTE — Progress Notes (Signed)
Patient ID: Derek [name], male   DOB: 02/15/1946, 71 y.o.   MRN: [id number] Subjective:  The patient is alert and pleasant. He continues to complain of severe mid lumbar.  Objective: Vital signs in last 24 hours: Temp:  [97.6 F (36.4 C)-98.7 F (37.1 C)] 98.7 F (37.1 C) (08/22 0444) Pulse Rate:  [85-94] 90 (08/22 0938) Resp:  [18-19] 19 (08/22 0938) BP: (125-158)/(57-69) 125/58 (08/22 0938) SpO2:  [97 %-100 %] 99 % (08/22 0938)  Intake/Output from previous day: 08/21 0701 - 08/22 0700 In: 1518.4 [I.V.:900; IV Piggyback:618.4] Out: 400 [Urine:400] Intake/Output this shift: No intake/output data recorded.  Physical exam the patient is alert and oriented. He is moving his lower extremities well. Patient has a flat affect but this is his baseline.  Lab Results:  Recent Labs  12/08/16 0921 12/09/16 0530  WBC 11.0* 9.8  HGB 10.4* 10.3*  HCT 32.1* 31.9*  PLT 435* 452*   BMET  Recent Labs  12/08/16 0921 12/09/16 0530  NA 137 139  K 3.5 3.0*  CL 102 103  CO2 27 26  GLUCOSE 95 89  BUN 7 7  CREATININE 0.95 0.91  CALCIUM 8.5* 8.5*    Studies/Results: Mr Thoracic Spine W Wo Contrast  Result Date: 12/09/2016 CLINICAL DATA:  Compression fracture and recent spinal surgery. EXAM: MRI THORACIC AND LUMBAR SPINE WITHOUT AND WITH CONTRAST TECHNIQUE: Multiplanar and multiecho pulse sequences of the thoracic and lumbar spine were obtained without and with intravenous contrast. CONTRAST:  23mL MULTIHANCE GADOBENATE DIMEGLUMINE 529 MG/ML IV SOLN COMPARISON:  Thoracic spine CT 12/06/2016 Lumbar spine MRI 06/07/16 FINDINGS: MRI THORACIC SPINE FINDINGS Alignment:  Physiologic. Vertebrae: There is posterior spinal fusion beginning at the T10 level and extending inferiorly to the sacrum. There are transpedicular screws at all levels. Cord:  Normal signal and morphology. Paraspinal and other soft tissues: Negative. Disc levels: No spinal canal or neural foraminal stenosis. MRI LUMBAR SPINE  FINDINGS Segmentation:  Standard. Alignment:  Physiologic. Vertebrae: As above, there is posterior spinal fixation hardware extending from the thoracic spine to the sacrum. There is loss of the normal T1 weighted signal within the L3 and L4 vertebral bodies. On postcontrast imaging, there is marked enhancement. There is a compression fracture of the L3 vertebral body with approximately 25% height loss. Assessment of the posterior elements is limited due to the susceptibility artifact from spinal hardware. There is a compression fracture at L4 with approximately 50% height loss centrally. Conus medullaris: Extends to the L1 level and appears normal. There is no discrete epidural abscess or fluid collection. Paraspinal and other soft tissues: There is abnormal T2 weighted signal and contrast enhancement throughout much of the medial right psoas musculature. No fluid collection. There is a posterior paraspinal fluid collection extending from L3-2 sacrum. There is surrounding contrast enhancement, nonspecific. There is fatty infiltration of the posterior paraspinous musculature. Disc levels: Substantial susceptibility artifact limits assessment of the spinal canal. There is no visualize lumbar spinal canal stenosis. The visible neural foramina are patent. IMPRESSION: 1. Compression fractures at L3 and L4, as described on recent lumbar spine CT. Loss of normal T1 weighted signal with associated contrast enhancement is concerning for osteomyelitis. In the context of recent compression fracture, these findings are less specific. Tissue sampling might be considered for confirmation. No epidural abscess. 2. Abnormal contrast enhancement and edema within the medial right psoas muscle may indicate myositis. No psoas abscess. 3. Posterior paraspinal fluid collection at the L3-S1 levels. There are no specific findings  to indicate that this collection is infected, but infection would be difficult to exclude. 4. T10-S1 posterior  spinal fusion without spinal canal stenosis. Electronically Signed   By: Ulyses Jarred M.D.   On: 12/09/2016 04:40   Mr Lumbar Spine W Wo Contrast  Result Date: 12/09/2016 CLINICAL DATA:  Compression fracture and recent spinal surgery. EXAM: MRI THORACIC AND LUMBAR SPINE WITHOUT AND WITH CONTRAST TECHNIQUE: Multiplanar and multiecho pulse sequences of the thoracic and lumbar spine were obtained without and with intravenous contrast. CONTRAST:  106mL MULTIHANCE GADOBENATE DIMEGLUMINE 529 MG/ML IV SOLN COMPARISON:  Thoracic spine CT 12/06/2016 Lumbar spine MRI 06/07/16 FINDINGS: MRI THORACIC SPINE FINDINGS Alignment:  Physiologic. Vertebrae: There is posterior spinal fusion beginning at the T10 level and extending inferiorly to the sacrum. There are transpedicular screws at all levels. Cord:  Normal signal and morphology. Paraspinal and other soft tissues: Negative. Disc levels: No spinal canal or neural foraminal stenosis. MRI LUMBAR SPINE FINDINGS Segmentation:  Standard. Alignment:  Physiologic. Vertebrae: As above, there is posterior spinal fixation hardware extending from the thoracic spine to the sacrum. There is loss of the normal T1 weighted signal within the L3 and L4 vertebral bodies. On postcontrast imaging, there is marked enhancement. There is a compression fracture of the L3 vertebral body with approximately 25% height loss. Assessment of the posterior elements is limited due to the susceptibility artifact from spinal hardware. There is a compression fracture at L4 with approximately 50% height loss centrally. Conus medullaris: Extends to the L1 level and appears normal. There is no discrete epidural abscess or fluid collection. Paraspinal and other soft tissues: There is abnormal T2 weighted signal and contrast enhancement throughout much of the medial right psoas musculature. No fluid collection. There is a posterior paraspinal fluid collection extending from L3-2 sacrum. There is surrounding  contrast enhancement, nonspecific. There is fatty infiltration of the posterior paraspinous musculature. Disc levels: Substantial susceptibility artifact limits assessment of the spinal canal. There is no visualize lumbar spinal canal stenosis. The visible neural foramina are patent. IMPRESSION: 1. Compression fractures at L3 and L4, as described on recent lumbar spine CT. Loss of normal T1 weighted signal with associated contrast enhancement is concerning for osteomyelitis. In the context of recent compression fracture, these findings are less specific. Tissue sampling might be considered for confirmation. No epidural abscess. 2. Abnormal contrast enhancement and edema within the medial right psoas muscle may indicate myositis. No psoas abscess. 3. Posterior paraspinal fluid collection at the L3-S1 levels. There are no specific findings to indicate that this collection is infected, but infection would be difficult to exclude. 4. T10-S1 posterior spinal fusion without spinal canal stenosis. Electronically Signed   By: Ulyses Jarred M.D.   On: 12/09/2016 04:40    Assessment/Plan: L3 and L4 fractures, lumbago: I have reviewed the patient's lumbar CT and MRI. He has unstable appearing L3 and L4 fractures likely secondary to the instrumented lever arms above and below his fracture and possibly some poor bone quality. I have discussed the situation with the patient and his wife. We have discussed the various treatment options. I have recommended that he undergo a revision of his thoracolumbar instrumentation and fusion and possibly vertebroplasty. I have described the surgery to them. We have discussed the risk of surgery including risks of anesthesia, hemorrhage, infection, spinal fluid leak, nerve injury, spinal cord injury, medical risks, instrumentation malplacement or malfunction, fusion failure, failure to relieve the pain, etc. I have answered all their questions. He wants to  proceed with surgery.  LOS: 3  days     Cyree Chuong D 12/09/2016, 12:57 PM

## 2016-12-09 NOTE — Anesthesia Preprocedure Evaluation (Addendum)
Anesthesia Evaluation  Patient identified by MRN, date of birth, ID band Patient awake    Reviewed: Allergy & Precautions, NPO status , Patient's Chart, lab work & pertinent test results  Airway Mallampati: II  TM Distance: >3 FB     Dental   Pulmonary shortness of breath, pneumonia, COPD, Current Smoker,    breath sounds clear to auscultation       Cardiovascular hypertension, + Peripheral Vascular Disease   Rhythm:Regular Rate:Normal     Neuro/Psych  Headaches,    GI/Hepatic negative GI ROS, Neg liver ROS,   Endo/Other  negative endocrine ROS  Renal/GU negative Renal ROS     Musculoskeletal  (+) Arthritis ,   Abdominal   Peds  Hematology  (+) anemia ,   Anesthesia Other Findings   Reproductive/Obstetrics                             Anesthesia Physical Anesthesia Plan  ASA: III  Anesthesia Plan: General   Post-op Pain Management:    Induction: Intravenous  PONV Risk Score and Plan: 1 and Ondansetron and Dexamethasone  Airway Management Planned: Oral ETT  Additional Equipment:   Intra-op Plan:   Post-operative Plan: Possible Post-op intubation/ventilation  Informed Consent: I have reviewed the patients History and Physical, chart, labs and discussed the procedure including the risks, benefits and alternatives for the proposed anesthesia with the patient or authorized representative who has indicated his/her understanding and acceptance.   Dental advisory given  Plan Discussed with: CRNA and Anesthesiologist  Anesthesia Plan Comments:         Anesthesia Quick Evaluation

## 2016-12-09 NOTE — Progress Notes (Signed)
Patient ID: Derek [name], male   DOB: Apr 01, 1946, 71 y.o.   MRN: [id number] Subjective:  The patient is alert. His back is quite sore.  Objective: Vital signs in last 24 hours: Temp:  [97.2 F (36.2 C)-98.7 F (37.1 C)] 97.2 F (36.2 C) (08/22 2215) Pulse Rate:  [80-94] 80 (08/22 2230) Resp:  [16-19] 19 (08/22 2230) BP: (125-152)/(57-87) 152/87 (08/22 2230) SpO2:  [97 %-100 %] 100 % (08/22 2230) Weight:  [99.3 kg (219 lb)] 99.3 kg (219 lb) (08/22 1428)  Intake/Output from previous day: 08/21 0701 - 08/22 0700 In: 1518.4 [I.V.:900; IV Piggyback:618.4] Out: 400 [Urine:400] Intake/Output this shift: Total I/O In: 1000 [I.V.:1000] Out: 625 [Urine:475; Blood:150]  Physical exam the patient is alert. He is moving his lower extremities well.  Lab Results:  Recent Labs  12/08/16 0921 12/09/16 0530  WBC 11.0* 9.8  HGB 10.4* 10.3*  HCT 32.1* 31.9*  PLT 435* 452*   BMET  Recent Labs  12/08/16 0921 12/09/16 0530  NA 137 139  K 3.5 3.0*  CL 102 103  CO2 27 26  GLUCOSE 95 89  BUN 7 7  CREATININE 0.95 0.91  CALCIUM 8.5* 8.5*    Studies/Results: Dg Lumbar Spine 2-3 Views  Result Date: 12/09/2016 CLINICAL DATA:  Status post lumbar fusion and vertebroplasty. EXAM: LUMBAR SPINE - 2-3 VIEW; DG C-ARM 61-120 MIN FLUOROSCOPY TIME:  50 seconds. COMPARISON:  Radiographs of September 23, 2016. FINDINGS: Two intraoperative fluoroscopic images of the lumbar spine demonstrate the patient be status post posterior intrapedicular screw placement as well as vertebroplasty at 2 levels. Good alignment of vertebral bodies is noted. IMPRESSION: Status post surgical posterior fusion and vertebroplasty of lumbar spine. Electronically Signed   By: Marijo Conception, M.D.   On: 12/09/2016 21:28   Mr Thoracic Spine W Wo Contrast  Result Date: 12/09/2016 CLINICAL DATA:  Compression fracture and recent spinal surgery. EXAM: MRI THORACIC AND LUMBAR SPINE WITHOUT AND WITH CONTRAST TECHNIQUE: Multiplanar and  multiecho pulse sequences of the thoracic and lumbar spine were obtained without and with intravenous contrast. CONTRAST:  92mL MULTIHANCE GADOBENATE DIMEGLUMINE 529 MG/ML IV SOLN COMPARISON:  Thoracic spine CT 12/06/2016 Lumbar spine MRI 06/07/16 FINDINGS: MRI THORACIC SPINE FINDINGS Alignment:  Physiologic. Vertebrae: There is posterior spinal fusion beginning at the T10 level and extending inferiorly to the sacrum. There are transpedicular screws at all levels. Cord:  Normal signal and morphology. Paraspinal and other soft tissues: Negative. Disc levels: No spinal canal or neural foraminal stenosis. MRI LUMBAR SPINE FINDINGS Segmentation:  Standard. Alignment:  Physiologic. Vertebrae: As above, there is posterior spinal fixation hardware extending from the thoracic spine to the sacrum. There is loss of the normal T1 weighted signal within the L3 and L4 vertebral bodies. On postcontrast imaging, there is marked enhancement. There is a compression fracture of the L3 vertebral body with approximately 25% height loss. Assessment of the posterior elements is limited due to the susceptibility artifact from spinal hardware. There is a compression fracture at L4 with approximately 50% height loss centrally. Conus medullaris: Extends to the L1 level and appears normal. There is no discrete epidural abscess or fluid collection. Paraspinal and other soft tissues: There is abnormal T2 weighted signal and contrast enhancement throughout much of the medial right psoas musculature. No fluid collection. There is a posterior paraspinal fluid collection extending from L3-2 sacrum. There is surrounding contrast enhancement, nonspecific. There is fatty infiltration of the posterior paraspinous musculature. Disc levels: Substantial susceptibility artifact limits assessment  of the spinal canal. There is no visualize lumbar spinal canal stenosis. The visible neural foramina are patent. IMPRESSION: 1. Compression fractures at L3 and L4,  as described on recent lumbar spine CT. Loss of normal T1 weighted signal with associated contrast enhancement is concerning for osteomyelitis. In the context of recent compression fracture, these findings are less specific. Tissue sampling might be considered for confirmation. No epidural abscess. 2. Abnormal contrast enhancement and edema within the medial right psoas muscle may indicate myositis. No psoas abscess. 3. Posterior paraspinal fluid collection at the L3-S1 levels. There are no specific findings to indicate that this collection is infected, but infection would be difficult to exclude. 4. T10-S1 posterior spinal fusion without spinal canal stenosis. Electronically Signed   By: Ulyses Jarred M.D.   On: 12/09/2016 04:40   Mr Lumbar Spine W Wo Contrast  Result Date: 12/09/2016 CLINICAL DATA:  Compression fracture and recent spinal surgery. EXAM: MRI THORACIC AND LUMBAR SPINE WITHOUT AND WITH CONTRAST TECHNIQUE: Multiplanar and multiecho pulse sequences of the thoracic and lumbar spine were obtained without and with intravenous contrast. CONTRAST:  25mL MULTIHANCE GADOBENATE DIMEGLUMINE 529 MG/ML IV SOLN COMPARISON:  Thoracic spine CT 12/06/2016 Lumbar spine MRI 06/07/16 FINDINGS: MRI THORACIC SPINE FINDINGS Alignment:  Physiologic. Vertebrae: There is posterior spinal fusion beginning at the T10 level and extending inferiorly to the sacrum. There are transpedicular screws at all levels. Cord:  Normal signal and morphology. Paraspinal and other soft tissues: Negative. Disc levels: No spinal canal or neural foraminal stenosis. MRI LUMBAR SPINE FINDINGS Segmentation:  Standard. Alignment:  Physiologic. Vertebrae: As above, there is posterior spinal fixation hardware extending from the thoracic spine to the sacrum. There is loss of the normal T1 weighted signal within the L3 and L4 vertebral bodies. On postcontrast imaging, there is marked enhancement. There is a compression fracture of the L3 vertebral body  with approximately 25% height loss. Assessment of the posterior elements is limited due to the susceptibility artifact from spinal hardware. There is a compression fracture at L4 with approximately 50% height loss centrally. Conus medullaris: Extends to the L1 level and appears normal. There is no discrete epidural abscess or fluid collection. Paraspinal and other soft tissues: There is abnormal T2 weighted signal and contrast enhancement throughout much of the medial right psoas musculature. No fluid collection. There is a posterior paraspinal fluid collection extending from L3-2 sacrum. There is surrounding contrast enhancement, nonspecific. There is fatty infiltration of the posterior paraspinous musculature. Disc levels: Substantial susceptibility artifact limits assessment of the spinal canal. There is no visualize lumbar spinal canal stenosis. The visible neural foramina are patent. IMPRESSION: 1. Compression fractures at L3 and L4, as described on recent lumbar spine CT. Loss of normal T1 weighted signal with associated contrast enhancement is concerning for osteomyelitis. In the context of recent compression fracture, these findings are less specific. Tissue sampling might be considered for confirmation. No epidural abscess. 2. Abnormal contrast enhancement and edema within the medial right psoas muscle may indicate myositis. No psoas abscess. 3. Posterior paraspinal fluid collection at the L3-S1 levels. There are no specific findings to indicate that this collection is infected, but infection would be difficult to exclude. 4. T10-S1 posterior spinal fusion without spinal canal stenosis. Electronically Signed   By: Ulyses Jarred M.D.   On: 12/09/2016 04:40   Dg C-arm 1-60 Min  Result Date: 12/09/2016 CLINICAL DATA:  Status post lumbar fusion and vertebroplasty. EXAM: LUMBAR SPINE - 2-3 VIEW; DG C-ARM 61-120 MIN  FLUOROSCOPY TIME:  50 seconds. COMPARISON:  Radiographs of September 23, 2016. FINDINGS: Two  intraoperative fluoroscopic images of the lumbar spine demonstrate the patient be status post posterior intrapedicular screw placement as well as vertebroplasty at 2 levels. Good alignment of vertebral bodies is noted. IMPRESSION: Status post surgical posterior fusion and vertebroplasty of lumbar spine. Electronically Signed   By: Marijo Conception, M.D.   On: 12/09/2016 21:28    Assessment/Plan: The patient is doing well. I spoke with his family.  LOS: 3 days     Yanessa Hocevar D 12/09/2016, 10:49 PM

## 2016-12-09 NOTE — Transfer of Care (Signed)
Immediate Anesthesia Transfer of Care Note  Patient: Derek [name]  Procedure(s) Performed: Procedure(s): REVISION LUMBAR FUSION (N/A) Vertebroplasty (N/A)  Patient Location: PACU  Anesthesia Type:General  Level of Consciousness: drowsy  Airway & Oxygen Therapy: Patient Spontanous Breathing and Patient connected to face mask oxygen  Post-op Assessment: Report given to RN and Post -op Vital signs reviewed and stable  Post vital signs: Reviewed and stable  Last Vitals:  Vitals:   12/09/16 1353 12/09/16 2215  BP: 130/84 (!) 148/71  Pulse: 88 80  Resp: 17 16  Temp: 37.1 C (!) 36.2 C  SpO2: 99% 100%    Last Pain:  Vitals:   12/09/16 1353  TempSrc: Oral  PainSc:       Patients Stated Pain Goal: 0 (89/21/19 4174)  Complications: No apparent anesthesia complications

## 2016-12-09 NOTE — Progress Notes (Signed)
PROGRESS NOTE    Derek [name]  TIR:[id number] DOB: [birth year] DOA: 12/06/2016 PCP: Redmond School, MD (Confirm with patient/family/NH records and if not entered, this HAS to be entered at [facility] point of entry. "No PCP" if truly none.)   Brief Narrative: (Start on day 1 of progress note - keep it brief and live) The patient is a 71 y.o.malewith hx of chronic pain syndrome, known to have spinal stenosis, neurogenic claudication, with back surgery about 2 months ago, brought to the ER with increase confusion/ worsening lower back pain. Work up at [employer] new lumbar compression fracture, tachycardic, leukocytosis, neurosurgery contacted and recommended MRI to be done and transferred to [facility] to rule out any infectious process. MRI ordered and pending and Neurosurgery evaluated and feel that patient will likely need surgical revision but awaiting MRI results to make further definitive treatment recommendations.   MRI with findings possibly suggestive of infection.  Pt to OR today with neurosurgery.   Assessment & Plan:   Active Problems:   Pain in limb   Avascular necrosis of bone of right hip (HCC)   Lumbar stenosis with neurogenic claudication   Sepsis (HCC)   Altered mental status   Hyponatremia   Hypokalemia   Lactic acidosis   Normocytic anemia   Anxiety   COPD (chronic obstructive pulmonary disease) (HCC)   Back pain   Compression fracture of lumbar vertebra (HCC)   Thrombocytosis (HCC)   Worsening lower back pain/Lumbago with a New Compression fractures by the CAT scan at L3 and L4 rule out Infection - MRI lumbar/thoracic spine with compression fx at L3/L4 (noted concern for osteo, but findings less specific with compression fracture).  Also with edema/enhancement within medial R psoas (consider myositis).  Posterior paraspinal fluid collection L3-S1.  T10-S1 posterior spinal fusion without stenosis.   -CT Thoracic Spine showed No acute, traumatic or unexpected  finding in the thoracic region. The patient has recently at thoracolumbar fusion. In the thoracic region, this includes pedicle screws and posterior rods at T10, T11 and T12 which have good appearances. No abnormality seen above that. Distant ACDF C6-7 -CT Lumbar Spine showed Extension of the fusion from T10-L3. Surgical discontinuity of the posterior rods at the 3/4 level with old solid fusion from L4 to the sacrum. Chance type fracture at the L3 level extending in an axial plane through the vertebral body and posterior elements. No antero or retrolisthesis. Compression fracture of the L4 vertebral body at the superior aspect with loss of height of about 25%. - Neurosurgery with low suspicion for infection, but discussed taking tissue samples/culture if possible with them (MRI results as noted above) - To OR today.  -Continue with IV antibiotics with IV Zosy; IV Vancomycin pharmacy to dose -Blood Cx x 2 show NGTD -Pain Control with Acetaminophen 650 mg po/RC q6hprn mild pain, Morphine 1-3 mg q4hprn Moderate Severe Pain, Muscle Rub Cream, Topically, and Robaxin 550 mg po Daily on MAR -Bowel Regimen with Docusate Sodium 100 mg po BID and Lactulose 10-20 grams po qHSprn for Moderate Constipation -C/w D5 NS at 100 mL/hr  Acute Encephalopathy with Increased Confusion, improved - continuing to improve today per wife, A&Ox3 this morning.  -Did not Take More than Required Pain Medication -PCP thought patient had Prostatitis and was started on Ciprofloxacin as an outpatient (negative UA on 8/19)  -CT Head showed No acute intracranial process. Sinus disease as above -Gabapentin and Diazepam Home medications currently held  -Continue to Treat Pain  -UDS  Positive for Benzos  -C/w IVF Rehydration as Above  ? Sepsis; ? Lumbar Infection -As above -Lactic Acid Level was 2.14; normal lactate and procalcitonin this AM -normal WBC today, downtrending -C/w IVF with D5W NS at 100 mL/hr -Continue with IV  antibiotics with IV Zosyn/vancomycin per pharmacy -Blood Cx x 2 show NGTD  Hypertension -Blood pressure is controlled -Avoid starting Home BP med of Lisinopril for now  Hx of COPD/Emphysema -Currently not decompensated -Continue to Monitor Respiratory Status and if necessary add Nebs  Anxiety -Takes Diazepam 10 mg 4 times Daily but currently held -Got 2.5 mg of Valium this AM and 5 mg of Valium last night -May need to restart Valium Slowly -Monitor for Withdrawals   Thrombocytosis - 452 today -Likely Reactive; ? Infection -Platelet Count went from 433 -> 426 -> 435 -> 452 -Continue to Monitor and Repeat CBC in AM  Hyponatremia -Improved with IVF Rehydration -Na+ went from 130 -> 134 -> 137 -> 139 -Continue to Monitor and Repeat CMP in AM  Hypokalemia - 3 today, replete -Continue to Monitor and Replete As Necessary -Repeat CMP in AM  Lactic Acidosis  -Lactic Acid Level on Admission was 2.14  - improved  Normocytic Anemia -Unknown Etiology so will obtain Anemia Panel - suggestive of AOCD, also with low B12, will plan to start b12 injections tomorrow after surgery -Hb/Hct went from 11.4/34.8 -> 10.9/32.6 -> 10.4/32.1; Likely Dilutional Drop -Continue to Monitor for S/Sx of Bleeding and Repeat CBC in AM    DVT prophylaxis: heparin Code Status: full  Family Communication: wife, pastor in room  Disposition Plan: pending surgery   Consultants:   Neurosurgery   Procedures: (Don't include imaging studies which can be auto populated. Include things that cannot be auto populated i.e. Echo, Carotid and venous dopplers, Foley, Bipap, HD, tubes/drains, wound vac, central lines etc)  To OR with Neurosurgery today  Antimicrobials: (specify start and planned stop date. Auto populated tables are space occupying and do not give end dates)  vanc/zosyn    Subjective: Sleepy.  Wife notes he was agitated last night, in a lot of pain.  Received morphine at 0930.    Objective: Vitals:   12/08/16 2100 12/09/16 0100 12/09/16 0444 12/09/16 0938  BP: (!) 136/59 133/69 (!) 149/57 (!) 125/58  Pulse: 86 87 94 90  Resp: 18 18 18 19   Temp: 97.6 F (36.4 C) 98.7 F (37.1 C) 98.7 F (37.1 C)   TempSrc: Oral Oral Oral   SpO2: 100% 97% 99% 99%  Weight:      Height:        Intake/Output Summary (Last 24 hours) at 12/09/16 1138 Last data filed at 12/09/16 0550  Gross per 24 hour  Intake          1518.38 ml  Output              400 ml  Net          1118.38 ml   Filed Weights   12/06/16 1254  Weight: 99.3 kg (219 lb)    Examination:  General exam: Appears calm and comfortable, very sleepy, hard to arouse, but minutes later awake and alert and requesting additional pain medication Respiratory system: Clear to auscultation. Respiratory effort normal. Cardiovascular system: S1 & S2 heard, RRR. No JVD, murmurs, rubs, gallops or clicks. No pedal edema. Gastrointestinal system: Abdomen is nondistended, soft and nontender. No organomegaly or masses felt. Normal bowel sounds heard. Central nervous system: Alert and oriented. No focal  neurological deficits. Extremities: no LEE Skin: scar over lumbar spine Psychiatry: Judgement and insight appear normal. Mood & affect appropriate.     Data Reviewed: I have personally reviewed following labs and imaging studies  CBC:  Recent Labs Lab 12/06/16 1324 12/07/16 0507 12/08/16 0921 12/09/16 0530  WBC 11.8* 10.7* 11.0* 9.8  NEUTROABS 8.1*  --  7.2 5.8  HGB 11.4* 10.9* 10.4* 10.3*  HCT 34.8* 32.6* 32.1* 31.9*  MCV 95.1 95.3 94.1 94.9  PLT 433* 426* 435* 270*   Basic Metabolic Panel:  Recent Labs Lab 12/06/16 1324 12/07/16 0507 12/08/16 0921 12/09/16 0530  NA 130* 134* 137 139  K 3.3* 3.4* 3.5 3.0*  CL 91* 100* 102 103  CO2 28 25 27 26   GLUCOSE 100* 115* 95 89  BUN 6 6 7 7   CREATININE 0.74 0.73 0.95 0.91  CALCIUM 8.7* 8.4* 8.5* 8.5*  MG  --   --  1.7 1.9  PHOS  --   --  3.6 3.8    GFR: Estimated Creatinine Clearance: 87.7 mL/min (by C-G formula based on SCr of 0.91 mg/dL). Liver Function Tests:  Recent Labs Lab 12/06/16 1324 12/08/16 0921 12/09/16 0530  AST 40 38 30  ALT 33 34 31  ALKPHOS 119 97 95  BILITOT 0.4 0.7 0.6  PROT 6.8 5.7* 5.7*  ALBUMIN 3.1* 2.6* 2.6*    Recent Labs Lab 12/06/16 1324  LIPASE 31   No results for input(s): AMMONIA in the last 168 hours. Coagulation Profile: No results for input(s): INR, PROTIME in the last 168 hours. Cardiac Enzymes: No results for input(s): CKTOTAL, CKMB, CKMBINDEX, TROPONINI in the last 168 hours. BNP (last 3 results) No results for input(s): PROBNP in the last 8760 hours. HbA1C: No results for input(s): HGBA1C in the last 72 hours. CBG:  Recent Labs Lab 12/06/16 1303  GLUCAP 95   Lipid Profile: No results for input(s): CHOL, HDL, LDLCALC, TRIG, CHOLHDL, LDLDIRECT in the last 72 hours. Thyroid Function Tests:  Recent Labs  12/07/16 0507  TSH 0.949   Anemia Panel:  Recent Labs  12/09/16 0530  VITAMINB12 104*  FOLATE 18.0  FERRITIN 199  TIBC 213*  IRON 52  RETICCTPCT 1.1   Sepsis Labs:  Recent Labs Lab 12/06/16 1400 12/09/16 0530  LATICACIDVEN 2.14* 0.7    Recent Results (from the past 240 hour(s))  Blood Culture (routine x 2)     Status: None (Preliminary result)   Collection Time: 12/06/16  1:36 PM  Result Value Ref Range Status   Specimen Description BLOOD RIGHT ANTECUBITAL  Final   Special Requests Blood Culture adequate volume  Final   Culture NO GROWTH 3 DAYS  Final   Report Status PENDING  Incomplete  Blood Culture (routine x 2)     Status: None (Preliminary result)   Collection Time: 12/06/16  1:43 PM  Result Value Ref Range Status   Specimen Description BLOOD LEFT ANTECUBITAL  Final   Special Requests Blood Culture adequate volume  Final   Culture NO GROWTH 3 DAYS  Final   Report Status PENDING  Incomplete         Radiology Studies: Mr Thoracic  Spine W Wo Contrast  Result Date: 12/09/2016 CLINICAL DATA:  Compression fracture and recent spinal surgery. EXAM: MRI THORACIC AND LUMBAR SPINE WITHOUT AND WITH CONTRAST TECHNIQUE: Multiplanar and multiecho pulse sequences of the thoracic and lumbar spine were obtained without and with intravenous contrast. CONTRAST:  51mL MULTIHANCE GADOBENATE DIMEGLUMINE 529 MG/ML IV SOLN COMPARISON:  Thoracic spine CT 12/06/2016 Lumbar spine MRI 06/07/16 FINDINGS: MRI THORACIC SPINE FINDINGS Alignment:  Physiologic. Vertebrae: There is posterior spinal fusion beginning at the T10 level and extending inferiorly to the sacrum. There are transpedicular screws at all levels. Cord:  Normal signal and morphology. Paraspinal and other soft tissues: Negative. Disc levels: No spinal canal or neural foraminal stenosis. MRI LUMBAR SPINE FINDINGS Segmentation:  Standard. Alignment:  Physiologic. Vertebrae: As above, there is posterior spinal fixation hardware extending from the thoracic spine to the sacrum. There is loss of the normal T1 weighted signal within the L3 and L4 vertebral bodies. On postcontrast imaging, there is marked enhancement. There is a compression fracture of the L3 vertebral body with approximately 25% height loss. Assessment of the posterior elements is limited due to the susceptibility artifact from spinal hardware. There is a compression fracture at L4 with approximately 50% height loss centrally. Conus medullaris: Extends to the L1 level and appears normal. There is no discrete epidural abscess or fluid collection. Paraspinal and other soft tissues: There is abnormal T2 weighted signal and contrast enhancement throughout much of the medial right psoas musculature. No fluid collection. There is a posterior paraspinal fluid collection extending from L3-2 sacrum. There is surrounding contrast enhancement, nonspecific. There is fatty infiltration of the posterior paraspinous musculature. Disc levels: Substantial  susceptibility artifact limits assessment of the spinal canal. There is no visualize lumbar spinal canal stenosis. The visible neural foramina are patent. IMPRESSION: 1. Compression fractures at L3 and L4, as described on recent lumbar spine CT. Loss of normal T1 weighted signal with associated contrast enhancement is concerning for osteomyelitis. In the context of recent compression fracture, these findings are less specific. Tissue sampling might be considered for confirmation. No epidural abscess. 2. Abnormal contrast enhancement and edema within the medial right psoas muscle may indicate myositis. No psoas abscess. 3. Posterior paraspinal fluid collection at the L3-S1 levels. There are no specific findings to indicate that this collection is infected, but infection would be difficult to exclude. 4. T10-S1 posterior spinal fusion without spinal canal stenosis. Electronically Signed   By: Ulyses Jarred M.D.   On: 12/09/2016 04:40   Mr Lumbar Spine W Wo Contrast  Result Date: 12/09/2016 CLINICAL DATA:  Compression fracture and recent spinal surgery. EXAM: MRI THORACIC AND LUMBAR SPINE WITHOUT AND WITH CONTRAST TECHNIQUE: Multiplanar and multiecho pulse sequences of the thoracic and lumbar spine were obtained without and with intravenous contrast. CONTRAST:  83mL MULTIHANCE GADOBENATE DIMEGLUMINE 529 MG/ML IV SOLN COMPARISON:  Thoracic spine CT 12/06/2016 Lumbar spine MRI 06/07/16 FINDINGS: MRI THORACIC SPINE FINDINGS Alignment:  Physiologic. Vertebrae: There is posterior spinal fusion beginning at the T10 level and extending inferiorly to the sacrum. There are transpedicular screws at all levels. Cord:  Normal signal and morphology. Paraspinal and other soft tissues: Negative. Disc levels: No spinal canal or neural foraminal stenosis. MRI LUMBAR SPINE FINDINGS Segmentation:  Standard. Alignment:  Physiologic. Vertebrae: As above, there is posterior spinal fixation hardware extending from the thoracic spine to  the sacrum. There is loss of the normal T1 weighted signal within the L3 and L4 vertebral bodies. On postcontrast imaging, there is marked enhancement. There is a compression fracture of the L3 vertebral body with approximately 25% height loss. Assessment of the posterior elements is limited due to the susceptibility artifact from spinal hardware. There is a compression fracture at L4 with approximately 50% height loss centrally. Conus medullaris: Extends to the L1 level and appears normal. There is no  discrete epidural abscess or fluid collection. Paraspinal and other soft tissues: There is abnormal T2 weighted signal and contrast enhancement throughout much of the medial right psoas musculature. No fluid collection. There is a posterior paraspinal fluid collection extending from L3-2 sacrum. There is surrounding contrast enhancement, nonspecific. There is fatty infiltration of the posterior paraspinous musculature. Disc levels: Substantial susceptibility artifact limits assessment of the spinal canal. There is no visualize lumbar spinal canal stenosis. The visible neural foramina are patent. IMPRESSION: 1. Compression fractures at L3 and L4, as described on recent lumbar spine CT. Loss of normal T1 weighted signal with associated contrast enhancement is concerning for osteomyelitis. In the context of recent compression fracture, these findings are less specific. Tissue sampling might be considered for confirmation. No epidural abscess. 2. Abnormal contrast enhancement and edema within the medial right psoas muscle may indicate myositis. No psoas abscess. 3. Posterior paraspinal fluid collection at the L3-S1 levels. There are no specific findings to indicate that this collection is infected, but infection would be difficult to exclude. 4. T10-S1 posterior spinal fusion without spinal canal stenosis. Electronically Signed   By: Ulyses Jarred M.D.   On: 12/09/2016 04:40        Scheduled Meds: . docusate  sodium  100 mg Oral BID  . heparin  5,000 Units Subcutaneous Q8H  . potassium chloride  40 mEq Oral Q4H  . sodium chloride flush  3 mL Intravenous Q12H   Continuous Infusions: . dextrose 5 % and 0.9% NaCl 100 mL/hr at 12/09/16 0809  . methocarbamol (ROBAXIN)  IV Stopped (12/09/16 8206)  . piperacillin-tazobactam (ZOSYN)  IV Stopped (12/09/16 0156)  . vancomycin Stopped (12/09/16 1056)     LOS: 3 days    Time spent: 35 minutes    A Melven Sartorius, MD [facility] Pager [phone]  If 7PM-7AM, please contact night-coverage [email or web address] Password [facility] 12/09/2016, 11:38 AM

## 2016-12-09 NOTE — Anesthesia Procedure Notes (Signed)
Procedure Name: Intubation Date/Time: 12/09/2016 5:32 PM Performed by: Eligha Bridegroom Pre-anesthesia Checklist: Emergency Drugs available, Patient identified, Suction available, Patient being monitored and Timeout performed Patient Re-evaluated:Patient Re-evaluated prior to induction Oxygen Delivery Method: Circle system utilized Preoxygenation: Pre-oxygenation with 100% oxygen Induction Type: IV induction Ventilation: Mask ventilation without difficulty Laryngoscope Size: Mac and 4 Grade View: Grade I Tube type: Oral Tube size: 7.5 mm Number of attempts: 1 Airway Equipment and Method: Stylet Placement Confirmation: ETT inserted through vocal cords under direct vision,  positive ETCO2 and breath sounds checked- equal and bilateral Secured at: 21 cm Tube secured with: Tape Dental Injury: Teeth and Oropharynx as per pre-operative assessment

## 2016-12-09 NOTE — Progress Notes (Signed)
Report to [facility] from short stay. Pt is on his way to OR. Family at bedside aware.   Ave Filter, RN

## 2016-12-10 DIAGNOSIS — Z981 Arthrodesis status: Secondary | ICD-10-CM

## 2016-12-10 DIAGNOSIS — R7881 Bacteremia: Secondary | ICD-10-CM | POA: Diagnosis present

## 2016-12-10 LAB — BLOOD CULTURE ID PANEL (REFLEXED)
ACINETOBACTER BAUMANNII: NOT DETECTED
CANDIDA ALBICANS: NOT DETECTED
Candida glabrata: NOT DETECTED
Candida krusei: NOT DETECTED
Candida parapsilosis: NOT DETECTED
Candida tropicalis: NOT DETECTED
ENTEROBACTERIACEAE SPECIES: NOT DETECTED
ENTEROCOCCUS SPECIES: NOT DETECTED
Enterobacter cloacae complex: NOT DETECTED
Escherichia coli: NOT DETECTED
HAEMOPHILUS INFLUENZAE: NOT DETECTED
KLEBSIELLA OXYTOCA: NOT DETECTED
Klebsiella pneumoniae: NOT DETECTED
LISTERIA MONOCYTOGENES: NOT DETECTED
NEISSERIA MENINGITIDIS: NOT DETECTED
Proteus species: NOT DETECTED
Pseudomonas aeruginosa: NOT DETECTED
STREPTOCOCCUS AGALACTIAE: NOT DETECTED
STREPTOCOCCUS PYOGENES: NOT DETECTED
STREPTOCOCCUS SPECIES: NOT DETECTED
Serratia marcescens: NOT DETECTED
Staphylococcus aureus (BCID): NOT DETECTED
Staphylococcus species: NOT DETECTED
Streptococcus pneumoniae: NOT DETECTED

## 2016-12-10 LAB — BASIC METABOLIC PANEL
ANION GAP: 9 (ref 5–15)
BUN: 8 mg/dL (ref 6–20)
CALCIUM: 8.2 mg/dL — AB (ref 8.9–10.3)
CHLORIDE: 105 mmol/L (ref 101–111)
CO2: 26 mmol/L (ref 22–32)
CREATININE: 0.93 mg/dL (ref 0.61–1.24)
GFR calc non Af Amer: >60 mL/min (ref >60)
Glucose, Bld: 81 mg/dL (ref 65–99)
Potassium: 3.2 mmol/L — ABNORMAL LOW (ref 3.5–5.1)
SODIUM: 140 mmol/L (ref 135–145)

## 2016-12-10 LAB — CBC
HCT: 29.7 % — ABNORMAL LOW (ref 39.0–52.0)
HEMOGLOBIN: 9.3 g/dL — AB (ref 13.0–17.0)
MCH: 30.1 pg (ref 26.0–34.0)
MCHC: 31.3 g/dL (ref 30.0–36.0)
MCV: 96.1 fL (ref 78.0–100.0)
Platelets: 439 10*3/uL — ABNORMAL HIGH (ref 150–400)
RBC: 3.09 MIL/uL — ABNORMAL LOW (ref 4.22–5.81)
RDW: 14.1 % (ref 11.5–15.5)
WBC: 9.6 10*3/uL (ref 4.0–10.5)

## 2016-12-10 LAB — PROCALCITONIN: Procalcitonin: <0.1 ng/mL

## 2016-12-10 MED ORDER — POTASSIUM CHLORIDE CRYS ER 20 MEQ PO TBCR
40.0000 meq | EXTENDED_RELEASE_TABLET | Freq: Once | ORAL | Status: AC
  Administered 2016-12-10: 40 meq via ORAL
  Filled 2016-12-10: qty 2

## 2016-12-10 MED ORDER — CYANOCOBALAMIN 1000 MCG/ML IJ SOLN
1000.0000 ug | Freq: Once | INTRAMUSCULAR | Status: AC
  Administered 2016-12-11: 1000 ug via INTRAMUSCULAR
  Filled 2016-12-10: qty 1

## 2016-12-10 MED FILL — Thrombin For Soln 20000 Unit: CUTANEOUS | Qty: 1 | Status: AC

## 2016-12-10 NOTE — ED Notes (Signed)
Blood Culture result called to Dr. Kary Kos for aerobic blood culture bottle results with gram + cocci.

## 2016-12-10 NOTE — Progress Notes (Signed)
PROGRESS NOTE    Derek [name]  ZOX:[id number] DOB: 1945-06-04 DOA: 12/06/2016 PCP: Redmond School, MD (Confirm with patient/family/NH records and if not entered, this HAS to be entered at [facility] point of entry. "No PCP" if truly none.)   Brief Narrative: (Start on day 1 of progress note - keep it brief and live) The patient is a 71 y.o.malewith hx of chronic pain syndrome, known to have spinal stenosis, neurogenic claudication, with back surgery about 2 months ago, brought to the ER with increase confusion/ worsening lower back pain. Work up at [employer] new lumbar compression fracture, tachycardic, leukocytosis, neurosurgery contacted and recommended MRI to be done and transferred to [facility] to rule out any infectious process. MRI ordered and pending and Neurosurgery evaluated and feel that patient will likely need surgical revision but awaiting MRI results to make further definitive treatment recommendations.   MRI with findings possibly suggestive of infection.  Pt to OR yesteray with surgery, low suspicion for infection based on operative findings.  Cx pending. 1/2 Bcx resulted positive and ID has been consulted.   Assessment & Plan:   Active Problems:   Pain in limb   Avascular necrosis of bone of right hip (HCC)   Lumbar stenosis with neurogenic claudication   Sepsis (HCC)   Altered mental status   Hyponatremia   Hypokalemia   Lactic acidosis   Normocytic anemia   Anxiety   COPD (chronic obstructive pulmonary disease) (HCC)   Back pain   Compression fracture of lumbar vertebra (HCC)   Thrombocytosis (HCC)   Lumbar compression fracture (HCC)   Worsening lower back pain/Lumbago with a New Compression fractures by the CAT scan at L3 and L4 rule out Infection - MRI lumbar/thoracic spine with compression fx at L3/L4 (noted concern for osteo, but findings less specific with compression fracture).  Also with edema/enhancement within medial R psoas (consider myositis).   Posterior paraspinal fluid collection L3-S1.  T10-S1 posterior spinal fusion without stenosis.   -CT Thoracic Spine showed No acute, traumatic or unexpected finding in the thoracic region. The patient has recently at thoracolumbar fusion. In the thoracic region, this includes pedicle screws and posterior rods at T10, T11 and T12 which have good appearances. No abnormality seen above that. Distant ACDF C6-7 -CT Lumbar Spine showed Extension of the fusion from T10-L3. Surgical discontinuity of the posterior rods at the 3/4 level with old solid fusion from L4 to the sacrum. Chance type fracture at the L3 level extending in an axial plane through the vertebral body and posterior elements. No antero or retrolisthesis. Compression fracture of the L4 vertebral body at the superior aspect with loss of height of about 25%. - Neurosurgery with low suspicion for infection, but discussed taking tissue samples/culture if possible with them (MRI results as noted above) - s/p OR -Continue with IV antibiotics with IV Zosy; IV Vancomycin pharmacy to dose -> d/c zosyn per ID -1/2 Bcx positive, ID following appreciate recs -Pain Control with Acetaminophen 650 mg po/RC q6hprn mild pain, Morphine 1-3 mg q4hprn Moderate Severe Pain, Muscle Rub Cream, Topically, and Robaxin 550 mg po Daily on MAR -Bowel Regimen with Docusate Sodium 100 mg po BID and Lactulose 10-20 grams po qHSprn for Moderate Constipation -C/w D5 NS at 100 mL/hr  Acute Encephalopathy with Increased Confusion, improved - continuing to improve today per wife, A&Ox3 this morning.  -Did not Take More than Required Pain Medication -PCP thought patient had Prostatitis and was started on Ciprofloxacin as an outpatient (  negative UA on 8/19)  -CT Head showed No acute intracranial process. Sinus disease as above -Gabapentin and Diazepam Home medications currently held  -Continue to Treat Pain  -UDS Positive for Benzos  -C/w IVF Rehydration as Above  ?  Sepsis; ? Lumbar Infection - 1/2 bcx positive for gram postive cocci, ID consulted -As above -Lactic Acid Level was 2.14; normal lactate and procalcitonin this AM -normal WBC today, downtrending -C/w IVF with D5W NS at 100 mL/hr -Continue with IV antibiotics with IV Zosyn/vancomycin per pharmacy -> d/c zosyn per ID -Blood Cx x 2 show NGTD  Hypertension -Blood pressure is controlled -Avoid starting Home BP med of Lisinopril for now  Hx of COPD/Emphysema -Currently not decompensated -Continue to Monitor Respiratory Status and if necessary add Nebs  Anxiety -Takes Diazepam 10 mg 4 times Daily but currently held -Got 2.5 mg of Valium this AM and 5 mg of Valium last night -May need to restart Valium Slowly -Monitor for Withdrawals   Thrombocytosis - 439 today -Likely Reactive; ? Infection -Platelet Count went from 433 -> 426 -> 435 -> 452-> 439 -Continue to Monitor and Repeat CBC in AM  Hyponatremia - resolved  Hypokalemia - 3.2 today, replete -Continue to Monitor and Replete As Necessary -Repeat CMP in AM  Lactic Acidosis  - Resolved  Normocytic Anemia -Unknown Etiology so will obtain Anemia Panel - suggestive of AOCD, also with low B12, will plan to start b12 injections tomorrow  -9.3 today, likely decreased post op -Continue to Monitor for S/Sx of Bleeding and Repeat CBC in AM  DVT prophylaxis: SCD Code Status: full  Family Communication: wife Disposition Plan: pending surgery   Consultants:   Neurosurgery   Procedures: (Don't include imaging studies which can be auto populated. Include things that cannot be auto populated i.e. Echo, Carotid and venous dopplers, Foley, Bipap, HD, tubes/drains, wound vac, central lines etc) Exploration of lumbar fusion; open internal fixation of L3 and L4 Chance fracture's; open L3 and L4 kyphoplasty; posterior segmental instrumentation from L2-L5; redo posterior lateral arthrodesis L2-3, L3-4, L4-5 and L5-S1 with local  morselized autograft bone, bone morphogenic protein-soaked collagen sponges, and Bone graft extender  Antimicrobials: (specify start and planned stop date. Auto populated tables are space occupying and do not give end dates)  vanc   Subjective: Sitting up with brace on.  Still c/o pain.  No other complaints.  Objective: Vitals:   12/09/16 2315 12/09/16 2349 12/10/16 0131 12/10/16 0539  BP: (!) 144/76 (!) 156/65 (!) 152/74 (!) 159/77  Pulse: 83 70 74 78  Resp: 16 16 16 18   Temp: 97.9 F (36.6 C) 98 F (36.7 C) 98.1 F (36.7 C) 97.8 F (36.6 C)  TempSrc:  Axillary Axillary Axillary  SpO2: 100% 100% 100% 100%  Weight:  98.8 kg (217 lb 13 oz)    Height:  5\' 9"  (1.753 m)      Intake/Output Summary (Last 24 hours) at 12/10/16 0943 Last data filed at 12/10/16 0545  Gross per 24 hour  Intake             2935 ml  Output             1975 ml  Net              960 ml   Filed Weights   12/06/16 1254 12/09/16 1428 12/09/16 2349  Weight: 99.3 kg (219 lb) 99.3 kg (219 lb) 98.8 kg (217 lb 13 oz)    Examination:  General exam:  NAD with brace on Respiratory system: CTAB, no adventitious breath sounds Cardiovascular system: S1, S2, no mgr Gastrointestinal system: s/nt/nd Central nervous system: Alert and oriented. No focal neurological deficits. Extremities: no LEE Skin: brace in place, not able to look at back Psychiatry: Judgement and insight appear normal. Mood & affect appropriate.     Data Reviewed: I have personally reviewed following labs and imaging studies  CBC:  Recent Labs Lab 12/06/16 1324 12/07/16 0507 12/08/16 0921 12/09/16 0530 12/10/16 0442  WBC 11.8* 10.7* 11.0* 9.8 9.6  NEUTROABS 8.1*  --  7.2 5.8  --   HGB 11.4* 10.9* 10.4* 10.3* 9.3*  HCT 34.8* 32.6* 32.1* 31.9* 29.7*  MCV 95.1 95.3 94.1 94.9 96.1  PLT 433* 426* 435* 452* 528*   Basic Metabolic Panel:  Recent Labs Lab 12/06/16 1324 12/07/16 0507 12/08/16 0921 12/09/16 0530 12/10/16 0442    NA 130* 134* 137 139 140  K 3.3* 3.4* 3.5 3.0* 3.2*  CL 91* 100* 102 103 105  CO2 28 25 27 26 26   GLUCOSE 100* 115* 95 89 81  BUN 6 6 7 7 8   CREATININE 0.74 0.73 0.95 0.91 0.93  CALCIUM 8.7* 8.4* 8.5* 8.5* 8.2*  MG  --   --  1.7 1.9  --   PHOS  --   --  3.6 3.8  --    GFR: Estimated Creatinine Clearance: 85.6 mL/min (by C-G formula based on SCr of 0.93 mg/dL). Liver Function Tests:  Recent Labs Lab 12/06/16 1324 12/08/16 0921 12/09/16 0530  AST 40 38 30  ALT 33 34 31  ALKPHOS 119 97 95  BILITOT 0.4 0.7 0.6  PROT 6.8 5.7* 5.7*  ALBUMIN 3.1* 2.6* 2.6*    Recent Labs Lab 12/06/16 1324  LIPASE 31   No results for input(s): AMMONIA in the last 168 hours. Coagulation Profile: No results for input(s): INR, PROTIME in the last 168 hours. Cardiac Enzymes: No results for input(s): CKTOTAL, CKMB, CKMBINDEX, TROPONINI in the last 168 hours. BNP (last 3 results) No results for input(s): PROBNP in the last 8760 hours. HbA1C: No results for input(s): HGBA1C in the last 72 hours. CBG:  Recent Labs Lab 12/06/16 1303  GLUCAP 95   Lipid Profile: No results for input(s): CHOL, HDL, LDLCALC, TRIG, CHOLHDL, LDLDIRECT in the last 72 hours. Thyroid Function Tests: No results for input(s): TSH, T4TOTAL, FREET4, T3FREE, THYROIDAB in the last 72 hours. Anemia Panel:  Recent Labs  12/09/16 0530  VITAMINB12 104*  FOLATE 18.0  FERRITIN 199  TIBC 213*  IRON 52  RETICCTPCT 1.1   Sepsis Labs:  Recent Labs Lab 12/06/16 1400 12/09/16 0530 12/10/16 0442  PROCALCITON  --  <0.10 <0.10  LATICACIDVEN 2.14* 0.7  --     Recent Results (from the past 240 hour(s))  Blood Culture (routine x 2)     Status: None (Preliminary result)   Collection Time: 12/06/16  1:36 PM  Result Value Ref Range Status   Specimen Description BLOOD RIGHT ANTECUBITAL  Final   Special Requests Blood Culture adequate volume  Final   Culture  Setup Time   Final    GRAM POSITIVE COCCI AEROBIC  BOTTLE Gram Stain Report Called to,Read Back By and Verified With: NORMAN,B @0010  BY MATTHEWS,B 8.23.18 [facility]  Organism ID to follow    Culture GRAM POSITIVE COCCI  Final   Report Status PENDING  Incomplete  Blood Culture ID Panel (Reflexed)     Status: None   Collection Time: 12/06/16  1:36 PM  Result Value Ref Range Status   Enterococcus species NOT DETECTED NOT DETECTED Final   Listeria monocytogenes NOT DETECTED NOT DETECTED Final   Staphylococcus species NOT DETECTED NOT DETECTED Final   Staphylococcus aureus NOT DETECTED NOT DETECTED Final   Streptococcus species NOT DETECTED NOT DETECTED Final   Streptococcus agalactiae NOT DETECTED NOT DETECTED Final   Streptococcus pneumoniae NOT DETECTED NOT DETECTED Final   Streptococcus pyogenes NOT DETECTED NOT DETECTED Final   Acinetobacter baumannii NOT DETECTED NOT DETECTED Final   Enterobacteriaceae species NOT DETECTED NOT DETECTED Final   Enterobacter cloacae complex NOT DETECTED NOT DETECTED Final   Escherichia coli NOT DETECTED NOT DETECTED Final   Klebsiella oxytoca NOT DETECTED NOT DETECTED Final   Klebsiella pneumoniae NOT DETECTED NOT DETECTED Final   Proteus species NOT DETECTED NOT DETECTED Final   Serratia marcescens NOT DETECTED NOT DETECTED Final   Haemophilus influenzae NOT DETECTED NOT DETECTED Final   Neisseria meningitidis NOT DETECTED NOT DETECTED Final   Pseudomonas aeruginosa NOT DETECTED NOT DETECTED Final   Candida albicans NOT DETECTED NOT DETECTED Final   Candida glabrata NOT DETECTED NOT DETECTED Final   Candida krusei NOT DETECTED NOT DETECTED Final   Candida parapsilosis NOT DETECTED NOT DETECTED Final   Candida tropicalis NOT DETECTED NOT DETECTED Final    Comment: Performed at [facility], [address], [place],  [address]  Blood Culture (routine x 2)     Status: None (Preliminary result)   Collection Time: 12/06/16  1:43 PM  Result Value Ref Range Status   Specimen  Description BLOOD LEFT ANTECUBITAL  Final   Special Requests Blood Culture adequate volume  Final   Culture NO GROWTH 4 DAYS  Final   Report Status PENDING  Incomplete  Surgical pcr screen     Status: None   Collection Time: 12/09/16  1:44 PM  Result Value Ref Range Status   MRSA, PCR NEGATIVE NEGATIVE Final   Staphylococcus aureus NEGATIVE NEGATIVE Final    Comment:        The Xpert SA Assay (FDA approved for NASAL specimens in patients over 67 years of age), is one component of a comprehensive surveillance program.  Test performance has been validated by [facility] for patients greater than or equal to 17 year old. It is not intended to diagnose infection nor to guide or monitor treatment.   Aerobic/Anaerobic Culture (surgical/deep wound)     Status: None (Preliminary result)   Collection Time: 12/09/16  6:18 PM  Result Value Ref Range Status   Specimen Description WOUND  Final   Special Requests SPINE SURGICAL WOUND PT ON ZENSYN  Final   Gram Stain   Final    RARE WBC PRESENT, PREDOMINANTLY MONONUCLEAR NO ORGANISMS SEEN    Culture PENDING  Incomplete   Report Status PENDING  Incomplete         Radiology Studies: Dg Lumbar Spine 2-3 Views  Result Date: 12/09/2016 CLINICAL DATA:  Status post lumbar fusion and vertebroplasty. EXAM: LUMBAR SPINE - 2-3 VIEW; DG C-ARM 61-120 MIN FLUOROSCOPY TIME:  50 seconds. COMPARISON:  Radiographs of September 23, 2016. FINDINGS: Two intraoperative fluoroscopic images of the lumbar spine demonstrate the patient be status post posterior intrapedicular screw placement as well as vertebroplasty at 2 levels. Good alignment of vertebral bodies is noted. IMPRESSION: Status post surgical posterior fusion and vertebroplasty of lumbar spine. Electronically Signed   By: Marijo Conception, M.D.   On: 12/09/2016 21:28  Mr Thoracic Spine W Wo Contrast  Result Date: 12/09/2016 CLINICAL DATA:  Compression fracture and recent spinal surgery. EXAM: MRI  THORACIC AND LUMBAR SPINE WITHOUT AND WITH CONTRAST TECHNIQUE: Multiplanar and multiecho pulse sequences of the thoracic and lumbar spine were obtained without and with intravenous contrast. CONTRAST:  16mL MULTIHANCE GADOBENATE DIMEGLUMINE 529 MG/ML IV SOLN COMPARISON:  Thoracic spine CT 12/06/2016 Lumbar spine MRI 06/07/16 FINDINGS: MRI THORACIC SPINE FINDINGS Alignment:  Physiologic. Vertebrae: There is posterior spinal fusion beginning at the T10 level and extending inferiorly to the sacrum. There are transpedicular screws at all levels. Cord:  Normal signal and morphology. Paraspinal and other soft tissues: Negative. Disc levels: No spinal canal or neural foraminal stenosis. MRI LUMBAR SPINE FINDINGS Segmentation:  Standard. Alignment:  Physiologic. Vertebrae: As above, there is posterior spinal fixation hardware extending from the thoracic spine to the sacrum. There is loss of the normal T1 weighted signal within the L3 and L4 vertebral bodies. On postcontrast imaging, there is marked enhancement. There is a compression fracture of the L3 vertebral body with approximately 25% height loss. Assessment of the posterior elements is limited due to the susceptibility artifact from spinal hardware. There is a compression fracture at L4 with approximately 50% height loss centrally. Conus medullaris: Extends to the L1 level and appears normal. There is no discrete epidural abscess or fluid collection. Paraspinal and other soft tissues: There is abnormal T2 weighted signal and contrast enhancement throughout much of the medial right psoas musculature. No fluid collection. There is a posterior paraspinal fluid collection extending from L3-2 sacrum. There is surrounding contrast enhancement, nonspecific. There is fatty infiltration of the posterior paraspinous musculature. Disc levels: Substantial susceptibility artifact limits assessment of the spinal canal. There is no visualize lumbar spinal canal stenosis. The visible  neural foramina are patent. IMPRESSION: 1. Compression fractures at L3 and L4, as described on recent lumbar spine CT. Loss of normal T1 weighted signal with associated contrast enhancement is concerning for osteomyelitis. In the context of recent compression fracture, these findings are less specific. Tissue sampling might be considered for confirmation. No epidural abscess. 2. Abnormal contrast enhancement and edema within the medial right psoas muscle may indicate myositis. No psoas abscess. 3. Posterior paraspinal fluid collection at the L3-S1 levels. There are no specific findings to indicate that this collection is infected, but infection would be difficult to exclude. 4. T10-S1 posterior spinal fusion without spinal canal stenosis. Electronically Signed   By: Ulyses Jarred M.D.   On: 12/09/2016 04:40   Mr Lumbar Spine W Wo Contrast  Result Date: 12/09/2016 CLINICAL DATA:  Compression fracture and recent spinal surgery. EXAM: MRI THORACIC AND LUMBAR SPINE WITHOUT AND WITH CONTRAST TECHNIQUE: Multiplanar and multiecho pulse sequences of the thoracic and lumbar spine were obtained without and with intravenous contrast. CONTRAST:  59mL MULTIHANCE GADOBENATE DIMEGLUMINE 529 MG/ML IV SOLN COMPARISON:  Thoracic spine CT 12/06/2016 Lumbar spine MRI 06/07/16 FINDINGS: MRI THORACIC SPINE FINDINGS Alignment:  Physiologic. Vertebrae: There is posterior spinal fusion beginning at the T10 level and extending inferiorly to the sacrum. There are transpedicular screws at all levels. Cord:  Normal signal and morphology. Paraspinal and other soft tissues: Negative. Disc levels: No spinal canal or neural foraminal stenosis. MRI LUMBAR SPINE FINDINGS Segmentation:  Standard. Alignment:  Physiologic. Vertebrae: As above, there is posterior spinal fixation hardware extending from the thoracic spine to the sacrum. There is loss of the normal T1 weighted signal within the L3 and L4 vertebral bodies. On postcontrast imaging,  there  is marked enhancement. There is a compression fracture of the L3 vertebral body with approximately 25% height loss. Assessment of the posterior elements is limited due to the susceptibility artifact from spinal hardware. There is a compression fracture at L4 with approximately 50% height loss centrally. Conus medullaris: Extends to the L1 level and appears normal. There is no discrete epidural abscess or fluid collection. Paraspinal and other soft tissues: There is abnormal T2 weighted signal and contrast enhancement throughout much of the medial right psoas musculature. No fluid collection. There is a posterior paraspinal fluid collection extending from L3-2 sacrum. There is surrounding contrast enhancement, nonspecific. There is fatty infiltration of the posterior paraspinous musculature. Disc levels: Substantial susceptibility artifact limits assessment of the spinal canal. There is no visualize lumbar spinal canal stenosis. The visible neural foramina are patent. IMPRESSION: 1. Compression fractures at L3 and L4, as described on recent lumbar spine CT. Loss of normal T1 weighted signal with associated contrast enhancement is concerning for osteomyelitis. In the context of recent compression fracture, these findings are less specific. Tissue sampling might be considered for confirmation. No epidural abscess. 2. Abnormal contrast enhancement and edema within the medial right psoas muscle may indicate myositis. No psoas abscess. 3. Posterior paraspinal fluid collection at the L3-S1 levels. There are no specific findings to indicate that this collection is infected, but infection would be difficult to exclude. 4. T10-S1 posterior spinal fusion without spinal canal stenosis. Electronically Signed   By: Ulyses Jarred M.D.   On: 12/09/2016 04:40   Dg C-arm 1-60 Min  Result Date: 12/09/2016 CLINICAL DATA:  Status post lumbar fusion and vertebroplasty. EXAM: LUMBAR SPINE - 2-3 VIEW; DG C-ARM 61-120 MIN FLUOROSCOPY  TIME:  50 seconds. COMPARISON:  Radiographs of September 23, 2016. FINDINGS: Two intraoperative fluoroscopic images of the lumbar spine demonstrate the patient be status post posterior intrapedicular screw placement as well as vertebroplasty at 2 levels. Good alignment of vertebral bodies is noted. IMPRESSION: Status post surgical posterior fusion and vertebroplasty of lumbar spine. Electronically Signed   By: Marijo Conception, M.D.   On: 12/09/2016 21:28        Scheduled Meds: . bupivacaine liposome  20 mL Infiltration Once  . docusate sodium  100 mg Oral BID  . heparin  5,000 Units Subcutaneous Q8H  . HYDROmorphone      . HYDROmorphone      . sodium chloride flush  3 mL Intravenous Q12H  . sodium chloride flush  3 mL Intravenous Q12H   Continuous Infusions: . sodium chloride 250 mL (12/10/16 0058)  . dextrose 5 % and 0.9% NaCl 100 mL/hr at 12/09/16 0809  . lactated ringers 10 mL/hr at 12/09/16 1427  . methocarbamol (ROBAXIN)  IV Stopped (12/10/16 0534)  . piperacillin-tazobactam (ZOSYN)  IV Stopped (12/10/16 0933)  . vancomycin 1,250 mg (12/10/16 0924)     LOS: 4 days    Time spent: 35 minutes    A Melven Sartorius, MD [facility] Pager [phone]  If 7PM-7AM, please contact night-coverage [email or web address] Password [facility] 12/10/2016, 9:43 AM

## 2016-12-10 NOTE — Progress Notes (Signed)
RN clarified with Dr. Arnoldo Morale about which brace to use for patient, patient should use TLSO brace. Not aspen lumbar brace.

## 2016-12-10 NOTE — Care Management Note (Signed)
Case Management Note  Patient Details  Name: Derek [name] MRN: [id number] Date of Birth: 09/22/1945  Subjective/Objective:                 Spoke to patient wife at the bedside. They have RW, BSC, Tub bench, and adjustable bed w siderail at home. PT eval pending, and CM will follow. Patient would like [facility] as first choice for Derek [name], and [facility] as second choice.    Action/Plan:  CM will continue to follow for PT rec and DC needs.  Expected Discharge Date:                  Expected Discharge Plan:  [place]  In-House Referral:     Discharge planning Services  CM Consult  Post Acute Care Choice:    Choice offered to:     DME Arranged:    DME Agency:     HH Arranged:    [place] Agency:     Status of Service:  In process, will continue to follow  If discussed at Long Length of Stay Meetings, dates discussed:    Additional Comments:  Carles Collet, RN 12/10/2016, 2:16 PM

## 2016-12-10 NOTE — Progress Notes (Signed)
Orthopedic Tech Progress Note Patient Details:  LORENE SAMAAN [birth year] [id number]  Patient ID: Verdon Cummins, male   DOB: 1945/08/11, 71 y.o.   MRN: [id number]   Hildred Priest 12/10/2016, 8:52 AM Called in bio-tech brace order;spoke with Ronalee Belts

## 2016-12-10 NOTE — Consult Note (Signed)
[facility] for Infectious Disease    Date of Admission:  12/06/2016           Day 5 vancomycin        Day 5 piperacillin tazobactam       Reason for Consult: Positive blood culture    Referring Provider: Dr. Earle Gell  Assessment: I suspect that the one positive blood culture will turn out to be an insignificant contaminant. Hopefully we will know more by tomorrow morning. I will continue vancomycin pending further blood culture review but go ahead and stop piperacillin tazobactam now.   Plan: 1. Continue vancomycin pending blood culture results 2. Discontinue piperacillin tazobactam   Principal Problem:   Positive blood culture Active Problems:   Compression fracture of lumbar vertebra (HCC)   Status post lumbar spinal fusion   Pain in limb   Avascular necrosis of bone of right hip (HCC)   Lumbar stenosis with neurogenic claudication   Sepsis (HCC)   Altered mental status   Hyponatremia   Hypokalemia   Lactic acidosis   Normocytic anemia   Anxiety   COPD (chronic obstructive pulmonary disease) (HCC)   Back pain   Thrombocytosis ([place])   . bupivacaine liposome  20 mL Infiltration Once  . docusate sodium  100 mg Oral BID  . heparin  5,000 Units Subcutaneous Q8H  . sodium chloride flush  3 mL Intravenous Q12H  . sodium chloride flush  3 mL Intravenous Q12H    HPI: Derek [name] is a 71 y.o. male with degenerative spine disease and spinal stenosis. He has undergone multiple spinal fusion procedures of the thoracolumbar spine, the last being on 09/23/2016. Apparently he recently developed increasing back pain. His admission note indicates that he had seen his primary care provider recently and was started on ciprofloxacin because of concerns about a possible UTI. Derek [name] has no recall of this and denies recalling any new urinary symptoms recently. He then became confused leading to admission on 12/06/2016. He was afebrile with only mild leukocytosis of  10,700. Blood cultures were obtained and he was started on broad empiric therapy with vancomycin and piperacillin tazobactam. CT and MRI revealed acute fractures of L3 and L4 with no obvious signs of infection. He underwent surgery yesterday and had kyphoplasty of the fractures. The operative findings did not suggest infection. Last night one blood culture was reported to be growing gram-positive cocci. [facility] ID panel did not identify a specific organism.   Review of Systems: Review of Systems  Unable to perform ROS: Medical condition    Past Medical History:  Diagnosis Date  . Adenomatous polyps    remote past, on 5 year surveillance  . Arthritis   . Bell's palsy 40+yrs ago  . Cancer ([place])    skin  . COPD (chronic obstructive pulmonary disease) (HCC)    emphysema, pt. states he is not aware of this  . Diverticulitis   . Dyspnea    pt. denies  . Enlarged prostate   . Flat affect   . Headache(784.0)    having recently  . Hip pain 11/2016  . Hypertension   . Peripheral vascular disease ([place])   . Pneumonia   . Sepsis ([place]) 11/2016    Social History  Substance Use Topics  . Smoking status: Current Every Day Smoker    Packs/day: 2.00    Years: 52.00    Types: Cigarettes  . Smokeless tobacco: Never Used  Comment: Up to 2 pks per day depending on stress levels.   . Alcohol use No    Family History  Problem Relation Age of Onset  . Heart disease Mother        After age 21  . Heart attack Father   . Heart disease Father        Heart Disease before age 28  . Colon cancer Neg Hx    Allergies  Allergen Reactions  . Prednisone Other (See Comments)    Wife report it makes him "crazy as a bat".    OBJECTIVE: Blood pressure 134/65, pulse 98, temperature 98.1 F (36.7 C), temperature source Oral, resp. rate 17, height 5\' 9"  (1.753 m), weight 217 lb 13 oz (98.8 kg), SpO2 98 %.  Physical Exam  Constitutional:  He is grimacing, moaning and crying because of pain in his  back and left leg. He has difficulty answering my questions because of his pain.  Cardiovascular: Normal rate and regular rhythm.   No murmur heard. Pulmonary/Chest: Effort normal and breath sounds normal. He has no wheezes. He has no rales.  Abdominal: Soft.  Musculoskeletal:  His back incision is bandaged.  Neurological: He is alert.  Skin: No rash noted.    Lab Results Lab Results  Component Value Date   WBC 9.6 12/10/2016   HGB 9.3 (L) 12/10/2016   HCT 29.7 (L) 12/10/2016   MCV 96.1 12/10/2016   PLT 439 (H) 12/10/2016    Lab Results  Component Value Date   CREATININE 0.93 12/10/2016   BUN 8 12/10/2016   NA 140 12/10/2016   K 3.2 (L) 12/10/2016   CL 105 12/10/2016   CO2 26 12/10/2016    Lab Results  Component Value Date   ALT 31 12/09/2016   AST 30 12/09/2016   ALKPHOS 95 12/09/2016   BILITOT 0.6 12/09/2016     Microbiology: Recent Results (from the past 240 hour(s))  Blood Culture (routine x 2)     Status: None (Preliminary result)   Collection Time: 12/06/16  1:36 PM  Result Value Ref Range Status   Specimen Description BLOOD RIGHT ANTECUBITAL  Final   Special Requests Blood Culture adequate volume  Final   Culture  Setup Time   Final    GRAM POSITIVE COCCI AEROBIC BOTTLE Gram Stain Report Called to,Read Back By and Verified With: NORMAN,B @0010  BY MATTHEWS,B 8.23.18 [place]     Culture GRAM POSITIVE COCCI  Final   Report Status PENDING  Incomplete  Blood Culture ID Panel (Reflexed)     Status: None   Collection Time: 12/06/16  1:36 PM  Result Value Ref Range Status   Enterococcus species NOT DETECTED NOT DETECTED Final   Listeria monocytogenes NOT DETECTED NOT DETECTED Final   Staphylococcus species NOT DETECTED NOT DETECTED Final   Staphylococcus aureus NOT DETECTED NOT DETECTED Final   Streptococcus species NOT DETECTED NOT DETECTED Final   Streptococcus agalactiae NOT DETECTED NOT DETECTED Final   Streptococcus pneumoniae NOT DETECTED  NOT DETECTED Final   Streptococcus pyogenes NOT DETECTED NOT DETECTED Final   Acinetobacter baumannii NOT DETECTED NOT DETECTED Final   Enterobacteriaceae species NOT DETECTED NOT DETECTED Final   Enterobacter cloacae complex NOT DETECTED NOT DETECTED Final   Escherichia coli NOT DETECTED NOT DETECTED Final   Klebsiella oxytoca NOT DETECTED NOT DETECTED Final   Klebsiella pneumoniae NOT DETECTED NOT DETECTED Final   Proteus species NOT DETECTED NOT DETECTED Final   Serratia marcescens NOT DETECTED  NOT DETECTED Final   Haemophilus influenzae NOT DETECTED NOT DETECTED Final   Neisseria meningitidis NOT DETECTED NOT DETECTED Final   Pseudomonas aeruginosa NOT DETECTED NOT DETECTED Final   Candida albicans NOT DETECTED NOT DETECTED Final   Candida glabrata NOT DETECTED NOT DETECTED Final   Candida krusei NOT DETECTED NOT DETECTED Final   Candida parapsilosis NOT DETECTED NOT DETECTED Final   Candida tropicalis NOT DETECTED NOT DETECTED Final    Comment: Performed at [facility], [place] [address], [place], [place] [address]  Blood Culture (routine x 2)     Status: None (Preliminary result)   Collection Time: 12/06/16  1:43 PM  Result Value Ref Range Status   Specimen Description BLOOD LEFT ANTECUBITAL  Final   Special Requests Blood Culture adequate volume  Final   Culture NO GROWTH 4 DAYS  Final   Report Status PENDING  Incomplete  Surgical pcr screen     Status: None   Collection Time: 12/09/16  1:44 PM  Result Value Ref Range Status   MRSA, PCR NEGATIVE NEGATIVE Final   Staphylococcus aureus NEGATIVE NEGATIVE Final    Comment:        The Xpert SA Assay (FDA approved for NASAL specimens in patients over 39 years of age), is one component of a comprehensive surveillance program.  Test performance has been validated by [facility] for patients greater than or equal to 33 year old. It is not intended to diagnose infection nor to guide or monitor treatment.     Aerobic/Anaerobic Culture (surgical/deep wound)     Status: None (Preliminary result)   Collection Time: 12/09/16  6:18 PM  Result Value Ref Range Status   Specimen Description WOUND  Final   Special Requests SPINE SURGICAL WOUND PT ON ZENSYN  Final   Gram Stain   Final    RARE WBC PRESENT, PREDOMINANTLY MONONUCLEAR NO ORGANISMS SEEN    Culture NO GROWTH < 24 HOURS  Final   Report Status PENDING  Incomplete    Michel Bickers, MD [facility] for [facility] [phone] pager   [phone] cell 12/10/2016, 1:26 PM

## 2016-12-10 NOTE — Anesthesia Postprocedure Evaluation (Signed)
Anesthesia Post Note  Patient: Derek [name]  Procedure(s) Performed: Procedure(s) (LRB): REVISION LUMBAR FUSION (N/A) Vertebroplasty (N/A)     Patient location during evaluation: PACU Anesthesia Type: General Level of consciousness: awake and alert Pain management: pain level controlled Vital Signs Assessment: post-procedure vital signs reviewed and stable Respiratory status: spontaneous breathing, nonlabored ventilation, respiratory function stable and patient connected to nasal cannula oxygen Cardiovascular status: blood pressure returned to baseline and stable Postop Assessment: no signs of nausea or vomiting Anesthetic complications: no    Last Vitals:  Vitals:   12/09/16 2315 12/09/16 2349  BP: (!) 144/76 (!) 156/65  Pulse: 83 70  Resp: 16 16  Temp: 36.6 C 36.7 C  SpO2: 100% 100%    Last Pain:  Vitals:   12/09/16 2349  TempSrc: Axillary  PainSc:                  Effie Berkshire

## 2016-12-10 NOTE — Evaluation (Signed)
Physical Therapy Evaluation Patient Details Name: Derek [name] MRN: [id number] DOB: 1946/01/14 Today's Date: 12/10/2016   History of Present Illness  71 y.o. male admitted on 12/06/16 for AMS.  Suspected spinal abcess, so transferred from [facility] to [facility] for further assessment.  Pt dx with L3-4 chance fx and underwent exploration and lumbar fusion; open internal fixation of L3 and L4 Chance fracture's; open L3 and L4 kyphoplasty; posterior segmental instrumentation from L2-L5; redo posterior lateral arthrodesis L2-3, L3-4, L4-5 and L5-S.  Pt with significant PMH of recent lumbar fusion up to T10 (09/2016), PVD, HTN, COPD, Bell's palsey, R THA, R RTC repair, and multiple back surgeries.    Clinical Impression  Pt limited by significant pain today.  TLSO donned in bed and pt was able to get OOB to chair with a few pivotal steps and RW, but all of this took >45 mins.  He would benefit from post acute rehab so that he can get to the point where he can return home again with assistance from his wife.   PT to follow acutely for deficits listed below.       Follow Up Recommendations CIR    Equipment Recommendations  None recommended by PT    Recommendations for Other Services Rehab consult     Precautions / Restrictions Precautions Precautions: Back Required Braces or Orthoses: Spinal Brace Spinal Brace: Thoracolumbosacral orthotic;Applied in supine position      Mobility  Bed Mobility Overal bed mobility: Needs Assistance Bed Mobility: Rolling;Sidelying to Sit Rolling: Mod assist Sidelying to sit: Mod assist       General bed mobility comments: Mod assist to help pt turn trun and pelvis to donn brace in bed, pt with verbal cues for log roll technique, wife assisting with care,   Transfers Overall transfer level: Needs assistance Equipment used: Rolling walker (2 wheeled) Transfers: Sit to/from [facility] Sit to Stand: +2 physical assistance;Mod assist Stand pivot  transfers: +2 physical assistance;Mod assist       General transfer comment: Two person mod assist to stand and pivot with RW to the recliner chair.  It took multiple attempts from elevated bed to get to standing wife helping to stabilize the RW, pt found to have stool in the bed and needed to stand two more times to get cleaned up.    Ambulation/Gait             General Gait Details: unable at this time.          Balance Overall balance assessment: Needs assistance Sitting-balance support: Feet supported;Bilateral upper extremity supported Sitting balance-Leahy Scale: Poor Sitting balance - Comments: left lateral lean as he seems to have spasms (right side?) when he sits straight up.  He has had a muscle relaxer recently per wife. Postural control: Left lateral lean Standing balance support: Bilateral upper extremity supported Standing balance-Leahy Scale: Poor Standing balance comment: Two person mod assist with heavy reliance on hands on RW, RW adjusted down to better fit his height and arm length.                              Pertinent Vitals/Pain Pain Assessment: 0-10 Pain Score: 10-Worst pain ever Pain Location: incisional Pain Descriptors / Indicators: Aching;Constant;Jabbing;Cramping Pain Intervention(s): Limited activity within patient's tolerance;Monitored during session;Repositioned;Patient requesting pain meds-RN notified;RN gave pain meds during session    Home Living Family/patient expects to be discharged to:: Private residence Living Arrangements: Spouse/significant  other Available Help at Discharge: Family;Available 24 hours/day Type of Home: House Home Access: Level entry     Home Layout: One level Home Equipment: Walker - 2 wheels;Wheelchair - manual;Tub bench      Prior Function Level of Independence: Needs assistance   Gait / Transfers Assistance Needed: pt had recently gotten to the point where he was unable to walk.  Wife assisting  a lot at home.               Extremity/Trunk Assessment   Upper Extremity Assessment Upper Extremity Assessment: Defer to OT evaluation    Lower Extremity Assessment Lower Extremity Assessment: RLE deficits/detail;LLE deficits/detail RLE Deficits / Details: generalized weakness, difficult to assess due to pain, however, right leg or maybe right side of low back seemed to have increased pain in sitting and standing as pt was weight shifting off of this side.  RLE: Unable to fully assess due to pain LLE Deficits / Details: generally weak, but difficult to assess due to pain.  LLE: Unable to fully assess due to pain    Cervical / Trunk Assessment Cervical / Trunk Assessment: Other exceptions Cervical / Trunk Exceptions: pt with h/o lumbar surgeries in the past.   Communication      Cognition Arousal/Alertness: Lethargic Behavior During Therapy: Flat affect Overall Cognitive Status: Impaired/Different from baseline Area of Impairment: Problem solving                             Problem Solving: Difficulty sequencing;Requires verbal cues;Requires tactile cues General Comments: When asked to scoot backwards pt started scooting forwards, despite explaining who I am and what we were going to do pt very confused as I started preparing him to get up OOB.              Assessment/Plan    PT Assessment Patient needs continued PT services  PT Problem List Decreased strength;Decreased mobility;Decreased activity tolerance;Decreased balance;Decreased cognition;Decreased knowledge of use of DME;Decreased knowledge of precautions;Pain       PT Treatment Interventions DME instruction;Gait training;Functional mobility training;Therapeutic activities;Therapeutic exercise;Balance training;Neuromuscular re-education;Cognitive remediation;Patient/family education    PT Goals (Current goals can be found in the Care Plan section)  Acute Rehab PT Goals Patient Stated Goal: to go  home, decrease pain PT Goal Formulation: With patient/family Time For Goal Achievement: 12/24/16 Potential to Achieve Goals: Good    Frequency Min 5X/week           AM-PAC PT "6 Clicks" Daily Activity  Outcome Measure Difficulty turning over in bed (including adjusting bedclothes, sheets and blankets)?: Unable Difficulty moving from lying on back to sitting on the side of the bed? : Unable Difficulty sitting down on and standing up from a chair with arms (e.g., wheelchair, bedside commode, etc,.)?: Unable Help needed moving to and from a bed to chair (including a wheelchair)?: A Lot Help needed walking in hospital room?: A Lot Help needed climbing 3-5 steps with a railing? : Total 6 Click Score: 8    End of Session Equipment Utilized During Treatment: Back brace Activity Tolerance: Patient limited by fatigue;Patient limited by pain Patient left: in chair;with call bell/phone within reach;with chair alarm set;with family/visitor present Nurse Communication: Mobility status PT Visit Diagnosis: Muscle weakness (generalized) (M62.81);Difficulty in walking, not elsewhere classified (R26.2);Pain Pain - Right/Left:  (lower) Pain - part of body:  (back)    Time: 9622-2979 PT Time Calculation (min) (ACUTE ONLY): 47 min  Charges:        Barbarann Ehlers. Ramsha Lonigro, PT, DPT [phone]   PT Evaluation $PT Eval Moderate Complexity: 1 Mod PT Treatments $Therapeutic Activity: 23-37 mins   12/10/2016, 5:24 PM

## 2016-12-10 NOTE — Progress Notes (Signed)
Patient ID: Derek [name], male   DOB: 11-11-1945, 71 y.o.   MRN: [id number] Subjective:  The patient is resting comfortably. His wife is at the bedside.  Objective: Vital signs in last 24 hours: Temp:  [97.2 F (36.2 C)-98.7 F (37.1 C)] 97.8 F (36.6 C) (08/23 0539) Pulse Rate:  [70-90] 78 (08/23 0539) Resp:  [12-19] 18 (08/23 0539) BP: (125-159)/(58-87) 159/77 (08/23 0539) SpO2:  [89 %-100 %] 100 % (08/23 0539) Weight:  [98.8 kg (217 lb 13 oz)-99.3 kg (219 lb)] 98.8 kg (217 lb 13 oz) (08/22 2349)  Intake/Output from previous day: 08/22 0701 - 08/23 0700 In: 3040 [I.V.:2435; IV Piggyback:605] Out: 1975 [Urine:1725; Blood:250] Intake/Output this shift: No intake/output data recorded.  Physical exam the patient is resting comfortably. He moves his lower extremities well.  Lab Results:  Recent Labs  12/09/16 0530 12/10/16 0442  WBC 9.8 9.6  HGB 10.3* 9.3*  HCT 31.9* 29.7*  PLT 452* 439*   BMET  Recent Labs  12/09/16 0530 12/10/16 0442  NA 139 140  K 3.0* 3.2*  CL 103 105  CO2 26 26  GLUCOSE 89 81  BUN 7 8  CREATININE 0.91 0.93  CALCIUM 8.5* 8.2*    Studies/Results: Dg Lumbar Spine 2-3 Views  Result Date: 12/09/2016 CLINICAL DATA:  Status post lumbar fusion and vertebroplasty. EXAM: LUMBAR SPINE - 2-3 VIEW; DG C-ARM 61-120 MIN FLUOROSCOPY TIME:  50 seconds. COMPARISON:  Radiographs of September 23, 2016. FINDINGS: Two intraoperative fluoroscopic images of the lumbar spine demonstrate the patient be status post posterior intrapedicular screw placement as well as vertebroplasty at 2 levels. Good alignment of vertebral bodies is noted. IMPRESSION: Status post surgical posterior fusion and vertebroplasty of lumbar spine. Electronically Signed   By: Marijo Conception, M.D.   On: 12/09/2016 21:28   Mr Thoracic Spine W Wo Contrast  Result Date: 12/09/2016 CLINICAL DATA:  Compression fracture and recent spinal surgery. EXAM: MRI THORACIC AND LUMBAR SPINE WITHOUT AND WITH  CONTRAST TECHNIQUE: Multiplanar and multiecho pulse sequences of the thoracic and lumbar spine were obtained without and with intravenous contrast. CONTRAST:  64mL MULTIHANCE GADOBENATE DIMEGLUMINE 529 MG/ML IV SOLN COMPARISON:  Thoracic spine CT 12/06/2016 Lumbar spine MRI 06/07/16 FINDINGS: MRI THORACIC SPINE FINDINGS Alignment:  Physiologic. Vertebrae: There is posterior spinal fusion beginning at the T10 level and extending inferiorly to the sacrum. There are transpedicular screws at all levels. Cord:  Normal signal and morphology. Paraspinal and other soft tissues: Negative. Disc levels: No spinal canal or neural foraminal stenosis. MRI LUMBAR SPINE FINDINGS Segmentation:  Standard. Alignment:  Physiologic. Vertebrae: As above, there is posterior spinal fixation hardware extending from the thoracic spine to the sacrum. There is loss of the normal T1 weighted signal within the L3 and L4 vertebral bodies. On postcontrast imaging, there is marked enhancement. There is a compression fracture of the L3 vertebral body with approximately 25% height loss. Assessment of the posterior elements is limited due to the susceptibility artifact from spinal hardware. There is a compression fracture at L4 with approximately 50% height loss centrally. Conus medullaris: Extends to the L1 level and appears normal. There is no discrete epidural abscess or fluid collection. Paraspinal and other soft tissues: There is abnormal T2 weighted signal and contrast enhancement throughout much of the medial right psoas musculature. No fluid collection. There is a posterior paraspinal fluid collection extending from L3-2 sacrum. There is surrounding contrast enhancement, nonspecific. There is fatty infiltration of the posterior paraspinous musculature. Disc levels:  Substantial susceptibility artifact limits assessment of the spinal canal. There is no visualize lumbar spinal canal stenosis. The visible neural foramina are patent. IMPRESSION: 1.  Compression fractures at L3 and L4, as described on recent lumbar spine CT. Loss of normal T1 weighted signal with associated contrast enhancement is concerning for osteomyelitis. In the context of recent compression fracture, these findings are less specific. Tissue sampling might be considered for confirmation. No epidural abscess. 2. Abnormal contrast enhancement and edema within the medial right psoas muscle may indicate myositis. No psoas abscess. 3. Posterior paraspinal fluid collection at the L3-S1 levels. There are no specific findings to indicate that this collection is infected, but infection would be difficult to exclude. 4. T10-S1 posterior spinal fusion without spinal canal stenosis. Electronically Signed   By: Ulyses Jarred M.D.   On: 12/09/2016 04:40   Mr Lumbar Spine W Wo Contrast  Result Date: 12/09/2016 CLINICAL DATA:  Compression fracture and recent spinal surgery. EXAM: MRI THORACIC AND LUMBAR SPINE WITHOUT AND WITH CONTRAST TECHNIQUE: Multiplanar and multiecho pulse sequences of the thoracic and lumbar spine were obtained without and with intravenous contrast. CONTRAST:  49mL MULTIHANCE GADOBENATE DIMEGLUMINE 529 MG/ML IV SOLN COMPARISON:  Thoracic spine CT 12/06/2016 Lumbar spine MRI 06/07/16 FINDINGS: MRI THORACIC SPINE FINDINGS Alignment:  Physiologic. Vertebrae: There is posterior spinal fusion beginning at the T10 level and extending inferiorly to the sacrum. There are transpedicular screws at all levels. Cord:  Normal signal and morphology. Paraspinal and other soft tissues: Negative. Disc levels: No spinal canal or neural foraminal stenosis. MRI LUMBAR SPINE FINDINGS Segmentation:  Standard. Alignment:  Physiologic. Vertebrae: As above, there is posterior spinal fixation hardware extending from the thoracic spine to the sacrum. There is loss of the normal T1 weighted signal within the L3 and L4 vertebral bodies. On postcontrast imaging, there is marked enhancement. There is a  compression fracture of the L3 vertebral body with approximately 25% height loss. Assessment of the posterior elements is limited due to the susceptibility artifact from spinal hardware. There is a compression fracture at L4 with approximately 50% height loss centrally. Conus medullaris: Extends to the L1 level and appears normal. There is no discrete epidural abscess or fluid collection. Paraspinal and other soft tissues: There is abnormal T2 weighted signal and contrast enhancement throughout much of the medial right psoas musculature. No fluid collection. There is a posterior paraspinal fluid collection extending from L3-2 sacrum. There is surrounding contrast enhancement, nonspecific. There is fatty infiltration of the posterior paraspinous musculature. Disc levels: Substantial susceptibility artifact limits assessment of the spinal canal. There is no visualize lumbar spinal canal stenosis. The visible neural foramina are patent. IMPRESSION: 1. Compression fractures at L3 and L4, as described on recent lumbar spine CT. Loss of normal T1 weighted signal with associated contrast enhancement is concerning for osteomyelitis. In the context of recent compression fracture, these findings are less specific. Tissue sampling might be considered for confirmation. No epidural abscess. 2. Abnormal contrast enhancement and edema within the medial right psoas muscle may indicate myositis. No psoas abscess. 3. Posterior paraspinal fluid collection at the L3-S1 levels. There are no specific findings to indicate that this collection is infected, but infection would be difficult to exclude. 4. T10-S1 posterior spinal fusion without spinal canal stenosis. Electronically Signed   By: Ulyses Jarred M.D.   On: 12/09/2016 04:40   Dg C-arm 1-60 Min  Result Date: 12/09/2016 CLINICAL DATA:  Status post lumbar fusion and vertebroplasty. EXAM: LUMBAR SPINE - 2-3  VIEW; DG C-ARM 61-120 MIN FLUOROSCOPY TIME:  50 seconds. COMPARISON:   Radiographs of September 23, 2016. FINDINGS: Two intraoperative fluoroscopic images of the lumbar spine demonstrate the patient be status post posterior intrapedicular screw placement as well as vertebroplasty at 2 levels. Good alignment of vertebral bodies is noted. IMPRESSION: Status post surgical posterior fusion and vertebroplasty of lumbar spine. Electronically Signed   By: Marijo Conception, M.D.   On: 12/09/2016 21:28    Assessment/Plan: Postop day #1: We will mobilize the patient with physical therapy wearing his TLSO. It needs to be doned while supine in bed.  Positive blood cultures: Patient had blood cultures on 12/06/2016 which demonstrated gram-positive cocci. I don't know whether this was a contaminant or real. His wound looked fine out evidence of infection. I did obtain cultures which are pending. We will continue with empiric antibiotics and ask ID to see the patient.  LOS: 4 days     Kennadi Albany D 12/10/2016, 8:38 AM

## 2016-12-11 ENCOUNTER — Encounter (HOSPITAL_COMMUNITY): Payer: Self-pay | Admitting: Neurosurgery

## 2016-12-11 DIAGNOSIS — R7881 Bacteremia: Secondary | ICD-10-CM

## 2016-12-11 LAB — CULTURE, BLOOD (ROUTINE X 2)
CULTURE: NO GROWTH
SPECIAL REQUESTS: ADEQUATE

## 2016-12-11 LAB — BASIC METABOLIC PANEL
ANION GAP: 9 (ref 5–15)
BUN: 5 mg/dL — ABNORMAL LOW (ref 6–20)
CALCIUM: 8.4 mg/dL — AB (ref 8.9–10.3)
CHLORIDE: 100 mmol/L — AB (ref 101–111)
CO2: 28 mmol/L (ref 22–32)
Creatinine, Ser: 0.81 mg/dL (ref 0.61–1.24)
GFR calc non Af Amer: >60 mL/min (ref >60)
Glucose, Bld: 100 mg/dL — ABNORMAL HIGH (ref 65–99)
Potassium: 3 mmol/L — ABNORMAL LOW (ref 3.5–5.1)
Sodium: 137 mmol/L (ref 135–145)

## 2016-12-11 LAB — CBC
HEMATOCRIT: 28.3 % — AB (ref 39.0–52.0)
Hemoglobin: 8.9 g/dL — ABNORMAL LOW (ref 13.0–17.0)
MCH: 29.5 pg (ref 26.0–34.0)
MCHC: 31.4 g/dL (ref 30.0–36.0)
MCV: 93.7 fL (ref 78.0–100.0)
PLATELETS: 352 10*3/uL (ref 150–400)
RBC: 3.02 MIL/uL — AB (ref 4.22–5.81)
RDW: 14 % (ref 11.5–15.5)
WBC: 10.1 10*3/uL (ref 4.0–10.5)

## 2016-12-11 LAB — VANCOMYCIN, TROUGH: Vancomycin Tr: 58 ug/mL (ref 15–20)

## 2016-12-11 MED ORDER — ENSURE ENLIVE PO LIQD
237.0000 mL | Freq: Three times a day (TID) | ORAL | Status: DC
  Administered 2016-12-11 – 2016-12-14 (×9): 237 mL via ORAL

## 2016-12-11 MED ORDER — LISINOPRIL 20 MG PO TABS
20.0000 mg | ORAL_TABLET | Freq: Every day | ORAL | Status: DC
  Administered 2016-12-12 – 2016-12-14 (×3): 20 mg via ORAL
  Filled 2016-12-11 (×4): qty 1

## 2016-12-11 MED ORDER — POTASSIUM CHLORIDE CRYS ER 20 MEQ PO TBCR
40.0000 meq | EXTENDED_RELEASE_TABLET | Freq: Two times a day (BID) | ORAL | Status: AC
  Administered 2016-12-11 – 2016-12-13 (×6): 40 meq via ORAL
  Filled 2016-12-11 (×6): qty 2

## 2016-12-11 NOTE — Progress Notes (Addendum)
Initial Nutrition Assessment  DOCUMENTATION CODES:   Obesity unspecified  INTERVENTION:    Ensure Enlive po TID, each supplement provides 350 kcal and 20 grams of protein  NUTRITION DIAGNOSIS:   Inadequate oral intake related to  (pain) as evidenced by meal completion < 25%  GOAL:   Patient will meet greater than or equal to 90% of their needs  MONITOR:   PO intake, Supplement acceptance, Labs, Weight trends, Skin, I & O's  REASON FOR ASSESSMENT:   Consult Poor PO  ASSESSMENT:   71 y.o. Male with hx of chronic pain syndrome, known to have spinal stenosis, neurogenic claudication, with back surgery about 2 months ago, brought to the ER with increase confusion / worsening lower back pain. Work up at [employer] new lumbar compression fracture, tachycardic, leukocytosis, neurosurgery contacted and recommended MRI to be done and transferred to [facility] to rule out any infectious process.    Pt s/p procedure 8/22: EXPLORATION OF LUMBAR FUSION OPEN INTERNAL FIXATION OF L3 and L4 CHANCE FRACTURE'S  Pt spoke with pt's wife. Pt sitting in chair in pain. Wife reports pt's appetite has been poor since his surgery due to pain. Wife is not sure if pt has lost weight. Per readings below, has been stable. Pt likes Strawberry Ensure Enlive nutrition supplements. Will order. Labs and medications reviewed. K 3.0 (L).  Nutrition focused physical exam completed.  No muscle or subcutaneous fat depletion noticed.  Diet Order:  Diet Heart Room service appropriate? Yes; Fluid consistency: Thin  Skin:  Reviewed, no issues  Last BM:  8/23  Height:   Ht Readings from Last 1 Encounters:  12/09/16 5\' 9"  (1.753 m)   Weight:   Wt Readings from Last 1 Encounters:  12/09/16 217 lb 13 oz (98.8 kg)   Wt Readings from Last 10 Encounters:  12/09/16 217 lb 13 oz (98.8 kg)  09/23/16 219 lb (99.3 kg)  09/16/16 219 lb 1 oz (99.4 kg)  07/30/16 214 lb (97.1 kg)  07/28/16 213 lb 11.2 oz  (96.9 kg)  11/20/15 198 lb (89.8 kg)  07/23/15 204 lb (92.5 kg)  07/05/15 212 lb (96.2 kg)  07/05/15 212 lb (96.2 kg)  08/14/14 212 lb (96.2 kg)   Ideal Body Weight:  73 kg  BMI:  Body mass index is 32.17 kg/m.  Estimated Nutritional Needs:   Kcal:  2000-2200  Protein:  100-115 gm  Fluid:  2.0-2.2 L  EDUCATION NEEDS:   No education needs identified at this time  Arthur Holms, RD, LDN Pager #: [phone] After-Hours Pager #: [phone]

## 2016-12-11 NOTE — Evaluation (Signed)
Occupational Therapy Evaluation Patient Details Name: Derek [name] MRN: [id number] DOB: 09-Oct-1945 Today's Date: 12/11/2016    History of Present Illness 71 y.o. male admitted on 12/06/16 for AMS.  Suspected spinal abcess, so transferred from [facility] to [facility] for further assessment.  Pt dx with L3-4 chance fx and underwent exploration and lumbar fusion; open internal fixation of L3 and L4 Chance fracture's; open L3 and L4 kyphoplasty; posterior segmental instrumentation from L2-L5; redo posterior lateral arthrodesis L2-3, L3-4, L4-5 and L5-S.  Pt with significant PMH of recent lumbar fusion up to T10 (09/2016), PVD, HTN, COPD, Bell's palsey, R THA, R RTC repair, and multiple back surgeries.     Clinical Impression   PTA Pt requiring increased assistance from wife for ADL and mobility with DME. Pt is currently max A for LB ADL and set up for seating grooming tasks. Back handout provided and reviewed adls in detail. Pt educated on: clothing between brace, never sleep in brace, set an alarm at night for medication, avoid sitting for long periods of time, correct bed positioning for sleeping, correct sequence for bed mobility, avoiding lifting more than 5 pounds and never wash directly over incision. Pt's wife eager for education on caregiving assistance and Pt willing to work with therapy despite pain. Pt will require skilled OT in the acute setting and afterwards at CIR Level to continue and reinforce education in compensatory strategies and ensure safety and maximum independence in ADL, functional transfers, and use of AE.      Follow Up Recommendations  CIR;Supervision/Assistance - 24 hour    Equipment Recommendations  Other (comment) (defer to next venue)    Recommendations for Other Services Rehab consult     Precautions / Restrictions Precautions Precautions: Back Precaution Booklet Issued: No Precaution Comments: reviewed 3/3 back precautions with Pt and wife throughout session Required  Braces or Orthoses: Spinal Brace Spinal Brace: Thoracolumbosacral orthotic;Applied in supine position Restrictions Weight Bearing Restrictions: No      Mobility Bed Mobility Overal bed mobility: Needs Assistance Bed Mobility: Rolling;Sidelying to Sit Rolling: Min assist Sidelying to sit: Mod assist       General bed mobility comments: Min assist to help roll, mod assist to help lift trunk off of bed to get to sitting. verbal cues for sequencing  Transfers Overall transfer level: Needs assistance Equipment used: Rolling walker (2 wheeled) Transfers: Sit to/from Stand Sit to Stand: Mod assist         General transfer comment: Mod assist for power up and to support trunk during transitions, verbal cues for safe hand placement.    Balance Overall balance assessment: Needs assistance Sitting-balance support: Feet supported;Bilateral upper extremity supported Sitting balance-Leahy Scale: Fair Sitting balance - Comments: sitting EOB at supervision level   Standing balance support: Bilateral upper extremity supported Standing balance-Leahy Scale: Poor Standing balance comment: heavy reliance on RW and min assist from therapist in standing.                           ADL either performed or assessed with clinical judgement   ADL Overall ADL's : Needs assistance/impaired Eating/Feeding: Modified independent;Sitting Eating/Feeding Details (indicate cue type and reason): decreased appetite overall Grooming: Wash/dry hands;Wash/dry face;Oral care;Set up;Sitting Grooming Details (indicate cue type and reason): in recliner Upper Body Bathing: Maximal assistance;With caregiver independent assisting;Bed level Upper Body Bathing Details (indicate cue type and reason): wife has been bathing him, decreased initiation on his part but will if asked  by therapist Lower Body Bathing: Maximal assistance;With caregiver independent assisting   Upper Body Dressing : Total  assistance;Bed level Upper Body Dressing Details (indicate cue type and reason): to don brace by rolling in the bed Lower Body Dressing: Total assistance;Bed level;Sit to/from stand   Toilet Transfer: Moderate assistance;Ambulation;Comfort height toilet;Grab bars;RW   Toileting- Clothing Manipulation and Hygiene: Maximal assistance;Sitting/lateral lean Toileting - Clothing Manipulation Details (indicate cue type and reason): managing urinal sitting EOB, Pt able to use wash cloth to perform peri care aftewards Tub/ Shower Transfer: Moderate assistance;Ambulation;Tub bench;Rolling walker   Functional mobility during ADLs: Moderate assistance;Cueing for sequencing;Rolling walker General ADL Comments: Pt limited by cognition and pain during session, but will work through     [employer]      Pertinent Vitals/Pain Pain Assessment: 0-10 Pain Score: 10-Worst pain ever Pain Location: incisional Pain Descriptors / Indicators: Aching;Constant;Jabbing;Cramping Pain Intervention(s): Monitored during session;Repositioned     Hand Dominance Right   Extremity/Trunk Assessment Upper Extremity Assessment Upper Extremity Assessment: Overall WFL for tasks assessed   Lower Extremity Assessment Lower Extremity Assessment: Defer to PT evaluation   Cervical / Trunk Assessment Cervical / Trunk Assessment: Other exceptions Cervical / Trunk Exceptions: pt with h/o lumbar surgeries in the past.    Communication Communication Communication: Other (comment) (Pt very slow to respond to questions)   Cognition Arousal/Alertness: Lethargic Behavior During Therapy: Flat affect Overall Cognitive Status: Impaired/Different from baseline Area of Impairment: Memory;Problem solving                     Memory: Decreased short-term memory       Problem Solving: Slow processing;Difficulty sequencing;Requires verbal cues;Requires tactile cues General Comments: Pt slow  to process, seems hesitant to respond to questions from OT, Needs help sequencing movements   General Comments  Pt's wife present during session    Exercises     Shoulder Instructions      Home Living Family/patient expects to be discharged to:: Private residence Living Arrangements: Spouse/significant other Available Help at Discharge: Family;Available 24 hours/day Type of Home: House Home Access: Level entry     Home Layout: One level     Bathroom Shower/Tub: Teacher, early years/pre: Handicapped height     Home Equipment: Environmental consultant - 2 wheels;Wheelchair - manual;Tub bench          Prior Functioning/Environment Level of Independence: Needs assistance  Gait / Transfers Assistance Needed: pt had recently gotten to the point where he was unable to walk.  Wife assisting a lot at home.  ADL's / Homemaking Assistance Needed: recently had been increasing in dependence on wife for assist with bathing and dressing - pt self feeds            OT Problem List: Decreased strength;Decreased range of motion;Impaired balance (sitting and/or standing);Decreased cognition;Decreased safety awareness;Decreased knowledge of use of DME or AE;Decreased knowledge of precautions;Pain      OT Treatment/Interventions: Self-care/ADL training;Energy conservation;DME and/or AE instruction;Therapeutic activities;Balance training;Patient/family education    OT Goals(Current goals can be found in the care plan section) Acute Rehab OT Goals Patient Stated Goal: to go home, decrease pain OT Goal Formulation: With patient/family Time For Goal Achievement: 12/25/16 Potential to Achieve Goals: Good ADL Goals Pt Will Perform Grooming: with supervision;with caregiver independent in assisting;standing Pt Will Perform Upper Body Bathing: with supervision;sitting;with adaptive equipment Pt Will Perform Lower Body Bathing: with supervision;with caregiver  independent in assisting;sitting/lateral  leans;with adaptive equipment Pt Will Transfer to Toilet: with supervision;ambulating (caregiver independent in assisting; with RW) Pt Will Perform Toileting - Clothing Manipulation and hygiene: with supervision;with caregiver independent in assisting;with adaptive equipment;sit to/from stand Additional ADL Goal #1: Pt will don brace with mod A from caregiver at bedlevel without assist from therapist Additional ADL Goal #2: Pt will perform bed mobility at supervision level after donning brace to come to sit EOB prior to ADL activity  OT Frequency: Min 3X/week   Barriers to D/C:            Co-evaluation              AM-PAC PT "6 Clicks" Daily Activity     Outcome Measure Help from another person eating meals?: None Help from another person taking care of personal grooming?: A Little Help from another person toileting, which includes using toliet, bedpan, or urinal?: A Lot Help from another person bathing (including washing, rinsing, drying)?: A Lot Help from another person to put on and taking off regular upper body clothing?: A Lot Help from another person to put on and taking off regular lower body clothing?: Total 6 Click Score: 14   End of Session Equipment Utilized During Treatment: Gait belt;Rolling walker;Back brace Nurse Communication: Mobility status  Activity Tolerance: Patient tolerated treatment well (lethargy improved with movement) Patient left: in chair;with call bell/phone within reach;with chair alarm set;with family/visitor present  OT Visit Diagnosis: Unsteadiness on feet (R26.81);Other abnormalities of gait and mobility (R26.89);Muscle weakness (generalized) (M62.81);Pain Pain - Right/Left: Right Pain - part of body: Leg (back)                Time: 2878-6767 OT Time Calculation (min): 48 min Charges:  OT General Charges $OT Visit: 1 Procedure OT Evaluation $OT Eval Moderate Complexity: 1 Procedure OT Treatments $Self Care/Home Management : 23-37  mins G-Codes:     Hulda Humphrey OTR/L [place] 12/11/2016, 5:28 PM

## 2016-12-11 NOTE — Progress Notes (Signed)
Patient ID: Derek [name], male   DOB: Dec 29, 1945, 71 y.o.   MRN: [id number]          [facility] for Infectious Disease    Date of Admission:  12/06/2016   Day 6 vancomycin         The one positive blood culture from admission looks like it has a probable contaminant. The gram-positive coccus that was identified is not staph aureus. Final identification is still pending and we may not have final results until tomorrow. I will continue vancomycin and follow-up tomorrow.         Michel Bickers, MD [facility] for [facility] [phone] pager   [phone] cell 12/11/2016, 11:50 AM

## 2016-12-11 NOTE — Care Management Important Message (Signed)
Important Message  Patient Details  Name: Derek [name] MRN: [id number] Date of Birth: Mar 28, 1946   Medicare Important Message Given:  Yes    [name] Abena 12/11/2016, 11:02 AM

## 2016-12-11 NOTE — Progress Notes (Signed)
Patient ID: Derek [name], male   DOB: 01-07-1946, 71 y.o.   MRN: [id number] Subjective:  the patient is alert and pleasant. He looks better. His wife is at the bedside.  Objective: Vital signs in last 24 hours: Temp:  [97.7 F (36.5 C)-98.2 F (36.8 C)] 97.7 F (36.5 C) (08/24 0500) Pulse Rate:  [98-104] 101 (08/24 0500) Resp:  [17-20] 20 (08/24 0500) BP: (131-155)/(50-68) 131/53 (08/24 0500) SpO2:  [94 %-100 %] 98 % (08/24 0500)  Intake/Output from previous day: 08/23 0701 - 08/24 0700 In: 474 [P.O.:474] Out: 400 [Urine:400] Intake/Output this shift: No intake/output data recorded.  Physical exam  The patient is alert and pleasant. He is moving his lower extremities well.  Lab Results:  Recent Labs  12/10/16 0442 12/11/16 0435  WBC 9.6 10.1  HGB 9.3* 8.9*  HCT 29.7* 28.3*  PLT 439* 352   BMET  Recent Labs  12/10/16 0442 12/11/16 0435  NA 140 137  K 3.2* 3.0*  CL 105 100*  CO2 26 28  GLUCOSE 81 100*  BUN 8 5*  CREATININE 0.93 0.81  CALCIUM 8.2* 8.4*    Studies/Results: Dg Lumbar Spine 2-3 Views  Result Date: 12/09/2016 CLINICAL DATA:  Status post lumbar fusion and vertebroplasty. EXAM: LUMBAR SPINE - 2-3 VIEW; DG C-ARM 61-120 MIN FLUOROSCOPY TIME:  50 seconds. COMPARISON:  Radiographs of September 23, 2016. FINDINGS: Two intraoperative fluoroscopic images of the lumbar spine demonstrate the patient be status post posterior intrapedicular screw placement as well as vertebroplasty at 2 levels. Good alignment of vertebral bodies is noted. IMPRESSION: Status post surgical posterior fusion and vertebroplasty of lumbar spine. Electronically Signed   By: Marijo Conception, M.D.   On: 12/09/2016 21:28   Dg C-arm 1-60 Min  Result Date: 12/09/2016 CLINICAL DATA:  Status post lumbar fusion and vertebroplasty. EXAM: LUMBAR SPINE - 2-3 VIEW; DG C-ARM 61-120 MIN FLUOROSCOPY TIME:  50 seconds. COMPARISON:  Radiographs of September 23, 2016. FINDINGS: Two intraoperative fluoroscopic  images of the lumbar spine demonstrate the patient be status post posterior intrapedicular screw placement as well as vertebroplasty at 2 levels. Good alignment of vertebral bodies is noted. IMPRESSION: Status post surgical posterior fusion and vertebroplasty of lumbar spine. Electronically Signed   By: Marijo Conception, M.D.   On: 12/09/2016 21:28    Assessment/Plan: Postop day #2: The patient is improving. I'll ask rehabilitation to see the patient.  Hyponatremia: I will give him oral potassium.  LOS: 5 days     Deo Mehringer D 12/11/2016, 8:10 AM

## 2016-12-11 NOTE — Progress Notes (Signed)
Physical Therapy Treatment Patient Details Name: Derek [name] MRN: [id number] DOB: [birth year] Today's Date: 12/11/2016    History of Present Illness 71 y.o. male admitted on 12/06/16 for AMS.  Suspected spinal abcess, so transferred from [facility] to [facility] for further assessment.  Pt dx with L3-4 chance fx and underwent exploration and lumbar fusion; open internal fixation of L3 and L4 Chance fracture's; open L3 and L4 kyphoplasty; posterior segmental instrumentation from L2-L5; redo posterior lateral arthrodesis L2-3, L3-4, L4-5 and L5-S.  Pt with significant PMH of recent lumbar fusion up to T10 (09/2016), PVD, HTN, COPD, Bell's palsey, R THA, R RTC repair, and multiple back surgeries.      PT Comments    Pt is progressing well with mobility, he is more alert today, but still a bit confused and slow to process.  Wife is still fearful of taking him home and I still believe that he would benefit from intensive post acute therapy before going home with her.  PT will continue to follow acutely.  Follow Up Recommendations  CIR     Equipment Recommendations  None recommended by PT    Recommendations for Other Services Rehab consult     Precautions / Restrictions Precautions Precautions: Back Required Braces or Orthoses: Spinal Brace Spinal Brace: Thoracolumbosacral orthotic;Applied in supine position Restrictions Weight Bearing Restrictions: No    Mobility  Bed Mobility Overal bed mobility: Needs Assistance Bed Mobility: Rolling;Sidelying to Sit Rolling: Min assist Sidelying to sit: Mod assist       General bed mobility comments: Min assist to help roll, mod assist to help lift trunk off of bed to get to sitting.   Transfers Overall transfer level: Needs assistance Equipment used: Rolling walker (2 wheeled) Transfers: Sit to/from Stand Sit to Stand: Mod assist         General transfer comment: Mod assist to support trunk during transitions, verbal cues for safe hand  placement.  Ambulation/Gait Ambulation/Gait assistance: Min assist;+2 safety/equipment Ambulation Distance (Feet): 50 Feet (x2 with seated rest break) Assistive device: Rolling walker (2 wheeled) Gait Pattern/deviations: Step-through pattern;Shuffle;Trunk flexed Gait velocity: decreased Gait velocity interpretation: Below normal speed for age/gender General Gait Details: Verbal cues for upright posture and closer proximity to RW.  TLSO donned in supine prior to getting up.  Pt needed a sitting rest break 1/2 way due to pain and fatigue, but was able to walk again after.  Chair to follow for increased safety and to encourage increased gait distance.          Balance Overall balance assessment: Needs assistance Sitting-balance support: Feet supported;Bilateral upper extremity supported Sitting balance-Leahy Scale: Fair Sitting balance - Comments: bil arm support, not challenged in sitting, but more in midline today.   Standing balance support: Bilateral upper extremity supported Standing balance-Leahy Scale: Poor Standing balance comment: heavy reliance on RW and min assist from therapist in standing.                            Cognition Arousal/Alertness: Lethargic (better than yesterday) Behavior During Therapy: Flat affect Overall Cognitive Status: Impaired/Different from baseline Area of Impairment: Memory                     Memory: Decreased short-term memory       Problem Solving: Slow processing;Difficulty sequencing;Requires verbal cues;Requires tactile cues General Comments: Pt slow to process, wife asking him what he normally eats for breakfast at home, he  is unable to recall.  Needs help sequencing movement.             Pertinent Vitals/Pain Pain Assessment: 0-10 Pain Score: 10-Worst pain ever Pain Location: incisional Pain Descriptors / Indicators: Aching;Constant;Jabbing;Cramping Pain Intervention(s): Limited activity within patient's  tolerance;Monitored during session;Premedicated before session;Repositioned           PT Goals (current goals can now be found in the care plan section) Acute Rehab PT Goals Patient Stated Goal: to go home, decrease pain Progress towards PT goals: Progressing toward goals    Frequency    Min 5X/week      PT Plan Current plan remains appropriate       AM-PAC PT "6 Clicks" Daily Activity  Outcome Measure  Difficulty turning over in bed (including adjusting bedclothes, sheets and blankets)?: Unable Difficulty moving from lying on back to sitting on the side of the bed? : Unable Difficulty sitting down on and standing up from a chair with arms (e.g., wheelchair, bedside commode, etc,.)?: Unable Help needed moving to and from a bed to chair (including a wheelchair)?: A Lot Help needed walking in hospital room?: A Little Help needed climbing 3-5 steps with a railing? : A Lot 6 Click Score: 10    End of Session Equipment Utilized During Treatment: Back brace;Gait belt Activity Tolerance: Patient limited by pain;Patient limited by fatigue Patient left: in chair;with call bell/phone within reach;with chair alarm set;with family/visitor present   PT Visit Diagnosis: Muscle weakness (generalized) (M62.81);Difficulty in walking, not elsewhere classified (R26.2);Pain Pain - Right/Left:  (lower) Pain - part of body:  (back)     Time: 8242-3536 PT Time Calculation (min) (ACUTE ONLY): 29 min  Charges:  $Gait Training: 8-22 mins $Therapeutic Activity: 8-22 mins          Noriko Macari B. [place], [place], DPT [phone]            12/11/2016, 10:07 AM

## 2016-12-11 NOTE — Progress Notes (Signed)
I met with pt's wife at bedside to discuss a possible inpt rehab admit pending insurance approval. I will begin insurance once OT eval is completed. I have ordered OT eval and requested pt to be seen this afternoon. I will follow up on Monday with hopeful insurance decision . [phone]

## 2016-12-11 NOTE — Consult Note (Signed)
Physical Medicine and Rehabilitation Consult Reason for Consult: Decreased functional mobility Referring Physician: Dr. Arnoldo Morale   HPI: Derek [name] is a 71 y.o. right handed male with history of spinal stenosis with neurogenic claudication and multiple back surgeries most recently extension of his lumbar fusion up to T10 on 09/23/2016. Per chart review patient lives with wife. Was using a walker prior to admission with limited mobility. Wife can assist. Presented 12/06/2016 with increasing back pain followed outpatient by neurosurgery. He did have a recent fall one week ago. Lumbar CT demonstrated evidence of a Chance fracture at L3 and L4. Underwent exploration of lumbar fusion ORIF L3 and L4 Chance fracture's open L3 and L4 kyphoplasty with posterior segmental instrumentation from L2-L5 and redo posterior lateral arthrodesis 12/09/2016 per Dr. Arnoldo Morale. Hospital course pain management. TLSO back brace applied in the supine position. Bouts of hypokalemia with potassium supplement added. Physical therapy evaluation completed 12/10/2016 with recommendations of physical medicine rehabilitation consult.   Review of Systems  Constitutional: Negative for chills and fever.  HENT: Negative for hearing loss.   Eyes: Negative for blurred vision and double vision.  Respiratory: Negative for cough and shortness of breath.   Cardiovascular: Positive for leg swelling. Negative for chest pain and palpitations.  Gastrointestinal: Positive for constipation. Negative for nausea and vomiting.  Genitourinary: Positive for urgency. Negative for dysuria, flank pain and hematuria.  Musculoskeletal: Positive for back pain and myalgias.  Skin: Negative for rash.  Neurological: Positive for weakness. Negative for seizures.  All other systems reviewed and are negative.  Past Medical History:  Diagnosis Date  . Adenomatous polyps    remote past, on 5 year surveillance  . Arthritis   . Bell's palsy 40+yrs  ago  . Cancer ([place])    skin  . COPD (chronic obstructive pulmonary disease) (HCC)    emphysema, pt. states he is not aware of this  . Diverticulitis   . Dyspnea    pt. denies  . Enlarged prostate   . Flat affect   . Headache(784.0)    having recently  . Hip pain 11/2016  . Hypertension   . Peripheral vascular disease ([place])   . Pneumonia   . Sepsis ([place]) 11/2016   Past Surgical History:  Procedure Laterality Date  . BACK SURGERY  09/2016  . COLONOSCOPY  07/30/2005   Internal hemorrhoids; otherwise, normal rectum/ Left-sided diverticula  . COLONOSCOPY  01/11/2012   Procedure: COLONOSCOPY;  Surgeon: Daneil Dolin, MD;  Location: [facility];  Service: Endoscopy;  Laterality: N/A;  1:15  . ILIAC ARTERY STENT  2006   Left CIA stenting  . ROTATOR CUFF REPAIR Right    3 different surgeries  . SINUS ENDO W/FUSION Left 08/18/2013   Procedure: ENDOSCOPIC SINUS SURGERY WITH FUSION NAVIGATION;  Surgeon: Melissa Montane, MD;  Location: [place];  Service: ENT;  Laterality: Left;  . SINUS ENDO W/FUSION Left 12/21/2013   Procedure: ENDOSCOPIC SINUS SURGERY WITH FUSION NAVIGATION;  Surgeon: Melissa Montane, MD;  Location: [place];  Service: ENT;  Laterality: Left;  ESS with fusion left frontal sinus with Rain Stent  . SPINE SURGERY     lumbar & cervical spine surgeries X 8   . TOTAL HIP ARTHROPLASTY Right 11/20/2015   Procedure: RIGHT TOTAL HIP ARTHROPLASTY ANTERIOR APPROACH;  Surgeon: Gaynelle Arabian, MD;  Location: [facility];  Service: Orthopedics;  Laterality: Right;  Marland Kitchen VASECTOMY     Family History  Problem Relation Age of Onset  .  Heart disease Mother        After age 62  . Heart attack Father   . Heart disease Father        Heart Disease before age 34  . Colon cancer Neg Hx    Social History:  reports that he has been smoking Cigarettes.  He has a 104.00 pack-year smoking history. He has never used smokeless tobacco. He reports that he does not drink alcohol or use drugs. Allergies:  Allergies    Allergen Reactions  . Prednisone Other (See Comments)    Wife report it makes him "crazy as a bat".   Medications Prior to Admission  Medication Sig Dispense Refill  . ciprofloxacin (CIPRO) 500 MG tablet Take 1 tablet by mouth 2 (two) times daily.    . diazepam (VALIUM) 10 MG tablet Take 10 mg by mouth 4 (four) times daily. Anxiety    . gabapentin (NEURONTIN) 600 MG tablet Take 600 mg by mouth 3 (three) times daily.    Marland Kitchen lisinopril (PRINIVIL,ZESTRIL) 20 MG tablet Take 20 mg by mouth daily before breakfast.     . docusate sodium (COLACE) 100 MG capsule Take 1 capsule (100 mg total) by mouth 2 (two) times daily. (Patient not taking: Reported on 12/06/2016) 60 capsule 0  . lactulose (CHRONULAC) 10 GM/15ML solution Take 15-30 mLs by mouth at bedtime as needed for moderate constipation. As needed    . oxyCODONE 10 MG TABS Take 1 tablet (10 mg total) by mouth every 4 (four) hours as needed for moderate pain. (Patient not taking: Reported on 12/06/2016) 50 tablet 0    Home: Home Living Family/patient expects to be discharged to:: Private residence Living Arrangements: Spouse/significant other Available Help at Discharge: Family, Available 24 hours/day Type of Home: House Home Access: Level entry Home Layout: One level Bathroom Shower/Tub: Chiropodist: Handicapped height Home Equipment: Environmental consultant - 2 wheels, Wheelchair - manual, Tub bench  Functional History: Prior Function Level of Independence: Needs assistance Gait / Transfers Assistance Needed: pt had recently gotten to the point where he was unable to walk.  Wife assisting a lot at home.  Functional Status:  Mobility: Bed Mobility Overal bed mobility: Needs Assistance Bed Mobility: Rolling, Sidelying to Sit Rolling: Mod assist Sidelying to sit: Mod assist General bed mobility comments: Mod assist to help pt turn trun and pelvis to donn brace in bed, pt with verbal cues for log roll technique, wife assisting with  care,  Transfers Overall transfer level: Needs assistance Equipment used: Rolling walker (2 wheeled) Transfers: Sit to/from Stand, Stand Pivot Transfers Sit to Stand: +2 physical assistance, Mod assist Stand pivot transfers: +2 physical assistance, Mod assist General transfer comment: Two person mod assist to stand and pivot with RW to the recliner chair.  It took multiple attempts from elevated bed to get to standing wife helping to stabilize the RW, pt found to have stool in the bed and needed to stand two more times to get cleaned up.   Ambulation/Gait General Gait Details: unable at this time.     ADL:    Cognition: Cognition Overall Cognitive Status: Impaired/Different from baseline Orientation Level: Oriented X4 Cognition Arousal/Alertness: Lethargic Behavior During Therapy: Flat affect Overall Cognitive Status: Impaired/Different from baseline Area of Impairment: Problem solving Problem Solving: Difficulty sequencing, Requires verbal cues, Requires tactile cues General Comments: When asked to scoot backwards pt started scooting forwards, despite explaining who I am and what we were going to do pt very confused as I  started preparing him to get up OOB.   Blood pressure (!) 131/53, pulse (!) 101, temperature 97.7 F (36.5 C), temperature source Oral, resp. rate 20, height 5\' 9"  (1.753 m), weight 98.8 kg (217 lb 13 oz), SpO2 98 %. Physical Exam  Vitals reviewed. HENT:  Head: Normocephalic.  Eyes: EOM are normal.  Neck: Normal range of motion. Neck supple. No thyromegaly present.  Cardiovascular: Normal rate, regular rhythm and normal heart sounds.   Respiratory: Effort normal and breath sounds normal. No respiratory distress.  GI: Soft. Bowel sounds are normal. He exhibits no distension.  Neurological:  Lethargic but arousable. He would fall back asleep during exam. He does provide his name and age. Follows simple commands. Moves UE 4/5. Has difficulty with HF due to pain,  KE and ADF/PF grossly 2+ to 3/5 but limited due to lethargy too. Senses pain in all 4's.   Skin:  Back incision is dressed.    Results for orders placed or performed during the hospital encounter of 12/06/16 (from the past 24 hour(s))  Basic metabolic panel     Status: Abnormal   Collection Time: 12/11/16  4:35 AM  Result Value Ref Range   Sodium 137 135 - 145 mmol/L   Potassium 3.0 (L) 3.5 - 5.1 mmol/L   Chloride 100 (L) 101 - 111 mmol/L   CO2 28 22 - 32 mmol/L   Glucose, Bld 100 (H) 65 - 99 mg/dL   BUN 5 (L) 6 - 20 mg/dL   Creatinine, Ser 0.81 0.61 - 1.24 mg/dL   Calcium 8.4 (L) 8.9 - 10.3 mg/dL   GFR calc non Af Amer >60 >60 mL/min   GFR calc Af Amer >60 >60 mL/min   Anion gap 9 5 - 15  CBC     Status: Abnormal   Collection Time: 12/11/16  4:35 AM  Result Value Ref Range   WBC 10.1 4.0 - 10.5 K/uL   RBC 3.02 (L) 4.22 - 5.81 MIL/uL   Hemoglobin 8.9 (L) 13.0 - 17.0 g/dL   HCT 28.3 (L) 39.0 - 52.0 %   MCV 93.7 78.0 - 100.0 fL   MCH 29.5 26.0 - 34.0 pg   MCHC 31.4 30.0 - 36.0 g/dL   RDW 14.0 11.5 - 15.5 %   Platelets 352 150 - 400 K/uL   Dg Lumbar Spine 2-3 Views  Result Date: 12/09/2016 CLINICAL DATA:  Status post lumbar fusion and vertebroplasty. EXAM: LUMBAR SPINE - 2-3 VIEW; DG C-ARM 61-120 MIN FLUOROSCOPY TIME:  50 seconds. COMPARISON:  Radiographs of September 23, 2016. FINDINGS: Two intraoperative fluoroscopic images of the lumbar spine demonstrate the patient be status post posterior intrapedicular screw placement as well as vertebroplasty at 2 levels. Good alignment of vertebral bodies is noted. IMPRESSION: Status post surgical posterior fusion and vertebroplasty of lumbar spine. Electronically Signed   By: Marijo Conception, M.D.   On: 12/09/2016 21:28   Dg C-arm 1-60 Min  Result Date: 12/09/2016 CLINICAL DATA:  Status post lumbar fusion and vertebroplasty. EXAM: LUMBAR SPINE - 2-3 VIEW; DG C-ARM 61-120 MIN FLUOROSCOPY TIME:  50 seconds. COMPARISON:  Radiographs of September 23, 2016. FINDINGS: Two intraoperative fluoroscopic images of the lumbar spine demonstrate the patient be status post posterior intrapedicular screw placement as well as vertebroplasty at 2 levels. Good alignment of vertebral bodies is noted. IMPRESSION: Status post surgical posterior fusion and vertebroplasty of lumbar spine. Electronically Signed   By: Marijo Conception, M.D.   On: 12/09/2016 21:28  Assessment/Plan: Diagnosis: Functional deficits related to L3, L4 chance fractures requiring kyphoplasty and L2-L5 PLIF 1. Does the need for close, 24 hr/day medical supervision in concert with the patient's rehab needs make it unreasonable for this patient to be served in a less intensive setting? Yes 2. Co-Morbidities requiring supervision/potential complications: pain mgt/balancing side effects of meds, COPD, wound care 3. Due to bladder management, bowel management, safety, skin/wound care, disease management, medication administration, pain management and patient education, does the patient require 24 hr/day rehab nursing? Yes 4. Does the patient require coordinated care of a physician, rehab nurse, PT (1-2 hrs/day, 5 days/week) and OT (1-2 hrs/day, 5 days/week) to address physical and functional deficits in the context of the above medical diagnosis(es)? Yes Addressing deficits in the following areas: balance, endurance, locomotion, strength, transferring, bowel/bladder control, bathing, dressing, feeding, grooming, toileting and psychosocial support 5. Can the patient actively participate in an intensive therapy program of at least 3 hrs of therapy per day at least 5 days per week? Yes and Potentially 6. The potential for patient to make measurable gains while on inpatient rehab is excellent 7. Anticipated functional outcomes upon discharge from inpatient rehab are modified independent  with PT, modified independent, supervision and min assist with OT, n/a with SLP. 8. Estimated rehab length of stay to  reach the above functional goals is: 10-14 days 9. Anticipated D/C setting: Home 10. Anticipated post D/C treatments: HH therapy and Outpatient therapy 11. Overall Rehab/Functional Prognosis: excellent  RECOMMENDATIONS: This patient's condition is appropriate for continued rehabilitative care in the following setting: CIR Patient has agreed to participate in recommended program. Yes Note that insurance prior authorization may be required for reimbursement for recommended care.  Comment: Rehab Admissions Coordinator to follow up.  Thanks,  [name], MD, Mellody Drown    Cathlyn Parsons., PA-C 12/11/2016

## 2016-12-11 NOTE — Progress Notes (Signed)
PROGRESS NOTE    Derek [name]  KGU:[id number] DOB: 1946-02-17 DOA: 12/06/2016 PCP: Redmond School, MD (Confirm with patient/family/NH records and if not entered, this HAS to be entered at [facility] point of entry. "No PCP" if truly none.)   Brief Narrative: (Start on day 1 of progress note - keep it brief and live) The patient is Derek [name] 71 y.o.malewith hx of chronic pain syndrome, known to have spinal stenosis, neurogenic claudication, with back surgery about 2 months ago, brought to the ER with increase confusion/ worsening lower back pain. Work up at [employer] new lumbar compression fracture, tachycardic, leukocytosis, neurosurgery contacted and recommended MRI to be done and transferred to [facility] to rule out any infectious process. MRI ordered and pending and Neurosurgery evaluated and feel that patient will likely need surgical revision but awaiting MRI results to make further definitive treatment recommendations.   MRI with findings possibly suggestive of infection.  Pt to OR 8/22, low suspicion for infection based on operative findings.  Wound culture pending pending. 1/2 Bcx resulted positive and ID has been consulted.   Bcx pending.  Zosyn has been d/c'd.  ID following.  B12 injection today for B12 deficiency.    Assessment & Plan:   Principal Problem:   Positive blood culture Active Problems:   Pain in limb   Avascular necrosis of bone of right hip (HCC)   Lumbar stenosis with neurogenic claudication   Sepsis (HCC)   Altered mental status   Hyponatremia   Hypokalemia   Lactic acidosis   Normocytic anemia   Anxiety   COPD (chronic obstructive pulmonary disease) (HCC)   Back pain   Compression fracture of lumbar vertebra (HCC)   Thrombocytosis (HCC)   Status post lumbar spinal fusion   Worsening lower back pain/Lumbago with Giovanny Dugal New Compression fractures by the CAT scan at L3 and L4 rule out Infection - MRI lumbar/thoracic spine with compression fx at L3/L4 (noted  concern for osteo, but findings less specific with compression fracture).  Also with edema/enhancement within medial R psoas (consider myositis).  Posterior paraspinal fluid collection L3-S1.  T10-S1 posterior spinal fusion without stenosis.   -CT Thoracic Spine showed No acute, traumatic or unexpected finding in the thoracic region. The patient has recently at thoracolumbar fusion. In the thoracic region, this includes pedicle screws and posterior rods at T10, T11 and T12 which have good appearances. No abnormality seen above that. Distant ACDF C6-7 -CT Lumbar Spine showed Extension of the fusion from T10-L3. Surgical discontinuity of the posterior rods at the 3/4 level with old solid fusion from L4 to the sacrum. Chance type fracture at the L3 level extending in an axial plane through the vertebral body and posterior elements. No antero or retrolisthesis. Compression fracture of the L4 vertebral body at the superior aspect with loss of height of about 25%. - Now status post exploration, open internal fixation of L3/L4 Chance Fractures; posterior segmental instrumentation from L2-L5; redo posterior lateral arthrodesis L2-3, L3-4, L4-5 and L5-S1 with local morselized autograft bone, bone morphogenic protein-soaked collagen sponges, and Bone graft extender    - Neurosurgery with low suspicion for infection, based on operative findings [ ]  pending surgical cx -Continue with IV antibiotics with IV Zosy; IV Vancomycin pharmacy to dose -> d/c zosyn per ID -1/2 Bcx positive, ID following appreciate recs [ ]  follow speciation  -Pain Control with Acetaminophen 650 mg po/RC q6hprn mild pain, Morphine 1-3 mg q4hprn Moderate Severe Pain, Muscle Rub Cream, Topically, and Robaxin 550  mg po Daily on MAR -Bowel Regimen with Docusate Sodium 100 mg po BID and Lactulose 10-20 grams po qHSprn for Moderate Constipation -C/w D5 NS at 100 mL/hr  Acute Encephalopathy with Increased Confusion, improved  -Did not Take More  than Required Pain Medication -PCP thought patient had Prostatitis and was started on Ciprofloxacin as an outpatient (negative UA on 8/19)  -CT Head showed No acute intracranial process. Sinus disease as above -Gabapentin and Diazepam Home medications currently held  -Continue to Treat Pain  -UDS Positive for Benzos  -C/w IVF Rehydration as Above  ? Sepsis; ? Lumbar Infection - 1/2 bcx positive for gram postive cocci, ID consulted -As above -normal WBC today -C/w IVF with D5W NS at 100 mL/hr -Continue with IV antibiotics with IV Zosyn/vancomycin per pharmacy -> d/c zosyn per ID  Hypertension -Blood pressure elevated -Resume lisinopril  Hx of COPD/Emphysema -Currently not decompensated -Continue to Monitor Respiratory Status and if necessary add Nebs  Anxiety -Takes Diazepam 10 mg 4 times Daily but currently held -Got 2.5 mg of Valium this AM and 5 mg of Valium last night -May need to restart Valium Slowly -Monitor for Withdrawals   Thrombocytosis - resolved -Continue to Monitor and Repeat CBC in AM  Hyponatremia - resolved  Hypokalemia -Continue to Monitor and Replete As Necessary -Repeat CMP in AM  Lactic Acidosis  - Resolved  Normocytic Anemia -Unknown Etiology so will obtain Anemia Panel - suggestive of AOCD, also with low B12, will plan to start b12 injections tomorrow  -Continue to Monitor for S/Sx of Bleeding and Repeat CBC in AM  DVT prophylaxis: SCD Code Status: full  Family Communication: wife Disposition Plan: pending surgery   Consultants:   Neurosurgery   Procedures: (Don't include imaging studies which can be auto populated. Include things that cannot be auto populated i.e. Echo, Carotid and venous dopplers, Foley, Bipap, HD, tubes/drains, wound vac, central lines etc) Exploration of lumbar fusion; open internal fixation of L3 and L4 Chance fracture's; open L3 and L4 kyphoplasty; posterior segmental instrumentation from L2-L5; redo  posterior lateral arthrodesis L2-3, L3-4, L4-5 and L5-S1 with local morselized autograft bone, bone morphogenic protein-soaked collagen sponges, and Bone graft extender  Antimicrobials: (specify start and planned stop date. Auto populated tables are space occupying and do not give end dates)  vanc   Subjective: Sitting up with brace on.  Back pain mostly is main complaint.  No fevers.   Objective: Vitals:   12/10/16 2100 12/11/16 0117 12/11/16 0500 12/11/16 0856  BP: (!) 133/55 (!) 141/50 (!) 131/53 (!) 151/60  Pulse: 98 100 (!) 101 (!) 101  Resp: 20 20 20 20   Temp: 98.2 F (36.8 C) 98.1 F (36.7 C) 97.7 F (36.5 C) 98.3 F (36.8 C)  TempSrc: Oral Oral Oral Axillary  SpO2: 97% 96% 98% 99%  Weight:      Height:        Intake/Output Summary (Last 24 hours) at 12/11/16 1043 Last data filed at 12/10/16 1726  Gross per 24 hour  Intake              474 ml  Output                0 ml  Net              474 ml   Filed Weights   12/06/16 1254 12/09/16 1428 12/09/16 2349  Weight: 99.3 kg (219 lb) 99.3 kg (219 lb) 98.8 kg (217 lb 13 oz)  Examination:  General exam: NAD, brace on Respiratory system: CTAB, unlabored Cardiovascular system: no mgr, rrr Gastrointestinal system: s/nt/nd Central nervous system: Selita Staiger&O, no focal deficits Extremities: no LEE Skin: brace in place, not able to look at back Psychiatry: Judgement and insight appear normal. Mood & affect appropriate.     Data Reviewed: I have personally reviewed following labs and imaging studies  CBC:  Recent Labs Lab 12/06/16 1324 12/07/16 0507 12/08/16 0921 12/09/16 0530 12/10/16 0442 12/11/16 0435  WBC 11.8* 10.7* 11.0* 9.8 9.6 10.1  NEUTROABS 8.1*  --  7.2 5.8  --   --   HGB 11.4* 10.9* 10.4* 10.3* 9.3* 8.9*  HCT 34.8* 32.6* 32.1* 31.9* 29.7* 28.3*  MCV 95.1 95.3 94.1 94.9 96.1 93.7  PLT 433* 426* 435* 452* 439* 270   Basic Metabolic Panel:  Recent Labs Lab 12/07/16 0507 12/08/16 0921  12/09/16 0530 12/10/16 0442 12/11/16 0435  NA 134* 137 139 140 137  K 3.4* 3.5 3.0* 3.2* 3.0*  CL 100* 102 103 105 100*  CO2 25 27 26 26 28   GLUCOSE 115* 95 89 81 100*  BUN 6 7 7 8  5*  CREATININE 0.73 0.95 0.91 0.93 0.81  CALCIUM 8.4* 8.5* 8.5* 8.2* 8.4*  MG  --  1.7 1.9  --   --   PHOS  --  3.6 3.8  --   --    GFR: Estimated Creatinine Clearance: 98.3 mL/min (by C-G formula based on SCr of 0.81 mg/dL). Liver Function Tests:  Recent Labs Lab 12/06/16 1324 12/08/16 0921 12/09/16 0530  AST 40 38 30  ALT 33 34 31  ALKPHOS 119 97 95  BILITOT 0.4 0.7 0.6  PROT 6.8 5.7* 5.7*  ALBUMIN 3.1* 2.6* 2.6*    Recent Labs Lab 12/06/16 1324  LIPASE 31   No results for input(s): AMMONIA in the last 168 hours. Coagulation Profile: No results for input(s): INR, PROTIME in the last 168 hours. Cardiac Enzymes: No results for input(s): CKTOTAL, CKMB, CKMBINDEX, TROPONINI in the last 168 hours. BNP (last 3 results) No results for input(s): PROBNP in the last 8760 hours. HbA1C: No results for input(s): HGBA1C in the last 72 hours. CBG:  Recent Labs Lab 12/06/16 1303  GLUCAP 95   Lipid Profile: No results for input(s): CHOL, HDL, LDLCALC, TRIG, CHOLHDL, LDLDIRECT in the last 72 hours. Thyroid Function Tests: No results for input(s): TSH, T4TOTAL, FREET4, T3FREE, THYROIDAB in the last 72 hours. Anemia Panel:  Recent Labs  12/09/16 0530  VITAMINB12 104*  FOLATE 18.0  FERRITIN 199  TIBC 213*  IRON 52  RETICCTPCT 1.1   Sepsis Labs:  Recent Labs Lab 12/06/16 1400 12/09/16 0530 12/10/16 0442  PROCALCITON  --  <0.10 <0.10  LATICACIDVEN 2.14* 0.7  --     Recent Results (from the past 240 hour(s))  Blood Culture (routine x 2)     Status: None (Preliminary result)   Collection Time: 12/06/16  1:36 PM  Result Value Ref Range Status   Specimen Description BLOOD RIGHT ANTECUBITAL  Final   Special Requests Blood Culture adequate volume  Final   Culture  Setup Time    Final    GRAM POSITIVE COCCI AEROBIC BOTTLE Gram Stain Report Called to,Read Back By and Verified With: NORMAN,B @0010  BY MATTHEWS,B 8.23.18 [place]  Final   Report Status PENDING  Incomplete  Blood Culture ID Panel (Reflexed)     Status: None   Collection Time:  12/06/16  1:36 PM  Result Value Ref Range Status   Enterococcus species NOT DETECTED NOT DETECTED Final   Listeria monocytogenes NOT DETECTED NOT DETECTED Final   Staphylococcus species NOT DETECTED NOT DETECTED Final   Staphylococcus aureus NOT DETECTED NOT DETECTED Final   Streptococcus species NOT DETECTED NOT DETECTED Final   Streptococcus agalactiae NOT DETECTED NOT DETECTED Final   Streptococcus pneumoniae NOT DETECTED NOT DETECTED Final   Streptococcus pyogenes NOT DETECTED NOT DETECTED Final   Acinetobacter baumannii NOT DETECTED NOT DETECTED Final   Enterobacteriaceae species NOT DETECTED NOT DETECTED Final   Enterobacter cloacae complex NOT DETECTED NOT DETECTED Final   Escherichia coli NOT DETECTED NOT DETECTED Final   Klebsiella oxytoca NOT DETECTED NOT DETECTED Final   Klebsiella pneumoniae NOT DETECTED NOT DETECTED Final   Proteus species NOT DETECTED NOT DETECTED Final   Serratia marcescens NOT DETECTED NOT DETECTED Final   Haemophilus influenzae NOT DETECTED NOT DETECTED Final   Neisseria meningitidis NOT DETECTED NOT DETECTED Final   Pseudomonas aeruginosa NOT DETECTED NOT DETECTED Final   Candida albicans NOT DETECTED NOT DETECTED Final   Candida glabrata NOT DETECTED NOT DETECTED Final   Candida krusei NOT DETECTED NOT DETECTED Final   Candida parapsilosis NOT DETECTED NOT DETECTED Final   Candida tropicalis NOT DETECTED NOT DETECTED Final    Comment: Performed at [facility], [address] [place], [place] [address]  Blood Culture (routine x 2)     Status: None   Collection Time: 12/06/16  1:43 PM  Result Value Ref Range Status   Specimen  Description BLOOD LEFT ANTECUBITAL  Final   Special Requests Blood Culture adequate volume  Final   Culture NO GROWTH 5 DAYS  Final   Report Status 12/11/2016 FINAL  Final  Surgical pcr screen     Status: None   Collection Time: 12/09/16  1:44 PM  Result Value Ref Range Status   MRSA, PCR NEGATIVE NEGATIVE Final   Staphylococcus aureus NEGATIVE NEGATIVE Final    Comment:        The Xpert SA Assay (FDA approved for NASAL specimens in patients over 72 years of age), is one component of [facility] Pettey comprehensive surveillance program.  Test performance has been validated by [facility] for patients greater than or equal to 11 year old. It is not intended to diagnose infection nor to guide or monitor treatment.   Aerobic/Anaerobic Culture (surgical/deep wound)     Status: None (Preliminary result)   Collection Time: 12/09/16  6:18 PM  Result Value Ref Range Status   Specimen Description WOUND  Final   Special Requests SPINE SURGICAL WOUND PT ON ZENSYN  Final   Gram Stain   Final    RARE WBC PRESENT, PREDOMINANTLY MONONUCLEAR NO ORGANISMS SEEN    Culture NO GROWTH < 24 HOURS  Final   Report Status PENDING  Incomplete         Radiology Studies: Dg Lumbar Spine 2-3 Views  Result Date: 12/09/2016 CLINICAL DATA:  Status post lumbar fusion and vertebroplasty. EXAM: LUMBAR SPINE - 2-3 VIEW; DG C-ARM 61-120 MIN FLUOROSCOPY TIME:  50 seconds. COMPARISON:  Radiographs of September 23, 2016. FINDINGS: Two intraoperative fluoroscopic images of the lumbar spine demonstrate the patient be status post posterior intrapedicular screw placement as well as vertebroplasty at 2 levels. Good alignment of vertebral bodies is noted. IMPRESSION: Status post surgical posterior fusion and vertebroplasty of lumbar spine. Electronically Signed   By: Marijo Conception, M.D.  On: 12/09/2016 21:28   Dg C-arm 1-60 Min  Result Date: 12/09/2016 CLINICAL DATA:  Status post lumbar fusion and vertebroplasty. EXAM: LUMBAR SPINE -  2-3 VIEW; DG C-ARM 61-120 MIN FLUOROSCOPY TIME:  50 seconds. COMPARISON:  Radiographs of September 23, 2016. FINDINGS: Two intraoperative fluoroscopic images of the lumbar spine demonstrate the patient be status post posterior intrapedicular screw placement as well as vertebroplasty at 2 levels. Good alignment of vertebral bodies is noted. IMPRESSION: Status post surgical posterior fusion and vertebroplasty of lumbar spine. Electronically Signed   By: Marijo Conception, M.D.   On: 12/09/2016 21:28        Scheduled Meds: . docusate sodium  100 mg Oral BID  . potassium chloride  40 mEq Oral BID  . sodium chloride flush  3 mL Intravenous Q12H  . sodium chloride flush  3 mL Intravenous Q12H   Continuous Infusions: . sodium chloride 250 mL (12/11/16 0902)  . dextrose 5 % and 0.9% NaCl 100 mL/hr at 12/09/16 0809  . lactated ringers 10 mL/hr at 12/09/16 1427  . methocarbamol (ROBAXIN)  IV 500 mg (12/11/16 0547)  . vancomycin 1,250 mg (12/11/16 0901)     LOS: 5 days    Time spent: 35 minutes    Tsugio Elison Melven Sartorius, MD [facility] Pager [phone]  If 7PM-7AM, please contact night-coverage [email or web address] Password [facility] 12/11/2016, 10:43 AM

## 2016-12-11 NOTE — Progress Notes (Signed)
Pharmacy Antibiotic Note  Derek [name] is a 71 y.o. male admitted on 12/06/2016 with sepsis, lumbar infection, hardware present.    Continues on vancomycin pending result of blood culture, likely contaminant?  Scr stable, afebrile  Last vancomycin trough 22 on 8/21, dose adjusted  Plan: Continue Vancomycin 1250mg  IV q12 hours Plan vancomycin trough over the weekend if continues  Height: 5\' 9"  (175.3 cm) Weight: 217 lb 13 oz (98.8 kg) IBW/kg (Calculated) : 70.7  Temp (24hrs), Avg:98.1 F (36.7 C), Min:97.7 F (36.5 C), Max:98.3 F (36.8 C)   Recent Labs Lab 12/06/16 1400 12/07/16 0507 12/08/16 0921 12/08/16 1757 12/09/16 0530 12/10/16 0442 12/11/16 0435 12/11/16 1039  WBC  --  10.7* 11.0*  --  9.8 9.6 10.1  --   CREATININE  --  0.73 0.95  --  0.91 0.93 0.81  --   LATICACIDVEN 2.14*  --   --   --  0.7  --   --   --   VANCOTROUGH  --   --   --  22*  --   --   --  58*    Estimated Creatinine Clearance: 98.3 mL/min (by C-G formula based on SCr of 0.81 mg/dL).    Allergies  Allergen Reactions  . Prednisone Other (See Comments)    Wife report it makes him "crazy as a bat".    Antimicrobials this admission: Vancomycin  8/19 >>  Zosyn 8/19 >> 8/23  Thank you for allowing pharmacy to be a part of this patient's care. Anette Guarneri, PharmD [phone] 12/11/2016 1:47 PM

## 2016-12-12 DIAGNOSIS — E538 Deficiency of other specified B group vitamins: Secondary | ICD-10-CM

## 2016-12-12 LAB — CULTURE, BLOOD (ROUTINE X 2): Special Requests: ADEQUATE

## 2016-12-12 LAB — BASIC METABOLIC PANEL
Anion gap: 10 (ref 5–15)
BUN: 6 mg/dL (ref 6–20)
CO2: 26 mmol/L (ref 22–32)
Calcium: 8.6 mg/dL — ABNORMAL LOW (ref 8.9–10.3)
Chloride: 99 mmol/L — ABNORMAL LOW (ref 101–111)
Creatinine, Ser: 0.87 mg/dL (ref 0.61–1.24)
GFR calc Af Amer: >60 mL/min (ref >60)
GFR calc non Af Amer: >60 mL/min (ref >60)
Glucose, Bld: 166 mg/dL — ABNORMAL HIGH (ref 65–99)
Potassium: 3.1 mmol/L — ABNORMAL LOW (ref 3.5–5.1)
Sodium: 135 mmol/L (ref 135–145)

## 2016-12-12 LAB — CBC
HCT: 27.5 % — ABNORMAL LOW (ref 39.0–52.0)
Hemoglobin: 8.9 g/dL — ABNORMAL LOW (ref 13.0–17.0)
MCH: 30.4 pg (ref 26.0–34.0)
MCHC: 32.4 g/dL (ref 30.0–36.0)
MCV: 93.9 fL (ref 78.0–100.0)
Platelets: 411 10*3/uL — ABNORMAL HIGH (ref 150–400)
RBC: 2.93 MIL/uL — ABNORMAL LOW (ref 4.22–5.81)
RDW: 14.1 % (ref 11.5–15.5)
WBC: 12 10*3/uL — ABNORMAL HIGH (ref 4.0–10.5)

## 2016-12-12 LAB — PROCALCITONIN: PROCALCITONIN: 0.13 ng/mL

## 2016-12-12 LAB — VANCOMYCIN, TROUGH: VANCOMYCIN TR: 20 ug/mL (ref 15–20)

## 2016-12-12 MED ORDER — VANCOMYCIN HCL IN DEXTROSE 1-5 GM/200ML-% IV SOLN: 1000.0000 mg | Freq: Two times a day (BID) | INTRAVENOUS | Status: DC

## 2016-12-12 MED ORDER — VANCOMYCIN HCL IN DEXTROSE 1-5 GM/200ML-% IV SOLN
1000.0000 mg | Freq: Two times a day (BID) | INTRAVENOUS | Status: DC
  Filled 2016-12-12: qty 200

## 2016-12-12 NOTE — Progress Notes (Signed)
Neurosurgery Progress Note  No issues overnight.  Pain fairly manageable on current regimen Met with CIR coordinator yesterday. Will pursue this.  No complains this am  EXAM:  BP (!) 135/49 (BP Location: Left Arm)   Pulse 98   Temp 98.7 F (37.1 C) (Oral)   Resp 20   Ht 5' 9"  (1.753 m)   Wt 98.8 kg (217 lb 13 oz)   SpO2 96%   BMI 32.17 kg/m   Awake, alert, oriented  Speech fluent, appropriate  CN grossly intact  MAEW Wound c/d/i  PLAN Progressing nicely Repeat BMP today to recheck potassium after oral supplementation Hopefully CIR Monday

## 2016-12-12 NOTE — Progress Notes (Signed)
Pharmacy Antibiotic Note  Derek [name] is a 71 y.o. male admitted on 12/06/2016 with sepsis, lumbar infection, hardware present.    Scr stable at 0/87, Tmax 99.8, WBC 12.0  VT 20 on 8/25 on 1250 mg IV q12 hours  8/19 BCx showed coag neg staph - possible contaminate  Plan: Vancomycin 1000mg  IV q12 hours Monitor renal function, cultures, and steady-state troughs as needed.  Height: 5\' 9"  (175.3 cm) Weight: 217 lb 13 oz (98.8 kg) IBW/kg (Calculated) : 70.7  Temp (24hrs), Avg:98.8 F (37.1 C), Min:98.2 F (36.8 C), Max:99.8 F (37.7 C)   Recent Labs Lab 12/06/16 1400  12/08/16 0921  12/09/16 0530 12/10/16 0442 12/11/16 0435 12/11/16 1039 12/12/16 1042  WBC  --   < > 11.0*  --  9.8 9.6 10.1  --  12.0*  CREATININE  --   < > 0.95  --  0.91 0.93 0.81  --  0.87  LATICACIDVEN 2.14*  --   --   --  0.7  --   --   --   --   VANCOTROUGH  --   --   --   < >  --   --   --  58* 20  < > = values in this interval not displayed.  Estimated Creatinine Clearance: 91.5 mL/min (by C-G formula based on SCr of 0.87 mg/dL).    Allergies  Allergen Reactions  . Prednisone Other (See Comments)    Wife report it makes him "crazy as a bat".    Antimicrobials this admission: Vancomycin  8/19 >>  Zosyn 8/19 >> 8/23  Thank you for allowing pharmacy to be a part of this patient's care. Angus Seller, PharmD Pharmacy Resident Pager: [phone] 12/12/2016 2:05 PM

## 2016-12-12 NOTE — Progress Notes (Signed)
PT Cancellation Note  Patient Details Name: Derek [name] MRN: [id number] DOB: 1945-05-27   Cancelled Treatment:    Reason Eval/Treat Not Completed: Other (comment).  Made two attempts to see pt and he was asleep with meds both times.  He will be seen tomorrow as he can tolerate with his pain and meds.   Ramond Dial 12/12/2016, 4:58 PM   Mee Hives, PT MS Acute Rehab Dept. Number: [place] [facility] [place]

## 2016-12-12 NOTE — Progress Notes (Signed)
Patient ID: Derek [name], male   DOB: Oct 28, 1945, 71 y.o.   MRN: [id number]          [facility]    Date of Admission:  12/06/2016   Day 7 vancomycin         The one positive blood culture obtained on admission has grown coagulase-negative staph that is a likely contaminant. I will stop vancomycin now. He does not need any further cultures at this time. I will sign off now.         Michel Bickers, MD [facility] [phone] pager   [phone] cell 12/12/2016, 3:02 PM

## 2016-12-12 NOTE — Progress Notes (Signed)
PROGRESS NOTE    Derek [name]  NUU:[id number] DOB: 09-23-1945 DOA: 12/06/2016 PCP: Redmond School, MD (Confirm with patient/family/NH records and if not entered, this HAS to be entered at [facility] point of entry. "No PCP" if truly none.)   Brief Narrative: (Start on day 1 of progress note - keep it brief and live) The patient is Derek [name] 71 y.o.malewith hx of chronic pain syndrome, known to have spinal stenosis, neurogenic claudication, with back surgery about 2 months ago, brought to the ER with increase confusion/ worsening lower back pain. Work up at [employer] new lumbar compression fracture, tachycardic, leukocytosis, neurosurgery contacted and recommended MRI to be done and transferred to [facility] to rule out any infectious process. MRI ordered and pending and Neurosurgery evaluated and feel that patient will likely need surgical revision but awaiting MRI results to make further definitive treatment recommendations.   MRI with findings possibly suggestive of infection.  Pt to OR 8/22, low suspicion for infection based on operative findings.  Wound culture pending pending. 1/2 Bcx resulted positive and ID has been consulted.   Bcx pending.  Zosyn has been d/c'd.  ID following.  B12 injection today for B12 deficiency.   1/2 bcx with coagulase negative staph.  Likely contaminant.  Will discuss d/c vanc with ID.    Assessment & Plan:   Principal Problem:   Positive blood culture Active Problems:   Pain in limb   Avascular necrosis of bone of right hip (HCC)   Lumbar stenosis with neurogenic claudication   Sepsis (HCC)   Altered mental status   Hyponatremia   Hypokalemia   Lactic acidosis   Normocytic anemia   Anxiety   COPD (chronic obstructive pulmonary disease) (HCC)   Back pain   Compression fracture of lumbar vertebra (HCC)   Thrombocytosis (HCC)   Status post lumbar spinal fusion   Worsening lower back pain/Lumbago with Derek [name] New Compression fractures by the CAT scan at L3  and L4 rule out Infection - MRI lumbar/thoracic spine with compression fx at L3/L4 (noted concern for osteo, but findings less specific with compression fracture).  Also with edema/enhancement within medial R psoas (consider myositis).  Posterior paraspinal fluid collection L3-S1.  T10-S1 posterior spinal fusion without stenosis.   -CT Thoracic Spine showed No acute, traumatic or unexpected finding in the thoracic region. The patient has recently at thoracolumbar fusion. In the thoracic region, this includes pedicle screws and posterior rods at T10, T11 and T12 which have good appearances. No abnormality seen above that. Distant ACDF C6-7 -CT Lumbar Spine showed Extension of the fusion from T10-L3. Surgical discontinuity of the posterior rods at the 3/4 level with old solid fusion from L4 to the sacrum. Chance type fracture at the L3 level extending in an axial plane through the vertebral body and posterior elements. No antero or retrolisthesis. Compression fracture of the L4 vertebral body at the superior aspect with loss of height of about 25%. - Now status post exploration, open internal fixation of L3/L4 Chance Fractures; posterior segmental instrumentation from L2-L5; redo posterior lateral arthrodesis L2-3, L3-4, L4-5 and L5-S1 with local morselized autograft bone, bone morphogenic protein-soaked collagen sponges, and Bone graft extender    - Neurosurgery with low suspicion for infection, based on operative findings [ ]  pending surgical cx NGTD -Continue with IV antibiotics with IV Zosy; IV Vancomycin pharmacy to dose -> d/c zosyn per ID [ ]  1/2 Bcx positive, coagulase negative, likely contaminant. Likely d/c today.  ID following appreciate recs.   -  Pain Control with Acetaminophen 650 mg po/RC q6hprn mild pain, Muscle Rub Cream, flexeril, robaxin, and oxycodone 5-10 mg q3 hrs prn -Bowel Regimen with Docusate Sodium 100 mg po BID and Lactulose 10-20 grams po qHSprn for Moderate Constipation -C/w D5  NS at 100 mL/hr  Acute Encephalopathy with Increased Confusion, improved  -Did not Take More than Required Pain Medication -PCP thought patient had Prostatitis and was started on Ciprofloxacin as an outpatient (negative UA on 8/19)  -CT Head showed No acute intracranial process. Sinus disease as above -Gabapentin and Diazepam Home medications currently held  -Continue to Treat Pain  -UDS Positive for Benzos  -C/w IVF Rehydration as Above  ? Sepsis; ? Lumbar Infection - 1/2 bcx positive for coagulase negative staph which is likely contaminant ID consulted -As above -C/w IVF with D5W NS at 100 mL/hr -Continue with IV antibiotics with IV Zosyn/vancomycin per pharmacy -> d/c zosyn per ID Of note vanc trough yesterday not true trough, discussed with pharmacy and trough pending for today, suspect will be able to d/c vanc today.  Hypertension -Blood pressure elevated -home lisinopril  Hx of COPD/Emphysema -Currently not decompensated -Continue to Monitor Respiratory Status and if necessary add Nebs  Anxiety -Takes Diazepam 10 mg 4 times Daily but currently held -Got 2.5 mg of Valium this AM and 5 mg of Valium last night -May need to restart Valium Slowly -Monitor for Withdrawals   Thrombocytosis   -Continue to Monitor and Repeat CBC in AM  Leukocytosis: - mild today.  No other si/sx of infection.  Ctm.   Hyponatremia - resolved  Hypokalemia -Continue to Monitor and Replete As Necessary -Repeat CMP in AM  Lactic Acidosis  - Resolved  Normocytic Anemia -Unknown Etiology so will obtain Anemia Panel - suggestive of AOCD - vit B12 104, low - Low B12, s/p 1000 mcg vit B12 on 8/24.  Plan for once weekly x 4 weeks then monthly.  F/u outpatient.  -Continue to Monitor for S/Sx of Bleeding and Repeat CBC in AM  DVT prophylaxis: SCD Code Status: full  Family Communication: wife Disposition Plan: pending surgery   Consultants:   Neurosurgery   Procedures: (Don't  include imaging studies which can be auto populated. Include things that cannot be auto populated i.e. Echo, Carotid and venous dopplers, Foley, Bipap, HD, tubes/drains, wound vac, central lines etc) Exploration of lumbar fusion; open internal fixation of L3 and L4 Chance fracture's; open L3 and L4 kyphoplasty; posterior segmental instrumentation from L2-L5; redo posterior lateral arthrodesis L2-3, L3-4, L4-5 and L5-S1 with local morselized autograft bone, bone morphogenic protein-soaked collagen sponges, and Bone graft extender  Antimicrobials: (specify start and planned stop date. Auto populated tables are space occupying and do not give end dates)  vanc   Subjective: Sleeping in bed.  Denies any complaints at this time.    Objective: Vitals:   12/11/16 2102 12/12/16 0104 12/12/16 0500 12/12/16 0958  BP: 127/63 (!) 146/60 (!) 135/49 (!) 143/63  Pulse: 86 90 98 93  Resp: 20 18 20 20   Temp: 99 F (37.2 C) 99.8 F (37.7 C) 98.7 F (37.1 C) 98.2 F (36.8 C)  TempSrc: Oral Oral Oral Oral  SpO2: 97% 100% 96% 98%  Weight:      Height:        Intake/Output Summary (Last 24 hours) at 12/12/16 1142 Last data filed at 12/12/16 0900  Gross per 24 hour  Intake              970  ml  Output                0 ml  Net              970 ml   Filed Weights   12/06/16 1254 12/09/16 1428 12/09/16 2349  Weight: 99.3 kg (219 lb) 99.3 kg (219 lb) 98.8 kg (217 lb 13 oz)    Examination:  General: No acute distress. Cardiovascular: Heart sounds show Derek [name] regular rate, and rhythm. No gallops or rubs. No murmurs. No JVD. Lungs: Clear to auscultation bilaterally with good air movement. No rales, rhonchi or wheezes. Abdomen: Soft, nontender, nondistended with normal active bowel sounds. No masses. No hepatosplenomegaly. Neurological: Sleepy.  Skin: well healing midline back incision with steri strips Extremities: No clubbing or cyanosis. No edema.  Psychiatric: Mood and affect are normal. Insight and  judgment are appropriate.  Data Reviewed: I have personally reviewed following labs and imaging studies  CBC:  Recent Labs Lab 12/06/16 1324  12/08/16 0921 12/09/16 0530 12/10/16 0442 12/11/16 0435 12/12/16 1042  WBC 11.8*  < > 11.0* 9.8 9.6 10.1 12.0*  NEUTROABS 8.1*  --  7.2 5.8  --   --   --   HGB 11.4*  < > 10.4* 10.3* 9.3* 8.9* 8.9*  HCT 34.8*  < > 32.1* 31.9* 29.7* 28.3* 27.5*  MCV 95.1  < > 94.1 94.9 96.1 93.7 93.9  PLT 433*  < > 435* 452* 439* 352 411*  < > = values in this interval not displayed. Basic Metabolic Panel:  Recent Labs Lab 12/08/16 0921 12/09/16 0530 12/10/16 0442 12/11/16 0435 12/12/16 1042  NA 137 139 140 137 135  K 3.5 3.0* 3.2* 3.0* 3.1*  CL 102 103 105 100* 99*  CO2 27 26 26 28 26   GLUCOSE 95 89 81 100* 166*  BUN 7 7 8  5* 6  CREATININE 0.95 0.91 0.93 0.81 0.87  CALCIUM 8.5* 8.5* 8.2* 8.4* 8.6*  MG 1.7 1.9  --   --   --   PHOS 3.6 3.8  --   --   --    GFR: Estimated Creatinine Clearance: 91.5 mL/min (by C-G formula based on SCr of 0.87 mg/dL). Liver Function Tests:  Recent Labs Lab 12/06/16 1324 12/08/16 0921 12/09/16 0530  AST 40 38 30  ALT 33 34 31  ALKPHOS 119 97 95  BILITOT 0.4 0.7 0.6  PROT 6.8 5.7* 5.7*  ALBUMIN 3.1* 2.6* 2.6*    Recent Labs Lab 12/06/16 1324  LIPASE 31   No results for input(s): AMMONIA in the last 168 hours. Coagulation Profile: No results for input(s): INR, PROTIME in the last 168 hours. Cardiac Enzymes: No results for input(s): CKTOTAL, CKMB, CKMBINDEX, TROPONINI in the last 168 hours. BNP (last 3 results) No results for input(s): PROBNP in the last 8760 hours. HbA1C: No results for input(s): HGBA1C in the last 72 hours. CBG:  Recent Labs Lab 12/06/16 1303  GLUCAP 95   Lipid Profile: No results for input(s): CHOL, HDL, LDLCALC, TRIG, CHOLHDL, LDLDIRECT in the last 72 hours. Thyroid Function Tests: No results for input(s): TSH, T4TOTAL, FREET4, T3FREE, THYROIDAB in the last 72  hours. Anemia Panel: No results for input(s): VITAMINB12, FOLATE, FERRITIN, TIBC, IRON, RETICCTPCT in the last 72 hours. Sepsis Labs:  Recent Labs Lab 12/06/16 1400 12/09/16 0530 12/10/16 0442 12/12/16 0436  PROCALCITON  --  <0.10 <0.10 0.13  LATICACIDVEN 2.14* 0.7  --   --     Recent Results (from  the past 240 hour(s))  Blood Culture (routine x 2)     Status: Abnormal (Preliminary result)   Collection Time: 12/06/16  1:36 PM  Result Value Ref Range Status   Specimen Description BLOOD RIGHT ANTECUBITAL  Final   Special Requests Blood Culture adequate volume  Final   Culture  Setup Time   Final    GRAM POSITIVE COCCI AEROBIC BOTTLE Gram Stain Report Called to,Read Back By and Verified With: NORMAN,B @0010  BY MATTHEWS,B 8.23.18 [facility]     Culture (Derek [name])  Final    STAPHYLOCOCCUS SPECIES (COAGULASE NEGATIVE) THE SIGNIFICANCE OF ISOLATING THIS ORGANISM FROM Derek [name] SINGLE SET OF BLOOD CULTURES WHEN MULTIPLE SETS ARE DRAWN IS UNCERTAIN. PLEASE NOTIFY THE MICROBIOLOGY DEPARTMENT WITHIN ONE WEEK IF SPECIATION AND SENSITIVITIES ARE REQUIRED. Performed at [facility], [place] [address] [place], [place] [address]    Report Status PENDING  Incomplete  Blood Culture ID Panel (Reflexed)     Status: None   Collection Time: 12/06/16  1:36 PM  Result Value Ref Range Status   Enterococcus species NOT DETECTED NOT DETECTED Final   Listeria monocytogenes NOT DETECTED NOT DETECTED Final   Staphylococcus species NOT DETECTED NOT DETECTED Final   Staphylococcus aureus NOT DETECTED NOT DETECTED Final   Streptococcus species NOT DETECTED NOT DETECTED Final   Streptococcus agalactiae NOT DETECTED NOT DETECTED Final   Streptococcus pneumoniae NOT DETECTED NOT DETECTED Final   Streptococcus pyogenes NOT DETECTED NOT DETECTED Final   Acinetobacter baumannii NOT DETECTED NOT DETECTED Final   Enterobacteriaceae species NOT DETECTED NOT DETECTED Final   Enterobacter cloacae complex NOT  DETECTED NOT DETECTED Final   Escherichia coli NOT DETECTED NOT DETECTED Final   Klebsiella oxytoca NOT DETECTED NOT DETECTED Final   Klebsiella pneumoniae NOT DETECTED NOT DETECTED Final   Proteus species NOT DETECTED NOT DETECTED Final   Serratia marcescens NOT DETECTED NOT DETECTED Final   Haemophilus influenzae NOT DETECTED NOT DETECTED Final   Neisseria meningitidis NOT DETECTED NOT DETECTED Final   Pseudomonas aeruginosa NOT DETECTED NOT DETECTED Final   Candida albicans NOT DETECTED NOT DETECTED Final   Candida glabrata NOT DETECTED NOT DETECTED Final   Candida krusei NOT DETECTED NOT DETECTED Final   Candida parapsilosis NOT DETECTED NOT DETECTED Final   Candida tropicalis NOT DETECTED NOT DETECTED Final    Comment: Performed at [facility], [place] [address], [place],  [address]  Blood Culture (routine x 2)     Status: None   Collection Time: 12/06/16  1:43 PM  Result Value Ref Range Status   Specimen Description BLOOD LEFT ANTECUBITAL  Final   Special Requests Blood Culture adequate volume  Final   Culture NO GROWTH 5 DAYS  Final   Report Status 12/11/2016 FINAL  Final  Surgical pcr screen     Status: None   Collection Time: 12/09/16  1:44 PM  Result Value Ref Range Status   MRSA, PCR NEGATIVE NEGATIVE Final   Staphylococcus aureus NEGATIVE NEGATIVE Final    Comment:        The Xpert SA Assay (FDA approved for NASAL specimens in patients over 53 years of age), is one component of Derek [name] comprehensive surveillance program.  Test performance has been validated by [facility] for patients greater than or equal to 70 year old. It is not intended to diagnose infection nor to guide or monitor treatment.   Aerobic/Anaerobic Culture (surgical/deep wound)     Status: None (Preliminary result)  Collection Time: 12/09/16  6:18 PM  Result Value Ref Range Status   Specimen Description WOUND  Final   Special Requests SPINE SURGICAL WOUND PT ON ZENSYN  Final   Gram  Stain   Final    RARE WBC PRESENT, PREDOMINANTLY MONONUCLEAR NO ORGANISMS SEEN    Culture   Final    NO GROWTH 3 DAYS NO ANAEROBES ISOLATED; CULTURE IN PROGRESS FOR 5 DAYS   Report Status PENDING  Incomplete         Radiology Studies: No results found.      Scheduled Meds: . docusate sodium  100 mg Oral BID  . feeding supplement (ENSURE ENLIVE)  237 mL Oral TID BM  . lisinopril  20 mg Oral QAC breakfast  . potassium chloride  40 mEq Oral BID  . sodium chloride flush  3 mL Intravenous Q12H  . sodium chloride flush  3 mL Intravenous Q12H   Continuous Infusions: . sodium chloride 250 mL (12/11/16 0902)  . dextrose 5 % and 0.9% NaCl 100 mL/hr at 12/09/16 0809  . lactated ringers 10 mL/hr at 12/09/16 1427  . methocarbamol (ROBAXIN)  IV Stopped (12/12/16 0715)  . vancomycin Stopped (12/11/16 2225)     LOS: 6 days    Time spent: 35 minutes    Derek [name] Melven Sartorius, MD [facility] Pager [phone]  If 7PM-7AM, please contact night-coverage [email or web address] Password [facility] 12/12/2016, 11:42 AM

## 2016-12-13 LAB — BASIC METABOLIC PANEL
ANION GAP: 9 (ref 5–15)
BUN: 5 mg/dL — ABNORMAL LOW (ref 6–20)
CHLORIDE: 100 mmol/L — AB (ref 101–111)
CO2: 28 mmol/L (ref 22–32)
Calcium: 8.6 mg/dL — ABNORMAL LOW (ref 8.9–10.3)
Creatinine, Ser: 0.78 mg/dL (ref 0.61–1.24)
GFR calc Af Amer: >60 mL/min (ref >60)
GFR calc non Af Amer: >60 mL/min (ref >60)
GLUCOSE: 107 mg/dL — AB (ref 65–99)
POTASSIUM: 3.5 mmol/L (ref 3.5–5.1)
Sodium: 137 mmol/L (ref 135–145)

## 2016-12-13 LAB — CBC
HEMATOCRIT: 26.7 % — AB (ref 39.0–52.0)
HEMOGLOBIN: 8.6 g/dL — AB (ref 13.0–17.0)
MCH: 30.5 pg (ref 26.0–34.0)
MCHC: 32.2 g/dL (ref 30.0–36.0)
MCV: 94.7 fL (ref 78.0–100.0)
Platelets: 421 10*3/uL — ABNORMAL HIGH (ref 150–400)
RBC: 2.82 MIL/uL — AB (ref 4.22–5.81)
RDW: 14.2 % (ref 11.5–15.5)
WBC: 10.3 10*3/uL (ref 4.0–10.5)

## 2016-12-13 MED ORDER — DICLOFENAC SODIUM 1 % TD GEL
2.0000 g | Freq: Four times a day (QID) | TRANSDERMAL | Status: DC
  Administered 2016-12-13 – 2016-12-14 (×6): 2 g via TOPICAL
  Filled 2016-12-13: qty 100

## 2016-12-13 NOTE — Progress Notes (Signed)
Physical Therapy Treatment Patient Details Name: Derek [name] MRN: [id number] DOB: 07-15-1945 Today's Date: 12/13/2016    History of Present Illness 71 y.o. male admitted on 12/06/16 for AMS.  Suspected spinal abcess, so transferred from [name] to [facility] for further assessment.  Pt dx with L3-4 chance fx and underwent exploration and lumbar fusion; open internal fixation of L3 and L4 Chance fracture's; open L3 and L4 kyphoplasty; posterior segmental instrumentation from L2-L5; redo posterior lateral arthrodesis L2-3, L3-4, L4-5 and L5-S.  Pt with significant PMH of recent lumbar fusion up to T10 (09/2016), PVD, HTN, COPD, Bell's palsey, R THA, R RTC repair, and multiple back surgeries.      PT Comments    Continuing work on functional mobility and activity tolerance;  Still with the greatest difficulty with bed mobility; Min assist of 2 for safety with amb, noted hips and knees flexed throughout gait cycle; verba and tactile cueing to correct; moves slowly, but making notable progress with activity tolerance; Continue to recommend comprehensive inpatient rehab (CIR) for post-acute therapy needs.   Follow Up Recommendations  CIR     Equipment Recommendations  None recommended by PT    Recommendations for Other Services Rehab consult     Precautions / Restrictions Precautions Precautions: Back Precaution Comments: reviewed 3/3 back precautions with Pt and wife throughout session Required Braces or Orthoses: Spinal Brace Spinal Brace: Thoracolumbosacral orthotic;Applied in supine position Restrictions Weight Bearing Restrictions: No    Mobility  Bed Mobility Overal bed mobility: Needs Assistance Bed Mobility: Rolling;Sidelying to Sit Rolling: Max assist Sidelying to sit: Mod assist;+2 for physical assistance;+2 for safety/equipment       General bed mobility comments: Assist to roll side to side for donning brace, assist to bring trunk into upright position and initial steadying  assist; requires verbal sequencing cues to perform log roll technique   Transfers Overall transfer level: Needs assistance Equipment used: Rolling walker (2 wheeled) Transfers: Sit to/from Stand Sit to Stand: Mod assist Stand pivot transfers: +2 physical assistance;Mod assist;+2 safety/equipment       General transfer comment: Mod assist for power up and to support trunk during transitions, verbal cues for safe hand placement.  Ambulation/Gait Ambulation/Gait assistance: Min assist;+2 safety/equipment Ambulation Distance (Feet): 65 Feet Assistive device: Rolling walker (2 wheeled) Gait Pattern/deviations: Step-through pattern;Shuffle;Trunk flexed Gait velocity: decreased   General Gait Details: Verbal cues for upright posture and closer proximity to RW.  Noted tendency to have hips and knees slightly flexed throughout gait cycle; able to correct some with verbal and tactile cueing, but corrections extinguish quickly   Stairs            Wheelchair Mobility    Modified Rankin (Stroke Patients Only)       Balance Overall balance assessment: Needs assistance Sitting-balance support: Feet supported;Bilateral upper extremity supported Sitting balance-Leahy Scale: Fair Sitting balance - Comments: able to maintain static sitting EOB after initial assist provided to steady upon sitting up    Standing balance support: Bilateral upper extremity supported;Single extremity supported;During functional activity Standing balance-Leahy Scale: Poor Standing balance comment:  reliance on RW and min assist from therapist in standing during mobility and while performing grooming ADLs at sink.                            Cognition Arousal/Alertness: Awake/alert Behavior During Therapy: Flat affect Overall Cognitive Status: Impaired/Different from baseline Area of Impairment: Memory;Problem solving  Memory: Decreased short-term memory        Problem Solving: Slow processing;Requires verbal cues;Requires tactile cues General Comments: requires increased time and multimodal cues throughout session during task completion      Exercises      General Comments General comments (skin integrity, edema, etc.): Pt's wife present during session       Pertinent Vitals/Pain Pain Assessment: 0-10 Pain Score: 5  Pain Location: lower back at incision site  Pain Descriptors / Indicators: Aching;Grimacing;Discomfort Pain Intervention(s): Monitored during session    Home Living                      Prior Function            PT Goals (current goals can now be found in the care plan section) Acute Rehab PT Goals Patient Stated Goal: to go home, decrease pain PT Goal Formulation: With patient/family Time For Goal Achievement: 12/24/16 Potential to Achieve Goals: Good Progress towards PT goals: Progressing toward goals    Frequency    Min 5X/week      PT Plan Current plan remains appropriate    Co-evaluation PT/OT/SLP Co-Evaluation/Treatment: Yes Reason for Co-Treatment: For patient/therapist safety PT goals addressed during session: Mobility/safety with mobility OT goals addressed during session: ADL's and self-care      AM-PAC PT "6 Clicks" Daily Activity  Outcome Measure  Difficulty turning over in bed (including adjusting bedclothes, sheets and blankets)?: Unable Difficulty moving from lying on back to sitting on the side of the bed? : Unable Difficulty sitting down on and standing up from a chair with arms (e.g., wheelchair, bedside commode, etc,.)?: Unable Help needed moving to and from a bed to chair (including a wheelchair)?: A Lot Help needed walking in hospital room?: A Little Help needed climbing 3-5 steps with a railing? : A Lot 6 Click Score: 10    End of Session Equipment Utilized During Treatment: Back brace;Gait belt Activity Tolerance: Patient tolerated treatment well Patient left: in  chair;with call bell/phone within reach;with chair alarm set;with family/visitor present Nurse Communication: Mobility status PT Visit Diagnosis: Muscle weakness (generalized) (M62.81);Difficulty in walking, not elsewhere classified (R26.2);Pain Pain - part of body:  (back)     Time: 9675-9163 PT Time Calculation (min) (ACUTE ONLY): 34 min  Charges:  $Gait Training: 8-22 mins                    G Codes:       Roney Marion, PT  Acute Rehabilitation Services Pager [phone] Office [phone]    [place] 12/13/2016, 5:33 PM

## 2016-12-13 NOTE — Progress Notes (Signed)
PROGRESS NOTE    Derek Derek [name]  RDE:[id number] DOB: May 05, 1945 DOA: 12/06/2016 PCP: Redmond School, MD (Confirm with patient/family/NH records and if not entered, this HAS to be entered at [facility] point of entry. "No PCP" if truly none.)   Brief Narrative: (Start on day 1 of progress note - keep it brief and live) The patient is Derek Derek [name] 71 y.o.malewith hx of chronic pain Derek [name], Derek Derek [name], Derek Derek [name], with back surgery about 2 months ago, brought to the ER with increase confusion/ worsening lower back pain. Work up at [employer] new lumbar compression fracture, tachycardic, leukocytosis, neurosurgery contacted and recommended MRI to be done and transferred to [facility] to rule out any infectious process. MRI ordered and pending and Neurosurgery evaluated and feel that patient will likely need surgical revision but awaiting MRI results to make further definitive treatment recommendations.   MRI with findings possibly suggestive of infection.  Pt to OR 8/22, low suspicion for infection based on operative findings.  Wound culture pending pending. 1/2 Bcx resulted positive and ID has been consulted.   Bcx pending.  Zosyn has been d/c'd.  ID following.  B12 injection today for B12 deficiency.   1/2 bcx with coagulase negative staph.  Likely contaminant.  Antibiotics d/c'd.    Assessment & Plan:   Principal Problem:   Positive blood culture Active Problems:   Pain in limb   Avascular necrosis of bone of right hip (HCC)   Lumbar Derek [name] with Derek Derek [name]   Sepsis (HCC)   Altered mental status   Hyponatremia   Hypokalemia   Lactic acidosis   Normocytic anemia   Anxiety   COPD (chronic obstructive pulmonary disease) (HCC)   Back pain   Compression fracture of lumbar vertebra (HCC)   Thrombocytosis (HCC)   Status post lumbar spinal fusion   B12 deficiency   Worsening lower back pain/Lumbago with Derek Derek [name] New Compression fractures by the CAT scan  at L3 and L4 rule out Infection - MRI lumbar/thoracic spine with compression fx at L3/L4 (noted concern for osteo, but findings less specific with compression fracture).  Also with edema/enhancement within medial R psoas (consider myositis).  Posterior paraspinal fluid collection L3-S1.  T10-S1 posterior spinal fusion without Derek [name].   -CT Thoracic Spine showed No acute, traumatic or unexpected finding in the thoracic region. The patient has recently at thoracolumbar fusion. In the thoracic region, this includes pedicle screws and posterior rods at T10, T11 and T12 which have good appearances. No abnormality seen above that. Distant ACDF C6-7 -CT Lumbar Spine showed Extension of the fusion from T10-L3. Surgical discontinuity of the posterior rods at the 3/4 level with old solid fusion from L4 to the sacrum. Chance type fracture at the L3 level extending in an axial plane through the vertebral body and posterior elements. No antero or retrolisthesis. Compression fracture of the L4 vertebral body at the superior aspect with loss of height of about 25%. - Now status post exploration, open internal fixation of L3/L4 Chance Fractures; posterior segmental instrumentation from L2-L5; redo posterior lateral arthrodesis L2-3, L3-4, L4-5 and L5-S1 with local morselized autograft bone, bone morphogenic protein-soaked collagen sponges, and Bone graft extender    - Neurosurgery with low suspicion for infection, based on operative findings [ ]  pending surgical cx NGTD x 3  [ ]  abx discontinued per ID, 1/2 bcx positive for coag neg staph, likely contaminant (micrococcus).   -Pain Control with Acetaminophen 650 mg po/RC q6hprn mild pain, Muscle Rub Cream,  flexeril, robaxin, and oxycodone 5-10 mg q3 hrs prn -Bowel Regimen with Docusate Sodium 100 mg po BID and Lactulose 10-20 grams po qHSprn for Moderate Constipation -C/w D5 NS at 100 mL/hr  Acute Encephalopathy with Increased Confusion, improved  -Did not Take  More than Required Pain Medication -PCP thought patient had Prostatitis and was started on Ciprofloxacin as an outpatient (negative UA on 8/19)  -CT Head showed No acute intracranial process. Sinus disease as above -Gabapentin and Diazepam Home medications currently held  -Continue to Treat Pain  -UDS Positive for Benzos  -C/w IVF Rehydration as Above  ? Sepsis; ? Lumbar Infection:  Pt with vanc/zosyn initially, which was narrowed to vanc, and finally discontinued.  1/2 bcx with coag negative staph thought contaminant.  Likely not true infection, nsgy thought operative findings not suggestive and cx have been negative. - 1/2 bcx positive for coagulase negative staph, thought contaminant as above  R thigh pain: MSK pain to R thigh, unclear cause, but no swelling, redness, bruising noted.  Will try voltaren, continue to monitor.   Hypertension -Blood pressure elevated -home lisinopril  Hx of COPD/Emphysema -Currently not decompensated -Continue to Monitor Respiratory Status and if necessary add Nebs  Anxiety -Takes Diazepam 10 mg 4 times Daily but currently held -Got 2.5 mg of Valium this AM and 5 mg of Valium last night -May need to restart Valium Slowly -Monitor for Withdrawals   Thrombocytosis   -Continue to Monitor and Repeat CBC in AM  Leukocytosis: - resolved   Hyponatremia - resolved  Hypokalemia -Continue to Monitor and Replete As Necessary -Repeat CMP in AM  Lactic Acidosis  - Resolved  Normocytic Anemia -Unknown Etiology so will obtain Anemia Panel - suggestive of AOCD - vit B12 104, low - Low B12, s/p 1000 mcg vit B12 on 8/24.  Plan for once weekly x 4 weeks then monthly.  F/u outpatient.  -Continue to Monitor for S/Sx of Bleeding and Repeat CBC in AM  DVT prophylaxis: SCD Code Status: full  Family Communication: wife Disposition Plan: pending surgery   Consultants:   Neurosurgery   Procedures: (Don't include imaging studies which can be  auto populated. Include things that cannot be auto populated i.e. Echo, Carotid and venous dopplers, Foley, Bipap, HD, tubes/drains, wound vac, central lines etc) Exploration of lumbar fusion; open internal fixation of L3 and L4 Chance fracture's; open L3 and L4 kyphoplasty; posterior segmental instrumentation from L2-L5; redo posterior lateral arthrodesis L2-3, L3-4, L4-5 and L5-S1 with local morselized autograft bone, bone morphogenic protein-soaked collagen sponges, and Bone graft extender  Antimicrobials: (specify start and planned stop date. Auto populated tables are space occupying and do not give end dates)  vanc   Subjective: Sleeping in bed.  Notes R thigh pain for the past few days.  Unsure what caused this.  Maybe the way he's been sleeping in bed? Positioning during surgery?  Otherwise doing ok.   Objective: Vitals:   12/12/16 2154 12/13/16 0135 12/13/16 0555 12/13/16 0831  BP: (!) 124/44 (!) 141/66 (!) 143/56 (!) 141/74  Pulse: 89 93 94 87  Resp: 20 20 20 20   Temp: 97.9 F (36.6 C) 97.9 F (36.6 C) 98.3 F (36.8 C)   TempSrc: Oral Oral Oral   SpO2: 97% 96% 98% 95%  Weight:      Height:        Intake/Output Summary (Last 24 hours) at 12/13/16 1122 Last data filed at 12/13/16 0342  Gross per 24 hour  Intake  994.73 ml  Output              350 ml  Net           644.73 ml   Filed Weights   12/06/16 1254 12/09/16 1428 12/09/16 2349  Weight: 99.3 kg (219 lb) 99.3 kg (219 lb) 98.8 kg (217 lb 13 oz)    Examination:  General: No acute distress. Cardiovascular: Heart sounds show Aking Klabunde regular rate, and rhythm. No gallops or rubs. No murmurs. No JVD. Lungs: Clear to auscultation bilaterally with good air movement. No rales, rhonchi or wheezes. Abdomen: Soft, nontender, nondistended with normal active bowel sounds. No masses. No hepatosplenomegaly. Neurological: Alert and oriented 3. Moves all extremities. Cranial nerves II through XII grossly intact. Skin: Warm  and dry. No rashes or lesions.  Back not examined Extremities: No clubbing or cyanosis. No edema.  R thigh mild ttp.  No redness, swelling.  Psychiatric: Mood and affect are normal.   Data Reviewed: I have personally reviewed following labs and imaging studies  CBC:  Recent Labs Lab 12/06/16 1324  12/08/16 0921 12/09/16 0530 12/10/16 0442 12/11/16 0435 12/12/16 1042 12/13/16 0610  WBC 11.8*  < > 11.0* 9.8 9.6 10.1 12.0* 10.3  NEUTROABS 8.1*  --  7.2 5.8  --   --   --   --   HGB 11.4*  < > 10.4* 10.3* 9.3* 8.9* 8.9* 8.6*  HCT 34.8*  < > 32.1* 31.9* 29.7* 28.3* 27.5* 26.7*  MCV 95.1  < > 94.1 94.9 96.1 93.7 93.9 94.7  PLT 433*  < > 435* 452* 439* 352 411* 421*  < > = values in this interval not displayed. Basic Metabolic Panel:  Recent Labs Lab 12/08/16 0921 12/09/16 0530 12/10/16 0442 12/11/16 0435 12/12/16 1042 12/13/16 0610  NA 137 139 140 137 135 137  K 3.5 3.0* 3.2* 3.0* 3.1* 3.5  CL 102 103 105 100* 99* 100*  CO2 27 26 26 28 26 28   GLUCOSE 95 89 81 100* 166* 107*  BUN 7 7 8  5* 6 5*  CREATININE 0.95 0.91 0.93 0.81 0.87 0.78  CALCIUM 8.5* 8.5* 8.2* 8.4* 8.6* 8.6*  MG 1.7 1.9  --   --   --   --   PHOS 3.6 3.8  --   --   --   --    GFR: Estimated Creatinine Clearance: 99.5 mL/min (by C-G formula based on SCr of 0.78 mg/dL). Liver Function Tests:  Recent Labs Lab 12/06/16 1324 12/08/16 0921 12/09/16 0530  AST 40 38 30  ALT 33 34 31  ALKPHOS 119 97 95  BILITOT 0.4 0.7 0.6  PROT 6.8 5.7* 5.7*  ALBUMIN 3.1* 2.6* 2.6*    Recent Labs Lab 12/06/16 1324  LIPASE 31   No results for input(s): AMMONIA in the last 168 hours. Coagulation Profile: No results for input(s): INR, PROTIME in the last 168 hours. Cardiac Enzymes: No results for input(s): CKTOTAL, CKMB, CKMBINDEX, TROPONINI in the last 168 hours. BNP (last 3 results) No results for input(s): PROBNP in the last 8760 hours. HbA1C: No results for input(s): HGBA1C in the last 72  hours. CBG:  Recent Labs Lab 12/06/16 1303  GLUCAP 95   Lipid Profile: No results for input(s): CHOL, HDL, LDLCALC, TRIG, CHOLHDL, LDLDIRECT in the last 72 hours. Thyroid Function Tests: No results for input(s): TSH, T4TOTAL, FREET4, T3FREE, THYROIDAB in the last 72 hours. Anemia Panel: No results for input(s): VITAMINB12, FOLATE, FERRITIN, TIBC, IRON, RETICCTPCT in  the last 72 hours. Sepsis Labs:  Recent Labs Lab 12/06/16 1400 12/09/16 0530 12/10/16 0442 12/12/16 0436  PROCALCITON  --  <0.10 <0.10 0.13  LATICACIDVEN 2.14* 0.7  --   --     Recent Results (from the past 240 hour(s))  Blood Culture (routine x 2)     Status: Abnormal   Collection Time: 12/06/16  1:36 PM  Result Value Ref Range Status   Specimen Description BLOOD RIGHT ANTECUBITAL  Final   Special Requests Blood Culture adequate volume  Final   Culture  Setup Time   Final    GRAM POSITIVE COCCI AEROBIC BOTTLE Gram Stain Report Called to,Read Back By and Verified With: NORMAN,B @0010  BY MATTHEWS,B 8.23.18 [facility]     Culture (Enrica Corliss)  Final    MICROCOCCUS SPECIES Standardized susceptibility testing for this organism is not available. Performed at [facility], [place] [address], [place], [place] [address]    Report Status 12/12/2016 FINAL  Final  Blood Culture ID Panel (Reflexed)     Status: None   Collection Time: 12/06/16  1:36 PM  Result Value Ref Range Status   Enterococcus species NOT DETECTED NOT DETECTED Final   Listeria monocytogenes NOT DETECTED NOT DETECTED Final   Staphylococcus species NOT DETECTED NOT DETECTED Final   Staphylococcus aureus NOT DETECTED NOT DETECTED Final   Streptococcus species NOT DETECTED NOT DETECTED Final   Streptococcus agalactiae NOT DETECTED NOT DETECTED Final   Streptococcus pneumoniae NOT DETECTED NOT DETECTED Final   Streptococcus pyogenes NOT DETECTED NOT DETECTED Final   Acinetobacter baumannii NOT DETECTED NOT DETECTED Final   Enterobacteriaceae  species NOT DETECTED NOT DETECTED Final   Enterobacter cloacae complex NOT DETECTED NOT DETECTED Final   Escherichia coli NOT DETECTED NOT DETECTED Final   Klebsiella oxytoca NOT DETECTED NOT DETECTED Final   Klebsiella pneumoniae NOT DETECTED NOT DETECTED Final   Proteus species NOT DETECTED NOT DETECTED Final   Serratia marcescens NOT DETECTED NOT DETECTED Final   Haemophilus influenzae NOT DETECTED NOT DETECTED Final   Neisseria meningitidis NOT DETECTED NOT DETECTED Final   Pseudomonas aeruginosa NOT DETECTED NOT DETECTED Final   Candida albicans NOT DETECTED NOT DETECTED Final   Candida glabrata NOT DETECTED NOT DETECTED Final   Candida krusei NOT DETECTED NOT DETECTED Final   Candida parapsilosis NOT DETECTED NOT DETECTED Final   Candida tropicalis NOT DETECTED NOT DETECTED Final    Comment: Performed at [facility], [place] [address] [place], [place] [address]  Blood Culture (routine x 2)     Status: None   Collection Time: 12/06/16  1:43 PM  Result Value Ref Range Status   Specimen Description BLOOD LEFT ANTECUBITAL  Final   Special Requests Blood Culture adequate volume  Final   Culture NO GROWTH 5 DAYS  Final   Report Status 12/11/2016 FINAL  Final  Surgical pcr screen     Status: None   Collection Time: 12/09/16  1:44 PM  Result Value Ref Range Status   MRSA, PCR NEGATIVE NEGATIVE Final   Staphylococcus aureus NEGATIVE NEGATIVE Final    Comment:        The Xpert SA Assay (FDA approved for NASAL specimens in patients over 73 years of age), is one component of Shalondra Wunschel comprehensive surveillance program.  Test performance has been validated by [facility] for patients greater than or equal to 58 year old. It is not intended to diagnose infection nor to guide or monitor treatment.  Aerobic/Anaerobic Culture (surgical/deep wound)     Status: None (Preliminary result)   Collection Time: 12/09/16  6:18 PM  Result Value Ref Range Status   Specimen Description WOUND   Final   Special Requests SPINE SURGICAL WOUND PT ON ZENSYN  Final   Gram Stain   Final    RARE WBC PRESENT, PREDOMINANTLY MONONUCLEAR NO ORGANISMS SEEN    Culture   Final    NO GROWTH 3 DAYS NO ANAEROBES ISOLATED; CULTURE IN PROGRESS FOR 5 DAYS   Report Status PENDING  Incomplete         Radiology Studies: No results found.      Scheduled Meds: . docusate sodium  100 mg Oral BID  . feeding supplement (ENSURE ENLIVE)  237 mL Oral TID BM  . lisinopril  20 mg Oral QAC breakfast  . potassium chloride  40 mEq Oral BID  . sodium chloride flush  3 mL Intravenous Q12H  . sodium chloride flush  3 mL Intravenous Q12H   Continuous Infusions: . sodium chloride 250 mL (12/11/16 0902)  . dextrose 5 % and 0.9% NaCl 100 mL/hr at 12/09/16 0809  . lactated ringers 10 mL/hr at 12/09/16 1427  . methocarbamol (ROBAXIN)  IV 500 mg (12/13/16 0558)     LOS: 7 days    Time spent: 35 minutes    Georgianne Gritz Melven Sartorius, MD [facility] Pager [phone]  If 7PM-7AM, please contact night-coverage [email or web address] Password [email or web address] 12/13/2016, 11:22 AM

## 2016-12-13 NOTE — Progress Notes (Signed)
Occupational Therapy Treatment Patient Details Name: Derek [name] MRN: [id number] DOB: Jan 08, 1946 Today's Date: 12/13/2016    History of present illness 71 y.o. male admitted on 12/06/16 for AMS.  Suspected spinal abcess, so transferred from [facility] to [facility] for further assessment.  Pt dx with L3-4 chance fx and underwent exploration and lumbar fusion; open internal fixation of L3 and L4 Chance fracture's; open L3 and L4 kyphoplasty; posterior segmental instrumentation from L2-L5; redo posterior lateral arthrodesis L2-3, L3-4, L4-5 and L5-S.  Pt with significant PMH of recent lumbar fusion up to T10 (09/2016), PVD, HTN, COPD, Bell's palsey, R THA, R RTC repair, and multiple back surgeries.     OT comments  Pt making good progress towards goals, continues to require ModA +2 for bed mobility and for sit<>stand transfers at [facility]. Pt completes functional mobility at RW level with MinA and close chair follow for safety. Completed standing grooming ADLs with Min steady assist. Pt demonstrates good motivation to work through pain and progress with therapy. Requires increased time throughout for task completion. Will continue to follow acutely and feel that Pt will benefit from CIR prior to return home to maximize Pt's safety and independence with ADLs and functional mobility.    Follow Up Recommendations  CIR;Supervision/Assistance - 24 hour    Equipment Recommendations  Other (comment) (defer to next venue )    Recommendations for Other Services Rehab consult    Precautions / Restrictions Precautions Precautions: Back Precaution Comments: reviewed 3/3 back precautions with Pt and wife throughout session Required Braces or Orthoses: Spinal Brace Spinal Brace: Thoracolumbosacral orthotic;Applied in supine position Restrictions Weight Bearing Restrictions: No       Mobility Bed Mobility Overal bed mobility: Needs Assistance Bed Mobility: Rolling;Sidelying to Sit Rolling: Max assist Sidelying to  sit: Mod assist;+2 for physical assistance;+2 for safety/equipment       General bed mobility comments: Assist to roll side to side for donning brace, assist to bring trunk into upright position and initial steadying assist; requires verbal sequencing cues to perform log roll technique   Transfers Overall transfer level: Needs assistance Equipment used: Rolling walker (2 wheeled) Transfers: Sit to/from Stand   Stand pivot transfers: +2 physical assistance;Mod assist;+2 safety/equipment       General transfer comment: Mod assist for power up and to support trunk during transitions, verbal cues for safe hand placement.    Balance Overall balance assessment: Needs assistance Sitting-balance support: Feet supported;Bilateral upper extremity supported Sitting balance-Leahy Scale: Fair Sitting balance - Comments: able to maintain static sitting EOB after initial assist provided to steady upon sitting up    Standing balance support: Bilateral upper extremity supported;Single extremity supported;During functional activity Standing balance-Leahy Scale: Poor Standing balance comment:  reliance on RW and min assist from therapist in standing during mobility and while performing grooming ADLs at sink.                           ADL either performed or assessed with clinical judgement   ADL Overall ADL's : Needs assistance/impaired     Grooming: Wash/dry face;Brushing hair;Minimal assistance;Standing           Upper Body Dressing : Total assistance;Bed level Upper Body Dressing Details (indicate cue type and reason): +2 assist to don brace by rolling in the bed                 Functional mobility during ADLs: Moderate assistance;+2 for physical assistance;+2 for  safety/equipment;Rolling walker General ADL Comments: Pt completed bed mobility, standing grooming ADLs, hallway functional mobility during session; Pt with increased pain but motivated to work through it                         Cognition Arousal/Alertness: Awake/alert Behavior During Therapy: Flat affect Overall Cognitive Status: Impaired/Different from baseline                               Problem Solving: Slow processing;Requires verbal cues;Requires tactile cues General Comments: requires increased time and multimodal cues throughout session during task completion                    General Comments Pt's wife present during session     Pertinent Vitals/ Pain       Pain Assessment: 0-10 Pain Score: 5  Pain Location: lower back at incision site  Pain Descriptors / Indicators: Aching;Grimacing;Discomfort Pain Intervention(s): Monitored during session;Repositioned                                                          Frequency  Min 3X/week        Progress Toward Goals  OT Goals(current goals can now be found in the care plan section)  Progress towards OT goals: Progressing toward goals  Acute Rehab OT Goals Patient Stated Goal: to go home, decrease pain OT Goal Formulation: With patient/family Time For Goal Achievement: 12/25/16 Potential to Achieve Goals: Good  Plan Discharge plan remains appropriate    Co-evaluation    PT/OT/SLP Co-Evaluation/Treatment: Yes Reason for Co-Treatment: For patient/therapist safety;To address functional/ADL transfers PT goals addressed during session: Mobility/safety with mobility OT goals addressed during session: ADL's and self-care      AM-PAC PT "6 Clicks" Daily Activity     Outcome Measure   Help from another person eating meals?: None Help from another person taking care of personal grooming?: A Little Help from another person toileting, which includes using toliet, bedpan, or urinal?: A Lot Help from another person bathing (including washing, rinsing, drying)?: A Lot Help from another person to put on and taking off regular upper body clothing?: A Lot Help from  another person to put on and taking off regular lower body clothing?: Total 6 Click Score: 14    End of Session Equipment Utilized During Treatment: Gait belt;Rolling walker;Back brace  OT Visit Diagnosis: Unsteadiness on feet (R26.81);Other abnormalities of gait and mobility (R26.89);Muscle weakness (generalized) (M62.81);Pain Pain - part of body:  (back)   Activity Tolerance Patient tolerated treatment well   Patient Left in chair;with call bell/phone within reach;with chair alarm set;with family/visitor present   Nurse Communication Mobility status        Time: 2355-7322 OT Time Calculation (min): 30 min  Charges: OT General Charges $OT Visit: 1 Procedure OT Treatments $Self Care/Home Management : 8-22 mins  Lou Cal, OT Pager [phone] 12/13/2016    Raymondo Band 12/13/2016, 5:18 PM

## 2016-12-13 NOTE — Progress Notes (Signed)
Pt seen and examined.  No issues overnight.  EXAM: Temp:  [97.7 F (36.5 C)-98.3 F (36.8 C)] 98.3 F (36.8 C) (08/26 0555) Pulse Rate:  [86-94] 87 (08/26 0831) Resp:  [20] 20 (08/26 0831) BP: (118-143)/(44-74) 141/74 (08/26 0831) SpO2:  [95 %-98 %] 95 % (08/26 0831) Intake/Output      08/25 0701 - 08/26 0700 08/26 0701 - 08/27 0700   P.O. 720    I.V. (mL/kg) 74.7 (0.8)    IV Piggyback 440    Total Intake(mL/kg) 1234.7 (12.5)    Urine (mL/kg/hr) 350 (0.1)    Total Output 350     Net +884.7          Urine Occurrence 3 x    Stool Occurrence 2 x     Awake and alert Follows commands Wound c/d/i  Plan Continue with ambulation and pain control No new NS recs

## 2016-12-14 ENCOUNTER — Inpatient Hospital Stay (HOSPITAL_COMMUNITY)
Admission: RE | Admit: 2016-12-14 | Discharge: 2016-12-21 | DRG: 560 | Disposition: A | Discharge status: Home Health | Payer: PPO | Source: Intra-hospital | Attending: Physical Medicine & Rehabilitation | Admitting: Physical Medicine & Rehabilitation

## 2016-12-14 DIAGNOSIS — Z6828 Body mass index (BMI) 28.0-28.9, adult: Secondary | ICD-10-CM

## 2016-12-14 DIAGNOSIS — Z981 Arthrodesis status: Secondary | ICD-10-CM | POA: Diagnosis not present

## 2016-12-14 DIAGNOSIS — F419 Anxiety disorder, unspecified: Secondary | ICD-10-CM

## 2016-12-14 DIAGNOSIS — Z8249 Family history of ischemic heart disease and other diseases of the circulatory system: Secondary | ICD-10-CM

## 2016-12-14 DIAGNOSIS — S32048D Other fracture of fourth lumbar vertebra, subsequent encounter for fracture with routine healing: Secondary | ICD-10-CM | POA: Diagnosis not present

## 2016-12-14 DIAGNOSIS — Z720 Tobacco use: Secondary | ICD-10-CM | POA: Diagnosis not present

## 2016-12-14 DIAGNOSIS — R4182 Altered mental status, unspecified: Secondary | ICD-10-CM

## 2016-12-14 DIAGNOSIS — D62 Acute posthemorrhagic anemia: Secondary | ICD-10-CM | POA: Diagnosis present

## 2016-12-14 DIAGNOSIS — D72828 Other elevated white blood cell count: Secondary | ICD-10-CM | POA: Diagnosis present

## 2016-12-14 DIAGNOSIS — F17213 Nicotine dependence, cigarettes, with withdrawal: Secondary | ICD-10-CM | POA: Diagnosis present

## 2016-12-14 DIAGNOSIS — R5383 Other fatigue: Secondary | ICD-10-CM | POA: Diagnosis not present

## 2016-12-14 DIAGNOSIS — Z96641 Presence of right artificial hip joint: Secondary | ICD-10-CM | POA: Diagnosis not present

## 2016-12-14 DIAGNOSIS — S32040S Wedge compression fracture of fourth lumbar vertebra, sequela: Secondary | ICD-10-CM

## 2016-12-14 DIAGNOSIS — D649 Anemia, unspecified: Secondary | ICD-10-CM

## 2016-12-14 DIAGNOSIS — E440 Moderate protein-calorie malnutrition: Secondary | ICD-10-CM | POA: Diagnosis present

## 2016-12-14 DIAGNOSIS — R21 Rash and other nonspecific skin eruption: Secondary | ICD-10-CM | POA: Diagnosis present

## 2016-12-14 DIAGNOSIS — E876 Hypokalemia: Secondary | ICD-10-CM | POA: Diagnosis present

## 2016-12-14 DIAGNOSIS — E46 Unspecified protein-calorie malnutrition: Secondary | ICD-10-CM

## 2016-12-14 DIAGNOSIS — E538 Deficiency of other specified B group vitamins: Secondary | ICD-10-CM

## 2016-12-14 DIAGNOSIS — Z79899 Other long term (current) drug therapy: Secondary | ICD-10-CM | POA: Diagnosis not present

## 2016-12-14 DIAGNOSIS — E8809 Other disorders of plasma-protein metabolism, not elsewhere classified: Secondary | ICD-10-CM | POA: Diagnosis present

## 2016-12-14 DIAGNOSIS — S32048A Other fracture of fourth lumbar vertebra, initial encounter for closed fracture: Secondary | ICD-10-CM | POA: Diagnosis present

## 2016-12-14 DIAGNOSIS — F17200 Nicotine dependence, unspecified, uncomplicated: Secondary | ICD-10-CM | POA: Diagnosis not present

## 2016-12-14 DIAGNOSIS — G629 Polyneuropathy, unspecified: Secondary | ICD-10-CM | POA: Diagnosis present

## 2016-12-14 DIAGNOSIS — F17203 Nicotine dependence unspecified, with withdrawal: Secondary | ICD-10-CM

## 2016-12-14 DIAGNOSIS — I739 Peripheral vascular disease, unspecified: Secondary | ICD-10-CM | POA: Diagnosis present

## 2016-12-14 DIAGNOSIS — M545 Low back pain: Secondary | ICD-10-CM

## 2016-12-14 DIAGNOSIS — G8918 Other acute postprocedural pain: Secondary | ICD-10-CM

## 2016-12-14 DIAGNOSIS — R4189 Other symptoms and signs involving cognitive functions and awareness: Secondary | ICD-10-CM | POA: Diagnosis not present

## 2016-12-14 DIAGNOSIS — I959 Hypotension, unspecified: Secondary | ICD-10-CM | POA: Diagnosis not present

## 2016-12-14 DIAGNOSIS — S32038D Other fracture of third lumbar vertebra, subsequent encounter for fracture with routine healing: Secondary | ICD-10-CM | POA: Diagnosis not present

## 2016-12-14 DIAGNOSIS — S32009A Unspecified fracture of unspecified lumbar vertebra, initial encounter for closed fracture: Secondary | ICD-10-CM | POA: Diagnosis not present

## 2016-12-14 DIAGNOSIS — I10 Essential (primary) hypertension: Secondary | ICD-10-CM | POA: Diagnosis not present

## 2016-12-14 DIAGNOSIS — J449 Chronic obstructive pulmonary disease, unspecified: Secondary | ICD-10-CM | POA: Diagnosis present

## 2016-12-14 DIAGNOSIS — R5381 Other malaise: Secondary | ICD-10-CM | POA: Diagnosis not present

## 2016-12-14 DIAGNOSIS — Z888 Allergy status to other drugs, medicaments and biological substances status: Secondary | ICD-10-CM | POA: Diagnosis not present

## 2016-12-14 DIAGNOSIS — G8929 Other chronic pain: Secondary | ICD-10-CM

## 2016-12-14 DIAGNOSIS — W19XXXD Unspecified fall, subsequent encounter: Secondary | ICD-10-CM | POA: Diagnosis present

## 2016-12-14 LAB — AEROBIC/ANAEROBIC CULTURE W GRAM STAIN (SURGICAL/DEEP WOUND): Culture: NO GROWTH

## 2016-12-14 LAB — AEROBIC/ANAEROBIC CULTURE (SURGICAL/DEEP WOUND)

## 2016-12-14 MED ORDER — GABAPENTIN 100 MG PO CAPS
200.0000 mg | ORAL_CAPSULE | Freq: Every day | ORAL | Status: DC
  Administered 2016-12-14 – 2016-12-20 (×7): 200 mg via ORAL
  Filled 2016-12-14 (×7): qty 2

## 2016-12-14 MED ORDER — DICLOFENAC SODIUM 1 % TD GEL
2.0000 g | Freq: Four times a day (QID) | TRANSDERMAL | Status: DC
  Administered 2016-12-15 – 2016-12-16 (×4): 2 g via TOPICAL

## 2016-12-14 MED ORDER — BISACODYL 10 MG RE SUPP: 10.0000 mg | Freq: Every day | RECTAL | Status: DC | PRN

## 2016-12-14 MED ORDER — NYSTATIN-TRIAMCINOLONE 100000-0.1 UNIT/GM-% EX CREA
TOPICAL_CREAM | Freq: Two times a day (BID) | CUTANEOUS | Status: DC
  Administered 2016-12-14 – 2016-12-18 (×9): via TOPICAL
  Filled 2016-12-14: qty 15

## 2016-12-14 MED ORDER — FERROUS SULFATE 325 (65 FE) MG PO TABS: 325.0000 mg | ORAL_TABLET | Freq: Two times a day (BID) | ORAL | 0 refills | Status: DC

## 2016-12-14 MED ORDER — ACETAMINOPHEN 325 MG PO TABS
325.0000 mg | ORAL_TABLET | ORAL | Status: DC | PRN
  Administered 2016-12-15 – 2016-12-20 (×8): 650 mg via ORAL
  Filled 2016-12-14 (×8): qty 2

## 2016-12-14 MED ORDER — MENTHOL 3 MG MT LOZG
1.0000 | LOZENGE | OROMUCOSAL | Status: DC | PRN
  Filled 2016-12-14: qty 9

## 2016-12-14 MED ORDER — ALUM & MAG HYDROXIDE-SIMETH 200-200-20 MG/5ML PO SUSP: 30.0000 mL | ORAL | Status: DC | PRN

## 2016-12-14 MED ORDER — FERROUS SULFATE 325 (65 FE) MG PO TABS
325.0000 mg | ORAL_TABLET | Freq: Two times a day (BID) | ORAL | Status: DC
  Administered 2016-12-14: 325 mg via ORAL
  Filled 2016-12-14: qty 1

## 2016-12-14 MED ORDER — ENSURE ENLIVE PO LIQD
237.0000 mL | Freq: Three times a day (TID) | ORAL | Status: DC
  Administered 2016-12-14 – 2016-12-20 (×19): 237 mL via ORAL

## 2016-12-14 MED ORDER — DIPHENHYDRAMINE HCL 12.5 MG/5ML PO ELIX: 12.5000 mg | ORAL_SOLUTION | Freq: Four times a day (QID) | ORAL | Status: DC | PRN

## 2016-12-14 MED ORDER — ONDANSETRON HCL 4 MG/2ML IJ SOLN: 4.0000 mg | Freq: Four times a day (QID) | INTRAMUSCULAR | Status: DC | PRN

## 2016-12-14 MED ORDER — PROCHLORPERAZINE 25 MG RE SUPP: 12.5000 mg | Freq: Four times a day (QID) | RECTAL | Status: DC | PRN

## 2016-12-14 MED ORDER — PROCHLORPERAZINE EDISYLATE 5 MG/ML IJ SOLN: 5.0000 mg | Freq: Four times a day (QID) | INTRAMUSCULAR | Status: DC | PRN

## 2016-12-14 MED ORDER — ONDANSETRON HCL 4 MG PO TABS: 4.0000 mg | ORAL_TABLET | Freq: Four times a day (QID) | ORAL | Status: DC | PRN

## 2016-12-14 MED ORDER — POLYETHYLENE GLYCOL 3350 17 G PO PACK: 17.0000 g | PACK | Freq: Every day | ORAL | Status: DC | PRN

## 2016-12-14 MED ORDER — PROCHLORPERAZINE MALEATE 5 MG PO TABS: 5.0000 mg | ORAL_TABLET | Freq: Four times a day (QID) | ORAL | Status: DC | PRN

## 2016-12-14 MED ORDER — FLEET ENEMA 7-19 GM/118ML RE ENEM: 1.0000 | ENEMA | Freq: Once | RECTAL | Status: DC | PRN

## 2016-12-14 MED ORDER — FERROUS SULFATE 325 (65 FE) MG PO TABS
325.0000 mg | ORAL_TABLET | Freq: Two times a day (BID) | ORAL | Status: DC
  Administered 2016-12-15 – 2016-12-21 (×13): 325 mg via ORAL
  Filled 2016-12-14 (×13): qty 1

## 2016-12-14 MED ORDER — TRAZODONE HCL 50 MG PO TABS: 25.0000 mg | ORAL_TABLET | Freq: Every evening | ORAL | Status: DC | PRN

## 2016-12-14 MED ORDER — LISINOPRIL 20 MG PO TABS
20.0000 mg | ORAL_TABLET | Freq: Every day | ORAL | Status: DC
  Administered 2016-12-15 – 2016-12-17 (×3): 20 mg via ORAL
  Filled 2016-12-14 (×4): qty 1

## 2016-12-14 MED ORDER — SENNA 8.6 MG PO TABS
1.0000 | ORAL_TABLET | Freq: Every day | ORAL | Status: DC
  Administered 2016-12-14 – 2016-12-20 (×8): 8.6 mg via ORAL
  Filled 2016-12-14 (×7): qty 1

## 2016-12-14 MED ORDER — CYANOCOBALAMIN 1000 MCG/ML IJ SOLN
1000.0000 ug | INTRAMUSCULAR | Status: DC
  Administered 2016-12-18: 1000 ug via INTRAMUSCULAR
  Filled 2016-12-14: qty 1

## 2016-12-14 MED ORDER — PHENOL 1.4 % MT LIQD: 1.0000 | OROMUCOSAL | Status: DC | PRN

## 2016-12-14 MED ORDER — OXYCODONE HCL 5 MG PO TABS
5.0000 mg | ORAL_TABLET | ORAL | Status: DC | PRN
  Administered 2016-12-14 – 2016-12-15 (×2): 5 mg via ORAL
  Filled 2016-12-14 (×2): qty 1

## 2016-12-14 MED ORDER — MUSCLE RUB 10-15 % EX CREA: TOPICAL_CREAM | CUTANEOUS | Status: DC | PRN

## 2016-12-14 MED ORDER — METHOCARBAMOL 500 MG PO TABS
500.0000 mg | ORAL_TABLET | Freq: Four times a day (QID) | ORAL | Status: DC | PRN
  Administered 2016-12-14 – 2016-12-17 (×3): 500 mg via ORAL
  Filled 2016-12-14 (×3): qty 1

## 2016-12-14 MED ORDER — OXYCODONE HCL 5 MG PO TABS: 5.0000 mg | ORAL_TABLET | ORAL | Status: DC | PRN

## 2016-12-14 MED ORDER — GUAIFENESIN-DM 100-10 MG/5ML PO SYRP: 5.0000 mL | ORAL_SOLUTION | Freq: Four times a day (QID) | ORAL | Status: DC | PRN

## 2016-12-14 MED ORDER — TRAMADOL HCL 50 MG PO TABS: 50.0000 mg | ORAL_TABLET | Freq: Four times a day (QID) | ORAL | Status: DC | PRN

## 2016-12-14 NOTE — Progress Notes (Signed)
Patient ID: Derek [name], male   DOB: October 28, 1945, 71 y.o.   MRN: [id number] I forgot to mention the the patient's hemoglobin has been drifting down. I will start iron.

## 2016-12-14 NOTE — H&P (Signed)
Physical Medicine and Rehabilitation Admission H&P    Chief Complaint  Patient presents with  . Back pain with decline in mobility and ADLs     HPI:  Derek [name] is an 71 y.o. 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 He was admitted on   12/06/16 with confusion, leucocytosis, recent back pain due to UTI and fall on the way to [facility] ED. He was found to have L3 Chance type fracture with 25% compression fracture of L4 vertebral body.  Blood cultures X 2 done and he was started on IV antibiotics and transferred to [facility] for treatment.  MRI lumbar spine done 8/22 revealing L3 and L4 compression fractures, abnormal contrast and edema right psoas and posterior paraspinal fluid collection at L3-S1 levels. He underwent exploration of lumbar fusion with ORIF L3  And L4 Chance fractures with open Kyphoplasty and PLIF L2-L5 and redo arthrodesis L2-S1 by Dr. Arnoldo Morale on 8/22. 1/2 blood cultures positive for micrococcus and felt to be contaminant. Dr. Megan Salon consulted for input and recommended discontinuation of antibiotics. Leucocytosis resolved and post op ABLA noted.   He continues to have issues with lethargy needing max directional cues to follow commands/tasks. He continues to be  at high risk for falls due to poor safety with impairments in mobility as well as difficulty completing ADL tasks. CIR recommended for follow up therapy.     Review of Systems  HENT: Negative for hearing loss and tinnitus.   Eyes: Negative for blurred vision and double vision.  Respiratory: Negative for cough and shortness of breath.   Cardiovascular: Negative for chest pain and palpitations.  Gastrointestinal: Positive for abdominal pain. Negative for constipation, diarrhea and heartburn.  Genitourinary: Negative for dysuria and urgency.  Musculoskeletal: Positive for  back pain, joint pain (right knee) and myalgias.  Skin: Positive for itching and rash.  Neurological: Positive for tingling, sensory change (chronic issues RLE) and headaches. Negative for focal weakness.  Psychiatric/Behavioral: Positive for memory loss. The patient is nervous/anxious.   All other systems reviewed and are negative.   Past Medical History:  Diagnosis Date  . Adenomatous polyps    remote past, on 5 year surveillance  . Arthritis   . Bell's palsy 40+yrs ago  . Cancer ([place])    skin  . COPD (chronic obstructive pulmonary disease) (HCC)    emphysema, pt. states he is not aware of this  . Diverticulitis   . Dyspnea    pt. denies  . Enlarged prostate   . Flat affect   . Headache(784.0)    having recently  . Hip pain 11/2016  . Hypertension   . Peripheral vascular disease ([place])   . Pneumonia   . Sepsis ([place]) 11/2016    Past Surgical History:  Procedure Laterality Date  . BACK SURGERY  09/2016  . COLONOSCOPY  07/30/2005   Internal hemorrhoids; otherwise, normal rectum/ Left-sided diverticula  . COLONOSCOPY  01/11/2012   Procedure: COLONOSCOPY;  Surgeon: Daneil Dolin, MD;  [facility];  Service: Endoscopy;  Laterality: N/A;  1:15  . ILIAC ARTERY STENT  2006   Left CIA stenting  . KYPHOPLASTY N/A 12/09/2016   Procedure: Vertebroplasty;  Surgeon: Newman Pies, MD;  Location: [place];  Service: Neurosurgery;  Laterality: N/A;  . ROTATOR CUFF REPAIR Right    3 different  surgeries  . SINUS ENDO W/FUSION Left 08/18/2013   Procedure: ENDOSCOPIC SINUS SURGERY WITH FUSION NAVIGATION;  Surgeon: Melissa Montane, MD;  Location: [place];  Service: ENT;  Laterality: Left;  . SINUS ENDO W/FUSION Left 12/21/2013   Procedure: ENDOSCOPIC SINUS SURGERY WITH FUSION NAVIGATION;  Surgeon: Melissa Montane, MD;  Location: [place];  Service: ENT;  Laterality: Left;  ESS with fusion left frontal sinus with Rain Stent  . SPINE SURGERY     lumbar & cervical spine surgeries X 8   . TOTAL HIP  ARTHROPLASTY Right 11/20/2015   Procedure: RIGHT TOTAL HIP ARTHROPLASTY ANTERIOR APPROACH;  Surgeon: Gaynelle Arabian, MD;  Location: [facility];  Service: Orthopedics;  Laterality: Right;  Marland Kitchen VASECTOMY      Family History  Problem Relation Age of Onset  . Heart disease Mother        After age 21  . Heart attack Father   . Heart disease Father        Heart Disease before age 59  . Colon cancer Neg Hx     Social History:  Married. Disabled due to back problems since 2001--used to work for [employer]. He reports that he has been smoking 2 PPD/Cigarettes.  He has a 104.00 pack-year smoking history. He has never used smokeless tobacco. He reports that he does not drink alcohol or use drugs.    Allergies  Allergen Reactions  . Prednisone Other (See Comments)    Wife report it makes him "crazy as a bat".   Medications Prior to Admission  Medication Sig Dispense Refill  . ciprofloxacin (CIPRO) 500 MG tablet Take 1 tablet by mouth 2 (two) times daily.    . diazepam (VALIUM) 10 MG tablet Take 10 mg by mouth 4 (four) times daily. Anxiety    . gabapentin (NEURONTIN) 600 MG tablet Take 600 mg by mouth 3 (three) times daily.    Marland Kitchen lisinopril (PRINIVIL,ZESTRIL) 20 MG tablet Take 20 mg by mouth daily before breakfast.     . docusate sodium (COLACE) 100 MG capsule Take 1 capsule (100 mg total) by mouth 2 (two) times daily. (Patient not taking: Reported on 12/06/2016) 60 capsule 0  . lactulose (CHRONULAC) 10 GM/15ML solution Take 15-30 mLs by mouth at bedtime as needed for moderate constipation. As needed    . oxyCODONE 10 MG TABS Take 1 tablet (10 mg total) by mouth every 4 (four) hours as needed for moderate pain. (Patient not taking: Reported on 12/06/2016) 50 tablet 0    Home: Home Living Family/patient expects to be discharged to:: Private residence Living Arrangements: Spouse/significant other Available Help at Discharge: Family, Available 24 hours/day Type of Home: House Home Access: Level  entry Home Layout: One level Bathroom Shower/Tub: Chiropodist: Handicapped height Bathroom Accessibility: Yes Home Equipment: Environmental consultant - 2 wheels, Wheelchair - manual, Tub bench  Lives With: Spouse   Functional History: Prior Function Level of Independence: Needs assistance Gait / Transfers Assistance Needed: pt had recently gotten to the point where he was unable to walk.  Wife assisting a lot at home.  ADL's / Homemaking Assistance Needed: recently had been increasing in dependence on wife for assist with bathing and dressing - pt self feeds  Functional Status:  Mobility: Bed Mobility Overal bed mobility: Needs Assistance Bed Mobility: Rolling, Sidelying to Sit Rolling: Mod assist Sidelying to sit: Mod assist, +2 for physical assistance General bed mobility comments: pt used bed rail to roll, pt rolled L/R to don brace, pt  dependent Transfers Overall transfer level: Needs assistance Equipment used: Rolling walker (2 wheeled) Transfers: Sit to/from Stand Sit to Stand: Mod assist Stand pivot transfers: +2 physical assistance, Mod assist, +2 safety/equipment General transfer comment: moda to power up and maintain balance during transition of hands from bed to walker Ambulation/Gait Ambulation/Gait assistance: Min assist, +2 safety/equipment Ambulation Distance (Feet): 120 Feet Assistive device: Rolling walker (2 wheeled) Gait Pattern/deviations: Step-through pattern, Shuffle, Trunk flexed General Gait Details: tactile cues at low back and chest to stand up straight, minA for walker management, pt vearing L and R Gait velocity: dec Gait velocity interpretation: Below normal speed for age/gender    ADL: ADL Overall ADL's : Needs assistance/impaired Eating/Feeding: Modified independent, Sitting Eating/Feeding Details (indicate cue type and reason): decreased appetite overall Grooming: Wash/dry face, Brushing hair, Minimal assistance, Standing Grooming  Details (indicate cue type and reason): in recliner Upper Body Bathing: Maximal assistance, With caregiver independent assisting, Bed level Upper Body Bathing Details (indicate cue type and reason): wife has been bathing him, decreased initiation on his part but will if asked by therapist Lower Body Bathing: Maximal assistance, With caregiver independent assisting Upper Body Dressing : Total assistance, Bed level Upper Body Dressing Details (indicate cue type and reason): +2 assist to don brace by rolling in the bed Lower Body Dressing: Total assistance, Bed level, Sit to/from stand Toilet Transfer: Moderate assistance, Ambulation, Comfort height toilet, Grab bars, RW Toileting- Clothing Manipulation and Hygiene: Maximal assistance, Sitting/lateral lean Toileting - Clothing Manipulation Details (indicate cue type and reason): managing urinal sitting EOB, Pt able to use wash cloth to perform peri care aftewards Tub/ Shower Transfer: Moderate assistance, Ambulation, Tub bench, Rolling walker Functional mobility during ADLs: Moderate assistance, +2 for physical assistance, +2 for safety/equipment, Rolling walker General ADL Comments: Pt completed bed mobility, standing grooming ADLs, hallway functional mobility during session; Pt with increased pain but motivated to work through it   Cognition: Cognition Overall Cognitive Status: Impaired/Different from baseline Orientation Level: Oriented to person, Oriented to place Cognition Arousal/Alertness: Lethargic Behavior During Therapy: Flat affect Overall Cognitive Status: Impaired/Different from baseline Area of Impairment: Memory, Problem solving Memory: Decreased short-term memory Problem Solving: Slow processing, Decreased initiation, Difficulty sequencing, Requires verbal cues, Requires tactile cues General Comments: pt requires repeated verbal cues to stay on task  Physical Exam: Blood pressure 132/61, pulse 90, temperature 97.8 F (36.6  C), temperature source Oral, resp. rate 18, height 5' 9"  (1.753 m), weight 98.8 kg (217 lb 13 oz), SpO2 99 %. Physical Exam  Nursing note and vitals reviewed. Constitutional: He appears well-developed and well-nourished. He is sleeping. He appears ill. No distress.  Distracted and restless Motor:  HENT:  Head: Normocephalic and atraumatic.  Mouth/Throat: Oropharynx is clear and moist.  Eyes: Pupils are equal, round, and reactive to light. Conjunctivae and EOM are normal.  Neck: Normal range of motion. Neck supple.  Cardiovascular: Normal rate and regular rhythm.   Respiratory: Effort normal and breath sounds normal. No stridor. No respiratory distress. He has no wheezes.  GI: Soft. Bowel sounds are normal. There is tenderness (non-specific tenderness--question due to bruises.).  Musculoskeletal: He exhibits no edema or tenderness.  Keeps RLE flexed and right knee stiffness noted with ROM. Min edema bilateral knees.   Neurological: He is alert. Coordination abnormal.  Oriented to self and place--unable to recall reason for admission or having had surgery.  Internally distracted and restless.  Motor: 4/5 grossly throughout.  Skin: Skin is warm and dry. He is not  diaphoretic.  Back incision c/d/i  Psychiatric: His mood appears anxious. He expresses impulsivity. He is noncommunicative. He exhibits abnormal recent memory. He is inattentive.    Results for orders placed or performed during the hospital encounter of 12/06/16 (from the past 48 hour(s))  Basic metabolic panel     Status: Abnormal   Collection Time: 12/13/16  6:10 AM  Result Value Ref Range   Sodium 137 135 - 145 mmol/L   Potassium 3.5 3.5 - 5.1 mmol/L   Chloride 100 (L) 101 - 111 mmol/L   CO2 28 22 - 32 mmol/L   Glucose, Bld 107 (H) 65 - 99 mg/dL   BUN 5 (L) 6 - 20 mg/dL   Creatinine, Ser 0.78 0.61 - 1.24 mg/dL   Calcium 8.6 (L) 8.9 - 10.3 mg/dL   GFR calc non Af Amer >60 >60 mL/min   GFR calc Af Amer >60 >60 mL/min     Comment: (NOTE) The eGFR has been calculated using the CKD EPI equation. This calculation has not been validated in all clinical situations. eGFR's persistently <60 mL/min signify possible Chronic Kidney Disease.    Anion gap 9 5 - 15  CBC     Status: Abnormal   Collection Time: 12/13/16  6:10 AM  Result Value Ref Range   WBC 10.3 4.0 - 10.5 K/uL   RBC 2.82 (L) 4.22 - 5.81 MIL/uL   Hemoglobin 8.6 (L) 13.0 - 17.0 g/dL   HCT 26.7 (L) 39.0 - 52.0 %   MCV 94.7 78.0 - 100.0 fL   MCH 30.5 26.0 - 34.0 pg   MCHC 32.2 30.0 - 36.0 g/dL   RDW 14.2 11.5 - 15.5 %   Platelets 421 (H) 150 - 400 K/uL   No results found.     Medical Problem List and Plan: 1.  Cognitive deficits, weakness, poor activity toleratnace secondary to lumbar chance fractures s/p PLIF and kyphoplasty. 2.  DVT Prophylaxis/Anticoagulation: Mechanical: Sequential compression devices, below knee Bilateral lower extremities 3. Pain Management: Limit neurosedating meds--decrease oxycodone to 5 mg prn severe pain.  Resume low dose Neurontin at nights to help with neuropathy and sleep.  4. Mood: LCSW to follow for evaluation and support.  5. Neuropsych: This patient is not fully capable of making decisions on his own behalf. 6. Skin/Wound Care: Monitor I/O. Maintain adequate nutritional and hydration sources. Offer supplements between meals during the day and family encouraged to bring food from home.   7. Fluids/Electrolytes/Nutrition: Monitor I/O. Check lytes in am.  8. HTN: Monitor BP bid- continue lisinopril daily.  9. ABLA: Post surgery. Will monitor for now.  10. Lethargy: will decrease Oxycodone to 5 mg and change robaxin to po prn.   11. Hypokalemia: Resolved with supplement--encourage intake Recheck in am.  12. Vitamin B 12 deficiency:  Vitamin B12 - 104. He was started on B12 injections on 8/24--continue weekly X 4 doses.  13. Neuropathy: Was on Neurontin 600 mg tid at home but had sedating SE per wife. Will resume low  dose at nights.  14. COPD: Currently is 2 PPD smoker but asymptomatic/no issues in the past per wife.    Post Admission Physician Evaluation: 1. Preadmission assessment reviewed and changes made below. 2. Functional deficits secondary  to lumbar chance fractures s/p PLIF and kyphoplasty. 3. Patient is admitted to receive collaborative, interdisciplinary care between the physiatrist, rehab nursing staff, and therapy team. 4. Patient's level of medical complexity and substantial therapy needs in context of that medical necessity cannot  be provided at a lesser intensity of care such as a SNF. 5. Patient has experienced substantial functional loss from his/her baseline which was documented above under the "Functional History" and "Functional Status" headings.  Judging by the patient's diagnosis, physical exam, and functional history, the patient has potential for functional progress which will result in measurable gains while on inpatient rehab.  These gains will be of substantial and practical use upon discharge  in facilitating mobility and self-care at the household level. 48. Physiatrist will provide 24 hour management of medical needs as well as oversight of the therapy plan/treatment and provide guidance as appropriate regarding the interaction of the two. 7. 24 hour rehab nursing will assist with bladder management, bowel management, safety, skin/wound care, disease management, medication administration, pain management and patient education  and help integrate therapy concepts, techniques,education, etc. 8. PT will assess and treat for/with: Lower extremity strength, range of motion, stamina, balance, functional mobility, safety, adaptive techniques and equipment, woundcare, coping skills, pain control, education.   Goals are: Supervision/Mod I. 9. OT will assess and treat for/with: ADL's, functional mobility, safety, upper extremity strength, adaptive techniques and equipment, wound mgt, ego  support, and community reintegration.   Goals are: Supervision/Mod I. Therapy may not proceed with showering this patient. 10. SLP will assess and treat for/with: cognition.  Goals are: Supervision. 11. Case Management and Social Worker will assess and treat for psychological issues and discharge planning. 12. Team conference will be held weekly to assess progress toward goals and to determine barriers to discharge. 13. Patient will receive at least 3 hours of therapy per day at least 5 days per week. 14. ELOS: 10-14 days.       15. Prognosis:  good  Delice Lesch, MD, [address], [place] 12/14/2016

## 2016-12-14 NOTE — Progress Notes (Signed)
PROGRESS NOTE    Derek [name]  AOZ:[id number] DOB: 02/01/1946 DOA: 12/06/2016 PCP: Redmond School, MD (Confirm with patient/family/NH records and if not entered, this HAS to be entered at [facility] point of entry. "No PCP" if truly none.)   Brief Narrative: (Start on day 1 of progress note - keep it brief and live) The patient is Derek [name] 71 y.o.malewith hx of chronic pain syndrome, known to have spinal stenosis, neurogenic claudication, with back surgery about 2 months ago, brought to the ER with increase confusion/ worsening lower back pain. Work up at [employer] new lumbar compression fracture, tachycardic, leukocytosis, neurosurgery contacted and recommended MRI to be done and transferred to [facility] to rule out any infectious process. MRI ordered and pending and Neurosurgery evaluated and feel that patient will likely need surgical revision but awaiting MRI results to make further definitive treatment recommendations.   MRI with findings possibly suggestive of infection.  Pt to OR 8/22, low suspicion for infection based on operative findings.  Wound culture pending pending. 1/2 Bcx resulted positive and ID has been consulted.   Bcx pending.  Zosyn has been d/c'd.  ID following.  B12 injection today for B12 deficiency.   1/2 bcx with coagulase negative staph.  Likely contaminant.  Antibiotics d/c'd.  Derek [name] bit sleepy 8/27 AM, will space out pain meds, d/c robaxin.     Assessment & Plan:   Principal Problem:   Positive blood culture Active Problems:   Pain in limb   Avascular necrosis of bone of right hip (HCC)   Lumbar stenosis with neurogenic claudication   Sepsis (HCC)   Altered mental status   Hyponatremia   Hypokalemia   Lactic acidosis   Normocytic anemia   Anxiety   COPD (chronic obstructive pulmonary disease) (HCC)   Back pain   Compression fracture of lumbar vertebra (HCC)   Thrombocytosis (HCC)   Status post lumbar spinal fusion   B12 deficiency   Worsening lower  back pain/Lumbago with Derek [name] New Compression fractures by the CAT scan at L3 and L4 rule out Infection - MRI lumbar/thoracic spine with compression fx at L3/L4 (noted concern for osteo, but findings less specific with compression fracture).  Also with edema/enhancement within medial R psoas (consider myositis).  Posterior paraspinal fluid collection L3-S1.  T10-S1 posterior spinal fusion without stenosis.   -CT Thoracic Spine showed No acute, traumatic or unexpected finding in the thoracic region. The patient has recently at thoracolumbar fusion. In the thoracic region, this includes pedicle screws and posterior rods at T10, T11 and T12 which have good appearances. No abnormality seen above that. Distant ACDF C6-7 -CT Lumbar Spine showed Extension of the fusion from T10-L3. Surgical discontinuity of the posterior rods at the 3/4 level with old solid fusion from L4 to the sacrum. Chance type fracture at the L3 level extending in an axial plane through the vertebral body and posterior elements. No antero or retrolisthesis. Compression fracture of the L4 vertebral body at the superior aspect with loss of height of about 25%. - Now status post exploration, open internal fixation of L3/L4 Chance Fractures; posterior segmental instrumentation from L2-L5; redo posterior lateral arthrodesis L2-3, L3-4, L4-5 and L5-S1 with local morselized autograft bone, bone morphogenic protein-soaked collagen sponges, and Bone graft extender    - Neurosurgery with low suspicion for infection, based on operative findings [ ]  pending surgical cx NGTD x 4  [ ]  abx discontinued per ID, 1/2 bcx positive for coag neg staph, likely contaminant (micrococcus).   -  Pain Control with Acetaminophen 650 mg po/RC q6hprn mild pain, Muscle Rub Cream, flexeril, robaxin, d/c'd due to sleepiness this AM, and oxycodone decreased to 5 q4 hrs prn -Bowel Regimen with Docusate Sodium 100 mg po BID and Lactulose 10-20 grams po qHSprn for Moderate  Constipation -C/w D5 NS at 100 mL/hr  Acute Encephalopathy with Increased Confusion, improved  -Did not Take More than Required Pain Medication -PCP thought patient had Prostatitis and was started on Ciprofloxacin as an outpatient (negative UA on 8/19)  -CT Head showed No acute intracranial process. Sinus disease as above -Gabapentin and Diazepam Home medications currently held  -Continue to Treat Pain  -UDS Positive for Benzos  -C/w IVF Rehydration as Above  ? Sepsis; ? Lumbar Infection:  Pt with vanc/zosyn initially, which was narrowed to vanc, and finally discontinued.  1/2 bcx with coag negative staph thought contaminant.  Likely not true infection, nsgy thought operative findings not suggestive and cx have been negative. - 1/2 bcx positive for coagulase negative staph, thought contaminant as above  R thigh pain: MSK pain to R thigh, unclear cause, but no swelling, redness, bruising noted.  Will try voltaren, continue to monitor.   Hypertension -Blood pressure elevated -home lisinopril  Hx of COPD/Emphysema -Currently not decompensated -Continue to Monitor Respiratory Status and if necessary add Nebs  Anxiety -Takes Diazepam 10 mg 4 times Daily but currently held -Got 2.5 mg of Valium this AM and 5 mg of Valium last night -May need to restart Valium Slowly -Monitor for Withdrawals   Thrombocytosis   -Continue to Monitor and Repeat CBC in AM  Leukocytosis: - resolved   Hyponatremia - resolved  Hypokalemia -Continue to Monitor and Replete As Necessary -Repeat CMP in AM  Lactic Acidosis  - Resolved  Normocytic Anemia -Unknown Etiology so will obtain Anemia Panel - suggestive of AOCD - vit B12 104, low - Low B12, s/p 1000 mcg vit B12 on 8/24.  Plan for once weekly x 4 weeks then monthly.  F/u outpatient.  -Continue to Monitor for S/Sx of Bleeding and Repeat CBC in AM  DVT prophylaxis: SCD Code Status: full  Family Communication: wife Disposition Plan:  pending surgery   Consultants:   Neurosurgery   Procedures: (Don't include imaging studies which can be auto populated. Include things that cannot be auto populated i.e. Echo, Carotid and venous dopplers, Foley, Bipap, HD, tubes/drains, wound vac, central lines etc) Exploration of lumbar fusion; open internal fixation of L3 and L4 Chance fracture's; open L3 and L4 kyphoplasty; posterior segmental instrumentation from L2-L5; redo posterior lateral arthrodesis L2-3, L3-4, L4-5 and L5-S1 with local morselized autograft bone, bone morphogenic protein-soaked collagen sponges, and Bone graft extender  Antimicrobials: (specify start and planned stop date. Auto populated tables are space occupying and do not give end dates)  vanc   Subjective: Sleepy working with PT.  PT notes he was sleepy.  Denies F/C, CP.  Has BP.    Objective: Vitals:   12/13/16 1820 12/13/16 2145 12/14/16 0143 12/14/16 0604  BP: 119/67 (!) 118/50 130/71 (!) 143/59  Pulse: 85 88 87 83  Resp: 20 20 20    Temp: 98.2 F (36.8 C) 98.8 F (37.1 C) 98.8 F (37.1 C) 99.1 F (37.3 C)  TempSrc: Oral Oral Oral Oral  SpO2: 99% 94% 100% 97%  Weight:      Height:       No intake or output data in the 24 hours ending 12/14/16 1037 Filed Weights   12/06/16 1254 12/09/16  1428 12/09/16 2349  Weight: 99.3 kg (219 lb) 99.3 kg (219 lb) 98.8 kg (217 lb 13 oz)    Examination:  General: No acute distress.  SLeepy Cardiovascular: Heart sounds show [place] regular rate, and rhythm. No gallops or rubs. No murmurs. No JVD. Lungs: Clear to auscultation bilaterally with good air movement. No rales, rhonchi or wheezes. Abdomen: Soft, nontender, nondistended with normal active bowel sounds. No masses. No hepatosplenomegaly. Neurological: Sleepy, walking with PT after my exam  Skin: Warm and dry. No rashes or lesions. Extremities: No clubbing or cyanosis. No edema. Pedal pulses 2+. Psychiatric: Mood and affect are normal. Insight and judgment  are appropriate.  Data Reviewed: I have personally reviewed following labs and imaging studies  CBC:  Recent Labs Lab 12/08/16 0921 12/09/16 0530 12/10/16 0442 12/11/16 0435 12/12/16 1042 12/13/16 0610  WBC 11.0* 9.8 9.6 10.1 12.0* 10.3  NEUTROABS 7.2 5.8  --   --   --   --   HGB 10.4* 10.3* 9.3* 8.9* 8.9* 8.6*  HCT 32.1* 31.9* 29.7* 28.3* 27.5* 26.7*  MCV 94.1 94.9 96.1 93.7 93.9 94.7  PLT 435* 452* 439* 352 411* 578*   Basic Metabolic Panel:  Recent Labs Lab 12/08/16 0921 12/09/16 0530 12/10/16 0442 12/11/16 0435 12/12/16 1042 12/13/16 0610  NA 137 139 140 137 135 137  K 3.5 3.0* 3.2* 3.0* 3.1* 3.5  CL 102 103 105 100* 99* 100*  CO2 27 26 26 28 26 28   GLUCOSE 95 89 81 100* 166* 107*  BUN 7 7 8  5* 6 5*  CREATININE 0.95 0.91 0.93 0.81 0.87 0.78  CALCIUM 8.5* 8.5* 8.2* 8.4* 8.6* 8.6*  MG 1.7 1.9  --   --   --   --   PHOS 3.6 3.8  --   --   --   --    GFR: Estimated Creatinine Clearance: 99.5 mL/min (by C-G formula based on SCr of 0.78 mg/dL). Liver Function Tests:  Recent Labs Lab 12/08/16 0921 12/09/16 0530  AST 38 30  ALT 34 31  ALKPHOS 97 95  BILITOT 0.7 0.6  PROT 5.7* 5.7*  ALBUMIN 2.6* 2.6*   No results for input(s): LIPASE, AMYLASE in the last 168 hours. No results for input(s): AMMONIA in the last 168 hours. Coagulation Profile: No results for input(s): INR, PROTIME in the last 168 hours. Cardiac Enzymes: No results for input(s): CKTOTAL, CKMB, CKMBINDEX, TROPONINI in the last 168 hours. BNP (last 3 results) No results for input(s): PROBNP in the last 8760 hours. HbA1C: No results for input(s): HGBA1C in the last 72 hours. CBG: No results for input(s): GLUCAP in the last 168 hours. Lipid Profile: No results for input(s): CHOL, HDL, LDLCALC, TRIG, CHOLHDL, LDLDIRECT in the last 72 hours. Thyroid Function Tests: No results for input(s): TSH, T4TOTAL, FREET4, T3FREE, THYROIDAB in the last 72 hours. Anemia Panel: No results for input(s):  VITAMINB12, FOLATE, FERRITIN, TIBC, IRON, RETICCTPCT in the last 72 hours. Sepsis Labs:  Recent Labs Lab 12/09/16 0530 12/10/16 0442 12/12/16 0436  PROCALCITON <0.10 <0.10 0.13  LATICACIDVEN 0.7  --   --     Recent Results (from the past 240 hour(s))  Blood Culture (routine x 2)     Status: Abnormal   Collection Time: 12/06/16  1:36 PM  Result Value Ref Range Status   Specimen Description BLOOD RIGHT ANTECUBITAL  Final   Special Requests Blood Culture adequate volume  Final   Culture  Setup Time   Final  GRAM POSITIVE COCCI AEROBIC BOTTLE Gram Stain Report Called to,Read Back By and Verified With: NORMAN,B @0010  BY MATTHEWS,B 8.23.18 [facility]     Culture (Cortana Vanderford)  Final    MICROCOCCUS SPECIES Standardized susceptibility testing for this organism is not available. Performed at [facility], [place] [address], [place], [place] [address]    Report Status 12/12/2016 FINAL  Final  Blood Culture ID Panel (Reflexed)     Status: None   Collection Time: 12/06/16  1:36 PM  Result Value Ref Range Status   Enterococcus species NOT DETECTED NOT DETECTED Final   Listeria monocytogenes NOT DETECTED NOT DETECTED Final   Staphylococcus species NOT DETECTED NOT DETECTED Final   Staphylococcus aureus NOT DETECTED NOT DETECTED Final   Streptococcus species NOT DETECTED NOT DETECTED Final   Streptococcus agalactiae NOT DETECTED NOT DETECTED Final   Streptococcus pneumoniae NOT DETECTED NOT DETECTED Final   Streptococcus pyogenes NOT DETECTED NOT DETECTED Final   Acinetobacter baumannii NOT DETECTED NOT DETECTED Final   Enterobacteriaceae species NOT DETECTED NOT DETECTED Final   Enterobacter cloacae complex NOT DETECTED NOT DETECTED Final   Escherichia coli NOT DETECTED NOT DETECTED Final   Klebsiella oxytoca NOT DETECTED NOT DETECTED Final   Klebsiella pneumoniae NOT DETECTED NOT DETECTED Final   Proteus species NOT DETECTED NOT DETECTED Final   Serratia marcescens NOT DETECTED  NOT DETECTED Final   Haemophilus influenzae NOT DETECTED NOT DETECTED Final   Neisseria meningitidis NOT DETECTED NOT DETECTED Final   Pseudomonas aeruginosa NOT DETECTED NOT DETECTED Final   Candida albicans NOT DETECTED NOT DETECTED Final   Candida glabrata NOT DETECTED NOT DETECTED Final   Candida krusei NOT DETECTED NOT DETECTED Final   Candida parapsilosis NOT DETECTED NOT DETECTED Final   Candida tropicalis NOT DETECTED NOT DETECTED Final    Comment: Performed at [facility], [place] [address], [place], [place] [address]  Blood Culture (routine x 2)     Status: None   Collection Time: 12/06/16  1:43 PM  Result Value Ref Range Status   Specimen Description BLOOD LEFT ANTECUBITAL  Final   Special Requests Blood Culture adequate volume  Final   Culture NO GROWTH 5 DAYS  Final   Report Status 12/11/2016 FINAL  Final  Surgical pcr screen     Status: None   Collection Time: 12/09/16  1:44 PM  Result Value Ref Range Status   MRSA, PCR NEGATIVE NEGATIVE Final   Staphylococcus aureus NEGATIVE NEGATIVE Final    Comment:        The Xpert SA Assay (FDA approved for NASAL specimens in patients over 50 years of age), is one component of Thoma Paulsen comprehensive surveillance program.  Test performance has been validated by [facility] for patients greater than or equal to 79 year old. It is not intended to diagnose infection nor to guide or monitor treatment.   Aerobic/Anaerobic Culture (surgical/deep wound)     Status: None (Preliminary result)   Collection Time: 12/09/16  6:18 PM  Result Value Ref Range Status   Specimen Description WOUND  Final   Special Requests SPINE SURGICAL WOUND PT ON ZENSYN  Final   Gram Stain   Final    RARE WBC PRESENT, PREDOMINANTLY MONONUCLEAR NO ORGANISMS SEEN    Culture   Final    NO GROWTH 4 DAYS NO ANAEROBES ISOLATED; CULTURE IN PROGRESS FOR 5 DAYS   Report Status PENDING  Incomplete         Radiology Studies:  No results  found.      Scheduled Meds: . diclofenac sodium  2 g Topical QID  . docusate sodium  100 mg Oral BID  . feeding supplement (ENSURE ENLIVE)  237 mL Oral TID BM  . lisinopril  20 mg Oral QAC breakfast  . sodium chloride flush  3 mL Intravenous Q12H  . sodium chloride flush  3 mL Intravenous Q12H   Continuous Infusions: . sodium chloride 250 mL (12/11/16 0902)  . dextrose 5 % and 0.9% NaCl 100 mL/hr at 12/09/16 0809  . lactated ringers 10 mL/hr at 12/09/16 1427     LOS: 8 days    Time spent: 35 minutes    Aubryanna Nesheim Melven Sartorius, MD [facility] Pager [phone]  If 7PM-7AM, please contact night-coverage [email or web address] Password [facility] 12/14/2016, 10:37 AM

## 2016-12-14 NOTE — Consult Note (Signed)
   [facility] CM Inpatient Consult   12/14/2016  KARDER GOODIN [birth year] [id number]   Chart reviewed for [facility] disposition and needs in the [employer] plan. Patient admitted for chronic pain syndrom with suspected spinal stenosis and underwent a lumbar fusion .  Chart review that the patient is being admitted to inpatient rehab for services.  Please contact [facility] if patient has community follow up needs from his rehab stay.  For questions, please contact:  Natividad Brood, RN BSN [facility] Liaison  [phone] business mobile phone Toll free office [phone]

## 2016-12-14 NOTE — Progress Notes (Signed)
Physical Therapy Treatment Patient Details Name: Derek [name] MRN: [id number] DOB: [birth year] Today's Date: 12/14/2016    History of Present Illness 71 y.o. male admitted on 12/06/16 for AMS.  Suspected spinal abcess, so transferred from [facility] to [facility] for further assessment.  Pt dx with L3-4 chance fx and underwent exploration and lumbar fusion; open internal fixation of L3 and L4 Chance fracture's; open L3 and L4 kyphoplasty; posterior segmental instrumentation from L2-L5; redo posterior lateral arthrodesis L2-3, L3-4, L4-5 and L5-S.  Pt with significant PMH of recent lumbar fusion up to T10 (09/2016), PVD, HTN, COPD, Bell's palsey, R THA, R RTC repair, and multiple back surgeries.      PT Comments    Pt very lethargic this date requiring max v/c's to maintain eye opening and focus on session. Pt remains to required max directional v/c's to follow commands to complete task and assist for all mobility. Pt with improved ambulation tolerance this date but con't to be at high falls risk. Acute PT to cont to follow.   Follow Up Recommendations  CIR     Equipment Recommendations  None recommended by PT    Recommendations for Other Services Rehab consult     Precautions / Restrictions Precautions Precautions: Back Precaution Booklet Issued: No Precaution Comments: pt unable to recall precautions, pt reeducated Required Braces or Orthoses: Spinal Brace Spinal Brace: Thoracolumbosacral orthotic;Applied in supine position Restrictions Weight Bearing Restrictions: No    Mobility  Bed Mobility Overal bed mobility: Needs Assistance Bed Mobility: Rolling;Sidelying to Sit Rolling: Mod assist Sidelying to sit: Mod assist;+2 for physical assistance       General bed mobility comments: pt used bed rail to roll, pt rolled L/R to don brace, pt dependent  Transfers Overall transfer level: Needs assistance Equipment used: Rolling walker (2 wheeled) Transfers: Sit to/from Stand Sit to Stand:  Mod assist         General transfer comment: moda to power up and maintain balance during transition of hands from bed to walker  Ambulation/Gait Ambulation/Gait assistance: Min assist;+2 safety/equipment Ambulation Distance (Feet): 120 Feet Assistive device: Rolling walker (2 wheeled) Gait Pattern/deviations: Step-through pattern;Shuffle;Trunk flexed Gait velocity: dec Gait velocity interpretation: Below normal speed for age/gender General Gait Details: tactile cues at low back and chest to stand up straight, minA for walker management, pt vearing L and R   Stairs            Wheelchair Mobility    Modified Rankin (Stroke Patients Only)       Balance Overall balance assessment: Needs assistance Sitting-balance support: Feet supported Sitting balance-Leahy Scale: Fair Sitting balance - Comments: able to attempt to don sock but limited by precautions   Standing balance support: Bilateral upper extremity supported;Single extremity supported;During functional activity Standing balance-Leahy Scale: Poor Standing balance comment:  reliance on RW and min assist from therapist in standing during mobility and while performing grooming ADLs at sink.                            Cognition Arousal/Alertness: Lethargic Behavior During Therapy: Flat affect Overall Cognitive Status: Impaired/Different from baseline Area of Impairment: Memory;Problem solving                     Memory: Decreased short-term memory       Problem Solving: Slow processing;Decreased initiation;Difficulty sequencing;Requires verbal cues;Requires tactile cues General Comments: pt requires repeated verbal cues to stay on task  Exercises      General Comments        Pertinent Vitals/Pain Pain Assessment: 0-10 Pain Score: 6  Pain Location: lower back at incision site  Pain Descriptors / Indicators: Aching;Grimacing;Discomfort Pain Intervention(s): Monitored during  session    Home Living                      Prior Function            PT Goals (current goals can now be found in the care plan section) Acute Rehab PT Goals Patient Stated Goal: didn't state Progress towards PT goals: Progressing toward goals    Frequency    Min 5X/week      PT Plan Current plan remains appropriate    Co-evaluation              AM-PAC PT "6 Clicks" Daily Activity  Outcome Measure  Difficulty turning over in bed (including adjusting bedclothes, sheets and blankets)?: Unable Difficulty moving from lying on back to sitting on the side of the bed? : Unable Difficulty sitting down on and standing up from a chair with arms (e.g., wheelchair, bedside commode, etc,.)?: Unable Help needed moving to and from a bed to chair (including a wheelchair)?: A Lot Help needed walking in hospital room?: A Little Help needed climbing 3-5 steps with a railing? : A Lot 6 Click Score: 10    End of Session Equipment Utilized During Treatment: Back brace;Gait belt Activity Tolerance: Patient tolerated treatment well Patient left: in chair;with call bell/phone within reach;with chair alarm set Nurse Communication: Mobility status PT Visit Diagnosis: Muscle weakness (generalized) (M62.81);Difficulty in walking, not elsewhere classified (R26.2);Pain     Time: 3762-8315 PT Time Calculation (min) (ACUTE ONLY): 24 min  Charges:  $Gait Training: 8-22 mins $Therapeutic Activity: 8-22 mins                    G Codes:      Kittie [place], PT, DPT Pager #: [phone] Office #: [phone]    [place] 12/14/2016, 11:40 AM

## 2016-12-14 NOTE — Progress Notes (Signed)
Derek Staggers, MD Physician Signed Physical Medicine and Rehabilitation  Consult Note Date of Service: 12/11/2016 8:14 AM  Related encounter: ED to Hosp-Admission (Current) from 12/06/2016 in [facility] Collapse All   [] Hide copied text [] Hover for attribution information      Physical Medicine and Rehabilitation Consult Reason for Consult: Decreased functional mobility Referring Physician: Dr. Arnoldo Morale   HPI: Derek [name] is a 71 y.o. right handed male with history of spinal stenosis with neurogenic claudication and multiple back surgeries most recently extension of his lumbar fusion up to T10 on 09/23/2016. Per chart review patient lives with wife. Was using a walker prior to admission with limited mobility. Wife can assist. Presented 12/06/2016 with increasing back pain followed outpatient by neurosurgery. He did have a recent fall one week ago. Lumbar CT demonstrated evidence of a Chance fracture at L3 and L4. Underwent exploration of lumbar fusion ORIF L3 and L4 Chance fracture's open L3 and L4 kyphoplasty with posterior segmental instrumentation from L2-L5 and redo posterior lateral arthrodesis 12/09/2016 per Dr. Arnoldo Morale. Hospital course pain management. TLSO back brace applied in the supine position. Bouts of hypokalemia with potassium supplement added. Physical therapy evaluation completed 12/10/2016 with recommendations of physical medicine rehabilitation consult.   Review of Systems  Constitutional: Negative for chills and fever.  HENT: Negative for hearing loss.   Eyes: Negative for blurred vision and double vision.  Respiratory: Negative for cough and shortness of breath.   Cardiovascular: Positive for leg swelling. Negative for chest pain and palpitations.  Gastrointestinal: Positive for constipation. Negative for nausea and vomiting.  Genitourinary: Positive for urgency. Negative for dysuria, flank pain and  hematuria.  Musculoskeletal: Positive for back pain and myalgias.  Skin: Negative for rash.  Neurological: Positive for weakness. Negative for seizures.  All other systems reviewed and are negative.      Past Medical History:  Diagnosis Date  . Adenomatous polyps    remote past, on 5 year surveillance  . Arthritis   . Bell's palsy 40+yrs ago  . Cancer ([place])    skin  . COPD (chronic obstructive pulmonary disease) ([facility])    emphysema, pt. states he is not aware of this  . Diverticulitis   . Dyspnea    pt. denies  . Enlarged prostate   . Flat affect   . Headache(784.0)    having recently  . Hip pain 11/2016  . Hypertension   . Peripheral vascular disease ([place])   . Pneumonia   . Sepsis ([place]) 11/2016        Past Surgical History:  Procedure Laterality Date  . BACK SURGERY  09/2016  . COLONOSCOPY  07/30/2005   Internal hemorrhoids; otherwise, normal rectum/ Left-sided diverticula  . COLONOSCOPY  01/11/2012   Procedure: COLONOSCOPY;  Surgeon: Daneil Dolin, MD;  Location: [facility];  Service: Endoscopy;  Laterality: N/A;  1:15  . ILIAC ARTERY STENT  2006   Left CIA stenting  . ROTATOR CUFF REPAIR Right    3 different surgeries  . SINUS ENDO W/FUSION Left 08/18/2013   Procedure: ENDOSCOPIC SINUS SURGERY WITH FUSION NAVIGATION;  Surgeon: Melissa Montane, MD;  Location: [facility];  Service: ENT;  Laterality: Left;  . SINUS ENDO W/FUSION Left 12/21/2013   Procedure: ENDOSCOPIC SINUS SURGERY WITH FUSION NAVIGATION;  Surgeon: Melissa Montane, MD;  Location: [facility];  Service: ENT;  Laterality: Left;  ESS with fusion left frontal sinus with Rain Stent  .  SPINE SURGERY     lumbar & cervical spine surgeries X 8   . TOTAL HIP ARTHROPLASTY Right 11/20/2015   Procedure: RIGHT TOTAL HIP ARTHROPLASTY ANTERIOR APPROACH;  Surgeon: Gaynelle Arabian, MD;  Location: [facility];  Service: Orthopedics;  Laterality: Right;  Marland Kitchen VASECTOMY          Family History  Problem Relation Age  of Onset  . Heart disease Mother        After age 30  . Heart attack Father   . Heart disease Father        Heart Disease before age 25  . Colon cancer Neg Hx    Social History:  reports that he has been smoking Cigarettes.  He has a 104.00 pack-year smoking history. He has never used smokeless tobacco. He reports that he does not drink alcohol or use drugs. Allergies:       Allergies  Allergen Reactions  . Prednisone Other (See Comments)    Wife report it makes him "crazy as a bat".         Medications Prior to Admission  Medication Sig Dispense Refill  . ciprofloxacin (CIPRO) 500 MG tablet Take 1 tablet by mouth 2 (two) times daily.    . diazepam (VALIUM) 10 MG tablet Take 10 mg by mouth 4 (four) times daily. Anxiety    . gabapentin (NEURONTIN) 600 MG tablet Take 600 mg by mouth 3 (three) times daily.    Marland Kitchen lisinopril (PRINIVIL,ZESTRIL) 20 MG tablet Take 20 mg by mouth daily before breakfast.     . docusate sodium (COLACE) 100 MG capsule Take 1 capsule (100 mg total) by mouth 2 (two) times daily. (Patient not taking: Reported on 12/06/2016) 60 capsule 0  . lactulose (CHRONULAC) 10 GM/15ML solution Take 15-30 mLs by mouth at bedtime as needed for moderate constipation. As needed    . oxyCODONE 10 MG TABS Take 1 tablet (10 mg total) by mouth every 4 (four) hours as needed for moderate pain. (Patient not taking: Reported on 12/06/2016) 50 tablet 0    Home: Home Living Family/patient expects to be discharged to:: Private residence Living Arrangements: Spouse/significant other Available Help at Discharge: Family, Available 24 hours/day Type of Home: House Home Access: Level entry Home Layout: One level Bathroom Shower/Tub: Chiropodist: Handicapped height Home Equipment: Environmental consultant - 2 wheels, Wheelchair - manual, Tub bench  Functional History: Prior Function Level of Independence: Needs assistance Gait / Transfers Assistance Needed: pt had  recently gotten to the point where he was unable to walk.  Wife assisting a lot at home.  Functional Status:  Mobility: Bed Mobility Overal bed mobility: Needs Assistance Bed Mobility: Rolling, Sidelying to Sit Rolling: Mod assist Sidelying to sit: Mod assist General bed mobility comments: Mod assist to help pt turn trun and pelvis to donn brace in bed, pt with verbal cues for log roll technique, wife assisting with care,  Transfers Overall transfer level: Needs assistance Equipment used: Rolling walker (2 wheeled) Transfers: Sit to/from Stand, Stand Pivot Transfers Sit to Stand: +2 physical assistance, Mod assist Stand pivot transfers: +2 physical assistance, Mod assist General transfer comment: Two person mod assist to stand and pivot with RW to the recliner chair.  It took multiple attempts from elevated bed to get to standing wife helping to stabilize the RW, pt found to have stool in the bed and needed to stand two more times to get cleaned up.   Ambulation/Gait General Gait Details: unable at this time.  ADL:  Cognition: Cognition Overall Cognitive Status: Impaired/Different from baseline Orientation Level: Oriented X4 Cognition Arousal/Alertness: Lethargic Behavior During Therapy: Flat affect Overall Cognitive Status: Impaired/Different from baseline Area of Impairment: Problem solving Problem Solving: Difficulty sequencing, Requires verbal cues, Requires tactile cues General Comments: When asked to scoot backwards pt started scooting forwards, despite explaining who I am and what we were going to do pt very confused as I started preparing him to get up OOB.   Blood pressure (!) 131/53, pulse (!) 101, temperature 97.7 F (36.5 C), temperature source Oral, resp. rate 20, height 5\' 9"  (1.753 m), weight 98.8 kg (217 lb 13 oz), SpO2 98 %. Physical Exam  Vitals reviewed. HENT:  Head: Normocephalic.  Eyes: EOM are normal.  Neck: Normal range of motion. Neck supple. No  thyromegaly present.  Cardiovascular: Normal rate, regular rhythm and normal heart sounds.   Respiratory: Effort normal and breath sounds normal. No respiratory distress.  GI: Soft. Bowel sounds are normal. He exhibits no distension.  Neurological:  Lethargic but arousable. He would fall back asleep during exam. He does provide his name and age. Follows simple commands. Moves UE 4/5. Has difficulty with HF due to pain, KE and ADF/PF grossly 2+ to 3/5 but limited due to lethargy too. Senses pain in all 4's.   Skin:  Back incision is dressed.    Lab Results Last 24 Hours       Results for orders placed or performed during the hospital encounter of 12/06/16 (from the past 24 hour(s))  Basic metabolic panel     Status: Abnormal   Collection Time: 12/11/16  4:35 AM  Result Value Ref Range   Sodium 137 135 - 145 mmol/L   Potassium 3.0 (L) 3.5 - 5.1 mmol/L   Chloride 100 (L) 101 - 111 mmol/L   CO2 28 22 - 32 mmol/L   Glucose, Bld 100 (H) 65 - 99 mg/dL   BUN 5 (L) 6 - 20 mg/dL   Creatinine, Ser 0.81 0.61 - 1.24 mg/dL   Calcium 8.4 (L) 8.9 - 10.3 mg/dL   GFR calc non Af Amer >60 >60 mL/min   GFR calc Af Amer >60 >60 mL/min   Anion gap 9 5 - 15  CBC     Status: Abnormal   Collection Time: 12/11/16  4:35 AM  Result Value Ref Range   WBC 10.1 4.0 - 10.5 K/uL   RBC 3.02 (L) 4.22 - 5.81 MIL/uL   Hemoglobin 8.9 (L) 13.0 - 17.0 g/dL   HCT 28.3 (L) 39.0 - 52.0 %   MCV 93.7 78.0 - 100.0 fL   MCH 29.5 26.0 - 34.0 pg   MCHC 31.4 30.0 - 36.0 g/dL   RDW 14.0 11.5 - 15.5 %   Platelets 352 150 - 400 K/uL      Imaging Results (Last 48 hours)  Dg Lumbar Spine 2-3 Views  Result Date: 12/09/2016 CLINICAL DATA:  Status post lumbar fusion and vertebroplasty. EXAM: LUMBAR SPINE - 2-3 VIEW; DG C-ARM 61-120 MIN FLUOROSCOPY TIME:  50 seconds. COMPARISON:  Radiographs of September 23, 2016. FINDINGS: Two intraoperative fluoroscopic images of the lumbar spine demonstrate the patient be  status post posterior intrapedicular screw placement as well as vertebroplasty at 2 levels. Good alignment of vertebral bodies is noted. IMPRESSION: Status post surgical posterior fusion and vertebroplasty of lumbar spine. Electronically Signed   By: Marijo Conception, M.D.   On: 12/09/2016 21:28   Dg C-arm 1-60 Min  Result Date: 12/09/2016  CLINICAL DATA:  Status post lumbar fusion and vertebroplasty. EXAM: LUMBAR SPINE - 2-3 VIEW; DG C-ARM 61-120 MIN FLUOROSCOPY TIME:  50 seconds. COMPARISON:  Radiographs of September 23, 2016. FINDINGS: Two intraoperative fluoroscopic images of the lumbar spine demonstrate the patient be status post posterior intrapedicular screw placement as well as vertebroplasty at 2 levels. Good alignment of vertebral bodies is noted. IMPRESSION: Status post surgical posterior fusion and vertebroplasty of lumbar spine. Electronically Signed   By: Marijo Conception, M.D.   On: 12/09/2016 21:28     Assessment/Plan: Diagnosis: Functional deficits related to L3, L4 chance fractures requiring kyphoplasty and L2-L5 PLIF 1. Does the need for close, 24 hr/day medical supervision in concert with the patient's rehab needs make it unreasonable for this patient to be served in a less intensive setting? Yes 2. Co-Morbidities requiring supervision/potential complications: pain mgt/balancing side effects of meds, COPD, wound care 3. Due to bladder management, bowel management, safety, skin/wound care, disease management, medication administration, pain management and patient education, does the patient require 24 hr/day rehab nursing? Yes 4. Does the patient require coordinated care of a physician, rehab nurse, PT (1-2 hrs/day, 5 days/week) and OT (1-2 hrs/day, 5 days/week) to address physical and functional deficits in the context of the above medical diagnosis(es)? Yes Addressing deficits in the following areas: balance, endurance, locomotion, strength, transferring, bowel/bladder control, bathing,  dressing, feeding, grooming, toileting and psychosocial support 5. Can the patient actively participate in an intensive therapy program of at least 3 hrs of therapy per day at least 5 days per week? Yes and Potentially 6. The potential for patient to make measurable gains while on inpatient rehab is excellent 7. Anticipated functional outcomes upon discharge from inpatient rehab are modified independent  with PT, modified independent, supervision and min assist with OT, n/a with SLP. 8. Estimated rehab length of stay to reach the above functional goals is: 10-14 days 9. Anticipated D/C setting: Home 10. Anticipated post D/C treatments: HH therapy and Outpatient therapy 11. Overall Rehab/Functional Prognosis: excellent  RECOMMENDATIONS: This patient's condition is appropriate for continued rehabilitative care in the following setting: CIR Patient has agreed to participate in recommended program. Yes Note that insurance prior authorization may be required for reimbursement for recommended care.  Comment: Rehab Admissions Coordinator to follow up.  Thanks,  Derek Staggers, MD, Mellody Drown    Cathlyn Parsons., PA-C 12/11/2016    Revision History                        Routing History

## 2016-12-14 NOTE — Progress Notes (Signed)
Patient transferred to 4MW room 03 in bed by NT and this RN.  Patient's wife at bedside. All of patient's belongings transferred with patient and wife.  Receiving RN given update on patient.

## 2016-12-14 NOTE — PMR Pre-admission (Signed)
PMR Admission Coordinator Pre-Admission Assessment  Patient: Derek [name] is an 71 y.o., male MRN: [id number] DOB: 1945-09-17 Height: 5\' 9"  (175.3 cm) Weight: 98.8 kg (217 lb 13 oz)              Insurance Information HMO:     PPO: yes     PCP:      IPA:      80/20:      OTHER: Medicare advantage plan PRIMARY: [employer]      Policy#: [id number]      Subscriber: pt CM Name: karen      Phone#: [phone]     Fax#: EPIC access Pre-Cert#: [id number] approved for 5 days after a peer to peer discussion with insurance MD and Dr. Posey Pronto. Pt has to show tolerance for the intensity of therapy  Call Santiago Glad to remind to f/u at pt on 12/18/16  Employer: retired Benefits:  Phone #: [phone]     Name: 12/11/16 Eff. Date: 04/20/14     Deduct: none      Out of Pocket Max: $3100      Life Max: none CIR: $250 co pay day 1; $ 125 co pay per day days 2-6 then covers 100%     SNF: no co pay days 1-20; $140 co pay per day days 21-100 Outpatient: $10 co pay per visit     Co-Pay: visits per medical neccesity Home Health: $10 co pay per visit      Co-Pay: visits per medical neccesity DME: 80%     Co-Pay: 20% Providers: in network  SECONDARY: GTL      Policy#: [id number]      Subscriber: pt  Medicaid Application Date:       Case Manager:  Disability Application Date:       Case Worker:   Emergency [place]    Name [place] Spouse [phone]     Current Medical History  Patient Admitting Diagnosis: functional deficits related to L3,4 chance fractures requiring Kyphoplasty and L@-L5 PLIF  History of Present Illness:      HPI: Derek [name] an 71 y.o.malewith h/o of COPD, PVD, chronic back pain with neurogenic claudication due to L1 fracture and underwent  L1-L2 fusion with extension to T-10 09/2016. He was admitted on   12/06/16 with confusion, leucocytosis, recent back pain due to UTI and fall on  the way to [facility] ED. He was found to have L3 Chance type fracture with 25% compression fracture of L4 vertebral body.  Blood cultures X 2 done and he was started on IV antibiotics and transferred to [facility] for [facility].  MRI lumbar spine done 8/22  revealing L3 and L4 compression fractures, abnormal contrast and edema right psoas and posterior paraspinal fluid collection at L3-S1 levels  He underwent exploration of lumbar fusion with ORIF L3  And L4 Chance fractures with open Kyphoplasty and PLIF L2-L5 and redo arthrodesis L2-S1 by Dr. Arnoldo Morale on 8/22. 1/2 blood cultures positive for stap aureus and felt to be contaminant. Dr. Megan Salon consulted for input and recommended discontinuation of antibiotics. Leucocytosis resolved and post op ABLA noted.   He continues to have issues with lethargy needing max directional cues to follow commands/tasks. He continues to be  at high risk for falls due to poor safety with impairments in mobility as well as difficulty completing ADL tasks.   Past Medical History  Past Medical History:  Diagnosis Date  .  Adenomatous polyps    remote past, on 5 year surveillance  . Arthritis   . Bell's palsy 40+yrs ago  . Cancer ([place])    skin  . COPD (chronic obstructive pulmonary disease) (HCC)    emphysema, pt. states he is not aware of this  . Diverticulitis   . Dyspnea    pt. denies  . Enlarged prostate   . Flat affect   . Headache(784.0)    having recently  . Hip pain 11/2016  . Hypertension   . Peripheral vascular disease ([place])   . Pneumonia   . Sepsis ([place]) 11/2016    Family History  family history includes Heart attack in his father; Heart disease in his father and mother.  Prior Rehab/Hospitalizations:  Has the patient had major surgery during 100 days prior to admission? Yes  Current Medications   Current Facility-Administered Medications:  .  0.9 %  sodium chloride infusion, 250 mL, Intravenous, Continuous, Newman Pies, MD, Last Rate: 1 mL/hr at  12/11/16 0902, 250 mL at 12/11/16 0902 .  acetaminophen (TYLENOL) tablet 650 mg, 650 mg, Oral, Q4H PRN, 650 mg at 12/12/16 1900 **OR** acetaminophen (TYLENOL) suppository 650 mg, 650 mg, Rectal, Q4H PRN, Newman Pies, MD .  bisacodyl (DULCOLAX) suppository 10 mg, 10 mg, Rectal, Daily PRN, Newman Pies, MD .  cyclobenzaprine (FLEXERIL) tablet 10 mg, 10 mg, Oral, TID PRN, Newman Pies, MD, 10 mg at 12/13/16 0831 .  dextrose 5 %-0.9 % sodium chloride infusion, , Intravenous, Continuous, Orvan Falconer, MD, Last Rate: 100 mL/hr at 12/09/16 0809 .  diclofenac sodium (VOLTAREN) 1 % transdermal gel 2 g, 2 g, Topical, QID, Florene Glen Luvenia Starch., MD, 2 g at 12/14/16 1325 .  docusate sodium (COLACE) capsule 100 mg, 100 mg, Oral, BID, Newman Pies, MD, 100 mg at 12/14/16 0846 .  feeding supplement (ENSURE ENLIVE) (ENSURE ENLIVE) liquid 237 mL, 237 mL, Oral, TID BM, Newman Pies, MD, 237 mL at 12/14/16 1325 .  ferrous sulfate tablet 325 mg, 325 mg, Oral, BID WC, Newman Pies, MD .  lactated ringers infusion, , Intravenous, Continuous, Belinda Block, MD, Last Rate: 10 mL/hr at 12/09/16 1427 .  lactulose (CHRONULAC) 10 GM/15ML solution 10-20 g, 10-20 g, Oral, QHS PRN, Orvan Falconer, MD .  lisinopril (PRINIVIL,ZESTRIL) tablet 20 mg, 20 mg, Oral, QAC breakfast, Ladene Artist., MD, 20 mg at 12/14/16 0845 .  menthol-cetylpyridinium (CEPACOL) lozenge 3 mg, 1 lozenge, Oral, PRN **OR** phenol (CHLORASEPTIC) mouth spray 1 spray, 1 spray, Mouth/Throat, PRN, Newman Pies, MD .  morphine 4 MG/ML injection 4 mg, 4 mg, Intravenous, Q2H PRN, Newman Pies, MD, 4 mg at 12/12/16 1424 .  MUSCLE RUB CREA, , Topical, PRN, Waldron Session, MD, 1 application at 62/13/08 0530 .  ondansetron (ZOFRAN) tablet 4 mg, 4 mg, Oral, Q6H PRN **OR** ondansetron (ZOFRAN) injection 4 mg, 4 mg, Intravenous, Q6H PRN, Newman Pies, MD .  oxyCODONE (Oxy IR/ROXICODONE) immediate release tablet 5 mg, 5 mg, Oral, Q4H PRN,  Ladene Artist., MD .  sodium chloride flush (NS) 0.9 % injection 3 mL, 3 mL, Intravenous, Q12H, Orvan Falconer, MD, 3 mL at 12/13/16 1000 .  sodium chloride flush (NS) 0.9 % injection 3 mL, 3 mL, Intravenous, Q12H, Newman Pies, MD, 3 mL at 12/13/16 2142 .  sodium chloride flush (NS) 0.9 % injection 3 mL, 3 mL, Intravenous, PRN, Newman Pies, MD  Patients Current Diet: Diet Heart Room service appropriate? Yes; Fluid consistency: Thin  Precautions / Restrictions Precautions Precautions:  Back Precaution Booklet Issued: No Precaution Comments: pt unable to recall precautions, pt reeducated Spinal Brace: Thoracolumbosacral orthotic, Applied in supine position Restrictions Weight Bearing Restrictions: No   Has the patient had 2 or more falls or a fall with injury in the past year?No wife denies pt had any falls  Prior Activity Level Wife reports pt was independent without AD and driving until 1 week pta. Then he used a RW and she had to assist with adls. This is pt's 10 th back surgery per wife.  Home Assistive Devices / Equipment Home Assistive Devices/Equipment: Cane (specify quad or straight), Walker (specify type) Home Equipment: Walker - 2 wheels, Wheelchair - manual, Tub bench  Prior Device Use: Indicate devices/aids used by the patient prior to current illness, exacerbation or injury? None of the above  Prior Functional Level Prior Function Level of Independence:  (was independent without AD until 1 week pta per wife) Gait / Transfers Assistance Needed: pt had recently gotten to the point where he was unable to walk.  Wife assisting a lot at home.  ADL's / Homemaking Assistance Needed: recently had been increasing in dependence on wife for assist with bathing and dressing - pt self feeds  Self Care: Did the patient need help bathing, dressing, using the toilet or eating?  Independent  Indoor Mobility: Did the patient need assistance with walking from room to room (with  or without device)? Independent  Stairs: Did the patient need assistance with internal or external stairs (with or without device)? Independent  Functional Cognition: Did the patient need help planning regular tasks such as shopping or remembering to take medications? Independent  Current Functional Level Cognition  Overall Cognitive Status: Impaired/Different from baseline Orientation Level: Oriented to person, Oriented to place General Comments: pt requires repeated verbal cues to stay on task    Extremity Assessment (includes Sensation/Coordination)  Upper Extremity Assessment: Overall WFL for tasks assessed  Lower Extremity Assessment: Defer to PT evaluation RLE Deficits / Details: generalized weakness, difficult to assess due to pain, however, right leg or maybe right side of low back seemed to have increased pain in sitting and standing as pt was weight shifting off of this side.  RLE: Unable to fully assess due to pain LLE Deficits / Details: generally weak, but difficult to assess due to pain.  LLE: Unable to fully assess due to pain    ADLs  Overall ADL's : Needs assistance/impaired Eating/Feeding: Modified independent, Sitting Eating/Feeding Details (indicate cue type and reason): decreased appetite overall Grooming: Wash/dry face, Brushing hair, Minimal assistance, Standing Grooming Details (indicate cue type and reason): in recliner Upper Body Bathing: Maximal assistance, With caregiver independent assisting, Bed level Upper Body Bathing Details (indicate cue type and reason): wife has been bathing him, decreased initiation on his part but will if asked by therapist Lower Body Bathing: Maximal assistance, With caregiver independent assisting Upper Body Dressing : Total assistance, Bed level Upper Body Dressing Details (indicate cue type and reason): +2 assist to don brace by rolling in the bed Lower Body Dressing: Total assistance, Bed level, Sit to/from stand Toilet  Transfer: Moderate assistance, Ambulation, Comfort height toilet, Grab bars, RW Toileting- Clothing Manipulation and Hygiene: Maximal assistance, Sitting/lateral lean Toileting - Clothing Manipulation Details (indicate cue type and reason): managing urinal sitting EOB, Pt able to use wash cloth to perform peri care aftewards Tub/ Shower Transfer: Moderate assistance, Ambulation, Tub bench, Rolling walker Functional mobility during ADLs: Moderate assistance, +2 for physical assistance, +2 for safety/equipment,  Rolling walker General ADL Comments: Pt completed bed mobility, standing grooming ADLs, hallway functional mobility during session; Pt with increased pain but motivated to work through it     Mobility  Overal bed mobility: Needs Assistance Bed Mobility: Rolling, Sidelying to Sit Rolling: Mod assist Sidelying to sit: Mod assist, +2 for physical assistance General bed mobility comments: pt used bed rail to roll, pt rolled L/R to don brace, pt dependent    Transfers  Overall transfer level: Needs assistance Equipment used: Rolling walker (2 wheeled) Transfers: Sit to/from Stand Sit to Stand: Mod assist Stand pivot transfers: +2 physical assistance, Mod assist, +2 safety/equipment General transfer comment: moda to power up and maintain balance during transition of hands from bed to walker    Ambulation / Gait / Stairs / Wheelchair Mobility  Ambulation/Gait Ambulation/Gait assistance: Min assist, +2 safety/equipment Ambulation Distance (Feet): 120 Feet Assistive device: Rolling walker (2 wheeled) Gait Pattern/deviations: Step-through pattern, Shuffle, Trunk flexed General Gait Details: tactile cues at low back and chest to stand up straight, minA for walker management, pt vearing L and R Gait velocity: dec Gait velocity interpretation: Below normal speed for age/gender    Posture / Balance Dynamic Sitting Balance Sitting balance - Comments: able to attempt to don sock but limited by  precautions Balance Overall balance assessment: Needs assistance Sitting-balance support: Feet supported Sitting balance-Leahy Scale: Fair Sitting balance - Comments: able to attempt to don sock but limited by precautions Postural control: Left lateral lean Standing balance support: Bilateral upper extremity supported, Single extremity supported, During functional activity Standing balance-Leahy Scale: Poor Standing balance comment:  reliance on RW and min assist from therapist in standing during mobility and while performing grooming ADLs at sink.    Special needs/care consideration BiPAP/CPAP  N/a CPM  N/a Continuous Drip IV  N/a Dialysis  N/a Life Vest  N/a Oxygen  N/a Special Bed  N/a Trach Size  N/s Wound Vac  N/a Skin surgical incision. Rash to upper back causing pt a lot of itching , blister RLE, rash to left leg Bowel mgmt: continent LB< 8/26 Bladder mgmt: continent Diabetic mgmt   Previous Home Environment Living Arrangements: Spouse/significant other  Lives With: Spouse Available Help at Discharge: Family, Available 24 hours/day Type of Home: House Home Layout: One level Home Access: Level entry Bathroom Shower/Tub: Chiropodist: Handicapped height Bathroom Accessibility: Yes How Accessible: Accessible via walker [place]: No  Discharge Living Setting Plans for Discharge Living Setting: Patient's home, Lives with (comment) (spouse) Type of Home at Discharge: House Discharge Home Layout: One level Discharge Home Access: Level entry Discharge Bathroom Shower/Tub: Tub/shower unit Discharge Bathroom Toilet: Handicapped height Discharge Bathroom Accessibility: Yes How Accessible: Accessible via walker Does the patient have any problems obtaining your medications?: No  Social/Family/Support Systems Patient Roles: Spouse Contact Information: [name], Wife Anticipated Caregiver: wife Anticipated Caregiver's Contact Information: see  above Ability/Limitations of Caregiver: no limitations Caregiver Availability: 24/7 Discharge Plan Discussed with Primary Caregiver: Yes Is Caregiver In Agreement with Plan?: Yes Does Caregiver/Family have Issues with Lodging/Transportation while Pt is in Rehab?: No   Goals/Additional Needs Patient/Family Goal for Rehab: Mod I with PT, Mod I to min assist with OT Expected length of stay: ELOS 10- 14 days Pt/Family Agrees to Admission and willing to participate: Yes Program Orientation Provided & Reviewed with Pt/Caregiver Including Roles  & Responsibilities: Yes   Decrease burden of Care through IP rehab admission: n/a  Possible need for SNF placement upon discharge: not  anticipated  Patient Condition: This patient's medical and functional status has changed since the consult dated: 12/11/2016 in which the Rehabilitation Physician determined and documented that the patient's condition is appropriate for intensive rehabilitative care in an inpatient rehabilitation facility. See "History of Present Illness" (above) for medical update. Functional changes are: min to mod assist. Patient's medical and functional status update has been discussed with the Rehabilitation physician and patient remains appropriate for inpatient rehabilitation. Will admit to inpatient rehab today.  Preadmission Screen Completed By:  Cleatrice Burke, 12/14/2016 3:04 PM ______________________________________________________________________   Discussed status with Dr.  Posey Pronto on 12/14/2016 at  1503 and received telephone approval for admission today.  Admission Coordinator:  Cleatrice Burke, time [id number] Date 12/14/2016

## 2016-12-14 NOTE — Discharge Summary (Signed)
Physician Discharge Summary  Patient ID: Derek [name] MRN: [id number] DOB/AGE: 70/70/1947 70 y.o.  Admit date: 12/06/2016 Discharge date: 12/14/2016  Admission Diagnoses: L3 and L4 fracture, lumbago  Discharge Diagnoses:  The same Principal Problem:   Positive blood culture Active Problems:   Pain in limb   Avascular necrosis of bone of right hip (HCC)   Lumbar stenosis with neurogenic claudication   Sepsis (HCC)   Altered mental status   Hyponatremia   Hypokalemia   Lactic acidosis   Normocytic anemia   Anxiety   COPD (chronic obstructive pulmonary disease) (HCC)   Back pain   Compression fracture of lumbar vertebra (HCC)   Thrombocytosis (HCC)   Status post lumbar spinal fusion   B12 deficiency   Post-operative pain   Benign essential HTN   Acute blood loss anemia   Lethargy   Discharged Condition: fair  Hospital Course:  The patient was admitted on 12/06/2016 with back pain and what turned out to be an L3 and L4 fracture.  He was worked up with a thoracic and lumbar CT and MRI.  I performed a revision of the patient's Thoracolumbar fusion and hardware on 12/09/2016.  The patient's postoperative course was unremarkable except for anemia.  He was started on iron.    The patient was mobilized with PT and OT.  A rehabilitation consult was obtained.  The patient was felt to be a good candidate for inpatient rehab and was transferred to the rehab unit on 12/14/2016.  Consults:  PT, OT, rehab, hospitalist Significant Diagnostic Studies: thoracic and lumbar CT and MRI Treatments: revision of the patient's thoracolumbar instrumentation and fusion on 12/09/2016 Discharge Exam: Blood pressure 129/63, pulse 87, temperature 97.9 F (36.6 C), temperature source Oral, resp. rate 20, height 5\' 9"  (1.753 m), weight 98.8 kg (217 lb 13 oz), SpO2 95 %.  the patient is alert and pleasant.  His wound is healing well without signs of infection.  His strength is grossly normal in his  lower extremities.  Disposition:   Rehab  Discharge Instructions    Call MD for:  difficulty breathing, headache or visual disturbances    Complete by:  As directed    Call MD for:  extreme fatigue    Complete by:  As directed    Call MD for:  hives    Complete by:  As directed    Call MD for:  persistant dizziness or light-headedness    Complete by:  As directed    Call MD for:  persistant nausea and vomiting    Complete by:  As directed    Call MD for:  redness, tenderness, or signs of infection (pain, swelling, redness, odor or green/yellow discharge around incision site)    Complete by:  As directed    Call MD for:  severe uncontrolled pain    Complete by:  As directed    Call MD for:  temperature >100.4    Complete by:  As directed    Diet - low sodium heart healthy    Complete by:  As directed    Discharge instructions    Complete by:  As directed    Call [phone] for a followup appointment. Take a stool softener while you are using pain medications.   Driving Restrictions    Complete by:  As directed    Do not drive for 2 weeks.   Increase activity slowly    Complete by:  As directed    Lifting restrictions  Complete by:  As directed    Do not lift more than 5 pounds. No excessive bending or twisting.   May shower / Bathe    Complete by:  As directed    He may shower after the pain she is removed 3 days after surgery. Leave the incision alone.   Remove dressing in 48 hours    Complete by:  As directed    Your stitches are under the scan and will dissolve by themselves. The Steri-Strips will fall off after you take a few showers. Do not rub back or pick at the wound, Leave the wound alone.     Allergies as of 12/14/2016      Reactions   Prednisone Other (See Comments)   Wife report it makes him "crazy as a bat".      Medication List    STOP taking these medications   ciprofloxacin 500 MG tablet Commonly known as:  CIPRO     TAKE these medications    diazepam 10 MG tablet Commonly known as:  VALIUM Take 10 mg by mouth 4 (four) times daily. Anxiety   docusate sodium 100 MG capsule Commonly known as:  COLACE Take 1 capsule (100 mg total) by mouth 2 (two) times daily.   ferrous sulfate 325 (65 FE) MG tablet Take 1 tablet (325 mg total) by mouth 2 (two) times daily with a meal.   gabapentin 600 MG tablet Commonly known as:  NEURONTIN Take 600 mg by mouth 3 (three) times daily.   lactulose 10 GM/15ML solution Commonly known as:  CHRONULAC Take 15-30 mLs by mouth at bedtime as needed for moderate constipation. As needed   lisinopril 20 MG tablet Commonly known as:  PRINIVIL,ZESTRIL Take 20 mg by mouth daily before breakfast.   Oxycodone HCl 10 MG Tabs Take 1 tablet (10 mg total) by mouth every 4 (four) hours as needed for moderate pain.            Discharge Care Instructions        Start     Ordered   12/15/16 0000  ferrous sulfate 325 (65 FE) MG tablet  2 times daily with meals     12/14/16 1948   12/14/16 0000  Discharge instructions    Comments:  Call [phone] for a followup appointment. Take a stool softener while you are using pain medications.   12/14/16 1948   12/14/16 0000  Increase activity slowly     12/14/16 1948   12/14/16 0000  May shower / Bathe    Comments:  He may shower after the pain she is removed 3 days after surgery. Leave the incision alone.   12/14/16 1948   12/14/16 0000  Driving Restrictions    Comments:  Do not drive for 2 weeks.   12/14/16 1948   12/14/16 0000  Lifting restrictions    Comments:  Do not lift more than 5 pounds. No excessive bending or twisting.   12/14/16 1948   12/14/16 0000  Diet - low sodium heart healthy     12/14/16 1948   12/14/16 0000  Remove dressing in 48 hours    Comments:  Your stitches are under the scan and will dissolve by themselves. The Steri-Strips will fall off after you take a few showers. Do not rub back or pick at the wound, Leave the wound  alone.   12/14/16 1948   12/14/16 0000  Call MD for:  temperature >100.4     12/14/16 1948  12/14/16 0000  Call MD for:  persistant nausea and vomiting     12/14/16 1948   12/14/16 0000  Call MD for:  severe uncontrolled pain     12/14/16 1948   12/14/16 0000  Call MD for:  redness, tenderness, or signs of infection (pain, swelling, redness, odor or green/yellow discharge around incision site)     12/14/16 1948   12/14/16 0000  Call MD for:  difficulty breathing, headache or visual disturbances     12/14/16 1948   12/14/16 0000  Call MD for:  hives     12/14/16 1948   12/14/16 0000  Call MD for:  persistant dizziness or light-headedness     12/14/16 1948   12/14/16 0000  Call MD for:  extreme fatigue     12/14/16 1948       Signed: Newman Pies D 12/14/2016, 7:49 PM

## 2016-12-14 NOTE — Progress Notes (Signed)
I contacted Dr. Arnoldo Morale by phone to request d/c order to inpt rehab . He verifies that he will place order now. 68M is aware and I will notify receiving RN to inpt rehab. [phone]

## 2016-12-14 NOTE — Progress Notes (Signed)
Cristina Gong, RN Rehab Admission Coordinator Signed Physical Medicine and Rehabilitation  PMR Pre-admission Date of Service: 12/14/2016 1:31 PM  Related encounter: ED to Hosp-Admission (Current) from 12/06/2016 in [place]       [] Hide copied text PMR Admission Coordinator Pre-Admission Assessment  Patient: Derek [name] is an 71 y.o., male MRN: [id number] DOB: [birth year] Height: 5\' 9"  (175.3 cm) Weight: 98.8 kg (217 lb 13 oz)                                                                                                                                                  Insurance Information HMO:     PPO: yes     PCP:      IPA:      80/20:      OTHER: Medicare advantage plan PRIMARY: [employer]      Policy#: [id number]      Subscriber: pt CM Name: karen      Phone#: [phone]     Fax#: EPIC access Pre-Cert#: [id number] approved for 5 days after a peer to peer discussion with insurance MD and Dr. Posey Pronto. Pt has to show tolerance for the intensity of therapy  Call Santiago Glad to remind to f/u at pt on 12/18/16  Employer: retired Benefits:  Phone #: [phone]     Name: 12/11/16 Eff. Date: 04/20/14     Deduct: none      Out of Pocket Max: $3100      Life Max: none CIR: $250 co pay day 1; $ 125 co pay per day days 2-6 then covers 100%     SNF: no co pay days 1-20; $140 co pay per day days 21-100 Outpatient: $10 co pay per visit     Co-Pay: visits per medical neccesity Home Health: $10 co pay per visit      Co-Pay: visits per medical neccesity DME: 80%     Co-Pay: 20% Providers: in network  SECONDARY: GTL      Policy#: [id number]      Subscriber: pt  Medicaid Application Date:       Case Manager:  Disability Application Date:       Case Worker:   [employer]    Name [place] Spouse [phone]     Current Medical History  Patient  Admitting Diagnosis: functional deficits related to L3,4 chance fractures requiring Kyphoplasty and L@-L5 PLIF  History of Present Illness:      HPI: Sevag Shearn Paschalis an 71 y.o.malewith h/oof COPD, PVD, 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 He was admitted on 12/06/16 with confusion, leucocytosis, recent back pain due to UTI and fall on  the way to [facility] ED. He was found to have L3 Chance type fracture with 25% compression fracture of L4 vertebral body. Blood cultures X 2 done and he was started on IV antibiotics and transferred to [facility] for treatment. MRI lumbar spine done 8/22 revealing L3 and L4 compression fractures, abnormal contrast and edema right psoas and posterior paraspinal fluid collection at L3-S1 levels  He underwent exploration of lumbar fusion with ORIF L3 And L4 Chance fractures with open Kyphoplasty and PLIF L2-L5 and redo arthrodesis L2-S1 by Dr. Arnoldo Morale on 8/22. 1/2 blood cultures positive for stap aureus and felt to be contaminant. Dr. Megan Salon consulted for input and recommended discontinuation of antibiotics. Leucocytosis resolved and post op ABLA noted. He continues to have issues with lethargy needing max directional cues to follow commands/tasks. He continues to be at high risk for falls due to poor safety with impairments in mobility as well as difficulty completing ADL tasks.   Past Medical History      Past Medical History:  Diagnosis Date  . Adenomatous polyps    remote past, on 5 year surveillance  . Arthritis   . Bell's palsy 40+yrs ago  . Cancer ([place])    skin  . COPD (chronic obstructive pulmonary disease) (HCC)    emphysema, pt. states he is not aware of this  . Diverticulitis   . Dyspnea    pt. denies  . Enlarged prostate   . Flat affect   . Headache(784.0)    having recently  . Hip pain 11/2016  . Hypertension   . Peripheral vascular  disease ([place])   . Pneumonia   . Sepsis ([place]) 11/2016    Family History  family history includes Heart attack in his father; Heart disease in his father and mother.  Prior Rehab/Hospitalizations:  Has the patient had major surgery during 100 days prior to admission? Yes  Current Medications   Current Facility-Administered Medications:  .  0.9 %  sodium chloride infusion, 250 mL, Intravenous, Continuous, Newman Pies, MD, Last Rate: 1 mL/hr at 12/11/16 0902, 250 mL at 12/11/16 0902 .  acetaminophen (TYLENOL) tablet 650 mg, 650 mg, Oral, Q4H PRN, 650 mg at 12/12/16 1900 **OR** acetaminophen (TYLENOL) suppository 650 mg, 650 mg, Rectal, Q4H PRN, Newman Pies, MD .  bisacodyl (DULCOLAX) suppository 10 mg, 10 mg, Rectal, Daily PRN, Newman Pies, MD .  cyclobenzaprine (FLEXERIL) tablet 10 mg, 10 mg, Oral, TID PRN, Newman Pies, MD, 10 mg at 12/13/16 0831 .  dextrose 5 %-0.9 % sodium chloride infusion, , Intravenous, Continuous, Orvan Falconer, MD, Last Rate: 100 mL/hr at 12/09/16 0809 .  diclofenac sodium (VOLTAREN) 1 % transdermal gel 2 g, 2 g, Topical, QID, Florene Glen Luvenia Starch., MD, 2 g at 12/14/16 1325 .  docusate sodium (COLACE) capsule 100 mg, 100 mg, Oral, BID, Newman Pies, MD, 100 mg at 12/14/16 0846 .  feeding supplement (ENSURE ENLIVE) (ENSURE ENLIVE) liquid 237 mL, 237 mL, Oral, TID BM, Newman Pies, MD, 237 mL at 12/14/16 1325 .  ferrous sulfate tablet 325 mg, 325 mg, Oral, BID WC, Newman Pies, MD .  lactated ringers infusion, , Intravenous, Continuous, Belinda Block, MD, Last Rate: 10 mL/hr at 12/09/16 1427 .  lactulose (CHRONULAC) 10 GM/15ML solution 10-20 g, 10-20 g, Oral, QHS PRN, Orvan Falconer, MD .  lisinopril (PRINIVIL,ZESTRIL) tablet 20 mg, 20 mg, Oral, QAC breakfast, Ladene Artist., MD, 20 mg at 12/14/16 0845 .  menthol-cetylpyridinium (CEPACOL) lozenge 3 mg, 1 lozenge, Oral, PRN **OR** phenol (  CHLORASEPTIC) mouth spray 1 spray, 1 spray,  Mouth/Throat, PRN, Newman Pies, MD .  morphine 4 MG/ML injection 4 mg, 4 mg, Intravenous, Q2H PRN, Newman Pies, MD, 4 mg at 12/12/16 1424 .  MUSCLE RUB CREA, , Topical, PRN, Waldron Session, MD, 1 application at 37/62/83 0530 .  ondansetron (ZOFRAN) tablet 4 mg, 4 mg, Oral, Q6H PRN **OR** ondansetron (ZOFRAN) injection 4 mg, 4 mg, Intravenous, Q6H PRN, Newman Pies, MD .  oxyCODONE (Oxy IR/ROXICODONE) immediate release tablet 5 mg, 5 mg, Oral, Q4H PRN, Ladene Artist., MD .  sodium chloride flush (NS) 0.9 % injection 3 mL, 3 mL, Intravenous, Q12H, Orvan Falconer, MD, 3 mL at 12/13/16 1000 .  sodium chloride flush (NS) 0.9 % injection 3 mL, 3 mL, Intravenous, Q12H, Newman Pies, MD, 3 mL at 12/13/16 2142 .  sodium chloride flush (NS) 0.9 % injection 3 mL, 3 mL, Intravenous, PRN, Newman Pies, MD  Patients Current Diet: Diet Heart Room service appropriate? Yes; Fluid consistency: Thin  Precautions / Restrictions Precautions Precautions: Back Precaution Booklet Issued: No Precaution Comments: pt unable to recall precautions, pt reeducated Spinal Brace: Thoracolumbosacral orthotic, Applied in supine position Restrictions Weight Bearing Restrictions: No   Has the patient had 2 or more falls or a fall with injury in the past year?No wife denies pt had any falls  Prior Activity Level Wife reports pt was independent without AD and driving until 1 week pta. Then he used a RW and she had to assist with adls. This is pt's 10 th back surgery per wife.  Home Assistive Devices / Equipment Home Assistive Devices/Equipment: Cane (specify quad or straight), Walker (specify type) Home Equipment: Walker - 2 wheels, Wheelchair - manual, Tub bench  Prior Device Use: Indicate devices/aids used by the patient prior to current illness, exacerbation or injury? None of the above  Prior Functional Level Prior Function Level of Independence:  (was independent without AD until 1 week pta  per wife) Gait / Transfers Assistance Needed: pt had recently gotten to the point where he was unable to walk.  Wife assisting a lot at home.  ADL's / Homemaking Assistance Needed: recently had been increasing in dependence on wife for assist with bathing and dressing - pt self feeds  Self Care: Did the patient need help bathing, dressing, using the toilet or eating?  Independent  Indoor Mobility: Did the patient need assistance with walking from room to room (with or without device)? Independent  Stairs: Did the patient need assistance with internal or external stairs (with or without device)? Independent  Functional Cognition: Did the patient need help planning regular tasks such as shopping or remembering to take medications? Independent  Current Functional Level Cognition  Overall Cognitive Status: Impaired/Different from baseline Orientation Level: Oriented to person, Oriented to place General Comments: pt requires repeated verbal cues to stay on task    Extremity Assessment (includes Sensation/Coordination)  Upper Extremity Assessment: Overall WFL for tasks assessed  Lower Extremity Assessment: Defer to PT evaluation RLE Deficits / Details: generalized weakness, difficult to assess due to pain, however, right leg or maybe right side of low back seemed to have increased pain in sitting and standing as pt was weight shifting off of this side.  RLE: Unable to fully assess due to pain LLE Deficits / Details: generally weak, but difficult to assess due to pain.  LLE: Unable to fully assess due to pain    ADLs  Overall ADL's : Needs assistance/impaired Eating/Feeding: Modified independent,  Sitting Eating/Feeding Details (indicate cue type and reason): decreased appetite overall Grooming: Wash/dry face, Brushing hair, Minimal assistance, Standing Grooming Details (indicate cue type and reason): in recliner Upper Body Bathing: Maximal assistance, With caregiver independent  assisting, Bed level Upper Body Bathing Details (indicate cue type and reason): wife has been bathing him, decreased initiation on his part but will if asked by therapist Lower Body Bathing: Maximal assistance, With caregiver independent assisting Upper Body Dressing : Total assistance, Bed level Upper Body Dressing Details (indicate cue type and reason): +2 assist to don brace by rolling in the bed Lower Body Dressing: Total assistance, Bed level, Sit to/from stand Toilet Transfer: Moderate assistance, Ambulation, Comfort height toilet, Grab bars, RW Toileting- Clothing Manipulation and Hygiene: Maximal assistance, Sitting/lateral lean Toileting - Clothing Manipulation Details (indicate cue type and reason): managing urinal sitting EOB, Pt able to use wash cloth to perform peri care aftewards Tub/ Shower Transfer: Moderate assistance, Ambulation, Tub bench, Rolling walker Functional mobility during ADLs: Moderate assistance, +2 for physical assistance, +2 for safety/equipment, Rolling walker General ADL Comments: Pt completed bed mobility, standing grooming ADLs, hallway functional mobility during session; Pt with increased pain but motivated to work through it     Mobility  Overal bed mobility: Needs Assistance Bed Mobility: Rolling, Sidelying to Sit Rolling: Mod assist Sidelying to sit: Mod assist, +2 for physical assistance General bed mobility comments: pt used bed rail to roll, pt rolled L/R to don brace, pt dependent    Transfers  Overall transfer level: Needs assistance Equipment used: Rolling walker (2 wheeled) Transfers: Sit to/from Stand Sit to Stand: Mod assist Stand pivot transfers: +2 physical assistance, Mod assist, +2 safety/equipment General transfer comment: moda to power up and maintain balance during transition of hands from bed to walker    Ambulation / Gait / Stairs / Wheelchair Mobility  Ambulation/Gait Ambulation/Gait assistance: Min assist, +2  safety/equipment Ambulation Distance (Feet): 120 Feet Assistive device: Rolling walker (2 wheeled) Gait Pattern/deviations: Step-through pattern, Shuffle, Trunk flexed General Gait Details: tactile cues at low back and chest to stand up straight, minA for walker management, pt vearing L and R Gait velocity: dec Gait velocity interpretation: Below normal speed for age/gender    Posture / Balance Dynamic Sitting Balance Sitting balance - Comments: able to attempt to don sock but limited by precautions Balance Overall balance assessment: Needs assistance Sitting-balance support: Feet supported Sitting balance-Leahy Scale: Fair Sitting balance - Comments: able to attempt to don sock but limited by precautions Postural control: Left lateral lean Standing balance support: Bilateral upper extremity supported, Single extremity supported, During functional activity Standing balance-Leahy Scale: Poor Standing balance comment:  reliance on RW and min assist from therapist in standing during mobility and while performing grooming ADLs at sink.    Special needs/care consideration BiPAP/CPAP  N/a CPM  N/a Continuous Drip IV  N/a Dialysis  N/a Life Vest  N/a Oxygen  N/a Special Bed  N/a Trach Size  N/s Wound Vac  N/a Skin surgical incision. Rash to upper back causing pt a lot of itching , blister RLE, rash to left leg Bowel mgmt: continent LB< 8/26 Bladder mgmt: continent Diabetic mgmt   Previous Home Environment Living Arrangements: Spouse/significant other  Lives With: Spouse Available Help at Discharge: Family, Available 24 hours/day Type of Home: House Home Layout: One level Home Access: Level entry Bathroom Shower/Tub: Chiropodist: Handicapped height Bathroom Accessibility: Yes How Accessible: Accessible via walker [place]: No  Discharge Living Setting  Plans for Discharge Living Setting: Patient's home, Lives with (comment) (spouse) Type of  Home at Discharge: House Discharge Home Layout: One level Discharge Home Access: Level entry Discharge Bathroom Shower/Tub: Tub/shower unit Discharge Bathroom Toilet: Handicapped height Discharge Bathroom Accessibility: Yes How Accessible: Accessible via walker Does the patient have any problems obtaining your medications?: No  Social/Family/Support Systems Patient Roles: Spouse Contact Information: [name], Wife Anticipated Caregiver: wife Anticipated Caregiver's Contact Information: see above Ability/Limitations of Caregiver: no limitations Caregiver Availability: 24/7 Discharge Plan Discussed with Primary Caregiver: Yes Is Caregiver In Agreement with Plan?: Yes Does Caregiver/Family have Issues with Lodging/Transportation while Pt is in Rehab?: No   Goals/Additional Needs Patient/Family Goal for Rehab: Mod I with PT, Mod I to min assist with OT Expected length of stay: ELOS 10- 14 days Pt/Family Agrees to Admission and willing to participate: Yes Program Orientation Provided & Reviewed with Pt/Caregiver Including Roles  & Responsibilities: Yes   Decrease burden of Care through IP rehab admission: n/a  Possible need for SNF placement upon discharge: not anticipated  Patient Condition: This patient's medical and functional status has changed since the consult dated: 12/11/2016 in which the Rehabilitation Physician determined and documented that the patient's condition is appropriate for intensive rehabilitative care in an inpatient rehabilitation facility. See "History of Present Illness" (above) for medical update. Functional changes are: min to mod assist. Patient's medical and functional status update has been discussed with the Rehabilitation physician and patient remains appropriate for inpatient rehabilitation. Will admit to inpatient rehab today.  Preadmission Screen Completed By:  Cleatrice Burke, 12/14/2016 3:04  PM ______________________________________________________________________   Discussed status with Dr.  Posey Pronto on 12/14/2016 at  1503 and received telephone approval for admission today.  Admission Coordinator:  Cleatrice Burke, time 4536 Date 12/14/2016       Cosigned by: Jamse Arn, MD at 12/14/2016 3:12 PM  Revision History

## 2016-12-14 NOTE — Progress Notes (Signed)
I have insurance approval to admit pt to inpt rehab today. Pt is in agreement. I spoke with his wife by phone and she is also in agreement. I contacted Dr. Arnoldo Morale by phone and he will d/c to rehab today. I will make the arrangements. [phone]

## 2016-12-14 NOTE — Progress Notes (Signed)
Patient ID: Derek [name], male   DOB: 19-Aug-1945, 71 y.o.   MRN: [id number] Subjective:  The patient is alert and pleasant. He looks and feels better than when I last saw him. His wife is at the bedside. I'm told he pulled his bandage off.  Objective: Vital signs in last 24 hours: Temp:  [98.2 F (36.8 C)-99.1 F (37.3 C)] 99.1 F (37.3 C) (08/27 0604) Pulse Rate:  [83-97] 83 (08/27 0604) Resp:  [20] 20 (08/27 0143) BP: (118-143)/(50-71) 143/59 (08/27 0604) SpO2:  [94 %-100 %] 97 % (08/27 0604)  Intake/Output from previous day: No intake/output data recorded. Intake/Output this shift: No intake/output data recorded.  Physical exam the patient is alert and pleasant. He is moving his lower extremities well. His wound is healing well.  Lab Results:  Recent Labs  12/12/16 1042 12/13/16 0610  WBC 12.0* 10.3  HGB 8.9* 8.6*  HCT 27.5* 26.7*  PLT 411* 421*   BMET  Recent Labs  12/12/16 1042 12/13/16 0610  NA 135 137  K 3.1* 3.5  CL 99* 100*  CO2 26 28  GLUCOSE 166* 107*  BUN 6 5*  CREATININE 0.87 0.78  CALCIUM 8.6* 8.6*    Studies/Results: No results found.  Assessment/Plan: Postop day #5: The patient is slowly making progress. It looks like he'll need rehabilitation.  LOS: 8 days     Lilja Soland D 12/14/2016, 8:52 AM

## 2016-12-14 NOTE — Care Management Note (Signed)
Case Management Note  Patient Details  Name: Derek [name] MRN: [id number] Date of Birth: December 09, 1945  Subjective/Objective:                    Action/Plan: Pt discharging to CIR today. No further needs per CM.   Expected Discharge Date:                  Expected Discharge Plan:  [place]  In-House Referral:     Discharge planning Services  CM Consult  Post Acute Care Choice:    Choice offered to:     DME Arranged:    DME Agency:     HH Arranged:    [place] Agency:     Status of Service:  Completed, signed off  If discussed at [employer] of Stay Meetings, dates discussed:    Additional Comments:  Pollie Friar, RN 12/14/2016, 1:19 PM

## 2016-12-15 ENCOUNTER — Encounter (HOSPITAL_COMMUNITY): Payer: Self-pay

## 2016-12-15 ENCOUNTER — Inpatient Hospital Stay (HOSPITAL_COMMUNITY): Payer: PPO | Admitting: Physical Therapy

## 2016-12-15 ENCOUNTER — Inpatient Hospital Stay (HOSPITAL_COMMUNITY): Payer: PPO

## 2016-12-15 ENCOUNTER — Inpatient Hospital Stay (HOSPITAL_COMMUNITY): Payer: PPO | Admitting: Occupational Therapy

## 2016-12-15 DIAGNOSIS — E46 Unspecified protein-calorie malnutrition: Secondary | ICD-10-CM

## 2016-12-15 DIAGNOSIS — S32048A Other fracture of fourth lumbar vertebra, initial encounter for closed fracture: Secondary | ICD-10-CM

## 2016-12-15 DIAGNOSIS — R5383 Other fatigue: Secondary | ICD-10-CM | POA: Insufficient documentation

## 2016-12-15 DIAGNOSIS — E876 Hypokalemia: Secondary | ICD-10-CM

## 2016-12-15 DIAGNOSIS — D62 Acute posthemorrhagic anemia: Secondary | ICD-10-CM

## 2016-12-15 DIAGNOSIS — I10 Essential (primary) hypertension: Secondary | ICD-10-CM

## 2016-12-15 DIAGNOSIS — E538 Deficiency of other specified B group vitamins: Secondary | ICD-10-CM

## 2016-12-15 DIAGNOSIS — J449 Chronic obstructive pulmonary disease, unspecified: Secondary | ICD-10-CM

## 2016-12-15 LAB — COMPREHENSIVE METABOLIC PANEL
ALBUMIN: 2.5 g/dL — AB (ref 3.5–5.0)
ALT: 78 U/L — ABNORMAL HIGH (ref 17–63)
ANION GAP: 9 (ref 5–15)
AST: 61 U/L — ABNORMAL HIGH (ref 15–41)
Alkaline Phosphatase: 99 U/L (ref 38–126)
BILIRUBIN TOTAL: 0.5 mg/dL (ref 0.3–1.2)
BUN: 8 mg/dL (ref 6–20)
CHLORIDE: 97 mmol/L — AB (ref 101–111)
CO2: 30 mmol/L (ref 22–32)
Calcium: 8.5 mg/dL — ABNORMAL LOW (ref 8.9–10.3)
Creatinine, Ser: 0.86 mg/dL (ref 0.61–1.24)
GFR calc Af Amer: >60 mL/min (ref >60)
GFR calc non Af Amer: >60 mL/min (ref >60)
GLUCOSE: 98 mg/dL (ref 65–99)
POTASSIUM: 4.1 mmol/L (ref 3.5–5.1)
SODIUM: 136 mmol/L (ref 135–145)
TOTAL PROTEIN: 5.9 g/dL — AB (ref 6.5–8.1)

## 2016-12-15 LAB — CBC WITH DIFFERENTIAL/PLATELET
BASOS PCT: 0 %
Basophils Absolute: 0 10*3/uL (ref 0.0–0.1)
EOS ABS: 0.3 10*3/uL (ref 0.0–0.7)
EOS PCT: 3 %
HEMATOCRIT: 28.9 % — AB (ref 39.0–52.0)
Hemoglobin: 9 g/dL — ABNORMAL LOW (ref 13.0–17.0)
Lymphocytes Relative: 31 %
Lymphs Abs: 3 10*3/uL (ref 0.7–4.0)
MCH: 29.6 pg (ref 26.0–34.0)
MCHC: 31.1 g/dL (ref 30.0–36.0)
MCV: 95.1 fL (ref 78.0–100.0)
MONO ABS: 1.1 10*3/uL — AB (ref 0.1–1.0)
MONOS PCT: 11 %
Neutro Abs: 5.4 10*3/uL (ref 1.7–7.7)
Neutrophils Relative %: 55 %
PLATELETS: 549 10*3/uL — AB (ref 150–400)
RBC: 3.04 MIL/uL — ABNORMAL LOW (ref 4.22–5.81)
RDW: 14.6 % (ref 11.5–15.5)
WBC: 9.8 10*3/uL (ref 4.0–10.5)

## 2016-12-15 MED ORDER — TRAMADOL HCL 50 MG PO TABS
50.0000 mg | ORAL_TABLET | Freq: Three times a day (TID) | ORAL | Status: DC
  Administered 2016-12-15 – 2016-12-21 (×25): 50 mg via ORAL
  Filled 2016-12-15 (×24): qty 1

## 2016-12-15 MED ORDER — HYDROCODONE-ACETAMINOPHEN 7.5-325 MG PO TABS
1.0000 | ORAL_TABLET | Freq: Four times a day (QID) | ORAL | Status: DC | PRN
  Administered 2016-12-16 – 2016-12-17 (×2): 1 via ORAL
  Filled 2016-12-15 (×2): qty 1

## 2016-12-15 NOTE — Progress Notes (Signed)
Patient ID: Derek [name], male   DOB: Apr 29, 1945, 71 y.o.   MRN: [id number] Upon arrival, pt present clean and resting in bed with eyes close. Pt has a dry dressing to surgical site.Once pt was awaken to orient on new environment pt was able to answer all 4 orientation questions correctly.  Pt present with a mild rash on back, MD are aware and PRN medication was applied once received from pharmacy.

## 2016-12-15 NOTE — Plan of Care (Signed)
Problem: RH SKIN INTEGRITY Goal: RH STG SKIN FREE OF INFECTION/BREAKDOWN Outcome: Progressing With Min. A Goal: RH OTHER STG SKIN INTEGRITY GOALS W/ASSIST Other STG Skin Integrity Goals With Assistance.  Outcome: Progressing Min. Assist  Problem: RH SAFETY Goal: RH OTHER STG SAFETY GOALS W/ASSIST Other STG Safety Goals With Assistance.  Outcome: Progressing Min. Assist  Problem: RH PAIN MANAGEMENT Goal: RH STG PAIN MANAGED AT OR BELOW PT'S PAIN GOAL Outcome: Progressing Less than 3

## 2016-12-15 NOTE — Care Management Note (Signed)
Inpatient [facility] Individual Statement of Services  Patient Name:  Derek [name]  Date:  12/15/2016  Welcome to the [facility].  Our goal is to provide you with an individualized program based on your diagnosis and situation, designed to meet your specific needs.  With this comprehensive rehabilitation program, you will be expected to participate in at least 3 hours of rehabilitation therapies Monday-Friday, with modified therapy programming on the weekends.  Your rehabilitation program will include the following services:  Physical Therapy (PT), Occupational Therapy (OT), 24 hour per day rehabilitation nursing, Case Management (Social Worker), Rehabilitation Medicine, Nutrition Services and Pharmacy Services  Weekly team conferences will be held on Wednesday to discuss your progress.  Your Social Worker will talk with you frequently to get your input and to update you on team discussions.  Team conferences with you and your family in attendance may also be held.  Expected length of stay: 7-10 days Overall anticipated outcome: mod/i level  Depending on your progress and recovery, your program may change. Your Social Worker will coordinate services and will keep you informed of any changes. Your Social Worker's name and contact numbers are listed  below.  The following services may also be recommended but are not provided by the [facility] will be made to provide these services after discharge if needed.  Arrangements include referral to agencies that provide these services.  Your insurance has been verified to be:  [employer] Your primary doctor is:  [facility]  Pertinent information will be shared with your doctor and your insurance company.  Social Worker:  Ovidio Kin, [place] or [phone]  Information discussed with and copy given to patient by: [name], 12/15/2016, 10:03 AM

## 2016-12-15 NOTE — Progress Notes (Signed)
Physical Therapy Session Note  Patient Details  Name: Derek [name] MRN: [id number] Date of Birth: 04-Jun-1945  Today's Date: 12/15/2016 PT Individual Time: 9480-1655 PT Individual Time Calculation (min): 52 min   Short Term Goals: Week 1:  PT Short Term Goal 1 (Week 1): STG=LTG due to ELOS  Skilled Therapeutic Interventions/Progress Updates:   Pt supine in bed and agreeable to therapy, pain 5/10 as detailed below, wife present throughout session.   Worked on activity tolerance this session. Total A to don TLSO while supine, pt rolling R and L w/ Mod A. Transitioned to EOB and walked to bathroom w/ Min guard as pt initially stated he had to use the bathroom but denied once we were in bathroom. Returned to EOB and pt maintained static sitting balance w/o back support for 10 minutes while eating ice cream. Verbal cues to not use UE support while sitting w/o back support. Pt ambulated 48' w/ RW and Min guard, but denied going further. Returned to recliner and propped in neutral position w/ pillows. Educated pt and wife on importance of spending time out of bed during the day to increase tolerance to upright. Wife verbalized understanding and in agreement, pt was falling asleep at this time but did nod his head.   Ended session in recliner and in care of wife, all needs met.   Therapy Documentation Precautions:  Precautions Precautions: Back Precaution Booklet Issued: No Precaution Comments: pt unable to recall precautions Required Braces or Orthoses: Spinal Brace Spinal Brace: Thoracolumbosacral orthotic, Applied in supine position Restrictions Weight Bearing Restrictions: No Pain: Pain Assessment Pain Assessment: 0-10 Pain Score: 5  Pain Type: Surgical pain Pain Location: Back Pain Orientation: Mid;Lower Pain Descriptors / Indicators: Aching Pain Onset: On-going Pain Intervention(s): Medication (See eMAR);Repositioned Multiple Pain Sites: Yes 2nd Pain Site Pain Score: 5 Pain  Type: Chronic pain Pain Location: Knee Pain Orientation: Right;Anterior Pain Descriptors / Indicators: Aching Pain Frequency: Several days a week Pain Onset: On-going Patient's Stated Pain Goal: 2 Pain Intervention(s): Medication (See eMAR)   See Function Navigator for Current Functional Status.   Therapy/Group: Individual Therapy  Jami Bogdanski K Arnette 12/15/2016, 2:59 PM

## 2016-12-15 NOTE — IPOC Note (Signed)
Overall Plan of Care [facility]) Patient Details Name: Derek [name] MRN: [id number] DOB: 09/27/1945  Admitting Diagnosis: lumbar fusion  Hospital Problems: Active Problems:   Other fracture of fourth lumbar vertebra, initial encounter for closed fracture ([place])   Hypoalbuminemia due to protein-calorie malnutrition ([place])   Vitamin B 12 deficiency   Lethargic   Tobacco abuse     Functional Problem List: Nursing Endurance, Motor, Pain, Safety, Medication Management, Skin Integrity  PT Balance, Behavior, Endurance, Motor, Nutrition, Pain, Skin Integrity, Sensory  OT Balance, Cognition, Endurance, Pain, Safety  SLP Cognition  TR Edema, Endurance, Motor, Pain, Safety       Basic ADL's: OT Grooming, Bathing, Dressing, Toileting     Advanced  ADL's: OT       Transfers: PT Bed Mobility, Bed to Chair, Car, [employer], Futures trader, Metallurgist: PT Ambulation, Emergency planning/management officer, Stairs     Additional Impairments: OT None  SLP Social Cognition   Awareness, Attention, Memory, Problem Solving, Social Interaction  TR      Anticipated Outcomes Item Anticipated Outcome  Self Feeding independent  Swallowing      Basic self-care  supervision  Toileting  modified independent   Bathroom Transfers modified independent  Bowel/Bladder  Cont. BB with Mod I  Transfers  Mod I  Locomotion  supervision  Communication     Cognition  Min A   Pain  Less than 2  Safety/Judgment  to be able to call aproprately for care   Therapy Plan: PT Intensity: Minimum of 1-2 x/day ,45 to 90 minutes PT Frequency: 5 out of 7 days PT Duration Estimated Length of Stay: 7-10 days OT Intensity: Minimum of 1-2 x/day, 45 to 90 minutes OT Frequency: 5 out of 7 days OT Duration/Estimated Length of Stay: 7-9 days SLP Intensity: Minumum of 1-2 x/day, 30 to 90 minutes SLP Frequency: 3 to 5 out of 7 days SLP Duration/Estimated Length of Stay: 12/23/16    Team Interventions: Nursing  Interventions Patient/Family Education, Medication Management, Pain Management, Discharge Planning, Skin Care/Wound Management, Disease Management/Prevention  PT interventions Ambulation/gait training, Balance/vestibular training, Discharge planning, Disease management/prevention, DME/adaptive equipment instruction, Functional electrical stimulation, Patient/family education, Stair training, UE/LE Coordination activities, UE/LE Strength taining/ROM, Splinting/orthotics, Pain management, Neuromuscular re-education, Skin care/wound management, Therapeutic Exercise, Visual/perceptual remediation/compensation, Therapeutic Activities, Functional mobility training, Psychosocial support  OT Interventions Balance/vestibular training, Cognitive remediation/compensation, Discharge planning, Patient/family education, Self Care/advanced ADL retraining, Therapeutic Activities, Pain management, Community reintegration, DME/adaptive equipment instruction, Functional mobility training, Therapeutic Exercise  SLP Interventions Cognitive remediation/compensation, Cueing hierarchy, Functional tasks, Patient/family education, Therapeutic Activities, Internal/external aids, Environmental controls  TR Interventions    SW/CM Interventions Discharge Planning, Psychosocial Support, Patient/Family Education   Barriers to Discharge MD  Medical stability and Behavior  Nursing      PT      OT      SLP Behavior decreased participation/lack of awareness for need for SLP   SW       Team Discharge Planning: Destination: PT-Home ,OT- Home , SLP-Home Projected Follow-up: PT-Home health PT, OT-  Home health OT, SLP-24 hour supervision/assistance (TBD) Projected Equipment Needs: PT-To be determined, OT- To be determined, SLP-None recommended by SLP Equipment Details: PT-pt already has RW, OT-  Patient/family involved in discharge planning: PT- Family member/caregiver,  OT-Patient, Family member/caregiver, SLP-Patient, Family  member/caregiver  MD ELOS: 7-10 days. 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 He was admitted on   12/06/16 with confusion, leucocytosis, recent back pain due to UTI and fall on [facility] way [facility] ED. He was found to have L3 Chance type fracture with 25% compression fracture of L4 vertebral body.  Blood cultures X 2 done and he was started on IV antibiotics and transferred to [facility] for treatment.  MRI lumbar spine done 8/22 revealing L3 and L4 compression fractures, abnormal contrast and edema right psoas and posterior paraspinal fluid collection at L3-S1 levels. He underwent exploration of lumbar fusion with ORIF L3  And L4 Chance fractures with open Kyphoplasty and PLIF L2-L5 and redo arthrodesis L2-S1 by Dr. Arnoldo Morale on 8/22. 1/2 blood cultures positive for micrococcus and felt to be contaminant. Dr. Megan Salon consulted for input and recommended discontinuation of antibiotics. Leucocytosis resolved and post op ABLA noted.   He continues to have issues with lethargy and to be  at high risk for falls due to poor safety with impairments in mobility as well as difficulty completing ADL tasks. Will set goals for Mod I/Supervision with PT/OT and Min A with SLP.   See Team Conference Notes for weekly updates to the plan of care

## 2016-12-15 NOTE — Progress Notes (Signed)
[name] PHYSICAL MEDICINE & REHABILITATION     PROGRESS NOTE  Subjective/Complaints:  Pt seen laying in bed this AM.  He states he slept well per his wife.  Wife requests Norco for pain and for me to inform his dentist of hospitalization.   ROS: Denies CP, SOB, N/V/D.  Objective: Vital Signs: Blood pressure 123/63, pulse 89, temperature 97.6 F (36.4 C), temperature source Oral, resp. rate 18, height 5\' 9"  (1.753 m), weight 86.7 kg (191 lb 3.2 oz), SpO2 96 %. No results found.  Recent Labs  12/13/16 0610 12/15/16 0533  WBC 10.3 9.8  HGB 8.6* 9.0*  HCT 26.7* 28.9*  PLT 421* 549*    Recent Labs  12/13/16 0610 12/15/16 0533  NA 137 136  K 3.5 4.1  CL 100* 97*  GLUCOSE 107* 98  BUN 5* 8  CREATININE 0.78 0.86  CALCIUM 8.6* 8.5*   CBG (last 3)  No results for input(s): GLUCAP in the last 72 hours.  Wt Readings from Last 3 Encounters:  12/15/16 86.7 kg (191 lb 3.2 oz)  12/09/16 98.8 kg (217 lb 13 oz)  09/23/16 99.3 kg (219 lb)    Physical Exam:  BP 123/63 (BP Location: Left Arm)   Pulse 89   Temp 97.6 F (36.4 C) (Oral)   Resp 18   Ht 5\' 9"  (1.753 m)   Wt 86.7 kg (191 lb 3.2 oz)   SpO2 96%   BMI 28.24 kg/m  Constitutional: He appears well-developed and well-nourished. He appears ill. No distress.  HENT: Normocephalic and atraumatic.  Eyes: EOM are normal. No discharge.  Cardiovascular: Normal rate and regular rhythm.  No JVD. Respiratory: Effort normal and breath sounds normal.  GI: Soft. Bowel sounds are normal. Musculoskeletal: He exhibits no edema or tenderness.  Neurological: He is alert and oriented to self and place Motor: 4/5 grossly throughout.  Skin: Skin is warm and dry. He is not diaphoretic.  Back incision c/d/i  Psychiatric: His mood appears anxious. He expresses impulsivity. He is noncommunicative. He exhibits abnormal recent memory. He is inattentive.    Assessment/Plan: 1. Functional deficits secondary to lumbar chance fractures s/p  PLIF and kyphoplasty which require 3+ hours per day of interdisciplinary therapy in a comprehensive inpatient rehab setting. Physiatrist is providing close team supervision and 24 hour management of active medical problems listed below. Physiatrist and rehab team continue to assess barriers to discharge/monitor patient progress toward functional and medical goals.  Function:  Bathing Bathing position      Bathing parts      Bathing assist        Upper Body Dressing/Undressing Upper body dressing                    Upper body assist        Lower Body Dressing/Undressing Lower body dressing                                  Lower body assist        Toileting Toileting          Toileting assist     Transfers Chair/bed transfer             Locomotion Ambulation           Wheelchair          Cognition Comprehension    Expression    Social Interaction  Problem Solving    Memory      Medical Problem List and Plan: 1.  Cognitive deficits, weakness, poor activity toleratnace secondary to lumbar chance fractures s/p PLIF and kyphoplasty.  Begin CIR 2.  DVT Prophylaxis/Anticoagulation: Mechanical: Sequential compression devices, below knee Bilateral lower extremities 3. Pain Management: Limit neurosedating meds--decreased oxycodone to 5 mg prn severe pain.  Resumed low dose Neurontin at nights to help with neuropathy and sleep.  4. Mood: LCSW to follow for evaluation and support.  5. Neuropsych: This patient is not capable of making decisions on his own behalf. 6. Skin/Wound Care: Routine wound care.  7. Fluids/Electrolytes/Nutrition: Monitor I/Os. Monitor I/Os. Maintain adequate nutritional and hydration sources. Offer supplements between meals during the day and family encouraged to bring food from home 8. HTN: Monitor BP bid- continue lisinopril daily.   Monitor with increased mobility 9. ABLA: Post surgery. Will monitor for now.    Hb 9.0 on 8/28  Cont to monitor 10. Lethargy: Decreased Oxycodone to 5 mg and change robaxin to po prn.   11. Hypokalemia: Resolved with supplement--encourage intake   K+ 4.1 on 8/28  Cont to monitor 12. Vitamin B 12 deficiency:  Vitamin B12 - 104. He was started on B12 injections on 8/24--continue weekly X 4 doses.  13. Neuropathy: Was on Neurontin 600 mg tid at home but had sedating SE per wife. Resumed low dose at nights.  14. COPD: Currently is 2 PPD smoker but asymptomatic/no issues in the past per wife.   Will consider Nicotine patch. 15. Hypoalbuminemia  Supplement initiated 8/28  LOS (Days) 1 A FACE TO FACE EVALUATION WAS PERFORMED  Derek [name] 12/15/2016 8:06 AM

## 2016-12-15 NOTE — Evaluation (Signed)
Physical Therapy Assessment and Plan  Patient Details  Name: Derek [name] MRN: [id number] Date of Birth: Jul 01, 1945  PT Diagnosis: Abnormal posture, Difficulty walking, Low back pain and Muscle weakness Rehab Potential: Good ELOS: 7-10 days   Today's Date: 12/15/2016 PT Individual Time: 0800-0915 PT Individual Time Calculation (min): 75 min    Problem List:  Patient Active Problem List   Diagnosis Date Noted  . Hypoalbuminemia due to protein-calorie malnutrition ([place])   . Vitamin B 12 deficiency   . Lethargic   . Other fracture of fourth lumbar vertebra, initial encounter for closed fracture ([place]) 12/14/2016  . Post-operative pain   . Benign essential HTN   . Acute blood loss anemia   . Lethargy   . B12 deficiency 12/12/2016  . Positive blood culture 12/10/2016  . Status post lumbar spinal fusion 12/10/2016  . Hyponatremia 12/08/2016  . Hypokalemia 12/08/2016  . Lactic acidosis 12/08/2016  . Normocytic anemia 12/08/2016  . Anxiety 12/08/2016  . COPD (chronic obstructive pulmonary disease) ([place]) 12/08/2016  . Back pain 12/08/2016  . Compression fracture of lumbar vertebra ([place]) 12/08/2016  . Thrombocytosis ([place]) 12/08/2016  . Sepsis ([place]) 12/06/2016  . Altered mental status 12/06/2016  . Lumbar stenosis with neurogenic claudication 09/23/2016  . Avascular necrosis of bone of right hip ([place]) 11/20/2015  . OA (osteoarthritis) of hip 11/20/2015  . Hepatomegaly 12/15/2011  . Adenomatous polyps 12/15/2011  . Atherosclerosis of native arteries of the extremities with intermittent claudication 09/15/2011  . Pain in limb 09/15/2011    Past Medical History:  Past Medical History:  Diagnosis Date  . Adenomatous polyps    remote past, on 5 year surveillance  . Arthritis   . Bell's palsy 40+yrs ago  . Cancer ([place])    skin  . COPD (chronic obstructive pulmonary disease) ([facility])    emphysema, pt. states he is not aware of this  . Diverticulitis   . Dyspnea    pt. denies   . Enlarged prostate   . Flat affect   . Headache(784.0)    having recently  . Hip pain 11/2016  . Hypertension   . Peripheral vascular disease ([place])   . Pneumonia   . Sepsis ([place]) 11/2016   Past Surgical History:  Past Surgical History:  Procedure Laterality Date  . BACK SURGERY  09/2016  . COLONOSCOPY  07/30/2005   Internal hemorrhoids; otherwise, normal rectum/ Left-sided diverticula  . COLONOSCOPY  01/11/2012   Procedure: COLONOSCOPY;  Surgeon: Daneil Dolin, MD;  [facility];  Service: Endoscopy;  Laterality: N/A;  1:15  . ILIAC ARTERY STENT  2006   Left CIA stenting  . KYPHOPLASTY N/A 12/09/2016   Procedure: Vertebroplasty;  Surgeon: Newman Pies, MD;  Location: [facility];  Service: Neurosurgery;  Laterality: N/A;  . ROTATOR CUFF REPAIR Right    3 different surgeries  . SINUS ENDO W/FUSION Left 08/18/2013   Procedure: ENDOSCOPIC SINUS SURGERY WITH FUSION NAVIGATION;  Surgeon: Melissa Montane, MD;  Location: [facility];  Service: ENT;  Laterality: Left;  . SINUS ENDO W/FUSION Left 12/21/2013   Procedure: ENDOSCOPIC SINUS SURGERY WITH FUSION NAVIGATION;  Surgeon: Melissa Montane, MD;  Location: [facility];  Service: ENT;  Laterality: Left;  ESS with fusion left frontal sinus with Rain Stent  . SPINE SURGERY     lumbar & cervical spine surgeries X 8   . TOTAL HIP ARTHROPLASTY Right 11/20/2015   Procedure: RIGHT TOTAL HIP ARTHROPLASTY ANTERIOR APPROACH;  Surgeon: Gaynelle Arabian, MD;  Location:  [facility];  Service: Orthopedics;  Laterality: Right;  Marland Kitchen VASECTOMY      Assessment & Plan Clinical Impression: Patient is a 71 y.o. year old male with h/o of COPD (per X ray), PVD, multiple back surgeries with chronic back pain, neurogenic claudication due to L1 fracture and underwent  L1-L2 fusion with extension to T-10 in June 2018. He was admitted on   12/06/16 with confusion, leucocytosis, recent back pain due to UTI and fall on the way to [facility] ED. He was found to have L3 Chance type fracture with 25%  compression fracture of L4 vertebral body.  Blood cultures X 2 done and he was started on IV antibiotics and transferred to [facility] for treatment.  MRI lumbar spine done 8/22 revealing L3 and L4 compression fractures, abnormal contrast and edema right psoas and posterior paraspinal fluid collection at L3-S1 levels. He underwent exploration of lumbar fusion with ORIF L3  And L4 Chance fractures with open Kyphoplasty and PLIF L2-L5 and redo arthrodesis L2-S1 by Dr. Arnoldo Morale on 8/22. 1/2 blood cultures positive for micrococcus and felt to be contaminant. Dr. Megan Salon consulted for input and recommended discontinuation of antibiotics. Leucocytosis resolved and post op ABLA noted.   He continues to have issues with lethargy needing max directional cues to follow commands/tasks. He continues to be  at high risk for falls due to poor safety with impairments in mobility as well as difficulty completing ADL tasks. CIR recommended for follow up therapy. Patient transferred to CIR on 12/14/2016 .   Patient currently requires mod with mobility secondary to muscle weakness and muscle joint tightness, decreased cardiorespiratoy endurance and decreased sitting balance, decreased standing balance, decreased balance strategies and difficulty maintaining precautions.  Prior to hospitalization, patient was modified independent  with mobility and lived with Spouse in a House home.  Home access is  Level entry.  Patient will benefit from skilled PT intervention to maximize safe functional mobility, minimize fall risk and decrease caregiver burden for planned discharge home with 24 hour supervision.  Anticipate patient will benefit from follow up [place] at discharge.  PT - End of Session Activity Tolerance: Decreased this session;Tolerates 10 - 20 min activity with multiple rests Endurance Deficit: Yes PT Assessment Rehab Potential (ACUTE/IP ONLY): Good PT Patient demonstrates impairments in the following area(s):  Balance;Behavior;Endurance;Motor;Nutrition;Pain;Skin Integrity;Sensory PT Transfers Functional Problem(s): Bed Mobility;Bed to Chair;Car;Furniture;Floor PT Locomotion Functional Problem(s): Ambulation;Wheelchair Mobility;Stairs PT Plan PT Intensity: Minimum of 1-2 x/day ,45 to 90 minutes PT Frequency: 5 out of 7 days PT Duration Estimated Length of Stay: 7-10 days PT Treatment/Interventions: Ambulation/gait training;Balance/vestibular training;Discharge planning;Disease management/prevention;DME/adaptive equipment instruction;Functional electrical stimulation;Patient/family education;Stair training;UE/LE Coordination activities;UE/LE Strength taining/ROM;Splinting/orthotics;Pain management;Neuromuscular re-education;Skin care/wound management;Therapeutic Exercise;Visual/perceptual remediation/compensation;Therapeutic Activities;Functional mobility training;Psychosocial support PT Transfers Anticipated Outcome(s): Mod I PT Locomotion Anticipated Outcome(s): supervision PT Recommendation Follow Up Recommendations: Home health PT Patient destination: Home Equipment Recommended: To be determined Equipment Details: pt already has RW  Skilled Therapeutic Intervention Evaluation completed (see details above and below) with education on PT POC and goals and individual treatment initiated with focus on functional mobility including bed mobility with mod assist secondary to pain and ensuring log roll technique to maintain precautions and don brace, stand pivot transfers w/c<>bed and w/c<>car with min assist using RW, ambulation up to 100 ft using RW and min assist, sit<>stands with min assist and verbal cues for precautions. Wife present for entire session, discussed d/c planning and home layout, states pt already has a RW from previous hip replacement. During ambulation pt  requires verbal and tactile cues for upright posture and increased step length. Pt left in recliner at the end of session with needs in  reach and wife present.   PT Evaluation Precautions/Restrictions Precautions Precautions: Back Precaution Booklet Issued: No Precaution Comments: pt unable to recall precautions, pt reeducated Required Braces or Orthoses: Spinal Brace Spinal Brace: Thoracolumbosacral orthotic;Applied in supine position Restrictions Weight Bearing Restrictions: No General   Vital Signs  Pain Pain Assessment Pain Score: Asleep Home Living/Prior Functioning Home Living Available Help at Discharge: Family;Available 24 hours/day Type of Home: House Home Access: Level entry Home Layout: One level Additional Comments: Pt lives at home with wife  Lives With: Spouse Prior Function Level of Independence: Needs assistance with ADLs;Independent with gait Vocation: On disability Comments: Wife assists with ADL's Cognition Overall Cognitive Status: Impaired/Different from baseline Arousal/Alertness: Lethargic Orientation Level: Oriented X4 Attention: Selective Selective Attention: Appears intact Memory: Appears intact Awareness: Appears intact Problem Solving: Impaired Safety/Judgment: Appears intact Sensation Sensation Light Touch: Appears Intact Stereognosis: Not tested Hot/Cold: Not tested Proprioception: Appears Intact Coordination Gross Motor Movements are Fluid and Coordinated: No (B LEs, secondary to weakness and tightness) Fine Motor Movements are Fluid and Coordinated: Yes (B LEs.) Motor  Motor Motor: Other (comment) Motor - Skilled Clinical Observations: generalized weakness  Trunk/Postural Assessment  Cervical Assessment Cervical Assessment: Exceptions to [facility] (forward head posture) Thoracic Assessment Thoracic Assessment: Exceptions to [facility] (upper thoracic kyphosis, lower thoracic previous spinal fusion) Lumbar Assessment Lumbar Assessment: Exceptions to [facility] (limited ROM secondary to spinal fusion, posterior pelvic tilt) Postural Control Postural Control: Within Functional  Limits  Balance Balance Balance Assessed: Yes Static Sitting Balance Static Sitting - Level of Assistance: 5: Stand by assistance Dynamic Sitting Balance Dynamic Sitting - Level of Assistance: 4: Min assist Static Standing Balance Static Standing - Level of Assistance: 4: Min assist Dynamic Standing Balance Dynamic Standing - Level of Assistance: 4: Min assist Extremity Assessment  RLE Assessment RLE Assessment: Exceptions to [facility] (generalized weakness grossly 3/5 (weaker proximally), shortened hamstring length) LLE Assessment LLE Assessment: Exceptions to [facility] (generalized weakness grossly 3+/5 (weaker proximally), shortened hamstring length)   See Function Navigator for Current Functional Status.   Refer to Care Plan for Long Term Goals  Recommendations for other services: None   Discharge Criteria: Patient will be discharged from PT if patient refuses treatment 3 consecutive times without medical reason, if treatment goals not met, if there is a change in medical status, if patient makes no progress towards goals or if patient is discharged from hospital.  The above assessment, treatment plan, treatment alternatives and goals were discussed and mutually agreed upon: by patient and by family  Netta Corrigan, PT, DPT 12/15/2016, 10:11 AM

## 2016-12-15 NOTE — Progress Notes (Signed)
Patient information reviewed and entered into eRehab system by Daiva Nakayama, RN, CRRN, [place] Coordinator.  Information including medical coding and functional independence measure will be reviewed and updated through discharge.     Per nursing patient and spouse given "Data Collection Information Summary for Patients in Inpatient Rehabilitation Facilities with attached "Privacy Act [place] Records" upon admission.

## 2016-12-15 NOTE — Plan of Care (Signed)
Problem: RH BLADDER ELIMINATION Goal: RH STG MANAGE BLADDER WITH ASSISTANCE STG Manage Bladder With Assistance  Outcome: Progressing  12/15/16 0251  Bladder Management Goals  STG: Pt will manage bladder with assistance 3-Moderate assistance    Problem: RH SKIN INTEGRITY Goal: RH STG MAINTAIN SKIN INTEGRITY WITH ASSISTANCE STG Maintain Skin Integrity With Assistance.  Outcome: Progressing  12/15/16 0251  Skin Integrity Goals  STG: Maintain skin integrity with assistance 3-Moderate assistance   Goal: RH STG ABLE TO PERFORM INCISION/WOUND CARE W/ASSISTANCE STG Able To Perform Incision/Wound Care With Assistance.  Outcome: Not Applicable Date Met: 41/58/30 Incision site is on back. Pt unable to perform care to incision.  12/15/16 0251  Skin Integrity Goals  STG: Pt will be able to perform incision/wound care with assistance 1-Total assistance    Problem: RH PAIN MANAGEMENT Goal: RH STG PAIN MANAGED AT OR BELOW PT'S PAIN GOAL Outcome: Progressing Maintain pain level less than 2 during admission

## 2016-12-15 NOTE — Progress Notes (Signed)
Social Work Assessment and Plan Social Work Assessment and Plan  Patient Details  Name: Derek [name] MRN: [id number] Date of Birth: [birth year]  Today's Date: 12/15/2016  Problem List:  Patient Active Problem List   Diagnosis Date Noted  . Hypoalbuminemia due to protein-calorie malnutrition ([place])   . Vitamin B 12 deficiency   . Lethargic   . Other fracture of fourth lumbar vertebra, initial encounter for closed fracture ([place]) 12/14/2016  . Post-operative pain   . Benign essential HTN   . Acute blood loss anemia   . Lethargy   . B12 deficiency 12/12/2016  . Positive blood culture 12/10/2016  . Status post lumbar spinal fusion 12/10/2016  . Hyponatremia 12/08/2016  . Hypokalemia 12/08/2016  . Lactic acidosis 12/08/2016  . Normocytic anemia 12/08/2016  . Anxiety 12/08/2016  . COPD (chronic obstructive pulmonary disease) ([place]) 12/08/2016  . Back pain 12/08/2016  . Compression fracture of lumbar vertebra ([place]) 12/08/2016  . Thrombocytosis ([place]) 12/08/2016  . Sepsis ([place]) 12/06/2016  . Altered mental status 12/06/2016  . Lumbar stenosis with neurogenic claudication 09/23/2016  . Avascular necrosis of bone of right hip ([place]) 11/20/2015  . OA (osteoarthritis) of hip 11/20/2015  . Hepatomegaly 12/15/2011  . Adenomatous polyps 12/15/2011  . Atherosclerosis of native arteries of the extremities with intermittent claudication 09/15/2011  . Pain in limb 09/15/2011   Past Medical History:  Past Medical History:  Diagnosis Date  . Adenomatous polyps    remote past, on 5 year surveillance  . Arthritis   . Bell's palsy 40+yrs ago  . Cancer ([place])    skin  . COPD (chronic obstructive pulmonary disease) (H[facility])    emphysema, pt. states he is not aware of this  . Diverticulitis   . Dyspnea    pt. denies  . Enlarged prostate   . Flat affect   . Headache(784.0)    having recently  . Hip pain 11/2016  . Hypertension   . Peripheral vascular disease ([place])   . Pneumonia   . Sepsis  ([place]) 11/2016   Past Surgical History:  Past Surgical History:  Procedure Laterality Date  . BACK SURGERY  09/2016  . COLONOSCOPY  07/30/2005   Internal hemorrhoids; otherwise, normal rectum/ Left-sided diverticula  . COLONOSCOPY  01/11/2012   Procedure: COLONOSCOPY;  Surgeon: Daneil Dolin, MD;  [facility];  Service: Endoscopy;  Laterality: N/A;  1:15  . ILIAC ARTERY STENT  2006   Left CIA stenting  . KYPHOPLASTY N/A 12/09/2016   Procedure: Vertebroplasty;  Surgeon: Newman Pies, MD;  Location: [place];  Service: Neurosurgery;  Laterality: N/A;  . ROTATOR CUFF REPAIR Right    3 different surgeries  . SINUS ENDO W/FUSION Left 08/18/2013   Procedure: ENDOSCOPIC SINUS SURGERY WITH FUSION NAVIGATION;  Surgeon: Melissa Montane, MD;  Location: [place];  Service: ENT;  Laterality: Left;  . SINUS ENDO W/FUSION Left 12/21/2013   Procedure: ENDOSCOPIC SINUS SURGERY WITH FUSION NAVIGATION;  Surgeon: Melissa Montane, MD;  Location: [place];  Service: ENT;  Laterality: Left;  ESS with fusion left frontal sinus with Rain Stent  . SPINE SURGERY     lumbar & cervical spine surgeries X 8   . TOTAL HIP ARTHROPLASTY Right 11/20/2015   Procedure: RIGHT TOTAL HIP ARTHROPLASTY ANTERIOR APPROACH;  Surgeon: Gaynelle Arabian, MD;  [facility];  Service: Orthopedics;  Laterality: Right;  Marland Kitchen VASECTOMY     Social History:  reports that he has been smoking Cigarettes.  He has a  104.00 pack-year smoking history. He has never used smokeless tobacco. He reports that he does not drink alcohol or use drugs.  Family / Support Systems Marital Status: Married Patient Roles: Spouse, Parent Spouse/Significant Other: [name] [phone] Other Supports: Friends Anticipated Caregiver: Wife Ability/Limitations of Caregiver: No limitations Caregiver Availability: 24/7 Family Dynamics: Close knit with family and extended family, they rely upon one another and hope the recovery is quick and less painful. He has  been through this many times before and knows what to expect.  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 Wife plans to be here daily and will assist and provide emotional support   Abuse/Neglect Physical Abuse: Denies Verbal Abuse: Denies Sexual Abuse: Denies Exploitation of patient/patient's resources: Denies Self-Neglect: Denies  Emotional Status Pt's affect, behavior adn adjustment status: Pt is trying to work through the pain and participate in his therapies. He is wanting to do well and get back home. His wife reports he is moving it is just the pain he is having that makes it unbearable.  Recent Psychosocial Issues: other medical issues-past back surgeries x10 Pyschiatric History: No issues Substance Abuse History: no issues  Patient / Family Perceptions, Expectations & Goals Pt/Family understanding of illness & functional limitations: Pt and wife can explain his surgery and since has had so many before this one. They talk with the MD and are working on his pain issues and trying to get a happy medium.  Premorbid pt/family roles/activities: Hsband, retiree, father, home owner, etc Anticipated changes in roles/activities/participation: resume Pt/family expectations/goals: Pt states: " I want to have less pain before I leave here."  Wife states: " He is moving well it is the pain that he has that limits him."  [employer]: Other (Comment) (had in the past) Premorbid Home Care/DME Agencies: Other (Comment) (has all his DME) Transportation available at discharge: wife  Discharge Planning Living Arrangements: Spouse/significant other Support Systems:  Spouse/significant other, Other relatives, Friends/neighbors, Social worker community Type of Residence: Private residence Insurance Resources: Multimedia programmer (specify) ([employer]vant[employer]) Financial Resources: Social Security Financial Screen Referred: No Living Expenses: Own Money Management: Spouse, Patient Does the patient have any problems obtaining your medications?: No Home Management: Wife does the home management Patient/Family Preliminary Plans: Return home with wife who can provide assist if needed. She was assisitng prior to admission before this last surgery. Made aware team evaluating and setting goals will have target discharge date tomorrow. Social Work Anticipated Follow Up Needs: HH/OP  Clinical Impression Pleasant gentleman who is noticeably in pain with his back. He has been through two therapies this am and is hurting. He wants to do well and get back home soon but wants to be ready also. Wife is here to provide emotional support and watch him in therapies. She has provided care before after back surgery and is prepared to do so again. Will work on discharge needs.   [name] 12/15/2016, 12:04 PM

## 2016-12-15 NOTE — Evaluation (Signed)
Occupational Therapy Assessment and Plan  Patient Details  Name: Derek [name] MRN: [id number] Date of Birth: April 27, 1945  OT Diagnosis: abnormal posture, acute pain, cognitive deficits, muscle weakness (generalized) and pain in joint Rehab Potential: Rehab Potential (ACUTE ONLY): Good ELOS: 7-9 days   Today's Date: 12/15/2016 OT Individual Time: 1655-3748 OT Individual Time Calculation (min): 57 min     Problem List:  Patient Active Problem List   Diagnosis Date Noted  . Hypoalbuminemia due to protein-calorie malnutrition ([place])   . Vitamin B 12 deficiency   . Lethargic   . Other fracture of fourth lumbar vertebra, initial encounter for closed fracture ([place]) 12/14/2016  . Post-operative pain   . Benign essential HTN   . Acute blood loss anemia   . Lethargy   . B12 deficiency 12/12/2016  . Positive blood culture 12/10/2016  . Status post lumbar spinal fusion 12/10/2016  . Hyponatremia 12/08/2016  . Hypokalemia 12/08/2016  . Lactic acidosis 12/08/2016  . Normocytic anemia 12/08/2016  . Anxiety 12/08/2016  . COPD (chronic obstructive pulmonary disease) ([place]) 12/08/2016  . Back pain 12/08/2016  . Compression fracture of lumbar vertebra ([place]) 12/08/2016  . Thrombocytosis ([place]) 12/08/2016  . Sepsis ([place]) 12/06/2016  . Altered mental status 12/06/2016  . Lumbar stenosis with neurogenic claudication 09/23/2016  . Avascular necrosis of bone of right hip ([place]) 11/20/2015  . OA (osteoarthritis) of hip 11/20/2015  . Hepatomegaly 12/15/2011  . Adenomatous polyps 12/15/2011  . Atherosclerosis of native arteries of the extremities with intermittent claudication 09/15/2011  . Pain in limb 09/15/2011    Past Medical History:  Past Medical History:  Diagnosis Date  . Adenomatous polyps    remote past, on 5 year surveillance  . Arthritis   . Bell's palsy 40+yrs ago  . Cancer ([place])    skin  . COPD (chronic obstructive pulmonary disease) (H[facility])    emphysema, pt. states he is not  aware of this  . Diverticulitis   . Dyspnea    pt. denies  . Enlarged prostate   . Flat affect   . Headache(784.0)    having recently  . Hip pain 11/2016  . Hypertension   . Peripheral vascular disease ([place])   . Pneumonia   . Sepsis ([place]) 11/2016   Past Surgical History:  Past Surgical History:  Procedure Laterality Date  . BACK SURGERY  09/2016  . COLONOSCOPY  07/30/2005   Internal hemorrhoids; otherwise, normal rectum/ Left-sided diverticula  . COLONOSCOPY  01/11/2012   Procedure: COLONOSCOPY;  Surgeon: Daneil Dolin, MD;  [facility];  Service: Endoscopy;  Laterality: N/A;  1:15  . ILIAC ARTERY STENT  2006   Left CIA stenting  . KYPHOPLASTY N/A 12/09/2016   Procedure: Vertebroplasty;  Surgeon: Newman Pies, MD;  Location: [place];  Service: Neurosurgery;  Laterality: N/A;  . ROTATOR CUFF REPAIR Right    3 different surgeries  . SINUS ENDO W/FUSION Left 08/18/2013   Procedure: ENDOSCOPIC SINUS SURGERY WITH FUSION NAVIGATION;  Surgeon: Melissa Montane, MD;  Location: [place];  Service: ENT;  Laterality: Left;  . SINUS ENDO W/FUSION Left 12/21/2013   Procedure: ENDOSCOPIC SINUS SURGERY WITH FUSION NAVIGATION;  Surgeon: Melissa Montane, MD;  Location: [place];  Service: ENT;  Laterality: Left;  ESS with fusion left frontal sinus with Rain Stent  . SPINE SURGERY     lumbar & cervical spine surgeries X 8   . TOTAL HIP ARTHROPLASTY Right 11/20/2015   Procedure: RIGHT TOTAL HIP ARTHROPLASTY ANTERIOR  APPROACH;  Surgeon: Gaynelle Arabian, MD;  [facility];  Service: Orthopedics;  Laterality: Right;  Marland Kitchen VASECTOMY      Assessment & Plan Clinical Impression: Patient is a 71 y.o. year old male with recent admission to the hospital on 12/06/16 with confusion, leucocytosis, recent back pain due to UTI and fall on the way [facility] ED. He was found to have L3 Chance type fracture with 25% compression fracture of L4 vertebral body.  Blood cultures X 2 done and he was started on IV antibiotics and  transferred to [facility] for treatment.  MRI lumbar spine done 8/22 revealing L3 and L4 compression fractures, abnormal contrast and edema right psoas and posterior paraspinal fluid collection at L3-S1 levels. He underwent exploration of lumbar fusion with ORIF L3  And L4 Chance fractures with open Kyphoplasty and PLIF L2-L5 and redo arthrodesis L2-S1 by Dr. Arnoldo Morale on 8/22. 1/2 blood cultures positive for micrococcus and felt to be contaminant. Dr. Megan Salon consulted for input and recommended discontinuation of antibiotics. Leucocytosis resolved and post op ABLA noted. Patient transferred to CIR on 12/14/2016 .    Patient currently requires mod with basic self-care skills secondary to muscle weakness, decreased attention, decreased awareness, decreased safety awareness, decreased memory and delayed processing and decreased sitting balance, decreased standing balance, decreased postural control, decreased balance strategies and difficulty maintaining precautions.  Prior to hospitalization, patient could complete ADLs with modified independent .  Patient will benefit from skilled intervention to decrease level of assist with basic self-care skills and increase independence with basic self-care skills prior to discharge home with care partner.  Anticipate patient will require 24 hour supervision and follow up home health.  OT - End of Session Activity Tolerance: Decreased this session Endurance Deficit: Yes OT Assessment Rehab Potential (ACUTE ONLY): Good OT Patient demonstrates impairments in the following area(s): Balance;Cognition;Endurance;Pain;Safety OT Basic ADL's Functional Problem(s): Grooming;Bathing;Dressing;Toileting OT Transfers Functional Problem(s): Toilet;Tub/Shower OT Additional Impairment(s): None OT Plan OT Intensity: Minimum of 1-2 x/day, 45 to 90 minutes OT Frequency: 5 out of 7 days OT Duration/Estimated Length of Stay: 7-9 days OT Treatment/Interventions: Balance/vestibular  training;Cognitive remediation/compensation;Discharge planning;Patient/family education;Self Care/advanced ADL retraining;Therapeutic Activities;Pain management;Community reintegration;DME/adaptive equipment instruction;Functional mobility training;Therapeutic Exercise OT Self Feeding Anticipated Outcome(s): independent OT Basic Self-Care Anticipated Outcome(s): supervision OT Toileting Anticipated Outcome(s): modified independent OT Bathroom Transfers Anticipated Outcome(s): modified independent OT Recommendation Patient destination: Home Follow Up Recommendations: Home health OT Equipment Recommended: To be determined   Skilled Therapeutic Intervention Pt began working on selfcare retraining sit to stand from the bedside recliner.  Pt's spouse present for session and supportive.  Pt with eyes closed 30% of session, requiring mod instructional cueing to keep them open during bathing and dressing tasks.  Pt needed mod to max instructional cueing to sequence through bathing tasks secondary to decreased initiation and decreased sustained attention.  He was not able to state any of his back precautions and initially could not state he was here for back surgery when asked.  Min assist for sit to stand and for functional mobility to the bathroom for toileting tasks.  Pt with increased knee flexion and trunk flexion noted in standing.  Pt left in recliner at end of session with spouse present.  Discussed expectations of LOS and goals as well. Safety belt in place.   OT Evaluation Precautions/Restrictions  Precautions Precautions: Back Precaution Booklet Issued: No Precaution Comments: pt unable to recall precautions Required Braces or Orthoses: Spinal Brace Spinal Brace: Thoracolumbosacral orthotic;Applied in supine position Restrictions  Weight Bearing Restrictions: No   Pain Pain Assessment Pain Assessment: 0-10 Pain Score: 5  Pain Type: Surgical pain Pain Location: Back Pain Orientation:  Mid;Lower Pain Descriptors / Indicators: Aching Pain Onset: On-going Pain Intervention(s): Medication (See eMAR);Repositioned Multiple Pain Sites: Yes 2nd Pain Site Pain Score: 5 Pain Type: Chronic pain Pain Location: Knee Pain Orientation: Right;Anterior Pain Descriptors / Indicators: Aching Pain Frequency: Several days a week Pain Onset: On-going Patient's Stated Pain Goal: 2 Pain Intervention(s): Medication (See eMAR) Home Living/Prior Functioning Home Living Living Arrangements: Spouse/significant other Available Help at Discharge: Family, Available 24 hours/day Type of Home: House Home Access: Level entry Home Layout: One level Bathroom Shower/Tub: Chiropodist: Handicapped height Bathroom Accessibility: Yes Additional Comments: Pt lives at home with wife  Lives With: Spouse IADL History Homemaking Responsibilities: No Current License: Yes Mode of Transportation: Musician Occupation: Retired, On disability Prior Function Level of Independence: Independent with basic ADLs Driving: Yes Vocation: On disability Comments: Wife assists with ADL's ADL  See Function Section for details  Vision Baseline Vision/History: Wears glasses Wears Glasses: Reading only Patient Visual Report: No change from baseline Vision Assessment?: No apparent visual deficits Perception  Perception: Within Functional Limits Praxis Praxis: Intact Cognition Overall Cognitive Status: Impaired/Different from baseline Arousal/Alertness: Lethargic Orientation Level: Place;Person;Situation Person: Oriented Place: Oriented Situation: Disoriented Year: 2021 Month: August Day of Week: Incorrect Memory: Impaired Memory Impairment: Decreased recall of new information Immediate Memory Recall: Sock;Blue;Bed Memory Recall:  (None could be remembered) Attention: Sustained Sustained Attention: Impaired Sustained Attention Impairment: Functional basic;Verbal basic Selective  Attention: Impaired Selective Attention Impairment: Functional basic;Verbal basic Awareness: Impaired Problem Solving: Impaired Safety/Judgment: Impaired Comments: Pt with impaired sustained attention, required mod to max assist to initiate most selfcare tasks.  At times would have eyes closed, needing cueing to keep them open. Sensation Sensation Light Touch: Appears Intact Stereognosis: Appears Intact Hot/Cold: Appears Intact Proprioception: Appears Intact Coordination Gross Motor Movements are Fluid and Coordinated: Yes Fine Motor Movements are Fluid and Coordinated: Yes Coordination and Movement Description: Coordination WFLS for UE use bilaterally Motor  Motor Motor - Skilled Clinical Observations: generalized weakness Mobility  Transfers Transfers: Sit to Stand;Stand to Sit Sit to Stand: 4: Min assist;With upper extremity assist;With armrests;From toilet Stand to Sit: 4: Min assist;With upper extremity assist;To toilet  Trunk/Postural Assessment  Cervical Assessment Cervical Assessment: Exceptions to [facility] (flexed head at rest) Thoracic Assessment Thoracic Assessment: Exceptions to [facility] (thoracic kyphosis in sitting and with standing) Lumbar Assessment Lumbar Assessment: Exceptions to [facility] (wearing TLSO and still with increased lumbar flexion in sitting and standing)  Balance Balance Balance Assessed: Yes Static Sitting Balance Static Sitting - Balance Support: Left upper extremity supported;Feet supported Static Sitting - Level of Assistance: 5: Stand by assistance Dynamic Sitting Balance Dynamic Sitting - Balance Support: Feet supported;During functional activity Dynamic Sitting - Level of Assistance: 4: Min assist Static Standing Balance Static Standing - Balance Support: Right upper extremity supported;During functional activity;Left upper extremity supported Static Standing - Level of Assistance: 4: Min assist Dynamic Standing Balance Dynamic Standing - Balance  Support: Right upper extremity supported;Left upper extremity supported Dynamic Standing - Level of Assistance: 4: Min assist Extremity/Trunk Assessment RUE Assessment RUE Assessment:  (strength not formally assessed secondary to back precautions.) LUE Assessment LUE Assessment: Within Functional Limits   See Function Navigator for Current Functional Status.   Refer to Care Plan for Long Term Goals  Recommendations for other services: None    Discharge Criteria: Patient will be discharged  from OT if patient refuses treatment 3 consecutive times without medical reason, if treatment goals not met, if there is a change in medical status, if patient makes no progress towards goals or if patient is discharged from hospital.  The above assessment, treatment plan, treatment alternatives and goals were discussed and mutually agreed upon: by patient  Diem Dicocco OTR/L 12/15/2016, 1:46 PM

## 2016-12-16 ENCOUNTER — Inpatient Hospital Stay (HOSPITAL_COMMUNITY): Payer: PPO | Admitting: Occupational Therapy

## 2016-12-16 ENCOUNTER — Inpatient Hospital Stay (HOSPITAL_COMMUNITY): Payer: PPO | Admitting: Speech Pathology

## 2016-12-16 ENCOUNTER — Inpatient Hospital Stay (HOSPITAL_COMMUNITY): Payer: PPO

## 2016-12-16 ENCOUNTER — Inpatient Hospital Stay (HOSPITAL_COMMUNITY): Payer: PPO | Admitting: *Deleted

## 2016-12-16 DIAGNOSIS — S32009A Unspecified fracture of unspecified lumbar vertebra, initial encounter for closed fracture: Secondary | ICD-10-CM

## 2016-12-16 DIAGNOSIS — G8918 Other acute postprocedural pain: Secondary | ICD-10-CM

## 2016-12-16 DIAGNOSIS — R5381 Other malaise: Secondary | ICD-10-CM

## 2016-12-16 DIAGNOSIS — Z720 Tobacco use: Secondary | ICD-10-CM

## 2016-12-16 DIAGNOSIS — F17200 Nicotine dependence, unspecified, uncomplicated: Secondary | ICD-10-CM

## 2016-12-16 MED ORDER — LIDOCAINE 5 % EX PTCH
1.0000 | MEDICATED_PATCH | CUTANEOUS | Status: DC
  Administered 2016-12-16 – 2016-12-20 (×5): 1 via TRANSDERMAL
  Filled 2016-12-16 (×5): qty 1

## 2016-12-16 MED ORDER — BUPROPION HCL ER (SR) 150 MG PO TB12
150.0000 mg | ORAL_TABLET | Freq: Every day | ORAL | Status: DC
  Administered 2016-12-16 – 2016-12-18 (×3): 150 mg via ORAL
  Filled 2016-12-16 (×3): qty 1

## 2016-12-16 NOTE — Progress Notes (Signed)
Occupational Therapy Session Note  Patient Details  Name: Derek [name] MRN: [id number] Date of Birth: [birth year]  Today's Date: 12/16/2016 OT Individual Time: 1300-1400 OT Individual Time Calculation (min): 60 min    Skilled Therapeutic Interventions/Progress Updates:    Pt seen for OT session focusing on ADL re-training and education with AE. Pt asleep sitting up in w/c upon arrival, easily awoken and with encouragement agreeable to tx session.  Pt voiced increased pain in B LEs, RN made aware though not time for pain meds. RN gave clearance to don TED hose for edema and pain management. Therapist donned TED hose total A. Education and demonstration provided for use of reacher and sock aid. With min cuing, pt able to return demonstrate and use each item in functional manner to complete task.  He then desired to ambulate throughout unit, completed with CGA and VCs to stay inside RW. Pt voiced fatigue, however, required encouragement to initiate seated rest break for safety. Two seated rst breaks required during ambulation throughout unit and for return to room. Pt left sitting up in recliner at end of session, all needs in reach and QRB donned.  Pt required VCs throughout session for alertness and attention to task, easily distracted by external stimuli or closing eyes throughout task.   Therapy Documentation Precautions:  Precautions Precautions: Back Precaution Booklet Issued: No Precaution Comments: pt unable to recall precautions Required Braces or Orthoses: Spinal Brace Spinal Brace: Thoracolumbosacral orthotic, Applied in supine position Restrictions Weight Bearing Restrictions: No  See Function Navigator for Current Functional Status.   Therapy/Group: Individual Therapy  Lewis, Chang Tiggs C 12/16/2016, 7:21 AM

## 2016-12-16 NOTE — Progress Notes (Signed)
Social Work Patient ID: Derek [name], male   DOB: 03-Dec-1945, 71 y.o.   MRN: [id number]  Met with pt and wife to discuss team conference goals mod/i-supervision level and target discharge 9/5. Wife is upset with MD Ordering Speech evaluation, she feels ti is the surgery, pain meds and his BP issues that is causing him to be like this. She feels it is him, she states; " It is [name] this is him he is like this after every surgery." She will be with him at home and is attending therapies with him here. She will ask MD again about his BP issues.

## 2016-12-16 NOTE — Evaluation (Signed)
Speech Language Pathology Assessment and Plan  Patient Details  Name: Derek [name] MRN: [id number] Date of Birth: 06/08/1945  SLP Diagnosis: Cognitive Impairments  Rehab Potential: Fair ELOS: 12/23/16    Today's Date: 12/16/2016 SLP Individual Time: 1655-3748 SLP Individual Time Calculation (min): 55 min   Problem List:  Patient Active Problem List   Diagnosis Date Noted  . Tobacco abuse   . Hypoalbuminemia due to protein-calorie malnutrition ([place])   . Vitamin B 12 deficiency   . Lethargic   . Other fracture of fourth lumbar vertebra, initial encounter for closed fracture ([place]) 12/14/2016  . Post-operative pain   . Benign essential HTN   . Acute blood loss anemia   . Lethargy   . B12 deficiency 12/12/2016  . Positive blood culture 12/10/2016  . Status post lumbar spinal fusion 12/10/2016  . Hyponatremia 12/08/2016  . Hypokalemia 12/08/2016  . Lactic acidosis 12/08/2016  . Normocytic anemia 12/08/2016  . Anxiety 12/08/2016  . COPD (chronic obstructive pulmonary disease) ([place]) 12/08/2016  . Back pain 12/08/2016  . Compression fracture of lumbar vertebra ([place]) 12/08/2016  . Thrombocytosis ([place]) 12/08/2016  . Sepsis ([place]) 12/06/2016  . Altered mental status 12/06/2016  . Lumbar stenosis with neurogenic claudication 09/23/2016  . Avascular necrosis of bone of right hip ([place]) 11/20/2015  . OA (osteoarthritis) of hip 11/20/2015  . Hepatomegaly 12/15/2011  . Adenomatous polyps 12/15/2011  . Atherosclerosis of native arteries of the extremities with intermittent claudication 09/15/2011  . Pain in limb 09/15/2011   Past Medical History:  Past Medical History:  Diagnosis Date  . Adenomatous polyps    remote past, on 5 year surveillance  . Arthritis   . Bell's palsy 40+yrs ago  . Cancer ([place])    skin  . COPD (chronic obstructive pulmonary disease) ([facility])    emphysema, pt. states he is not aware of this  . Diverticulitis   . Dyspnea    pt. denies  . Enlarged prostate    . Flat affect   . Headache(784.0)    having recently  . Hip pain 11/2016  . Hypertension   . Peripheral vascular disease ([place])   . Pneumonia   . Sepsis ([place]) 11/2016   Past Surgical History:  Past Surgical History:  Procedure Laterality Date  . BACK SURGERY  09/2016  . COLONOSCOPY  07/30/2005   Internal hemorrhoids; otherwise, normal rectum/ Left-sided diverticula  . COLONOSCOPY  01/11/2012   Procedure: COLONOSCOPY;  Surgeon: Daneil Dolin, MD;  [facility];  Service: Endoscopy;  Laterality: N/A;  1:15  . ILIAC ARTERY STENT  2006   Left CIA stenting  . KYPHOPLASTY N/A 12/09/2016   Procedure: Vertebroplasty;  Surgeon: Newman Pies, MD;  Location: [facility];  Service: Neurosurgery;  Laterality: N/A;  . ROTATOR CUFF REPAIR Right    3 different surgeries  . SINUS ENDO W/FUSION Left 08/18/2013   Procedure: ENDOSCOPIC SINUS SURGERY WITH FUSION NAVIGATION;  Surgeon: Melissa Montane, MD;  Location: [facility];  Service: ENT;  Laterality: Left;  . SINUS ENDO W/FUSION Left 12/21/2013   Procedure: ENDOSCOPIC SINUS SURGERY WITH FUSION NAVIGATION;  Surgeon: Melissa Montane, MD;  Location: [facility];  Service: ENT;  Laterality: Left;  ESS with fusion left frontal sinus with Rain Stent  . SPINE SURGERY     lumbar & cervical spine surgeries X 8   . TOTAL HIP ARTHROPLASTY Right 11/20/2015   Procedure: RIGHT TOTAL HIP ARTHROPLASTY ANTERIOR APPROACH;  Surgeon: Gaynelle Arabian, MD;  Location: [facility];  Service: Orthopedics;  Laterality: Right;  Marland Kitchen VASECTOMY      Assessment / Plan / Recommendation Clinical Impression Patient is a 71 y.o. 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 He was admitted on   12/06/16 with confusion, leucocytosis, recent back pain due to UTI and fall on the way to [facility] ED. He was found to have L3 Chance type fracture with 25% compression  fracture of L4 vertebral body.  Blood cultures X 2 done and he was started on IV antibiotics and transferred to [facility] for treatment.  MRI lumbar spine done 8/22 revealing L3 and L4 compression fractures, abnormal contrast and edema right psoas and posterior paraspinal fluid collection at L3-S1 levels. He underwent exploration of lumbar fusion with ORIF L3  And L4 Chance fractures with open Kyphoplasty and PLIF L2-L5 and redo arthrodesis L2-S1 by Dr. Arnoldo Morale on 8/22. 1/2 blood cultures positive for micrococcus and felt to be contaminant. Dr. Megan Salon consulted for input and recommended discontinuation of antibiotics. Leucocytosis resolved and post op ABLA noted.   He continues to have issues with lethargy needing max directional cues to follow commands/tasks. He continues to be  at high risk for falls due to poor safety with impairments in mobility as well as difficulty completing ADL tasks. CIR recommended for follow up therapy and patient admitted 12/14/16.  Patient demonstrates moderate-severe cognitive impairments characterized by decreased alertness, decreased sustained attention, decreased initiation, decreased recall of functional information, decreased intellectual awareness and decreased safety awareness which impacts his ability to complete functional and familiar tasks safely. Patient would benefit from skilled SLP intervention to maximize his cognitive function and overall functional independence prior to discharge.    Skilled Therapeutic Interventions          Administered a cognitive-linguistic evaluation. Please see above for details. Educated patient and wife on current cognitive function and goals of skilled SLP intervention. Both verbalized understanding and agreement.    SLP Assessment  Patient will need skilled [facility] during CIR admission    Recommendations  Oral Care Recommendations: Oral care BID Recommendations for Other Services: Neuropsych consult Patient  destination: Home Follow up Recommendations: 24 hour supervision/assistance (TBD) Equipment Recommended: None recommended by SLP    SLP Frequency 3 to 5 out of 7 days   SLP Duration  SLP Intensity  SLP Treatment/Interventions 12/23/16  Minumum of 1-2 x/day, 30 to 90 minutes  Cognitive remediation/compensation;Cueing hierarchy;Functional tasks;Patient/family education;Therapeutic Activities;Internal/external aids;Environmental controls    Pain Stomach pain, RN aware.    Function:  Cognition Comprehension Comprehension assist level: Understands basic 75 - 89% of the time/ requires cueing 10 - 24% of the time  Expression   Expression assist level: Expresses basic 25 - 49% of the time/requires cueing 50 - 75% of the time. Uses single words/gestures.  Social Interaction Social Interaction assist level: Interacts appropriately 25 - 49% of time - Needs frequent redirection.  Problem Solving Problem solving assist level: Solves basic 50 - 74% of the time/requires cueing 25 - 49% of the time  Memory Memory assist level: Recognizes or recalls 25 - 49% of the time/requires cueing 50 - 75% of the time   Short Term Goals: Week 1: SLP Short Term Goal 1 (Week 1): STGs=LTGs  Refer to Care Plan for Long Term Goals  Recommendations for other services: Neuropsych  Discharge Criteria: Patient  will be discharged from SLP if patient refuses treatment 3 consecutive times without medical reason, if treatment goals not met, if there is a change in medical status, if patient makes no progress towards goals or if patient is discharged from hospital.  The above assessment, treatment plan, treatment alternatives and goals were discussed and mutually agreed upon: by patient and by family  Kadasia Kassing 12/16/2016, 4:22 PM

## 2016-12-16 NOTE — Progress Notes (Signed)
[place] PHYSICAL MEDICINE & REHABILITATION     PROGRESS NOTE  Subjective/Complaints:  Pt seen this AM laying in bed.  Per wife, pt slept better overnight.  He is more alert this AM.   ROS: Denies CP, SOB, N/V/D.  Objective: Vital Signs: Blood pressure (!) 115/45, pulse 79, temperature 98.3 F (36.8 C), temperature source Oral, resp. rate 16, height 5\' 9"  (1.753 m), weight 86.7 kg (191 lb 3.2 oz), SpO2 95 %. No results found.  Recent Labs  12/15/16 0533  WBC 9.8  HGB 9.0*  HCT 28.9*  PLT 549*    Recent Labs  12/15/16 0533  NA 136  K 4.1  CL 97*  GLUCOSE 98  BUN 8  CREATININE 0.86  CALCIUM 8.5*   CBG (last 3)  No results for input(s): GLUCAP in the last 72 hours.  Wt Readings from Last 3 Encounters:  12/15/16 86.7 kg (191 lb 3.2 oz)  12/09/16 98.8 kg (217 lb 13 oz)  09/23/16 99.3 kg (219 lb)    Physical Exam:  BP (!) 115/45 (BP Location: Right Arm)   Pulse 79   Temp 98.3 F (36.8 C) (Oral)   Resp 16   Ht 5\' 9"  (1.753 m)   Wt 86.7 kg (191 lb 3.2 oz)   SpO2 95%   BMI 28.24 kg/m  Constitutional: He appears well-developed and well-nourished. He appears ill. No distress.  HENT: Normocephalic and atraumatic.  Eyes: EOM are normal. No discharge.  Cardiovascular: RRR.  No JVD. Respiratory: Effort normal and breath sounds normal.  GI: Soft. Bowel sounds are normal. Musculoskeletal: He exhibits no edema or tenderness.  Neurological: He is alert and oriented x2 Motor: 4/5 grossly throughout (stable).  Skin: Skin is warm and dry. He is not diaphoretic.  Back incision c/d/i  Psychiatric: His mood appears anxious. He expresses impulsivity. He is noncommunicative. He exhibits abnormal recent memory. He is inattentive.    Assessment/Plan: 1. Functional deficits secondary to lumbar chance fractures s/p PLIF and kyphoplasty which require 3+ hours per day of interdisciplinary therapy in a comprehensive inpatient rehab setting. Physiatrist is providing close team  supervision and 24 hour management of active medical problems listed below. Physiatrist and rehab team continue to assess barriers to discharge/monitor patient progress toward functional and medical goals.  Function:  Bathing Bathing position   Position: Sitting EOB  Bathing parts Body parts bathed by patient: Right arm, Left arm, Chest, Abdomen, Front perineal area, Right upper leg, Left upper leg Body parts bathed by helper: Right lower leg, Left lower leg, Back  Bathing assist        Upper Body Dressing/Undressing Upper body dressing   What is the patient wearing?: Pull over shirt/dress, Orthosis     Pull over shirt/dress - Perfomed by patient: Thread/unthread right sleeve, Thread/unthread left sleeve, Put head through opening, Pull shirt over trunk       Orthosis activity level: Performed by helper  Upper body assist        Lower Body Dressing/Undressing Lower body dressing   What is the patient wearing?: Non-skid slipper socks, Pants     Pants- Performed by patient: Pull pants up/down Pants- Performed by helper: Thread/unthread left pants leg, Thread/unthread right pants leg   Non-skid slipper socks- Performed by helper: Don/doff right sock, Don/doff left sock                  Lower body assist        Toileting Toileting   Toileting steps  completed by patient: Adjust clothing prior to toileting, Performs perineal hygiene, Adjust clothing after toileting      Toileting assist Assist level: Touching or steadying assistance (Pt.75%)   Transfers Chair/bed transfer   Chair/bed transfer method: Stand pivot   Chair/bed transfer assistive device: Armrests, Medical sales representative     Max distance: 55' Assist level: Touching or steadying assistance (Pt > 75%)   Wheelchair          Cognition Comprehension Comprehension assist level: Follows basic conversation/direction with no assist  Expression Expression assist level: Expresses basic 90%  of the time/requires cueing < 10% of the time.  Social Interaction Social Interaction assist level: Interacts appropriately 75 - 89% of the time - Needs redirection for appropriate language or to initiate interaction.  Problem Solving Problem solving assist level: Solves basic 75 - 89% of the time/requires cueing 10 - 24% of the time  Memory Memory assist level: Recognizes or recalls 50 - 74% of the time/requires cueing 25 - 49% of the time    Medical Problem List and Plan: 1.  Cognitive deficits, weakness, poor activity toleratnace secondary to lumbar chance fractures s/p PLIF and kyphoplasty.  Cont CIR 2.  DVT Prophylaxis/Anticoagulation: Mechanical: Sequential compression devices, below knee Bilateral lower extremities 3. Pain Management: Limit neurosedating meds--decreased oxycodone to 5 mg prn severe pain.  Resumed low dose Neurontin at nights to help with neuropathy and sleep.   Lidoderm patch ordered for back on 8/29 4. Mood: LCSW to follow for evaluation and support.  5. Neuropsych: This patient is not capable of making decisions on his own behalf. 6. Skin/Wound Care: Routine wound care.  7. Fluids/Electrolytes/Nutrition: Monitor I/Os. Monitor I/Os. Maintain adequate nutritional and hydration sources. Offer supplements between meals during the day and family encouraged to bring food from home 8. HTN: Monitor BP bid- continue lisinopril daily.   Controlled 8/29 9. ABLA: Post surgery. Will monitor for now.   Hb 9.0 on 8/28  Cont to monitor 10. Lethargy: Decreased Oxycodone to 5 mg and change robaxin to po prn.   11. Hypokalemia: Resolved with supplement--encourage intake   K+ 4.1 on 8/28  Cont to monitor 12. Vitamin B 12 deficiency:  Vitamin B12 - 104. He was started on B12 injections on 8/24--continue weekly X 4 doses.  13. Neuropathy: Was on Neurontin 600 mg tid at home but had sedating SE per wife. Resumed low dose at nights.  14. COPD: Currently is 2 PPD smoker but  asymptomatic/no issues in the past per wife.   Will order Bupropion 150 daily 15. Hypoalbuminemia  Supplement initiated 8/28  LOS (Days) 2 A FACE TO FACE EVALUATION WAS PERFORMED  Rudine Rieger Lorie Phenix 12/16/2016 8:09 AM

## 2016-12-16 NOTE — Patient Care Conference (Signed)
Inpatient RehabilitationTeam Conference and Plan of Care Update Date: 12/16/2016   Time: 2:30 PM    Patient Name: Derek [name]      Medical Record Number: [id number]  Date of Birth: [birth year] Sex: Male         Room/Bed: 4M02C/4M02C-01 Payor Info: Payor: [employer] / Plan: [employer] / Product Type: *No Product type* /    Admitting Diagnosis: lumbar fusion  Admit Date/Time:  12/14/2016  8:46 PM Admission Comments: No comment available   Primary Diagnosis:  <principal problem not specified> Principal Problem: <principal problem not specified>  Patient Active Problem List   Diagnosis Date Noted  . Tobacco abuse   . Hypoalbuminemia due to protein-calorie malnutrition ([place])   . Vitamin B 12 deficiency   . Lethargic   . Other fracture of fourth lumbar vertebra, initial encounter for closed fracture ([place]) 12/14/2016  . Post-operative pain   . Benign essential HTN   . Acute blood loss anemia   . Lethargy   . B12 deficiency 12/12/2016  . Positive blood culture 12/10/2016  . Status post lumbar spinal fusion 12/10/2016  . Hyponatremia 12/08/2016  . Hypokalemia 12/08/2016  . Lactic acidosis 12/08/2016  . Normocytic anemia 12/08/2016  . Anxiety 12/08/2016  . COPD (chronic obstructive pulmonary disease) ([place]) 12/08/2016  . Back pain 12/08/2016  . Compression fracture of lumbar vertebra ([place]) 12/08/2016  . Thrombocytosis ([place]) 12/08/2016  . Sepsis ([place]) 12/06/2016  . Altered mental status 12/06/2016  . Lumbar stenosis with neurogenic claudication 09/23/2016  . Avascular necrosis of bone of right hip ([place]) 11/20/2015  . OA (osteoarthritis) of hip 11/20/2015  . Hepatomegaly 12/15/2011  . Adenomatous polyps 12/15/2011  . Atherosclerosis of native arteries of the extremities with intermittent claudication 09/15/2011  . Pain in limb 09/15/2011    Expected Discharge Date: Expected Discharge Date: 12/23/16  Team Members Present: Physician leading conference: Dr.  Delice Lesch Social Worker Present: Ovidio Kin, LCSW Nurse Present: Leonette Nutting, RN PT Present: Michaelene Song, PT OT Present: Clyda Greener, OT SLP Present: Windell Moulding, SLP PPS Coordinator present : Daiva Nakayama, RN, CRRN     Current Status/Progress Goal Weekly Team Focus  Medical   Cognitive deficits, weakness, poor activity toleratnace secondary to lumbar chance fractures s/p PLIF and kyphoplasty.  Improve mobility, cognition, pain, comorbidities  See above   Bowel/Bladder   Continent  Maintain continence  Assist with toileting as needed.   Swallow/Nutrition/ Hydration             ADL's   Supervision for UB bathing and dressing except TLSO which requires max assist.  Mod assist for LB bathing and dressing secondary to precautions.  max assist for donning TLSO as well.  Decreased sustained attention   modified independent for toileting and toilet transfers, supervision for LB selfcare sit to stand.    selfcare retraining, cognitive retraining, balance retraining, adhering to precautions, DME and AE education, pt/family education   Mobility   mod assist for bed mobility and to don TLSO in supine, supine<>sit Mod A secondary to fatigue, min assist for sit<>stand and ambulation up to 100 ft  supervision for bed mobility, transfers and gait with RW  gait training with RW, activity tolerance, endurance, precuation education, HEP and d/c planning   Communication             Safety/Cognition/ Behavioral Observations            Pain   Back and thigh pain and Head aches.  <3.  Monitor and treat pain.   Skin   Surgical inscion to back well approximated.  For inscion to heal without complication.  Monitor site and change dressings as scheduled.      *See Care Plan and progress notes for long and short-term goals.     Barriers to Discharge  Current Status/Progress Possible Resolutions Date Resolved   Physician    Medical stability;Home environment access/layout;Wound Care;Other  (comments)  Pain, ?nicotine withdrawl  See above  Therapies, optimize pain meds, Bp meds, follow labs, meds for ?nicotine withdrawl.       Nursing                  PT                    OT                  SLP                SW                Discharge Planning/Teaching Needs:  HOme with wife who is here and attends therapies with pt. Been through this before, this is pt's 10th back surgery      Team Discussion:  Goals supervision level. Speech eval today. MD adjusting pain meds and BP and watching labs. Poor po intake. Pain is main issue and need to find medium for him to function. Wife here and feels this is the pt after every surgery. " It's Chin"  Revisions to Treatment Plan: DC 9/5    Continued Need for Acute Rehabilitation Level of Care: The patient requires daily medical management by a physician with specialized training in physical medicine and rehabilitation for the following conditions: Daily direction of a multidisciplinary physical rehabilitation program to ensure safe treatment while eliciting the highest outcome that is of practical value to the patient.: Yes Daily medical management of patient stability for increased activity during participation in an intensive rehabilitation regime.: Yes Daily analysis of laboratory values and/or radiology reports with any subsequent need for medication adjustment of medical intervention for : Post surgical problems;Wound care problems;Blood pressure problems;Pulmonary problems  [name] 12/16/2016, 4:03 PM

## 2016-12-16 NOTE — Progress Notes (Signed)
Occupational Therapy Session Note  Patient Details  Name: Derek [name] MRN: [id number] Date of Birth: 1946-03-11  Today's Date: 12/16/2016 OT Individual Time: 1105-1200 OT Individual Time Calculation (min): 55 min    Short Term Goals: Week 1:  OT Short Term Goal 1 (Week 1): STGs equal to LTGs based on ELOS.  Skilled Therapeutic Interventions/Progress Updates:    Pt in bedside chair to start session somewhat lethargic.  Did not want to shower, but reluctantly agreed to bathing from the bedside chair.  He needed max instructional cueing to initiate any bathing tasks, instead sitting with his eyes closed or attempting to scratch his lower back.  He was oriented to place but not day of month.  He stated today was Wednesday or Thursday after therapist asked him 3 times.  Max assist for doffing and donning brace sitting in the bedside chair in order to complete washing his UB and donning pullover shirt.  He needed mod assist for donning TLSO and fastening all straps.  Min assist for sit to stand to remove dirty clothing, complete peri hygiene, and pull new pants over hips.  He utilized LH sponge and reacher for washing and drying LEs as well as doffing and donning LB clothing with mod instructional cueing.  Pt left in bedside recliner at end of session with safety belt in place and call button in reach.    Therapy Documentation Precautions:  Precautions Precautions: Back Precaution Booklet Issued: No Precaution Comments: pt unable to recall precautions Required Braces or Orthoses: Spinal Brace Spinal Brace: Thoracolumbosacral orthotic, Applied in supine position Restrictions Weight Bearing Restrictions: No  Pain: Pain Assessment Pain Assessment: Faces Faces Pain Scale: Hurts little more Pain Type: Surgical pain Pain Location: Back Pain Orientation: Lower Pain Descriptors / Indicators: Discomfort Pain Onset: With Activity Pain Intervention(s): Repositioned;Emotional support ADL: See  Function Navigator for Current Functional Status.   Therapy/Group: Individual Therapy  Lan Mcneill OTR/L 12/16/2016, 12:23 PM

## 2016-12-16 NOTE — Progress Notes (Signed)
Physical Therapy Session Note  Patient Details  Name: Derek [name] MRN: [id number] Date of Birth: 12/15/1945  Today's Date: 12/16/2016 PT Individual Time: 6387-5643, 1530- 1627 PT Individual Time Calculation (min): 38 min, 57 min  Short Term Goals: Week 1:  PT Short Term Goal 1 (Week 1): STG=LTG due to ELOS  Skilled Therapeutic Interventions/Progress Updates:    Session 1: Pt supine in bed upon PT arrival, agreeable to therapy tx and reports pain 7/10 at rest in low back. Pt performed rolling in each direction x 2 with min assist in order to don TLSO, total assist. Pt performed supine to sitting with min assist, verbal and tactile cues to maintain back precautions. Pt transferred sit<>stand with min assist and ambulated 2 x 120 ft using RW and min assist, verbal cues for posture. Pt seated EOB performed therex 1 x 10 LAQ, 1 x10 hip flexion and x 20 ankle pumps. Pt standing with RW performed 2 x 10 marches with min assist. Pt transferred from EOB to recliner stand step using RW and min assist. Pt left seated in recliner with QRB in place and needs in reach.   Session 2: Pt sitting in recliner upon PT arrival, agreeable to therapy tx and reports pain 6/10. Pt transferred from recliner to w/cusing RW and min assist, stand pivot transfer. Pt transported in w/c to the gym. Pt transferred from w/c to nustep using RW and stand step with min assist. Pt used nustep x8 minutes on workload 3 for LE strengthening/ROM and endurance. Pt worked on standing dynamic balance, awareness and attention in order to complete peg board puzzle in standing without UE support. Pt ambulated x 200 ft back to room using RW and min assist with verbal cues for upright posture, increased step length and RW technique. Pt performed sit<>supine with mod assist and rolling each direction x 2 with min assist in order to doff TLSO total assist. Pt left supine in bed with needs in reach.   Therapy Documentation Precautions:   Precautions Precautions: Back Precaution Booklet Issued: No Precaution Comments: pt unable to recall precautions Required Braces or Orthoses: Spinal Brace Spinal Brace: Thoracolumbosacral orthotic, Applied in supine position Restrictions Weight Bearing Restrictions: No  See Function Navigator for Current Functional Status.   Therapy/Group: Individual Therapy  Netta Corrigan, PT, DPT 12/16/2016, 10:17 AM

## 2016-12-16 NOTE — Evaluation (Signed)
Recreational Therapy Assessment and Plan  Patient Details  Name: Derek [name] MRN: [id number] Date of Birth: 1945/11/28 Today's Date: 12/16/2016  Rehab Potential:  Good   ELOS:   discharge 9/5  Assessment  Problem List:      Patient Active Problem List   Diagnosis Date Noted  . Hypoalbuminemia due to protein-calorie malnutrition ([place])   . Vitamin B 12 deficiency   . Lethargic   . Other fracture of fourth lumbar vertebra, initial encounter for closed fracture ([place]) 12/14/2016  . Post-operative pain   . Benign essential HTN   . Acute blood loss anemia   . Lethargy   . B12 deficiency 12/12/2016  . Positive blood culture 12/10/2016  . Status post lumbar spinal fusion 12/10/2016  . Hyponatremia 12/08/2016  . Hypokalemia 12/08/2016  . Lactic acidosis 12/08/2016  . Normocytic anemia 12/08/2016  . Anxiety 12/08/2016  . COPD (chronic obstructive pulmonary disease) ([place]) 12/08/2016  . Back pain 12/08/2016  . Compression fracture of lumbar vertebra ([place]) 12/08/2016  . Thrombocytosis ([place]) 12/08/2016  . Sepsis ([place]) 12/06/2016  . Altered mental status 12/06/2016  . Lumbar stenosis with neurogenic claudication 09/23/2016  . Avascular necrosis of bone of right hip ([place]) 11/20/2015  . OA (osteoarthritis) of hip 11/20/2015  . Hepatomegaly 12/15/2011  . Adenomatous polyps 12/15/2011  . Atherosclerosis of native arteries of the extremities with intermittent claudication 09/15/2011  . Pain in limb 09/15/2011    Past Medical History:      Past Medical History:  Diagnosis Date  . Adenomatous polyps    remote past, on 5 year surveillance  . Arthritis   . Bell's palsy 40+yrs ago  . Cancer ([place])    skin  . COPD (chronic obstructive pulmonary disease) ([facility])    emphysema, pt. states he is not aware of this  . Diverticulitis   . Dyspnea    pt. denies  . Enlarged prostate   . Flat affect   . Headache(784.0)    having recently  . Hip pain 11/2016  .  Hypertension   . Peripheral vascular disease ([place])   . Pneumonia   . Sepsis ([place]) 11/2016   Past Surgical History:       Past Surgical History:  Procedure Laterality Date  . BACK SURGERY  09/2016  . COLONOSCOPY  07/30/2005   Internal hemorrhoids; otherwise, normal rectum/ Left-sided diverticula  . COLONOSCOPY  01/11/2012   Procedure: COLONOSCOPY;  Surgeon: Daneil Dolin, MD;  [facility];  Service: Endoscopy;  Laterality: N/A;  1:15  . ILIAC ARTERY STENT  2006   Left CIA stenting  . KYPHOPLASTY N/A 12/09/2016   Procedure: Vertebroplasty;  Surgeon: Newman Pies, MD;  Location: [place];  Service: Neurosurgery;  Laterality: N/A;  . ROTATOR CUFF REPAIR Right    3 different surgeries  . SINUS ENDO W/FUSION Left 08/18/2013   Procedure: ENDOSCOPIC SINUS SURGERY WITH FUSION NAVIGATION;  Surgeon: Melissa Montane, MD;  Location: [place];  Service: ENT;  Laterality: Left;  . SINUS ENDO W/FUSION Left 12/21/2013   Procedure: ENDOSCOPIC SINUS SURGERY WITH FUSION NAVIGATION;  Surgeon: Melissa Montane, MD;  Location: [place];  Service: ENT;  Laterality: Left;  ESS with fusion left frontal sinus with Rain Stent  . SPINE SURGERY     lumbar & cervical spine surgeries X 8   . TOTAL HIP ARTHROPLASTY Right 11/20/2015   Procedure: RIGHT TOTAL HIP ARTHROPLASTY ANTERIOR APPROACH;  Surgeon: Gaynelle Arabian, MD;  Location: [facility];  Service: Orthopedics;  Laterality:  Right;  Marland Kitchen VASECTOMY      Assessment & Plan Clinical Impression: Patient is a 71 y.o. year old male with recent admission to the hospital on 12/06/16 with confusion, leucocytosis, recent back pain due to UTI and fall [facility] ED. He was found to have L3 Chance type fracture with 25% compression fracture of L4 vertebral body. Blood cultures X 2 done and he was started on IV antibiotics and transferred to [facility] for treatment. MRI lumbar spine done 8/22 revealing L3 and L4 compression fractures, abnormal contrast and edema right  psoas and posterior paraspinal fluid collection at L3-S1 levels. He underwent exploration of lumbar fusion with ORIF L3 And L4 Chance fractures with open Kyphoplasty and PLIF L2-L5 and redo arthrodesis L2-S1 by Dr. Arnoldo Morale on 8/22. 1/2 blood cultures positive for micrococcus and felt to be contaminant. Dr. Megan Salon consulted for input and recommended discontinuation of antibiotics. Leucocytosis resolved and post op ABLA noted. Patient transferred to CIR on 12/14/2016.    Pt presents with decreased activity tolerance, decreased functional mobility, decreased balance, decreased attention, decreased awareness, decreased memory and delayed processing difficulty maintaining precautions.    Met with pt and wife today to discuss TR services & previous leisure interests/community pursuits.  Pt participated in discussion, but wife attempting to answer questions.  Both report that pt had returned to driving and walking at [facility] prior to this surgery and that he hoped he could return to that soon.  Wife reports that pt is typically groggy and out of it after surgery and feels that pt's participation in evaluation is "his normal"    Leisure History/Participation Premorbid leisure interest/current participation: [place] care;Petra Kuba - [employer] Other Leisure Interests: Television;Computer Scientist, research (life sciences) on tablet) Leisure Participation Style: Alone;With Family/Friends Awareness of Community Resources: Fair-identify 2 post discharge leisure resources Psychosocial / Spiritual Patient agreeable to Pet Therapy: Yes Social interaction - Mood/Behavior: Cooperative Academic librarian Appropriate for Education?: Yes Recreational Therapy Orientation Orientation -Reviewed with patient: Available activity resources Strengths/Weaknesses Patient weaknesses: Physical limitations TR Patient demonstrates impairments in the following area(s): Edema;Endurance;Motor;Pain;Safety  Plan No  further TR as pt & wife with limited interest.  Will offer animal assisted activities throughout LOS  Recommendations for other services: None   Discharge Criteria: Patient will be discharged from TR if patient refuses treatment 3 consecutive times without medical reason.  If treatment goals not met, if there is a change in medical status, if patient makes no progress towards goals or if patient is discharged from hospital.  The above assessment, treatment plan, treatment alternatives and goals were discussed and mutually agreed upon: by patient  [place] 12/16/2016, 3:50 PM

## 2016-12-17 ENCOUNTER — Inpatient Hospital Stay (HOSPITAL_COMMUNITY): Payer: PPO | Admitting: Speech Pathology

## 2016-12-17 ENCOUNTER — Inpatient Hospital Stay (HOSPITAL_COMMUNITY): Payer: PPO | Admitting: Physical Therapy

## 2016-12-17 ENCOUNTER — Inpatient Hospital Stay (HOSPITAL_COMMUNITY): Payer: PPO | Admitting: Occupational Therapy

## 2016-12-17 DIAGNOSIS — F17203 Nicotine dependence unspecified, with withdrawal: Secondary | ICD-10-CM

## 2016-12-17 DIAGNOSIS — Z981 Arthrodesis status: Secondary | ICD-10-CM

## 2016-12-17 MED ORDER — NICOTINE 21 MG/24HR TD PT24
21.0000 mg | MEDICATED_PATCH | Freq: Every day | TRANSDERMAL | Status: DC
  Administered 2016-12-17 – 2016-12-20 (×4): 21 mg via TRANSDERMAL
  Filled 2016-12-17 (×5): qty 1

## 2016-12-17 MED ORDER — HYDROCODONE-ACETAMINOPHEN 7.5-325 MG PO TABS
1.0000 | ORAL_TABLET | ORAL | Status: DC | PRN
  Administered 2016-12-17 – 2016-12-19 (×3): 1 via ORAL
  Filled 2016-12-17 (×3): qty 1

## 2016-12-17 NOTE — Progress Notes (Signed)
Speech Language Pathology Daily Session Note  Patient Details  Name: Derek [name] MRN: [id number] Date of Birth: [birth year]  Today's Date: 12/17/2016 SLP Individual Time: 8882-8003 SLP Individual Time Calculation (min): 26 min  Short Term Goals: Week 1: SLP Short Term Goal 1 (Week 1): STGs=LTGs  Skilled Therapeutic Interventions:  Pt was seen for skilled ST targeting cognitive goals.  Pt sustained his attention to a simple snack for ~30 seconds before requiring max cues for redirection.  Pt also needed max to total assist for recall of even general daily information.  Pt reported the year was 2023 and was oriented to month only.  As a result, therapist provided pt with a calendar to facilitate improved orientation to time.  Pt needed max assist verbal and visual cues for use of environmental aids to recall safety precautions such as using the call bell before getting up.  Pt was left in recliner with wife at bedside and call bell within reach.  Continue per current plan of care.      Function:  Eating Eating   Modified Consistency Diet: No Eating Assist Level: Supervision or verbal cues           Cognition Comprehension Comprehension assist level: Understands basic 90% of the time/cues < 10% of the time  Expression   Expression assist level: Expresses basic 50 - 74% of the time/requires cueing 25 - 49% of the time. Needs to repeat parts of sentences.  Social Interaction Social Interaction assist level: Interacts appropriately 25 - 49% of time - Needs frequent redirection.  Problem Solving Problem solving assist level: Solves basic 25 - 49% of the time - needs direction more than half the time to initiate, plan or complete simple activities  Memory Memory assist level: Recognizes or recalls less than 25% of the time/requires cueing greater than 75% of the time    Pain Pain Assessment Pain Assessment: 0-10 Pain Score: 7  Pain Location: Leg Pain Orientation: Right Pain  Descriptors / Indicators: Aching Pain Intervention(s): Other (Comment) (premedicated)  Therapy/Group: Individual Therapy  Jahzier Villalon, Selinda Orion 12/17/2016, 9:07 AM

## 2016-12-17 NOTE — Progress Notes (Signed)
Occupational Therapy Session Note  Patient Details  Name: Derek [name] MRN: [id number] Date of Birth: [birth year]  Today's Date: 12/17/2016 OT Individual Time: 0902-1004 OT Individual Time Calculation (min): 62 min    Short Term Goals: Week 1:  OT Short Term Goal 1 (Week 1): STGs equal to LTGs based on ELOS.  Skilled Therapeutic Interventions/Progress Updates:    Pt ambulated to the shower with min guard assist using the RW.  He completed undressing with min guard assist as well sit to stand, with use of the reacher for removing LB clothing secondary to back precautions.  Bathing with supervision sitting on shower bench.  Did not attempt standing secondary to pt not having back brace on.  Utilized LH sponge for washing lower legs and feet with overall mod instructional cueing to sequence bathing for thoroughness.  He completed UB dressing sitting on the shower bench, including donning TLSO which required max assist.  He then ambulated out to the bedside recliner to complete LB dressing.  He was able to use the reacher and the sockaide to donn pull up pants and gripper socks.  Therapist assisted with TEDs.  Pt still with decreased sustained attention and with dressing he attempted to donn both socks on the right foot until cued of mistake.  Pt's spouse present during entire session.  He was left in the bedside chair with safety belt in place and call button in reach.   Therapy Documentation Precautions:  Precautions Precautions: Back Precaution Booklet Issued: No Precaution Comments: pt unable to recall precautions Required Braces or Orthoses: Spinal Brace Spinal Brace: Thoracolumbosacral orthotic, Applied in supine position Restrictions Weight Bearing Restrictions: No  Pain: Pain Assessment Pain Assessment: Faces Pain Score: 7  Faces Pain Scale: Hurts a little bit Pain Type: Surgical pain Pain Location: Back Pain Orientation: Right Pain Descriptors / Indicators: Discomfort Pain  Onset: With Activity Pain Intervention(s): Repositioned ADL: See Function Navigator for Current Functional Status.   Therapy/Group: Individual Therapy  Vantasia Pinkney OTR/L 12/17/2016, 11:18 AM

## 2016-12-17 NOTE — Progress Notes (Signed)
Physical Therapy Session Note  Patient Details  Name: Derek [name] MRN: [id number] Date of Birth: 12/20/1945  Today's Date: 12/17/2016 PT Individual Time: 1005-1100 AND 1305-1345 PT Individual Time Calculation (min): 55 min AND 40 min  Short Term Goals: Week 1:  PT Short Term Goal 1 (Week 1): STG=LTG due to ELOS  Skilled Therapeutic Interventions/Progress Updates:   Session 1:  Pt in recliner upon arrival and agreeable to therapy, pain 5/10 as detailed below. Worked on activity tolerance and endurance this session, pt ambulated to/from therapy gym w/ Min guard to close supervision using RW, ~200' each way. Worked on standing tolerance while performing unilateral UE task w/ unilateral UE support on RW. Pt able to stand 10+ minutes w/ supervision. Pt requesting to go to bathroom, so he ambulated to/from toilet w/ RW and performed multiple sit<>stands at toilet while performing pericare w/ Min A. Pt returned to recliner and was instructed on LE exercises he can do w/ wife present outside of therapy, including: heel raises, toe raises, standing knee marches, and standing hip abduction and extension. Pt performed exercises correctly, within pain tolerance, and w/ supervision while standing w/ bilateral UE support on RW. He is safe to perform exercises outside of therapy w/ wife present to supervise sit<>stand w/ RW. Issued handout of exercises and ended session in recliner, in care of wife and all needs met.   Session 2:  Pt in recliner upon arrival and agreeable to therapy, no c/o pain. Worked on activity tolerance and endurance this session. Ambulated to/from day room, 300' each way, w/ close supervision. NuStep @ L2 for 15 min w/ mild increase in fatigue. Returned to room and transferred to supine from EOB and assisted pt in taking off TLSO w/ Total A. Ended session supine in bed, call bell within reach and all needs met.   Therapy Documentation Precautions:  Precautions Precautions:  Back Precaution Booklet Issued: No Precaution Comments: pt unable to recall precautions Required Braces or Orthoses: Spinal Brace Spinal Brace: Thoracolumbosacral orthotic, Applied in supine position Restrictions Weight Bearing Restrictions: No Pain: Pain Assessment Pain Assessment: Faces Faces Pain Scale: Hurts a little bit Pain Type: Surgical pain Pain Location: Back Pain Descriptors / Indicators: Discomfort Pain Onset: With Activity Pain Intervention(s): Repositioned  See Function Navigator for Current Functional Status.   Therapy/Group: Individual Therapy   K Arnette 12/17/2016, 1:19 PM  

## 2016-12-17 NOTE — Progress Notes (Signed)
Derek [name]     PROGRESS NOTE  Subjective/Complaints:  Pt seen sitting up in his chair this AM.  He slept well overnight.  He believes the pain patch has helped.  He is more alert this AM.   ROS: Denies CP, SOB, N/V/D.  Objective: Vital Signs: Blood pressure (!) 116/55, pulse 83, temperature 97.8 F (36.6 C), temperature source Oral, resp. rate 17, height 5\' 9"  (1.753 m), weight 86.7 kg (191 lb 3.2 oz), SpO2 93 %. No results found.  Recent Labs  12/15/16 0533  WBC 9.8  HGB 9.0*  HCT 28.9*  PLT 549*    Recent Labs  12/15/16 0533  NA 136  K 4.1  CL 97*  GLUCOSE 98  BUN 8  CREATININE 0.86  CALCIUM 8.5*   CBG (last 3)  No results for input(s): GLUCAP in the last 72 hours.  Wt Readings from Last 3 Encounters:  12/15/16 86.7 kg (191 lb 3.2 oz)  12/09/16 98.8 kg (217 lb 13 oz)  09/23/16 99.3 kg (219 lb)    Physical Exam:  BP (!) 116/55 (BP Location: Right Arm)   Pulse 83   Temp 97.8 F (36.6 C) (Oral)   Resp 17   Ht 5\' 9"  (1.753 m)   Wt 86.7 kg (191 lb 3.2 oz)   SpO2 93%   BMI 28.24 kg/m  Constitutional: He appears well-developed and well-nourished. He appears ill. No distress.  HENT: Normocephalic and atraumatic.  Eyes: EOM are normal. No discharge.  Cardiovascular: RRR.  No JVD. Respiratory: Effort normal and breath sounds normal.  GI: Soft. Bowel sounds are normal. Musculoskeletal: He exhibits no edema or tenderness.  Neurological: He is alert and oriented, except for date of month. Quicker response times.  Motor: 4/5 grossly throughout (stable).  Skin: Skin is warm and dry. He is not diaphoretic.  Back incision c/d/i  Psychiatric: His mood appears anxious. He expresses impulsivity. He is noncommunicative. He exhibits abnormal recent memory. He is inattentive.    Assessment/Plan: 1. Functional deficits secondary to lumbar chance fractures s/p PLIF and kyphoplasty which require 3+ hours per day of interdisciplinary  therapy in a comprehensive inpatient rehab setting. Physiatrist is providing close team supervision and 24 hour management of active medical problems listed below. Physiatrist and rehab team continue to assess barriers to discharge/monitor patient progress toward functional and medical goals.  Function:  Bathing Bathing position   Position:  (bedside chair)  Bathing parts Body parts bathed by patient: Right arm, Left arm, Chest, Abdomen, Front perineal area, Right upper leg, Left upper leg, Right lower leg, Left lower leg, Buttocks Body parts bathed by helper: Back  Bathing assist        Upper Body Dressing/Undressing Upper body dressing   What is the patient wearing?: Pull over shirt/dress, Orthosis     Pull over shirt/dress - Perfomed by patient: Thread/unthread right sleeve, Thread/unthread left sleeve, Put head through opening, Pull shirt over trunk       Orthosis activity level: Performed by helper  Upper body assist        Lower Body Dressing/Undressing Lower body dressing   What is the patient wearing?: Non-skid slipper socks, Pants     Pants- Performed by patient: Thread/unthread right pants leg, Thread/unthread left pants leg, Pull pants up/down Pants- Performed by helper: Thread/unthread left pants leg, Thread/unthread right pants leg Non-skid slipper socks- Performed by patient: Don/doff left sock, Don/doff right sock Non-skid slipper socks- Performed by helper: Don/doff right sock,  Don/doff left sock                  Lower body assist Assist for lower body dressing: Touching or steadying assistance (Pt > 75%)      Toileting Toileting   Toileting steps completed by patient: Adjust clothing prior to toileting, Performs perineal hygiene, Adjust clothing after toileting      Toileting assist Assist level: Touching or steadying assistance (Pt.75%)   Transfers Chair/bed transfer   Chair/bed transfer method: Stand pivot Chair/bed transfer assist level:  Touching or steadying assistance (Pt > 75%) Chair/bed transfer assistive device: Armrests, Medical sales representative     Max distance: 200 ft Assist level: Touching or steadying assistance (Pt > 75%)   Wheelchair          Cognition Comprehension Comprehension assist level: Understands basic 75 - 89% of the time/ requires cueing 10 - 24% of the time  Expression Expression assist level: Expresses basic 25 - 49% of the time/requires cueing 50 - 75% of the time. Uses single words/gestures.  Social Interaction Social Interaction assist level: Interacts appropriately 25 - 49% of time - Needs frequent redirection.  Problem Solving Problem solving assist level: Solves basic 50 - 74% of the time/requires cueing 25 - 49% of the time  Memory Memory assist level: Recognizes or recalls 25 - 49% of the time/requires cueing 50 - 75% of the time    Medical Problem List and Plan: 1.  Cognitive deficits, weakness, poor activity toleratnace secondary to lumbar chance fractures s/p PLIF and kyphoplasty.  Cont CIR 2.  DVT Prophylaxis/Anticoagulation: Mechanical: Sequential compression devices, below knee Bilateral lower extremities 3. Pain Management: Limit neurosedating meds--decreased oxycodone to 5 mg prn severe pain.  Resumed low dose Neurontin at nights to help with neuropathy and sleep.   Lidoderm patch ordered for back on 8/29 4. Mood: LCSW to follow for evaluation and support.  5. Neuropsych: This patient is not capable of making decisions on his own behalf. 6. Skin/Wound Care: Routine wound care.  7. Fluids/Electrolytes/Nutrition: Monitor I/Os. Monitor I/Os. Maintain adequate nutritional and hydration sources. Offer supplements between meals during the day and family encouraged to bring food from home 8. HTN: Monitor BP bid- continue lisinopril daily.   Controlled 8/30 9. ABLA: Post surgery. Will monitor for now.   Hb 9.0 on 8/28  Cont to monitor 10. Lethargy: Decreased Oxycodone to 5  mg and change robaxin to po prn.   11. Hypokalemia: Resolved with supplement--encourage intake   K+ 4.1 on 8/28  Cont to monitor 12. Vitamin B 12 deficiency:  Vitamin B12 - 104. He was started on B12 injections on 8/24--continue weekly X 4 doses.  13. Neuropathy: Was on Neurontin 600 mg tid at home but had sedating SE per wife. Resumed low dose at nights.  14. COPD: Currently is 2 PPD smoker but asymptomatic/no issues in the past per wife.   Bupropion 150 daily started 8/29 due to nicotine withdrawal signs 15. Hypoalbuminemia  Supplement initiated 8/28  LOS (Days) 3 A FACE TO FACE EVALUATION WAS PERFORMED  Derek [name] Derek [name] 12/17/2016 8:00 AM

## 2016-12-18 ENCOUNTER — Inpatient Hospital Stay (HOSPITAL_COMMUNITY): Payer: PPO | Admitting: Occupational Therapy

## 2016-12-18 ENCOUNTER — Inpatient Hospital Stay (HOSPITAL_COMMUNITY): Payer: PPO | Admitting: Speech Pathology

## 2016-12-18 ENCOUNTER — Inpatient Hospital Stay (HOSPITAL_COMMUNITY): Payer: PPO | Admitting: Physical Therapy

## 2016-12-18 MED ORDER — LISINOPRIL 5 MG PO TABS
5.0000 mg | ORAL_TABLET | Freq: Every day | ORAL | Status: DC
Start: 2016-12-18 — End: 2016-12-21
  Administered 2016-12-19 – 2016-12-21 (×3): 5 mg via ORAL
  Filled 2016-12-18 (×3): qty 1

## 2016-12-18 NOTE — Progress Notes (Signed)
B/p low this am, b/p medication held this am.  PA notified with new orders. Pt attended therapies, alert and oriented, able to make needs known, pt noted with flat affect and temperamental at times, walked up and down hall way to get out of room.  Compliant with back brace with little reminders.  Scheduled tramadol administered with some relief. No PRN medications administered today with effective redirections. Pt resting in bed comfortably with SR up x 3, call light in reach, wife at bedside.

## 2016-12-18 NOTE — Progress Notes (Signed)
Occupational Therapy Session Note  Patient Details  Name: Derek [name] MRN: [id number] Date of Birth: 1946-02-04  Today's Date: 12/18/2016 OT Individual Time: 9323-5573 OT Individual Time Calculation (min): 43 min    Short Term Goals: Week 1:  OT Short Term Goal 1 (Week 1): STGs equal to LTGs based on ELOS.  Skilled Therapeutic Interventions/Progress Updates:    Pt's spouse present for session.  Worked on family education in preparation for pt discharge.  Spouse was able to assist him with transfer into the bathroom for use of elevated toilet with close supervision.  She was able to transfer him to the bed and transition to supine with min assist in order to lift the RLE into the bed.  She removed the TLSO in supine with pt rolling to the right side and then re-donned it in the same position with pt transitioning to supine for adjustment and fastening velcro straps.  Once finished, he was able to transfer to the EOB and then ambulate down to the tub/shower room where he practiced tub transfers with min assist for lifting the RLE over the edge of the tub.  He was able to remove them both with supervision when getting out of the tub.  Finished session with functional mobility to the day room and then back to the room.  One seated rest break needed for 2 mins to complete mobility.  Pt with increased trunk flexion with mobility.  Elevated walker one setting to help decrease this as much as possible.  Discussed progress with pt and spouse as well as other team members.  Feel he will be ready for discharge on 9/2.     Therapy Documentation Precautions:  Precautions Precautions: Back Precaution Booklet Issued: No Precaution Comments: pt able to recall 1/3 precautions Required Braces or Orthoses: Spinal Brace Spinal Brace: Thoracolumbosacral orthotic, Applied in supine position Restrictions Weight Bearing Restrictions: No  Pain: Pain Assessment Pain Assessment: Faces Faces Pain Scale: Hurts a  little bit Pain Type: Surgical pain Pain Location: Back Pain Descriptors / Indicators: Discomfort Pain Onset: With Activity Pain Intervention(s): Repositioned ADL: See Function Navigator for Current Functional Status.   Therapy/Group: Individual Therapy  Rickardo Brinegar OTR/L 12/18/2016, 3:51 PM

## 2016-12-18 NOTE — Progress Notes (Signed)
[place] PHYSICAL MEDICINE & REHABILITATION     PROGRESS NOTE  Subjective/Complaints:  Pt seen laying in bed this AM.  He slept well overnight.  Wife at bedside.   ROS: Denies CP, SOB, N/V/D.  Objective: Vital Signs: Blood pressure (!) 97/40, pulse 86, temperature 98.4 F (36.9 C), temperature source Oral, resp. rate 17, height 5\' 9"  (1.753 m), weight 86.7 kg (191 lb 3.2 oz), SpO2 91 %. No results found. No results for input(s): WBC, HGB, HCT, PLT in the last 72 hours. No results for input(s): NA, K, CL, GLUCOSE, BUN, CREATININE, CALCIUM in the last 72 hours.  Invalid input(s): CO CBG (last 3)  No results for input(s): GLUCAP in the last 72 hours.  Wt Readings from Last 3 Encounters:  12/15/16 86.7 kg (191 lb 3.2 oz)  12/09/16 98.8 kg (217 lb 13 oz)  09/23/16 99.3 kg (219 lb)    Physical Exam:  BP (!) 97/40 (BP Location: Left Arm)   Pulse 86   Temp 98.4 F (36.9 C) (Oral)   Resp 17   Ht 5\' 9"  (1.753 m)   Wt 86.7 kg (191 lb 3.2 oz)   SpO2 91%   BMI 28.24 kg/m  Constitutional: He appears well-developed and well-nourished. He appears ill. No distress.  HENT: Normocephalic and atraumatic.  Eyes: EOM are normal. No discharge.  Cardiovascular: RRR.  No JVD. Respiratory: Effort normal and breath sounds normal.  GI: Soft. Bowel sounds are normal. Musculoskeletal: He exhibits no edema or tenderness.  Neurological: He is alert and oriented x2 Motor: 4/5 grossly throughout (improving).  Skin: Skin is warm and dry. He is not diaphoretic.  Back incision c/d/i  Psychiatric: His mood appears anxious. He expresses impulsivity.   Assessment/Plan: 1. Functional deficits secondary to lumbar chance fractures s/p PLIF and kyphoplasty which require 3+ hours per day of interdisciplinary therapy in a comprehensive inpatient rehab setting. Physiatrist is providing close team supervision and 24 hour management of active medical problems listed below. Physiatrist and rehab team continue  to assess barriers to discharge/monitor patient progress toward functional and medical goals.  Function:  Bathing Bathing position   Position: Shower  Bathing parts Body parts bathed by patient: Right arm, Left arm, Chest, Abdomen, Front perineal area, Right upper leg, Left upper leg, Right lower leg, Left lower leg, Buttocks Body parts bathed by helper: Back  Bathing assist        Upper Body Dressing/Undressing Upper body dressing   What is the patient wearing?: Pull over shirt/dress, Orthosis     Pull over shirt/dress - Perfomed by patient: Thread/unthread right sleeve, Thread/unthread left sleeve, Put head through opening, Pull shirt over trunk       Orthosis activity level: Performed by helper  Upper body assist        Lower Body Dressing/Undressing Lower body dressing   What is the patient wearing?: Non-skid slipper socks, Pants     Pants- Performed by patient: Thread/unthread right pants leg, Thread/unthread left pants leg, Pull pants up/down Pants- Performed by helper: Thread/unthread left pants leg, Thread/unthread right pants leg Non-skid slipper socks- Performed by patient: Don/doff left sock, Don/doff right sock Non-skid slipper socks- Performed by helper: Don/doff right sock, Don/doff left sock             TED Hose - Performed by patient: Don/doff right TED hose, Don/doff left TED hose    Lower body assist Assist for lower body dressing: Touching or steadying assistance (Pt > 75%)  Toileting Toileting   Toileting steps completed by patient: Adjust clothing prior to toileting, Performs perineal hygiene, Adjust clothing after toileting      Toileting assist Assist level: Touching or steadying assistance (Pt.75%)   Transfers Chair/bed transfer   Chair/bed transfer method: Ambulatory Chair/bed transfer assist level: Supervision or verbal cues Chair/bed transfer assistive device: Armrests, Medical sales representative     Max distance:  300' Assist level: Supervision or verbal cues   Wheelchair          Cognition Comprehension Comprehension assist level: Understands basic 75 - 89% of the time/ requires cueing 10 - 24% of the time  Expression Expression assist level: Expresses basic 75 - 89% of the time/requires cueing 10 - 24% of the time. Needs helper to occlude trach/needs to repeat words.  Social Interaction Social Interaction assist level: Interacts appropriately 75 - 89% of the time - Needs redirection for appropriate language or to initiate interaction.  Problem Solving Problem solving assist level: Solves basic 50 - 74% of the time/requires cueing 25 - 49% of the time  Memory Memory assist level: Recognizes or recalls 75 - 89% of the time/requires cueing 10 - 24% of the time    Medical Problem List and Plan: 1.  Cognitive deficits, weakness, poor activity toleratnace secondary to lumbar chance fractures s/p PLIF and kyphoplasty.  Cont CIR 2.  DVT Prophylaxis/Anticoagulation: Mechanical: Sequential compression devices, below knee Bilateral lower extremities 3. Pain Management: Limit neurosedating meds--decreased oxycodone to 5 mg prn severe pain.  Resumed low dose Neurontin at nights to help with neuropathy and sleep.   Lidoderm patch ordered for back on 8/29  Improving 4. Mood: LCSW to follow for evaluation and support.  5. Neuropsych: This patient is not capable of making decisions on his own behalf. 6. Skin/Wound Care: Routine wound care.  7. Fluids/Electrolytes/Nutrition: Monitor I/Os. Monitor I/Os. Maintain adequate nutritional and hydration sources. Offer supplements between meals during the day and family encouraged to bring food from home 8. HTN: Monitor BP bid- continue lisinopril daily.   Controlled 8/30 9. ABLA: Post surgery. Will monitor for now.   Hb 9.0 on 8/28  Cont to monitor 10. Lethargy: Decreased Oxycodone to 5 mg and change robaxin to po prn.   11. Hypokalemia: Resolved with  supplement--encourage intake   K+ 4.1 on 8/28  Cont to monitor 12. Vitamin B 12 deficiency:  Vitamin B12 - 104. He was started on B12 injections on 8/24--continue weekly X 4 doses.  13. Neuropathy: Was on Neurontin 600 mg tid at home but had sedating SE per wife. Resumed low dose at nights.  14. COPD: Currently is 2 PPD smoker but asymptomatic/no issues in the past per wife.   Bupropion 150 daily started 8/29 due to nicotine withdrawal signs, plan to increase tomorrow 15. Hypoalbuminemia  Supplement initiated 8/28  LOS (Days) 4 A FACE TO FACE EVALUATION WAS PERFORMED  Haliyah Fryman Lorie Phenix 12/18/2016 8:04 AM

## 2016-12-18 NOTE — Progress Notes (Signed)
Occupational Therapy Session Note  Patient Details  Name: Derek [name] MRN: [id number] Date of Birth: May 10, 1945  Today's Date: 12/18/2016 OT Individual Time: 1145-1155 OT Individual Time Calculation (min): 10 min Make up time   Short Term Goals: Week 1:  OT Short Term Goal 1 (Week 1): STGs equal to LTGs based on ELOS.  Skilled Therapeutic Interventions/Progress Updates:    Treatment session with focus on make up time from early AM session.  Pt just returned from walking around unit with RN and wife.  Pt reports increase in pain post ambulation.  Attempted to engage pt in discussion regarding back precautions and adaptive equipment and techniques to increase independence with self-care tasks and mobility.  Pt able to recall 1 of 3 back precautions with cues.  Wife present and able to assist pt with recall.  Therapy Documentation Precautions:  Precautions Precautions: Back Precaution Booklet Issued: No Precaution Comments: pt unable to recall precautions Required Braces or Orthoses: Spinal Brace Spinal Brace: Thoracolumbosacral orthotic, Applied in supine position Restrictions Weight Bearing Restrictions: No Pain:  Pt with c/o pain in back, not rated.  See Function Navigator for Current Functional Status.   Therapy/Group: Individual Therapy  Simonne Come 12/18/2016, 12:25 PM

## 2016-12-18 NOTE — Progress Notes (Signed)
Physical Therapy Session Note  Patient Details  Name: Derek [name] MRN: [id number] Date of Birth: 04/20/1946  Today's Date: 12/18/2016 PT Individual Time: 0800-0845 PT Individual Time Calculation (min): 45 min  PT Missed Time: 15 min  Short Term Goals: Week 1:  PT Short Term Goal 1 (Week 1): STG=LTG due to ELOS  Skilled Therapeutic Interventions/Progress Updates:   Pt in recliner upon arrival and agreeable to therapy, states pain is improved today. Ambulated to/from toilet w/ supervision and performed static standing balance and pericare w/ supervision.   Worked on activity tolerance and LE strengthening this session. Ambulated to/from day room w/ supervision using RW, ~300' each way. Dynamic standing balance while performing unilateral and bilateral UE tasks. Verbal cues to not use UE support on RW or table. 4-5 min bouts of standing w/ supervision. Emphasis on no UE support and increased trunk extension.   LE strengthening exercises w/ UE support on RW: -partial knee bends x10  -standing R/L hip abduction and extension, x10  -knee marches, x10  -sit<>stands x5   Pt requesting to go back to room and end session early. PT offered options of other treatment ideas, pt declined. Ended session in recliner, pink safety belt on, call bell within reach and all needs met.   Therapy Documentation Precautions:  Precautions Precautions: Back Precaution Booklet Issued: No Precaution Comments: pt unable to recall precautions Required Braces or Orthoses: Spinal Brace Spinal Brace: Thoracolumbosacral orthotic, Applied in supine position Restrictions Weight Bearing Restrictions: No Vital Signs: Therapy Vitals Temp: 98.4 F (36.9 C) Temp Source: Oral Pulse Rate: 86 Resp: 17 BP: (!) 97/40 Patient Position (if appropriate): Lying Oxygen Therapy SpO2: 91 % O2 Device: Not Delivered  See Function Navigator for Current Functional Status.   Therapy/Group: Individual Therapy  Flay Ghosh K  Arnette 12/18/2016, 8:49 AM

## 2016-12-18 NOTE — Progress Notes (Signed)
Social Work Patient ID: Derek [name], male   DOB: 1945-09-06, 71 y.o.   MRN: [id number] Team feels pt will be ready to discharge home on Monday instead of Wed. All on board will work toward discharge Monday.

## 2016-12-18 NOTE — Progress Notes (Signed)
Occupational Therapy Discharge Summary  Patient Details  Name: Derek [name] MRN: [id number] Date of Birth: 11/21/1945  Today's Date: 12/20/2016 OT Individual Time: 3112-1624 OT Individual Time Calculation (min): 42 min   Skilled OT Tx: 1;1. Pt with no report of pain. Pt seated EOB with wife in room upon entering and wife reports assisting pt donning TLSO in supine prior to OT arrival. OT encouraged wife to supervise pt as if OT wasn't there in prep for d/c home.  Pt ambulates with RW with wife providing cues for upright posture and safety awareness. Pt voids bowel and bladder on toilet with MOD I for clothing managment/hygiene. Pt ambulates to tub room to complete simulated shower as pt declines opportunity to shower because he "showered last night." OT provides VC for sequencing safe use of TTB. Pt demonstrates use of shower sponge and lateral leans to wash buttocks with brace doffed. Wife assists donning/doffin brace and pt changes shirt with MOD I. Pt demonstrates reacher use to doff/don socks/pants with supervision and wife provides cues to not break back precautions. Pt dons slip on shoes with supervision. Pt stands at sink to brush teeth with MOD I. Exited session with pt supine in bed, call light in reach and all needs met.  Patient has met 8 of 8 long term goals due to improved activity tolerance, improved balance and postural control.  Patient to discharge at overall Supervision level.  Patient's care partner is independent to provide the necessary cognitive assistance at discharge.    Reasons goals not met:   Recommendation:  Patient will benefit from ongoing skilled OT services in home health setting to continue to advance functional skills in the area of BADL.  Equipment: No equipment provided  Reasons for discharge: treatment goals met and discharge from hospital  Patient/family agrees with progress made and goals achieved: Yes  OT Discharge Precautions/Restrictions   Precautions Precautions: Back Precaution Booklet Issued: No Precaution Comments: pt able to recall 1/3 precautions Required Braces or Orthoses: Spinal Brace Spinal Brace: Thoracolumbosacral orthotic;Applied in supine position Restrictions Weight Bearing Restrictions: No  Pain Pain Assessment Pain Assessment: Faces Faces Pain Scale: Hurts a little bit Pain Type: Surgical pain Pain Location: Back Pain Descriptors / Indicators: Discomfort Pain Onset: With Activity Pain Intervention(s): Repositioned ADL  See Function section of chart for details  Vision Baseline Vision/History: Wears glasses Wears Glasses: Reading only Patient Visual Report: No change from baseline Vision Assessment?: No apparent visual deficits Perception  Perception: Within Functional Limits Praxis Praxis: Intact Cognition Overall Cognitive Status: Impaired/Different from baseline Arousal/Alertness: Lethargic Orientation Level: Oriented to situation;Oriented to person;Oriented to place Attention: Sustained;Focused Focused Attention: Appears intact Sustained Attention: Impaired Selective Attention: Impaired Selective Attention Impairment: Functional basic Memory: Impaired Memory Impairment: Storage deficit;Retrieval deficit Awareness: Impaired Awareness Impairment: Intellectual impairment;Emergent impairment Problem Solving: Impaired Problem Solving Impairment: Functional basic Safety/Judgment: Impaired Sensation Sensation Light Touch: Appears Intact Stereognosis: Appears Intact Hot/Cold: Appears Intact Proprioception: Appears Intact Coordination Gross Motor Movements are Fluid and Coordinated: Yes Fine Motor Movements are Fluid and Coordinated: Yes Coordination and Movement Description: Coordination [place] for UE use bilaterally Motor  Motor Motor - Discharge Observations: generalized weakness Mobility  Transfers Transfers: Sit to Stand;Stand to Sit Sit to Stand: 5: Supervision;With upper  extremity assist;From toilet Stand to Sit: 5: Supervision;With upper extremity assist;To toilet  Trunk/Postural Assessment  Cervical Assessment Cervical Assessment: Exceptions to [facility] (flexed trunk at rest) Thoracic Assessment Thoracic Assessment: Exceptions to [facility] (thoracic flexion in sitting and standing) Lumbar Assessment Lumbar  Assessment: Exceptions to [facility] (TLSO in place but pt with increased lumbar flexion in standing) Postural Control Postural Control: Deficits on evaluation (Pt unable to maintain upright trunk and lumbar extension in standing during mobility)  Balance Balance Balance Assessed: Yes Dynamic Sitting Balance Dynamic Sitting - Balance Support: Feet supported;During functional activity Dynamic Sitting - Level of Assistance: 5: Stand by assistance Static Standing Balance Static Standing - Balance Support: During functional activity Static Standing - Level of Assistance: 5: Stand by assistance Dynamic Standing Balance Dynamic Standing - Balance Support: During functional activity;Left upper extremity supported;Right upper extremity supported Dynamic Standing - Level of Assistance: 5: Stand by assistance Extremity/Trunk Assessment RUE Assessment RUE Assessment: Within Functional Limits (WFLS for ADLs, not formally tested secondary to back precautions) LUE Assessment LUE Assessment: Within Functional Limits [facility] for ADLs, not formally tested secondary to back precautions)   See Function Navigator for Current Functional Status.  MCGUIRE,JAMES OTR/L 12/18/2016, 4:10 PM

## 2016-12-18 NOTE — Progress Notes (Signed)
Nurse reports that patient with dizziness yesterday afternoon with SBP in 70's. Is low again this am--hold lisinopril today and resume at 5 mg tomorrow. Pain better controlled and elevations might be in part to this.   Also very focused yesterday and this am on going out to smoking.  Nicotine patch added additionally yesterday.

## 2016-12-18 NOTE — Progress Notes (Signed)
Speech Language Pathology Session Note & Discharge Summary  Patient Details  Name: Derek [name] MRN: [id number] Date of Birth: 06/18/1945  Today's Date: 12/18/2016 SLP Individual Time: 1400-1430 SLP Individual Time Calculation (min): 30 min   Skilled Therapeutic Interventions: Skilled treatment session focused on cognitive goals. SLP facilitated session by providing supervision verbal cues for functional problem solving during a basic money management task and recalled functional information like when to donn/doff brace with Mod I. Overall, patient appears much more alert compared to day of evaluation with improved engagement and cognitive functioning. Patient's wife present and educated on strategies to utilize at home to maximize recall and overall safety. She verbalized understanding and agreement. Patient left upright in recliner with wife present. Continue with current plan of care.   Patient has met 4 of 4 long term goals.  Patient to discharge at [facility] level.   Reasons goals not met: N/A   Clinical Impression/Discharge Summary:  Patient has made functional gains and has met 4 of 4 LTG's this admission. Currently, patient is overall Min A to complete functional and familiar tasks safely in regards to problem solving, recall, attention and awareness. Although patient is at goal level, patient continues to demonstrate mild cognitive impairments. However, patient's wife reports that this is his baseline cognitive functioning after surgery (per report this is his 10th back surgery) and that patient's cognitive function improves within a few weeks. Patient's wife does not appear concerned about his cognitive function at this time and feels speech therapy is not warranted. Therefore, patient will be discharged from skilled SLP intervention without recommendations for f/u.   Care Partner:  Caregiver Able to Provide Assistance: Yes  Type of Caregiver Assistance:  Physical;Cognitive  Recommendation:  24 hour supervision/assistance;None  Rationale for SLP Follow Up: Maximize cognitive function and independence   Equipment: N/A    Reasons for discharge: Treatment goals met   Patient/Family Agrees with Progress Made and Goals Achieved: Yes   Function:  Cognition Comprehension Comprehension assist level: Understands basic 75 - 89% of the time/ requires cueing 10 - 24% of the time  Expression   Expression assist level: Expresses basic 75 - 89% of the time/requires cueing 10 - 24% of the time. Needs helper to occlude trach/needs to repeat words.  Social Interaction Social Interaction assist level: Interacts appropriately 50 - 74% of the time - May be physically or verbally inappropriate.  Problem Solving Problem solving assist level: Solves basic 75 - 89% of the time/requires cueing 10 - 24% of the time  Memory Memory assist level: Recognizes or recalls 75 - 89% of the time/requires cueing 10 - 24% of the time   [name] 12/18/2016, 2:42 PM

## 2016-12-18 NOTE — Progress Notes (Signed)
Occupational Therapy Session Note  Patient Details  Name: Derek [name] MRN: [id number] Date of Birth: 29-Jul-1945  Today's Date: 12/18/2016 OT Individual Time: [phone] OT Individual Time Calculation (min): 58 min    Short Term Goals: Week 1:  OT Short Term Goal 1 (Week 1): STGs equal to LTGs based on ELOS.  Skilled Therapeutic Interventions/Progress Updates:    Pt completed bathing and dressing sit to stand from the bedside chair.  He still demonstrates lethargy and decreased memory at times, unable to state year "2016" but was able to state month.  Mod instructional cueing to sequence bathing.  Pt still with episodes of pausing and closing his eyes, requiring mod instructional cueing to initiate and complete.  Min guard assist for sit to stand with toilet transfer and when washing peri area.  He was able to donn TLSO after washing UB with mod assist.  Demonstrated good use of the reacher to doff LB clothing and donn new pants, but could not donn second gripper sock without max instructional cueing to use sockaide, even though he had just donned the first one with it without cueing.  Finished session with pt in bedside chair with call button and phone in reach.    Therapy Documentation Precautions:  Precautions Precautions: Back Precaution Booklet Issued: No Precaution Comments: pt unable to recall precautions Required Braces or Orthoses: Spinal Brace Spinal Brace: Thoracolumbosacral orthotic, Applied in supine position Restrictions Weight Bearing Restrictions: No  Pain: Pain Assessment Pain Assessment: Faces Faces Pain Scale: Hurts a little bit Pain Type: Surgical pain Pain Location: Back Pain Orientation: Lower Pain Descriptors / Indicators: Grimacing;Discomfort Pain Onset: With Activity Pain Intervention(s): Repositioned ADL: See Function Navigator for Current Functional Status.   Therapy/Group: Individual Therapy  Epiphany Seltzer OTR/L 12/18/2016, 12:31 PM

## 2016-12-18 NOTE — Progress Notes (Signed)
Return call from MD on-call. NNO's at this time. Continuing with current orders at this time.

## 2016-12-19 ENCOUNTER — Inpatient Hospital Stay (HOSPITAL_COMMUNITY): Payer: PPO | Admitting: Speech Pathology

## 2016-12-19 ENCOUNTER — Inpatient Hospital Stay (HOSPITAL_COMMUNITY): Payer: PPO | Admitting: Physical Therapy

## 2016-12-19 ENCOUNTER — Inpatient Hospital Stay (HOSPITAL_COMMUNITY): Payer: PPO

## 2016-12-19 DIAGNOSIS — I10 Essential (primary) hypertension: Secondary | ICD-10-CM

## 2016-12-19 DIAGNOSIS — G8918 Other acute postprocedural pain: Secondary | ICD-10-CM

## 2016-12-19 NOTE — Progress Notes (Signed)
Occupational Therapy Session Note  Patient Details  Name: GRANVEL PROUDFOOT MRN: [id number] Date of Birth: 26-Oct-1945  Today's Date: 12/19/2016 OT Individual Time: 0700-0755 OT Individual Time Calculation (min): 55 min    Short Term Goals: Week 1:  OT Short Term Goal 1 (Week 1): STGs equal to LTGs based on ELOS.  Skilled Therapeutic Interventions/Progress Updates:    1:1. Focus of session on safety awareness, back precautions, RW management and functional mobility. Pt requires increased time to arouse for session. Wife present for beginning of session. Pt dons brace in supine, however attempts to convince OT to allow to don sitting EOB. OT educates on MD orders and back precautions. Pt reporting 6/10 back pain. RN alerted and administered medication. Pt supine>EOB with supervision. Pt dons socks with sock aide, however attempts to don by bending forward. OT continues education on importance of AE use to protect surgery. Pt stands at sink to wash face and brush hair. Pt ambulates to all tx destinations with supervision. Pt completes ambulatory transfers to tub bench, low caouch and recliner with supervision and VC for RW management as pt atttempts to leave RW off to side and sit without reaching back. Pt ambulats throughout kitchen obtaining items from cabinets/appliances with superviviosn and VC for safety reaching with RW to simulate simple meal prep. Exited session with pt seated in w/c with call light in reach, WRB donned and all needs met  Therapy Documentation Precautions:  Precautions Precautions: Back Precaution Booklet Issued: No Precaution Comments: pt able to recall 1/3 precautions Required Braces or Orthoses: Spinal Brace Spinal Brace: Thoracolumbosacral orthotic, Applied in supine position Restrictions Weight Bearing Restrictions: No   See Function Navigator for Current Functional Status.   Therapy/Group: Individual Therapy  Tonny Branch 12/19/2016, 7:56 AM

## 2016-12-19 NOTE — Progress Notes (Addendum)
Physical Therapy Session Note  Patient Details  Name: Derek [name] MRN: [id number] Date of Birth: [birth year]  Today's Date: 12/19/2016 PT Individual Time: 8412-8208 AND 1410-1446 PT Individual Time Calculation (min): 40 min AND 36 min  Short Term Goals: Week 1:  PT Short Term Goal 1 (Week 1): STG=LTG due to ELOS  Skilled Therapeutic Interventions/Progress Updates:   Session 1:  Received pt while toileting w/ NT and pt agreeable to therapy, c/o pain as detailed below. Pt performed pericare and personal hygiene w/ supervision and ambulated w/ RW to sink and then recliner. Ambulated 300+ ft to/from day room w/ RW to work on functional mobility and endurance. Noted decreased reliance on UE support on RW this date and decreased flexed trunk posture while ambulating. NuStep @ L3-4 for 15 min w/o rest breaks w/ verbal cues to attend to task. Practiced sit<>stands from low surface at recliner in apartment w/ supervision and verbal cues to use LE strength and practiced negotiating steps - 4 steps w/ Min guard. Ended session in recliner in care of wife, all needs met.   Session 2:  Pt supine in bed upon arrival and agreeable to therapy, no c/o pain. Worked on functional mobility and tolerance to OOB activity this session. Assisted pt in donning TSLO in supine and transferred to EOB. Ambulated 300' to/from outside, w/ supervision, rest break on outside bench. Ambulated on incline and uneven ground w/ supervision and verbal cues for safety and RW management. Practiced car transfers w/ supervision. Pt requesting to end session and return to bed. Ended session supine in bed and all needs met.   Therapy Documentation Precautions:  Precautions Precautions: Back Precaution Booklet Issued: No Precaution Comments: pt able to recall 1/3 precautions Required Braces or Orthoses: Spinal Brace Spinal Brace: Thoracolumbosacral orthotic, Applied in supine position Restrictions Weight Bearing Restrictions:  No Pain: Pain Assessment Pain Assessment: 0-10 Pain Score: 5  Pain Type: Chronic pain Pain Location: Back Pain Orientation: Lower Pain Descriptors / Indicators: Aching;Dull Pain Frequency: Constant Pain Intervention(s): Medication (See eMAR)  See Function Navigator for Current Functional Status.   Therapy/Group: Individual Therapy  Kyomi Hector K Arnette 12/19/2016, 12:06 PM

## 2016-12-19 NOTE — Plan of Care (Signed)
Problem: RH PAIN MANAGEMENT Goal: RH STG PAIN MANAGED AT OR BELOW PT'S PAIN GOAL Less than 3,on 1 to 10 scale.  Outcome: Not Progressing PRN pain medication admin today.  Reports 5 to 7 pain level out of 10.

## 2016-12-19 NOTE — Progress Notes (Signed)
[place] PHYSICAL MEDICINE & REHABILITATION     PROGRESS NOTE  Subjective/Complaints:  Seen lying in bed this morning. He states he slept well overnight. Yesterday he was noted to have low blood pressures. Medications adjusted.  ROS: Denies CP, SOB, N/V/D.  Objective: Vital Signs: Blood pressure (!) 131/45, pulse 90, temperature 97.7 F (36.5 C), temperature source Oral, resp. rate 18, height 5\' 9"  (1.753 m), weight 86.7 kg (191 lb 3.2 oz), SpO2 98 %. No results found. No results for input(s): WBC, HGB, HCT, PLT in the last 72 hours. No results for input(s): NA, K, CL, GLUCOSE, BUN, CREATININE, CALCIUM in the last 72 hours.  Invalid input(s): CO CBG (last 3)  No results for input(s): GLUCAP in the last 72 hours.  Wt Readings from Last 3 Encounters:  12/15/16 86.7 kg (191 lb 3.2 oz)  12/09/16 98.8 kg (217 lb 13 oz)  09/23/16 99.3 kg (219 lb)    Physical Exam:  BP (!) 131/45 (BP Location: Left Arm)   Pulse 90   Temp 97.7 F (36.5 C) (Oral)   Resp 18   Ht 5\' 9"  (1.753 m)   Wt 86.7 kg (191 lb 3.2 oz)   SpO2 98%   BMI 28.24 kg/m  Constitutional: He appears well-developed and well-nourished. He appears ill. No distress.  HENT: Normocephalic and atraumatic.  Eyes: EOM are normal. No discharge.  Cardiovascular: RRR.  No JVD. Respiratory: Effort normal and breath sounds normal.  GI: Soft. Bowel sounds are normal. Musculoskeletal: He exhibits no edema or tenderness.  Neurological: He is alert and oriented x2 Motor: 4/5 grossly throughout (stable).  Skin: Skin is warm and dry. He is not diaphoretic.  Back incision c/d/i  Psychiatric: His mood appears anxious. He expresses impulsivity.   Assessment/Plan: 1. Functional deficits secondary to lumbar chance fractures s/p PLIF and kyphoplasty which require 3+ hours per day of interdisciplinary therapy in a comprehensive inpatient rehab setting. Physiatrist is providing close team supervision and 24 hour management of active  medical problems listed below. Physiatrist and rehab team continue to assess barriers to discharge/monitor patient progress toward functional and medical goals.  Function:  Bathing Bathing position   Position:  (bedside chair)  Bathing parts Body parts bathed by patient: Right arm, Left arm, Chest, Abdomen, Front perineal area, Right upper leg, Left upper leg, Right lower leg, Left lower leg, Buttocks Body parts bathed by helper: Back  Bathing assist Assist Level: Supervision or verbal cues      Upper Body Dressing/Undressing Upper body dressing   What is the patient wearing?: Pull over shirt/dress, Orthosis     Pull over shirt/dress - Perfomed by patient: Thread/unthread right sleeve, Thread/unthread left sleeve, Put head through opening, Pull shirt over trunk       Orthosis activity level: Performed by helper  Upper body assist        Lower Body Dressing/Undressing Lower body dressing   What is the patient wearing?: Non-skid slipper socks, Pants     Pants- Performed by patient: Thread/unthread right pants leg, Thread/unthread left pants leg, Pull pants up/down Pants- Performed by helper: Thread/unthread left pants leg, Thread/unthread right pants leg Non-skid slipper socks- Performed by patient: Don/doff left sock, Don/doff right sock Non-skid slipper socks- Performed by helper: Don/doff right sock, Don/doff left sock             TED Hose - Performed by patient: Don/doff right TED hose, Don/doff left TED hose TED Hose - Performed by helper: Don/doff right TED hose,  Don/doff left TED hose  Lower body assist Assist for lower body dressing: Touching or steadying assistance (Pt > 75%)      Toileting Toileting   Toileting steps completed by patient: Adjust clothing prior to toileting, Performs perineal hygiene, Adjust clothing after toileting   Toileting Assistive Devices: Grab bar or rail  Toileting assist Assist level: Touching or steadying assistance (Pt.75%)    Transfers Chair/bed transfer   Chair/bed transfer method: Ambulatory Chair/bed transfer assist level: Supervision or verbal cues Chair/bed transfer assistive device: Armrests, Medical sales representative     Max distance: 300' Assist level: Supervision or verbal cues   Wheelchair          Cognition Comprehension Comprehension assist level: Understands basic 90% of the time/cues < 10% of the time  Expression Expression assist level: Expresses basic 75 - 89% of the time/requires cueing 10 - 24% of the time. Needs helper to occlude trach/needs to repeat words.  Social Interaction Social Interaction assist level: Interacts appropriately 90% of the time - Needs monitoring or encouragement for participation or interaction.  Problem Solving Problem solving assist level: Solves basic 75 - 89% of the time/requires cueing 10 - 24% of the time  Memory Memory assist level: Recognizes or recalls 90% of the time/requires cueing < 10% of the time    Medical Problem List and Plan: 1.  Cognitive deficits, weakness, poor activity toleratnace secondary to lumbar chance fractures s/p PLIF and kyphoplasty.  Cont CIR 2.  DVT Prophylaxis/Anticoagulation: Mechanical: Sequential compression devices, below knee Bilateral lower extremities 3. Pain Management: Limit neurosedating meds--decreased oxycodone to 5 mg prn severe pain.  Resumed low dose Neurontin at nights to help with neuropathy and sleep.   Lidoderm patch ordered for back on 8/29  Improving 4. Mood: LCSW to follow for evaluation and support.  5. Neuropsych: This patient is not capable of making decisions on his own behalf. 6. Skin/Wound Care: Routine wound care.  7. Fluids/Electrolytes/Nutrition: Monitor I/Os. Monitor I/Os. Maintain adequate nutritional and hydration sources. Offer supplements between meals during the day and family encouraged to bring food from home 8. HTN: Monitor BP bid-   Lisinopril decreased to 5 mg daily.  9.  ABLA: Post surgery. Will monitor for now.   Hb 9.0 on 8/28  Cont to monitor 10. Lethargy: Decreased Oxycodone to 5 mg and change robaxin to po prn.   11. Hypokalemia: Resolved with supplement--encourage intake   K+ 4.1 on 8/28  Cont to monitor 12. Vitamin B 12 deficiency:  Vitamin B12 - 104. He was started on B12 injections on 8/24--continue weekly X 4 doses.  13. Neuropathy: Was on Neurontin 600 mg tid at home but had sedating SE per wife. Resumed low dose at nights.  14. COPD: Currently is 2 PPD smoker but asymptomatic/no issues in the past per wife.   Bupropion seed and nicotine patch started on 8/31 15. Hypoalbuminemia  Supplement initiated 8/28  LOS (Days) 5 A FACE TO FACE EVALUATION WAS PERFORMED  Braden Cimo Lorie Phenix 12/19/2016 7:27 AM

## 2016-12-20 ENCOUNTER — Inpatient Hospital Stay (HOSPITAL_COMMUNITY): Payer: PPO

## 2016-12-20 NOTE — Progress Notes (Signed)
[place] PHYSICAL MEDICINE & REHABILITATION     PROGRESS NOTE  Subjective/Complaints:  Pt seen laying in bed this AM.  He slept well overnight. His awareness appears to be improving.  ROS: Denies CP, SOB, N/V/D.  Objective: Vital Signs: Blood pressure 120/60, pulse 88, temperature 98.2 F (36.8 C), temperature source Oral, resp. rate 18, height 5\' 9"  (1.753 m), weight 84.8 kg (187 lb), SpO2 96 %. No results found. No results for input(s): WBC, HGB, HCT, PLT in the last 72 hours. No results for input(s): NA, K, CL, GLUCOSE, BUN, CREATININE, CALCIUM in the last 72 hours.  Invalid input(s): CO CBG (last 3)  No results for input(s): GLUCAP in the last 72 hours.  Wt Readings from Last 3 Encounters:  12/20/16 84.8 kg (187 lb)  12/09/16 98.8 kg (217 lb 13 oz)  09/23/16 99.3 kg (219 lb)    Physical Exam:  BP 120/60 (BP Location: Right Arm)   Pulse 88   Temp 98.2 F (36.8 C) (Oral)   Resp 18   Ht 5\' 9"  (1.753 m)   Wt 84.8 kg (187 lb)   SpO2 96%   BMI 27.62 kg/m  Constitutional: He appears well-developed and well-nourished. He appears ill. No distress.  HENT: Normocephalic and atraumatic.  Eyes: EOM are normal. No discharge.  Cardiovascular: RRR.  No JVD. Respiratory: Effort normal and breath sounds normal.  GI: Soft. Bowel sounds are normal. Musculoskeletal: He exhibits no edema or tenderness.  Neurological: He is alert and oriented x2, improving Motor: 4/5 grossly throughout (improving).  Skin: Skin is warm and dry. He is not diaphoretic.  Back incision c/d/i  Psychiatric: His mood appears anxious. He expresses impulsivity.   Assessment/Plan: 1. Functional deficits secondary to lumbar chance fractures s/p PLIF and kyphoplasty which require 3+ hours per day of interdisciplinary therapy in a comprehensive inpatient rehab setting. Physiatrist is providing close team supervision and 24 hour management of active medical problems listed below. Physiatrist and rehab team  continue to assess barriers to discharge/monitor patient progress toward functional and medical goals.  Function:  Bathing Bathing position   Position:  (bedside chair)  Bathing parts Body parts bathed by patient: Right arm, Left arm, Chest, Abdomen, Front perineal area, Right upper leg, Left upper leg, Right lower leg, Left lower leg, Buttocks Body parts bathed by helper: Back  Bathing assist Assist Level: Supervision or verbal cues      Upper Body Dressing/Undressing Upper body dressing   What is the patient wearing?: Pull over shirt/dress, Orthosis     Pull over shirt/dress - Perfomed by patient: Thread/unthread right sleeve, Thread/unthread left sleeve, Put head through opening, Pull shirt over trunk       Orthosis activity level: Performed by helper  Upper body assist        Lower Body Dressing/Undressing Lower body dressing   What is the patient wearing?: Non-skid slipper socks, Pants     Pants- Performed by patient: Thread/unthread right pants leg, Thread/unthread left pants leg, Pull pants up/down Pants- Performed by helper: Thread/unthread left pants leg, Thread/unthread right pants leg Non-skid slipper socks- Performed by patient: Don/doff left sock, Don/doff right sock Non-skid slipper socks- Performed by helper: Don/doff right sock, Don/doff left sock             TED Hose - Performed by patient: Don/doff right TED hose, Don/doff left TED hose TED Hose - Performed by helper: Don/doff right TED hose, Don/doff left TED hose  Lower body assist Assist for lower body  dressing: Touching or steadying assistance (Pt > 75%)      Toileting Toileting   Toileting steps completed by patient: Adjust clothing prior to toileting, Adjust clothing after toileting, Performs perineal hygiene   Toileting Assistive Devices: Grab bar or rail  Toileting assist Assist level: Touching or steadying assistance (Pt.75%)   Transfers Chair/bed transfer   Chair/bed transfer method:  Ambulatory Chair/bed transfer assist level: Supervision or verbal cues Chair/bed transfer assistive device: Armrests, Medical sales representative     Max distance: 300' Assist level: Supervision or verbal cues   Wheelchair          Cognition Comprehension Comprehension assist level: Understands basic 75 - 89% of the time/ requires cueing 10 - 24% of the time  Expression Expression assist level: Expresses basic 75 - 89% of the time/requires cueing 10 - 24% of the time. Needs helper to occlude trach/needs to repeat words.  Social Interaction Social Interaction assist level: Interacts appropriately 50 - 74% of the time - May be physically or verbally inappropriate.  Problem Solving Problem solving assist level: Solves basic 50 - 74% of the time/requires cueing 25 - 49% of the time  Memory Memory assist level: Recognizes or recalls 50 - 74% of the time/requires cueing 25 - 49% of the time    Medical Problem List and Plan: 1.  Cognitive deficits, weakness, poor activity toleratnace secondary to lumbar chance fractures s/p PLIF and kyphoplasty.  Cont CIR  Plan for DC tomorrow 2.  DVT Prophylaxis/Anticoagulation: Mechanical: Sequential compression devices, below knee Bilateral lower extremities 3. Pain Management: Limit neurosedating meds--decreased oxycodone to 5 mg prn severe pain.  Resumed low dose Neurontin at nights to help with neuropathy and sleep.   Lidoderm patch ordered for back on 8/29  Improving 4. Mood: LCSW to follow for evaluation and support.  5. Neuropsych: This patient is not capable of making decisions on his own behalf. 6. Skin/Wound Care: Routine wound care.  7. Fluids/Electrolytes/Nutrition: Monitor I/Os. Monitor I/Os. Maintain adequate nutritional and hydration sources. Offer supplements between meals during the day and family encouraged to bring food from home 8. HTN: Monitor BP bid-   Lisinopril decreased to 5 mg daily.   Controlled on 9/2 9. ABLA: Post  surgery. Will monitor for now.   Hb 9.0 on 8/28  Cont to monitor 10. Lethargy: Decreased Oxycodone to 5 mg and change robaxin to po prn.   11. Hypokalemia: Resolved with supplement--encourage intake   K+ 4.1 on 8/28  Cont to monitor 12. Vitamin B 12 deficiency:  Vitamin B12 - 104. He was started on B12 injections on 8/24--continue weekly X 4 doses.  13. Neuropathy: Was on Neurontin 600 mg tid at home but had sedating SE per wife. Resumed low dose at nights.  14. COPD: Currently is 2 PPD smoker but asymptomatic/no issues in the past per wife.   Bupropion seed and nicotine patch started on 8/31 15. Hypoalbuminemia  Supplement initiated 8/28  LOS (Days) 6 A FACE TO FACE EVALUATION WAS PERFORMED  Cass Vandermeulen Lorie Phenix 12/20/2016 6:55 AM

## 2016-12-20 NOTE — Progress Notes (Signed)
Physical Therapy Discharge Summary  Patient Details  Name: Derek [name] MRN: [id number] Date of Birth: 1945-09-30  Today's Date: 12/20/2016 PT Individual Time: 0900-0945 PT Individual Time Calculation (min): 45 min    Patient has met 5 of 5 long term goals due to improved activity tolerance, improved balance, increased strength, decreased pain and improved awareness.  Patient to discharge at an ambulatory level Supervision.   Patient's care partner is independent to provide the necessary cognitive assistance at discharge.  All goals met.   Recommendation:  Patient will benefit from ongoing skilled PT services in home health setting to continue to advance safe functional mobility, address ongoing impairments in balance, endurance, strength, RO, functional mobility, and minimize fall risk.  Equipment: No equipment provided, pt already has RW  Reasons for discharge: treatment goals met  Patient/family agrees with progress made and goals achieved: Yes  Interventions Pt seated in recliner with TLSO on upon arrival and agreeable to therapy, no c/o pain. Worked on functional mobility and tolerance to OOB activity this session. Ambulated throughout unit 150' x 3 w/ supervision using RW, verbal cues for upright posture and RW placement. Ambulated on incline and uneven ground w/ supervision and verbal cues for safety and RW management. Practiced car transfer w/ supervision. Pt ascended/descended 12 steps with supervision using B handrails. Discussed concerns about discharge with wife and pt, both deny any questions or concerns. Educated wife on how to provided appropriate supervision/cueing when walking with the pt as the wife tends to over cue. Pt performed bed mobility in rehab apartment Mod I. Wife able to help pt don/doff TLSO independently. Pt left seated in recliner at end of session with needs in reach.   PT Discharge Precautions/Restrictions Precautions Precautions: Back Precaution  Booklet Issued: No Precaution Comments: Pt unable to recall precautions but wife able to recall 3/3 Required Braces or Orthoses: Spinal Brace Spinal Brace: Thoracolumbosacral orthotic;Applied in supine position Restrictions Weight Bearing Restrictions: No Vital Signs Therapy Vitals Temp: 98.2 F (36.8 C) Temp Source: Oral Pulse Rate: 88 Resp: 18 BP: 120/60 Patient Position (if appropriate): Lying Oxygen Therapy SpO2: 96 % O2 Device: Not Delivered Pain Pain Assessment Pain Assessment: 0-10 Pain Score: 7  Faces Pain Scale: Hurts a little bit Pain Type: Surgical pain Pain Location: Back Pain Orientation: Lower Pain Descriptors / Indicators: Aching Pain Onset: On-going Pain Intervention(s): Medication (See eMAR) PAINAD (Pain Assessment in Advanced Dementia) Breathing: normal Negative Vocalization: none Facial Expression: smiling or inexpressive Body Language: relaxed Consolability: no need to console PAINAD Score: 0 Cognition Overall Cognitive Status: Impaired/Different from baseline Arousal/Alertness: Lethargic Orientation Level: Oriented to person;Oriented to place (oriented to month (not year/day)) Attention: Sustained;Focused Focused Attention: Appears intact Sustained Attention: Impaired Sustained Attention Impairment: Functional basic;Verbal basic Selective Attention: Impaired Selective Attention Impairment: Functional basic Memory: Impaired Memory Impairment: Storage deficit;Retrieval deficit Awareness: Impaired Problem Solving: Impaired Safety/Judgment: Impaired Sensation Sensation Light Touch: Appears Intact Stereognosis: Not tested Hot/Cold: Not tested Proprioception: Appears Intact Coordination Gross Motor Movements are Fluid and Coordinated: Yes Fine Motor Movements are Fluid and Coordinated: Yes Heel Shin Test: limited secondary to weakness and limited ROM Motor  Motor Motor: Other (comment) Motor - Discharge Observations: generalized weakness   Trunk/Postural Assessment  Cervical Assessment Cervical Assessment: Exceptions to [facility] (forward head posture) Thoracic Assessment Thoracic Assessment: Exceptions to [facility] (thoracic kyphosis in sitting and standing) Lumbar Assessment Lumbar Assessment: Exceptions to WFL (TLSO, lumbar fusion) Postural Control Postural Control: Within Functional Limits  Balance Balance Balance Assessed: Yes Static Sitting  Balance Static Sitting - Level of Assistance: 6: Modified independent (Device/Increase time) Dynamic Sitting Balance Dynamic Sitting - Level of Assistance: 5: Stand by assistance Static Standing Balance Static Standing - Level of Assistance: 5: Stand by assistance Dynamic Standing Balance Dynamic Standing - Level of Assistance: 5: Stand by assistance Extremity Assessment  RLE Assessment RLE Assessment: Exceptions to [facility] (hip flexor weakness 3/5, generalized weakness 4/5, difficulty following commands for MMT, limited hamstring length) LLE Assessment LLE Assessment: Exceptions to [facility] (generalized weakness 4/5, difficulty following commands for MMT, limited hamstring length)   See Function Navigator for Current Functional Status.  Netta Corrigan, PT, DPT 12/20/2016, 9:22 AM

## 2016-12-20 NOTE — Plan of Care (Signed)
Problem: RH PAIN MANAGEMENT Goal: RH STG PAIN MANAGED AT OR BELOW PT'S PAIN GOAL Less than 3,on 1 to 10 scale.  Outcome: Not Met (add Reason) Patient pain managed to level 5 out of 10 with pain medication, lidocaine patch etc.

## 2016-12-21 ENCOUNTER — Inpatient Hospital Stay (HOSPITAL_COMMUNITY): Payer: PPO

## 2016-12-21 ENCOUNTER — Inpatient Hospital Stay (HOSPITAL_COMMUNITY): Payer: PPO | Admitting: Occupational Therapy

## 2016-12-21 ENCOUNTER — Inpatient Hospital Stay (HOSPITAL_COMMUNITY): Payer: PPO | Admitting: Speech Pathology

## 2016-12-21 MED ORDER — ACETAMINOPHEN 325 MG PO TABS: 325.0000 mg | ORAL_TABLET | ORAL | Status: AC | PRN

## 2016-12-21 MED ORDER — HYDROCODONE-ACETAMINOPHEN 7.5-325 MG PO TABS: 0.5000 | ORAL_TABLET | Freq: Every day | ORAL | 0 refills | Status: DC | PRN

## 2016-12-21 MED ORDER — LIDOCAINE 5 % EX PTCH: 1.0000 | MEDICATED_PATCH | CUTANEOUS | 0 refills | Status: DC

## 2016-12-21 MED ORDER — FERROUS SULFATE 325 (65 FE) MG PO TABS: 325.0000 mg | ORAL_TABLET | Freq: Two times a day (BID) | ORAL | 0 refills | Status: AC

## 2016-12-21 MED ORDER — SENNA 8.6 MG PO TABS: 1.0000 | ORAL_TABLET | Freq: Every day | ORAL | 0 refills | Status: DC

## 2016-12-21 MED ORDER — TRAMADOL HCL 50 MG PO TABS: 50.0000 mg | ORAL_TABLET | Freq: Three times a day (TID) | ORAL | 0 refills | Status: DC

## 2016-12-21 MED ORDER — NICOTINE 21 MG/24HR TD PT24: 21.0000 mg | MEDICATED_PATCH | Freq: Every day | TRANSDERMAL | 0 refills | Status: AC

## 2016-12-21 MED ORDER — GABAPENTIN 100 MG PO CAPS: 200.0000 mg | ORAL_CAPSULE | Freq: Every day | ORAL | 0 refills | Status: AC

## 2016-12-21 MED ORDER — LISINOPRIL 5 MG PO TABS: 5.0000 mg | ORAL_TABLET | Freq: Every day | ORAL | 0 refills | Status: AC

## 2016-12-21 MED ORDER — MUSCLE RUB 10-15 % EX CREA: 1.0000 "application " | TOPICAL_CREAM | CUTANEOUS | 0 refills | Status: DC | PRN

## 2016-12-21 NOTE — Progress Notes (Signed)
Social Work  Discharge Note  The overall goal for the admission was met for:   Discharge location: Yes-HOME WITH WIFE WHO CAN ASSIST WITH HIS CARE  Length of Stay: Yes-7 DAYS  Discharge activity level: Yes-MOD/I-SUPERVISION LEVEL  Home/community participation: Yes  Services provided included: MD, RD, PT, OT, RN, CM, Pharmacy and SW  Financial Services: Private Insurance: [employer]  Follow-up services arranged: Home Health: [facility]-PT,RN,OT and Patient/Family request agency HH: PREF HAD BEFORE, DME: NO NEEDS  Comments (or additional information):WFIE HERE DAILY AND PARTICIPATED IN THERAPIES WITH PT. THIS IS PT'S BOTH BACK SURGERY SO BOTH KNOW THE PLAN AND WHAT TO DO.  Patient/Family verbalized understanding of follow-up arrangements: Yes  Individual responsible for coordination of the follow-up plan: BRENDA-WIFE  Confirmed correct DME delivered: [name] 12/21/2016    [name]

## 2016-12-21 NOTE — Progress Notes (Signed)
Pt discharged to home today. PA Reesa Chew reviewed discharge instructions with wife and patient. Pt escorted by wheelchair to ground floor by wife with NT Jasmine accompanying them.

## 2016-12-21 NOTE — Discharge Instructions (Signed)
Inpatient Rehab Discharge Instructions  Derek [name] Discharge date and time:  12/21/16  Activities/Precautions/ Functional Status: Activity: No lifting, driving, or strenuous exercise till cleared by MD. No bending, twisting, arching or lifting items over 5 lbs.  Diet: regular diet Wound Care:   Functional status:  ___ No restrictions     ___ Walk up steps independently _X__ 24/7 supervision/assistance   ___ Walk up steps with assistance ___ Intermittent supervision/assistance  ___ Bathe/dress independently _X__ Walk with walker     ___ Bathe/dress with assistance ___ Walk Independently    ___ Shower independently ___ Walk with assistance    ___ Shower with assistance _X__ No alcohol     ___ Return to work/school ________    Special Instructions: 1.NO SMOKING--see nicotine taper schedule. 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 It is hard for one partner to quit when another family member is smoking in the home.    COMMUNITY REFERRALS UPON DISCHARGE:    Home Health:   PT, OT, RN   Agency:[facility] [phone]   Date of last service:12/21/2016   Medical Equipment/Items Ordered:HAS ALL NEEDED EQUIPMENT FORM PREVIOUS SURGERIES   My questions have been answered and I understand these instructions. I will adhere to these goals and the provided educational materials after my discharge from the hospital.  Patient/Caregiver Signature _______________________________ Date __________  Clinician Signature _______________________________________ Date __________  Please bring this form and your medication list with you to all your follow-up doctor's appointments.

## 2016-12-21 NOTE — Discharge Summary (Signed)
Physician Discharge Summary  Patient ID: Derek [name] MRN: [id number] DOB/AGE: 1946-04-15 71 y.o.  Admit date: 12/14/2016 Discharge date: 12/21/2016  Discharge Diagnoses:  Principal Problem:   Other fracture of fourth lumbar vertebra, initial encounter for closed fracture [facility]) Active Problems:   Hypoalbuminemia due to protein-calorie malnutrition (Derek [name])   Vitamin B 12 deficiency   Tobacco abuse   Nicotine withdrawal   S/P lumbar fusion   Essential hypertension   Postoperative pain   Discharged Condition: stable   Significant Diagnostic Studies: N/A   Labs:  Basic Metabolic Panel: BMP Latest Ref Rng & Units 12/15/2016 12/13/2016 12/12/2016  Glucose 65 - 99 mg/dL 98 107(H) 166(H)  BUN 6 - 20 mg/dL 8 5(L) 6  Creatinine 0.61 - 1.24 mg/dL 0.86 0.78 0.87  Sodium 135 - 145 mmol/L 136 137 135  Potassium 3.5 - 5.1 mmol/L 4.1 3.5 3.1(L)  Chloride 101 - 111 mmol/L 97(L) 100(L) 99(L)  CO2 22 - 32 mmol/L 30 28 26   Calcium 8.9 - 10.3 mg/dL 8.5(L) 8.6(L) 8.6(L)    CBC: CBC Latest Ref Rng & Units 12/15/2016 12/13/2016 12/12/2016  WBC 4.0 - 10.5 K/uL 9.8 10.3 12.0(H)  Hemoglobin 13.0 - 17.0 g/dL 9.0(L) 8.6(L) 8.9(L)  Hematocrit 39.0 - 52.0 % 28.9(L) 26.7(L) 27.5(L)  Platelets 150 - 400 K/uL 549(H) 421(H) 411(H)    CBG: No results for input(s): GLUCAP in the last 168 hours.  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 He was admitted on   12/06/16 with confusion, leucocytosis, recent back pain due to UTI and fall on the way [facility] ED. He was found to have L3 Chance type fracture with 25% compression fracture of L4 vertebral body.  Blood cultures X 2 done and he was started on IV antibiotics and transferred to [facility] for treatment.  MRI lumbar spine done 8/22 revealing L3 and L4 compression fractures, abnormal  contrast and edema right psoas and posterior paraspinal fluid collection at L3-S1 levels. He underwent exploration of lumbar fusion with ORIF L3 and L4 Chance fractures with open Kyphoplasty and PLIF L2-L5 and redo arthrodesis L2-S1 by Dr. Arnoldo Morale on 8/22. 1/2 blood cultures positive for micrococcus and felt to be contaminant. Dr. Megan Salon consulted for input and recommended discontinuation of antibiotics.   He continued to have issues with lethargy needing max directional cues to follow commands/tasks. He continues to be  at high risk for falls due to poor safety with impairments in mobility as well as difficulty completing ADL tasks. CIR recommended for follow up therapy.     Hospital Course: Derek [name] was admitted to rehab 12/14/2016 for inpatient therapies to consist of PT and OT at least three hours five days a week. Past admission physiatrist, therapy team and rehab RN have worked together to provide customized collaborative inpatient rehab.  ABLA has been monitored and is resolving. Reactive leucocytosis has resolved and no signs of infection noted. Back incision is intact and healing without signs of infection. Rash on back has resolved with use of steroids. He continued to be internally distracted by pain and itching alternating with bouts of lethargy. Oxycodone was changed to hydrocodone and ultram was scheduled qid.  Low dose gabapentin was resumed and lidocaine patch was added additionally to help with symptoms. Lethargy has resolved and he has had issues  with nicotine withdrawal therefore nicotine patch was ordered to help manage symptoms. Blood pressures have been monitored bid basis and lisinopril was decreased to 5 mg daily to avoid hypotension. Blood pressures have been controlled on current regimen. He has progressed to supervision level and will continue to receive follow up [place] [place] [facility] [place] [facility] [place] after discharge.    Rehab course: During patient's stay in rehab brief  team conference was held to discuss patient's progress, set goals and discuss barriers to discharge. At admission, patient required mod assist with mobility and basic self care tasks. He has had improvement in activity tolerance, balance, postural control, as well as ability to compensate for deficits. He is able to complete ADL tasks with extra time and supervision. He is able to transfer with supervision and is ambulating 300+ feet with RW.  Wife has been present for multiple therapy sessions and family education was completed regarding all aspects of care.     Disposition: 01-Home or Self Care  Diet:  Regular  Special Instructions: 1. No bending, twisting, arching or driving. 2. Timmothy Sours and Doff brace when in bed.    Discharge Instructions    Ambulatory referral to Physical Medicine Rehab    Complete by:  As directed    1-2 weeks transitional care appt     Allergies as of 12/21/2016      Reactions   Prednisone Other (See Comments)   Wife report it makes him "crazy as a bat".      Medication List    STOP taking these medications   diazepam 10 MG tablet Commonly known as:  VALIUM   docusate sodium 100 MG capsule Commonly known as:  COLACE   gabapentin 600 MG tablet Commonly known as:  NEURONTIN Replaced by:  gabapentin 100 MG capsule   Oxycodone HCl 10 MG Tabs     TAKE these medications   acetaminophen 325 MG tablet Commonly known as:  TYLENOL Take 1-2 tablets (325-650 mg total) by mouth every 4 (four) hours as needed for mild pain.   ferrous sulfate 325 (65 FE) MG tablet Take 1 tablet (325 mg total) by mouth 2 (two) times daily with a meal.   gabapentin 100 MG capsule Commonly known as:  NEURONTIN Take 2 capsules (200 mg total) by mouth at bedtime. Replaces:  gabapentin 600 MG tablet   HYDROcodone-acetaminophen 7.5-325 MG tablet--Rx # 5 pills  Commonly known as:  NORCO Take 0.5-1 tablets by mouth daily as needed for severe pain.   lactulose 10 GM/15ML  solution Commonly known as:  CHRONULAC Take 15-30 mLs by mouth at bedtime as needed for moderate constipation. As needed   lidocaine 5 % Commonly known as:  LIDODERM Place 1 patch onto the skin daily. Apply at 7 am and remove at 7 pm daily.  This is available over the counter.   lisinopril 5 MG tablet Commonly known as:  PRINIVIL,ZESTRIL Take 1 tablet (5 mg total) by mouth daily before breakfast. What changed:  medication strength  how much to take   MUSCLE RUB 10-15 % Crea Apply 1 application topically as needed for muscle pain.   nicotine 21 mg/24hr patch Commonly known as:  NICODERM CQ - dosed in mg/24 hours Place 1 patch (21 mg total) onto the skin daily. Use for a month. Then taper to 14 mg for 4 weeks. Then 7 mg for 4 weeks. Then stop   senna 8.6 MG Tabs tablet Commonly known as:  SENOKOT Take 1 tablet (  8.6 mg total) by mouth at bedtime.   traMADol 50 MG tablet--Rx # 120 pills  Commonly known as:  ULTRAM Take 1 tablet (50 mg total) by mouth 4 (four) times daily -  with meals and at bedtime.            Follow-up Information    Jamse Arn, MD Follow up.   Specialty:  Physical Medicine and Rehabilitation Why:  office will call you for follow up appointment Contact information: [address] [place] [address] [phone]        Newman Pies, MD. Call.   Specialty:  Neurosurgery Why:  for post op appointment. Contact information: [address] [place] [address] [phone]        Redmond School, MD Follow up.   Specialty:  Internal Medicine Why:  Awaiting return call to set up follow u appointment--will need post hospital follow up in 1-2 weeks.  Contact information: [address] [place] [address] [phone]           Signed: Bary Leriche 12/21/2016, 5:22 PM

## 2016-12-21 NOTE — Progress Notes (Signed)
[place] PHYSICAL MEDICINE & REHABILITATION     PROGRESS NOTE  Subjective/Complaints:  Pt seen laying in bed this AM.  He slept well overnight. He is ready for discharge.  ROS: Denies CP, SOB, N/V/D.  Objective: Vital Signs: Blood pressure (!) 117/54, pulse 96, temperature 98.1 F (36.7 C), temperature source Oral, resp. rate 18, height 5\' 9"  (1.753 m), weight 85.3 kg (188 lb), SpO2 96 %. No results found. No results for input(s): WBC, HGB, HCT, PLT in the last 72 hours. No results for input(s): NA, K, CL, GLUCOSE, BUN, CREATININE, CALCIUM in the last 72 hours.  Invalid input(s): CO CBG (last 3)  No results for input(s): GLUCAP in the last 72 hours.  Wt Readings from Last 3 Encounters:  12/21/16 85.3 kg (188 lb)  12/09/16 98.8 kg (217 lb 13 oz)  09/23/16 99.3 kg (219 lb)    Physical Exam:  BP (!) 117/54   Pulse 96   Temp 98.1 F (36.7 C) (Oral)   Resp 18   Ht 5\' 9"  (1.753 m)   Wt 85.3 kg (188 lb)   SpO2 96%   BMI 27.76 kg/m  Constitutional: He appears well-developed and well-nourished. He appears ill. No distress.  HENT: Normocephalic and atraumatic.  Eyes: EOM are normal. No discharge.  Cardiovascular: RRR.  No JVD. Respiratory: Effort normal and breath sounds normal.  GI: Soft. Bowel sounds are normal. Musculoskeletal: He exhibits no edema or tenderness.  Neurological: He is alert and oriented x2, continues to improve Motor: 4+/5 grossly throughout.  Skin: Skin is warm and dry. He is not diaphoretic.  Back incision c/d/i  Psychiatric: His mood appears anxious. He expresses impulsivity.   Assessment/Plan: 1. Functional deficits secondary to lumbar chance fractures s/p PLIF and kyphoplasty which require 3+ hours per day of interdisciplinary therapy in a comprehensive inpatient rehab setting. Physiatrist is providing close team supervision and 24 hour management of active medical problems listed below. Physiatrist and rehab team continue to assess barriers to  discharge/monitor patient progress toward functional and medical goals.  Function:  Bathing Bathing position Bathing activity did not occur:  (simulated) Position:  (bedside chair)  Bathing parts Body parts bathed by patient: Right arm, Left arm, Chest, Abdomen, Front perineal area, Right upper leg, Left upper leg, Right lower leg, Left lower leg, Buttocks, Back Body parts bathed by helper: Back  Bathing assist Assist Level: Supervision or verbal cues      Upper Body Dressing/Undressing Upper body dressing   What is the patient wearing?: Pull over shirt/dress     Pull over shirt/dress - Perfomed by patient: Thread/unthread right sleeve, Thread/unthread left sleeve, Put head through opening, Pull shirt over trunk       Orthosis activity level: Performed by helper (wife)  Upper body assist Assist Level: More than reasonable time, Set up ((set up for orthosis))      Lower Body Dressing/Undressing Lower body dressing   What is the patient wearing?: Non-skid slipper socks, Pants, Shoes     Pants- Performed by patient: Thread/unthread right pants leg, Thread/unthread left pants leg, Pull pants up/down Pants- Performed by helper: Thread/unthread left pants leg, Thread/unthread right pants leg Non-skid slipper socks- Performed by patient: Don/doff left sock, Don/doff right sock (sock aide) Non-skid slipper socks- Performed by helper: Don/doff right sock, Don/doff left sock     Shoes - Performed by patient: Don/doff right shoe, Don/doff left shoe (slip on)       TED Hose - Performed by patient: Don/doff right  TED hose, Don/doff left TED hose TED Hose - Performed by helper: Don/doff right TED hose, Don/doff left TED hose  Lower body assist Assist for lower body dressing: Supervision or verbal cues      Toileting Toileting   Toileting steps completed by patient: Adjust clothing prior to toileting   Toileting Assistive Devices: Grab bar or rail  Toileting assist Assist level:  More than reasonable time   Transfers Chair/bed transfer   Chair/bed transfer method: Ambulatory Chair/bed transfer assist level: Supervision or verbal cues Chair/bed transfer assistive device: Armrests, Medical sales representative     Max distance: 150 ft Assist level: Supervision or verbal cues   Wheelchair          Cognition Comprehension Comprehension assist level: Understands basic 75 - 89% of the time/ requires cueing 10 - 24% of the time  Expression Expression assist level: Expresses basic 75 - 89% of the time/requires cueing 10 - 24% of the time. Needs helper to occlude trach/needs to repeat words.  Social Interaction Social Interaction assist level: Interacts appropriately 50 - 74% of the time - May be physically or verbally inappropriate.  Problem Solving Problem solving assist level: Solves basic 50 - 74% of the time/requires cueing 25 - 49% of the time  Memory Memory assist level: Recognizes or recalls 75 - 89% of the time/requires cueing 10 - 24% of the time    Medical Problem List and Plan: 1.  Cognitive deficits, weakness, poor activity toleratnace secondary to lumbar chance fractures s/p PLIF and kyphoplasty.  D/c today  Will see patient for transitional care management in 1-2 weeks 2.  DVT Prophylaxis/Anticoagulation: Mechanical: Sequential compression devices, below knee Bilateral lower extremities 3. Pain Management: Limit neurosedating meds--decreased oxycodone to 5 mg prn severe pain.  Resumed low dose Neurontin at nights to help with neuropathy and sleep.   Lidoderm patch ordered for back on 8/29  Improved 4. Mood: LCSW to follow for evaluation and support.  5. Neuropsych: This patient is not capable of making decisions on his own behalf. 6. Skin/Wound Care: Routine wound care.  7. Fluids/Electrolytes/Nutrition: Monitor I/Os. Monitor I/Os. Maintain adequate nutritional and hydration sources. Offer supplements between meals during the day and family  encouraged to bring food from home 8. HTN: Monitor BP bid-   Lisinopril decreased to 5 mg daily.   Controlled on 9/3 9. ABLA: Post surgery. Will monitor for now.   Hb 9.0 on 8/28  Cont to monitor 10. Lethargy: Decreased Oxycodone to 5 mg and change robaxin to po prn.   11. Hypokalemia: Resolved with supplement--encourage intake   K+ 4.1 on 8/28  Cont to monitor 12. Vitamin B 12 deficiency:  Vitamin B12 - 104. He was started on B12 injections on 8/24--continue weekly X 4 doses.  13. Neuropathy: Was on Neurontin 600 mg tid at home but had sedating SE per wife. Resumed low dose at nights.  14. COPD: Currently is 2 PPD smoker but asymptomatic/no issues in the past per wife.   Bupropion d/ced and nicotine patch started on 8/31 15. Hypoalbuminemia  Supplement initiated 8/28  LOS (Days) 7 A FACE TO FACE EVALUATION WAS PERFORMED  Derek [name] Derek [name] 12/21/2016 8:03 AM

## 2016-12-22 ENCOUNTER — Telehealth: Payer: Self-pay | Admitting: *Deleted

## 2016-12-22 DIAGNOSIS — E538 Deficiency of other specified B group vitamins: Secondary | ICD-10-CM | POA: Diagnosis not present

## 2016-12-22 DIAGNOSIS — Z79891 Long term (current) use of opiate analgesic: Secondary | ICD-10-CM | POA: Diagnosis not present

## 2016-12-22 DIAGNOSIS — I739 Peripheral vascular disease, unspecified: Secondary | ICD-10-CM | POA: Diagnosis not present

## 2016-12-22 DIAGNOSIS — B9689 Other specified bacterial agents as the cause of diseases classified elsewhere: Secondary | ICD-10-CM | POA: Diagnosis not present

## 2016-12-22 DIAGNOSIS — M48062 Spinal stenosis, lumbar region with neurogenic claudication: Secondary | ICD-10-CM | POA: Diagnosis not present

## 2016-12-22 DIAGNOSIS — D649 Anemia, unspecified: Secondary | ICD-10-CM | POA: Diagnosis not present

## 2016-12-22 DIAGNOSIS — A419 Sepsis, unspecified organism: Secondary | ICD-10-CM | POA: Diagnosis not present

## 2016-12-22 DIAGNOSIS — J449 Chronic obstructive pulmonary disease, unspecified: Secondary | ICD-10-CM | POA: Diagnosis not present

## 2016-12-22 DIAGNOSIS — S32010G Wedge compression fracture of first lumbar vertebra, subsequent encounter for fracture with delayed healing: Secondary | ICD-10-CM | POA: Diagnosis not present

## 2016-12-22 DIAGNOSIS — I10 Essential (primary) hypertension: Secondary | ICD-10-CM | POA: Diagnosis not present

## 2016-12-22 DIAGNOSIS — T814XXD Infection following a procedure, subsequent encounter: Secondary | ICD-10-CM | POA: Diagnosis not present

## 2016-12-22 DIAGNOSIS — Z9181 History of falling: Secondary | ICD-10-CM | POA: Diagnosis not present

## 2016-12-22 NOTE — Telephone Encounter (Signed)
Transitional care call completed, Appointment confirmed, Address confirmed, New patient packet mailed  Transitional Care Questions   Questions for our staff to ask patients on Transitional care 48 hour phone call:   1. Are you/is patient experiencing any problems since coming home? Patient was struggling with appetite and confusion/disorientation following discharge.  Wife says he is much improved today. Are there any questions regarding any aspect of care?  No  2. Are there any questions regarding medications administration/dosing? No Are meds being taken as prescribed? No Patient should review meds with caller to confirm   3. Have there been any falls?  No  4. Has Home Health been to the house and/or have they contacted you?  Yes, OT and PT has visited the houseIf not, have you tried to contact them? Can we help you contact them?   5. Are bowels and bladder emptying properly?  Yes Are there any unexpected incontinence issues? If applicable, is patient following bowel/bladder programs?   6. Any fevers, problems with breathing, unexpected pain?  No,  No, back pain, nothing else unexpected  7. Are there any skin problems or new areas of breakdown? Last report, everything is healing fine  8. Has the patient/family member arranged specialty MD follow up (ie cardiology/neurology/renal/surgical/etc)? Yes Can we help arrange?   9. Does the patient need any other services or support that we can help arrange? No  10. Are caregivers following through as expected in assisting the patient? Yes  11. Has the patient quit smoking, drinking alcohol, or using drugs as recommended?  Still smoking, no drinking or illicit drug use

## 2016-12-24 DIAGNOSIS — I10 Essential (primary) hypertension: Secondary | ICD-10-CM | POA: Diagnosis not present

## 2016-12-24 DIAGNOSIS — B9689 Other specified bacterial agents as the cause of diseases classified elsewhere: Secondary | ICD-10-CM | POA: Diagnosis not present

## 2016-12-24 DIAGNOSIS — A419 Sepsis, unspecified organism: Secondary | ICD-10-CM | POA: Diagnosis not present

## 2016-12-24 DIAGNOSIS — S32010G Wedge compression fracture of first lumbar vertebra, subsequent encounter for fracture with delayed healing: Secondary | ICD-10-CM | POA: Diagnosis not present

## 2016-12-24 DIAGNOSIS — T814XXD Infection following a procedure, subsequent encounter: Secondary | ICD-10-CM | POA: Diagnosis not present

## 2016-12-24 DIAGNOSIS — E538 Deficiency of other specified B group vitamins: Secondary | ICD-10-CM | POA: Diagnosis not present

## 2016-12-24 DIAGNOSIS — D649 Anemia, unspecified: Secondary | ICD-10-CM | POA: Diagnosis not present

## 2016-12-24 DIAGNOSIS — I739 Peripheral vascular disease, unspecified: Secondary | ICD-10-CM | POA: Diagnosis not present

## 2016-12-24 DIAGNOSIS — Z9181 History of falling: Secondary | ICD-10-CM | POA: Diagnosis not present

## 2016-12-24 DIAGNOSIS — J449 Chronic obstructive pulmonary disease, unspecified: Secondary | ICD-10-CM | POA: Diagnosis not present

## 2016-12-24 DIAGNOSIS — Z79891 Long term (current) use of opiate analgesic: Secondary | ICD-10-CM | POA: Diagnosis not present

## 2016-12-24 DIAGNOSIS — M48062 Spinal stenosis, lumbar region with neurogenic claudication: Secondary | ICD-10-CM | POA: Diagnosis not present

## 2016-12-31 DIAGNOSIS — Z710 Person encountering health services to consult on behalf of another person: Secondary | ICD-10-CM | POA: Diagnosis not present

## 2017-01-01 ENCOUNTER — Encounter: Payer: PPO | Admitting: Physical Medicine & Rehabilitation

## 2017-01-06 DIAGNOSIS — B9689 Other specified bacterial agents as the cause of diseases classified elsewhere: Secondary | ICD-10-CM | POA: Diagnosis not present

## 2017-01-06 DIAGNOSIS — J449 Chronic obstructive pulmonary disease, unspecified: Secondary | ICD-10-CM | POA: Diagnosis not present

## 2017-01-06 DIAGNOSIS — M48062 Spinal stenosis, lumbar region with neurogenic claudication: Secondary | ICD-10-CM | POA: Diagnosis not present

## 2017-01-06 DIAGNOSIS — A419 Sepsis, unspecified organism: Secondary | ICD-10-CM | POA: Diagnosis not present

## 2017-01-06 DIAGNOSIS — E538 Deficiency of other specified B group vitamins: Secondary | ICD-10-CM | POA: Diagnosis not present

## 2017-01-06 DIAGNOSIS — T814XXD Infection following a procedure, subsequent encounter: Secondary | ICD-10-CM | POA: Diagnosis not present

## 2017-01-06 DIAGNOSIS — D649 Anemia, unspecified: Secondary | ICD-10-CM | POA: Diagnosis not present

## 2017-01-06 DIAGNOSIS — Z79891 Long term (current) use of opiate analgesic: Secondary | ICD-10-CM | POA: Diagnosis not present

## 2017-01-06 DIAGNOSIS — Z9181 History of falling: Secondary | ICD-10-CM | POA: Diagnosis not present

## 2017-01-06 DIAGNOSIS — I10 Essential (primary) hypertension: Secondary | ICD-10-CM | POA: Diagnosis not present

## 2017-01-06 DIAGNOSIS — S32010G Wedge compression fracture of first lumbar vertebra, subsequent encounter for fracture with delayed healing: Secondary | ICD-10-CM | POA: Diagnosis not present

## 2017-01-06 DIAGNOSIS — I739 Peripheral vascular disease, unspecified: Secondary | ICD-10-CM | POA: Diagnosis not present

## 2017-02-14 ENCOUNTER — Encounter (HOSPITAL_COMMUNITY): Payer: Self-pay | Admitting: Emergency Medicine

## 2017-02-14 ENCOUNTER — Emergency Department (HOSPITAL_COMMUNITY)
Admission: EM | Admit: 2017-02-14 | Discharge: 2017-02-17 | Disposition: A | Discharge status: Other Acute Inpatient Hospital | Payer: PPO | Attending: Emergency Medicine | Admitting: Emergency Medicine

## 2017-02-14 ENCOUNTER — Emergency Department (HOSPITAL_COMMUNITY): Payer: PPO

## 2017-02-14 DIAGNOSIS — R4182 Altered mental status, unspecified: Secondary | ICD-10-CM | POA: Diagnosis present

## 2017-02-14 DIAGNOSIS — Z85828 Personal history of other malignant neoplasm of skin: Secondary | ICD-10-CM | POA: Insufficient documentation

## 2017-02-14 DIAGNOSIS — J449 Chronic obstructive pulmonary disease, unspecified: Secondary | ICD-10-CM | POA: Insufficient documentation

## 2017-02-14 DIAGNOSIS — Z046 Encounter for general psychiatric examination, requested by authority: Secondary | ICD-10-CM | POA: Diagnosis not present

## 2017-02-14 DIAGNOSIS — R0682 Tachypnea, not elsewhere classified: Secondary | ICD-10-CM | POA: Diagnosis not present

## 2017-02-14 DIAGNOSIS — Z96641 Presence of right artificial hip joint: Secondary | ICD-10-CM | POA: Diagnosis not present

## 2017-02-14 DIAGNOSIS — F17210 Nicotine dependence, cigarettes, uncomplicated: Secondary | ICD-10-CM | POA: Insufficient documentation

## 2017-02-14 DIAGNOSIS — Z79899 Other long term (current) drug therapy: Secondary | ICD-10-CM | POA: Insufficient documentation

## 2017-02-14 DIAGNOSIS — R51 Headache: Secondary | ICD-10-CM | POA: Diagnosis not present

## 2017-02-14 DIAGNOSIS — R4689 Other symptoms and signs involving appearance and behavior: Secondary | ICD-10-CM | POA: Insufficient documentation

## 2017-02-14 DIAGNOSIS — I10 Essential (primary) hypertension: Secondary | ICD-10-CM | POA: Diagnosis not present

## 2017-02-14 DIAGNOSIS — R4020 Unspecified coma: Secondary | ICD-10-CM | POA: Diagnosis not present

## 2017-02-14 DIAGNOSIS — F919 Conduct disorder, unspecified: Secondary | ICD-10-CM | POA: Diagnosis not present

## 2017-02-14 LAB — I-STAT CG4 LACTIC ACID, ED: Lactic Acid, Venous: 1.65 mmol/L (ref 0.5–1.9)

## 2017-02-14 LAB — CBC WITH DIFFERENTIAL/PLATELET
Basophils Absolute: 0 10*3/uL (ref 0.0–0.1)
Basophils Relative: 0 %
Eosinophils Absolute: 0.3 10*3/uL (ref 0.0–0.7)
Eosinophils Relative: 3 %
HCT: 39.1 % (ref 39.0–52.0)
Hemoglobin: 12.8 g/dL — ABNORMAL LOW (ref 13.0–17.0)
Lymphocytes Relative: 28 %
Lymphs Abs: 3.5 10*3/uL (ref 0.7–4.0)
MCH: 29.4 pg (ref 26.0–34.0)
MCHC: 32.7 g/dL (ref 30.0–36.0)
MCV: 89.9 fL (ref 78.0–100.0)
Monocytes Absolute: 1.1 10*3/uL — ABNORMAL HIGH (ref 0.1–1.0)
Monocytes Relative: 9 %
Neutro Abs: 7.5 10*3/uL (ref 1.7–7.7)
Neutrophils Relative %: 60 %
Platelets: 399 10*3/uL (ref 150–400)
RBC: 4.35 MIL/uL (ref 4.22–5.81)
RDW: 14.6 % (ref 11.5–15.5)
WBC: 12.5 10*3/uL — ABNORMAL HIGH (ref 4.0–10.5)

## 2017-02-14 LAB — BASIC METABOLIC PANEL
Anion gap: 11 (ref 5–15)
BUN: 15 mg/dL (ref 6–20)
CO2: 24 mmol/L (ref 22–32)
Calcium: 9.5 mg/dL (ref 8.9–10.3)
Chloride: 99 mmol/L — ABNORMAL LOW (ref 101–111)
Creatinine, Ser: 0.83 mg/dL (ref 0.61–1.24)
GFR calc Af Amer: >60 mL/min (ref >60)
GFR calc non Af Amer: >60 mL/min (ref >60)
Glucose, Bld: 140 mg/dL — ABNORMAL HIGH (ref 65–99)
Potassium: 4.1 mmol/L (ref 3.5–5.1)
Sodium: 134 mmol/L — ABNORMAL LOW (ref 135–145)

## 2017-02-14 LAB — I-STAT CHEM 8, ED
BUN: 15 mg/dL (ref 6–20)
CALCIUM ION: 1.22 mmol/L (ref 1.15–1.40)
CREATININE: 0.8 mg/dL (ref 0.61–1.24)
Chloride: 99 mmol/L — ABNORMAL LOW (ref 101–111)
GLUCOSE: 135 mg/dL — AB (ref 65–99)
HCT: 40 % (ref 39.0–52.0)
HEMOGLOBIN: 13.6 g/dL (ref 13.0–17.0)
Potassium: 4.2 mmol/L (ref 3.5–5.1)
Sodium: 136 mmol/L (ref 135–145)
TCO2: 25 mmol/L (ref 22–32)

## 2017-02-14 LAB — URINALYSIS, ROUTINE W REFLEX MICROSCOPIC
Bilirubin Urine: NEGATIVE
Glucose, UA: NEGATIVE mg/dL
Hgb urine dipstick: NEGATIVE
Ketones, ur: NEGATIVE mg/dL
Leukocytes, UA: NEGATIVE
Nitrite: NEGATIVE
Protein, ur: NEGATIVE mg/dL
Specific Gravity, Urine: 1.006 (ref 1.005–1.030)
pH: 6 (ref 5.0–8.0)

## 2017-02-14 LAB — CBG MONITORING, ED: GLUCOSE-CAPILLARY: 153 mg/dL — AB (ref 65–99)

## 2017-02-14 MED ORDER — SODIUM CHLORIDE 0.9 % IV SOLN: INTRAVENOUS | Status: DC

## 2017-02-14 NOTE — ED Provider Notes (Signed)
[facility] EMERGENCY DEPARTMENT Provider Note   CSN: [id number] Arrival date & time: 02/14/17  2101     History   Chief Complaint Chief Complaint  Patient presents with  . Altered Mental Status    HPI Derek [name] is a 71 y.o. male.  HPI   71 year old male presenting with wife for evaluation of change in mental status.  Onset shortly before arrival.  Patient was found sitting on the toilet in his home repeatedly saying "Oh God."  He seemed to be in his usual state of health this prior to this.  While family was talking to mom, he was able to get up himself and go to the bedroom.  He subsequently walked back to the toilet, sat down and again repeating "Oh God."   This is essentially how he presented to the emergency room.  On my assessment he is laying in bed with his eyes closed.  He would not follow commands. Kept saying "Oh God" nonstop.    No trauma that wife is aware of.  No known ingestion.  Wife reports that he had a difficult hospitalization in September that "left him traumatized."   Apparently he was agitated at times during the hospitalization requiring physical restraint. She has not seen him act like this previously though  Past Medical History:  Diagnosis Date  . Adenomatous polyps    remote past, on 5 year surveillance  . Arthritis   . Bell's palsy 40+yrs ago  . Cancer ([place])    skin  . COPD (chronic obstructive pulmonary disease) (HCC)    emphysema, pt. states he is not aware of this  . Diverticulitis   . Dyspnea    pt. denies  . Enlarged prostate   . Flat affect   . Headache(784.0)    having recently  . Hip pain 11/2016  . Hypertension   . Peripheral vascular disease ([place])   . Pneumonia   . Sepsis ([place]) 11/2016    Patient Active Problem List   Diagnosis Date Noted  . Essential hypertension   . Postoperative pain   . Nicotine withdrawal   . S/P lumbar fusion   . Tobacco abuse   . Hypoalbuminemia due to protein-calorie malnutrition ([place])     . Vitamin B 12 deficiency   . Lethargic   . Other fracture of fourth lumbar vertebra, initial encounter for closed fracture ([place]) 12/14/2016  . Post-operative pain   . Benign essential HTN   . Acute blood loss anemia   . Lethargy   . B12 deficiency 12/12/2016  . Positive blood culture 12/10/2016  . Status post lumbar spinal fusion 12/10/2016  . Hyponatremia 12/08/2016  . Hypokalemia 12/08/2016  . Lactic acidosis 12/08/2016  . Normocytic anemia 12/08/2016  . Anxiety 12/08/2016  . COPD (chronic obstructive pulmonary disease) ([place]) 12/08/2016  . Back pain 12/08/2016  . Compression fracture of lumbar vertebra ([place]) 12/08/2016  . Thrombocytosis ([place]) 12/08/2016  . Sepsis ([place]) 12/06/2016  . Altered mental status 12/06/2016  . Lumbar stenosis with neurogenic claudication 09/23/2016  . Avascular necrosis of bone of right hip ([place]) 11/20/2015  . OA (osteoarthritis) of hip 11/20/2015  . Hepatomegaly 12/15/2011  . Adenomatous polyps 12/15/2011  . Atherosclerosis of native arteries of the extremities with intermittent claudication 09/15/2011  . Pain in limb 09/15/2011    Past Surgical History:  Procedure Laterality Date  . BACK SURGERY  09/2016  . COLONOSCOPY  07/30/2005   Internal hemorrhoids; otherwise, normal rectum/ Left-sided diverticula  . COLONOSCOPY  01/11/2012   Procedure: COLONOSCOPY;  Surgeon: Daneil Dolin, MD;  [facility];  Service: Endoscopy;  Laterality: N/A;  1:15  . ILIAC ARTERY STENT  2006   Left CIA stenting  . KYPHOPLASTY N/A 12/09/2016   Procedure: Vertebroplasty;  Surgeon: Newman Pies, MD;  Location: [facility];  Service: Neurosurgery;  Laterality: N/A;  . ROTATOR CUFF REPAIR Right    3 different surgeries  . SINUS ENDO W/FUSION Left 08/18/2013   Procedure: ENDOSCOPIC SINUS SURGERY WITH FUSION NAVIGATION;  Surgeon: Melissa Montane, MD;  Location: [facility];  Service: ENT;  Laterality: Left;  . SINUS ENDO W/FUSION Left 12/21/2013   Procedure: ENDOSCOPIC SINUS  SURGERY WITH FUSION NAVIGATION;  Surgeon: Melissa Montane, MD;  Location: [facility];  Service: ENT;  Laterality: Left;  ESS with fusion left frontal sinus with Rain Stent  . SPINE SURGERY     lumbar & cervical spine surgeries X 8   . TOTAL HIP ARTHROPLASTY Right 11/20/2015   Procedure: RIGHT TOTAL HIP ARTHROPLASTY ANTERIOR APPROACH;  Surgeon: Gaynelle Arabian, MD;  Location: [facility];  Service: Orthopedics;  Laterality: Right;  Marland Kitchen VASECTOMY         Home Medications    Prior to Admission medications   Medication Sig Start Date End Date Taking? Authorizing Provider  acetaminophen (TYLENOL) 325 MG tablet Take 1-2 tablets (325-650 mg total) by mouth every 4 (four) hours as needed for mild pain. 12/21/16   Love, Ivan Anchors, PA-C  ferrous sulfate 325 (65 FE) MG tablet Take 1 tablet (325 mg total) by mouth 2 (two) times daily with a meal. 12/21/16   Love, Ivan Anchors, PA-C  gabapentin (NEURONTIN) 100 MG capsule Take 2 capsules (200 mg total) by mouth at bedtime. 12/21/16   Love, Ivan Anchors, PA-C  HYDROcodone-acetaminophen (NORCO) 7.5-325 MG tablet Take 0.5-1 tablets by mouth daily as needed for severe pain. 12/21/16   Love, Ivan Anchors, PA-C  lactulose (CHRONULAC) 10 GM/15ML solution Take 15-30 mLs by mouth at bedtime as needed for moderate constipation. As needed 07/24/13   [provider]  lidocaine (LIDODERM) 5 % Place 1 patch onto the skin daily. Apply at 7 am and remove at 7 pm daily.  This is available over the counter. 12/21/16   Love, Ivan Anchors, PA-C  lisinopril (PRINIVIL,ZESTRIL) 5 MG tablet Take 1 tablet (5 mg total) by mouth daily before breakfast. 12/22/16   Love, Ivan Anchors, PA-C  Menthol-Methyl Salicylate (MUSCLE RUB) 10-15 % CREA Apply 1 application topically as needed for muscle pain. 12/21/16   Love, Ivan Anchors, PA-C  nicotine (NICODERM CQ - DOSED IN MG/24 HOURS) 21 mg/24hr patch Place 1 patch (21 mg total) onto the skin daily. Use for a month. Then taper to 14 mg for 4 weeks. Then 7 mg for 4 weeks. Then stop 12/22/16    Love, Ivan Anchors, PA-C  senna (SENOKOT) 8.6 MG TABS tablet Take 1 tablet (8.6 mg total) by mouth at bedtime. 12/21/16   Love, Ivan Anchors, PA-C  traMADol (ULTRAM) 50 MG tablet Take 1 tablet (50 mg total) by mouth 4 (four) times daily -  with meals and at bedtime. 12/21/16   Bary Leriche, PA-C    Family History Family History  Problem Relation Age of Onset  . Heart disease Mother        After age 9  . Heart attack Father   . Heart disease Father        Heart Disease before age 13  . Colon  cancer Neg Hx     Social History Social History  Substance Use Topics  . Smoking status: Current Every Day Smoker    Packs/day: 2.00    Years: 52.00    Types: Cigarettes  . Smokeless tobacco: Never Used     Comment: Up to 2 pks per day depending on stress levels.   . Alcohol use No     Allergies   Prednisone   Review of Systems Review of Systems  All systems reviewed and negative, other than as noted in HPI.  Physical Exam Updated Vital Signs BP 139/74   Pulse 96   Temp 98.2 F (36.8 C) (Rectal)   Resp (!) 25   Wt 85.3 kg (188 lb)   SpO2 97%   BMI 27.76 kg/m   Physical Exam  Constitutional: He appears well-developed and well-nourished. No distress.  Laying in bed. Saying "Oh God" over and over. Did switch to "I gotta pee" briefly before again repeating "Oh God."   HENT:  Head: Normocephalic and atraumatic.  Eyes: Conjunctivae are normal. Right eye exhibits no discharge. Left eye exhibits no discharge.  Neck: Neck supple.  Cardiovascular: Normal rate, regular rhythm and normal heart sounds.  Exam reveals no gallop and no friction rub.   No murmur heard. Pulmonary/Chest: Effort normal and breath sounds normal. No respiratory distress.  Abdominal: Soft. He exhibits no distension. There is no tenderness.  Musculoskeletal: He exhibits no edema or tenderness.  Neurological:   He is not following commands. Exam is consistent.  No obvious facial droop.  Hand not touching face when  dropped from above. When eyelids retracted he would briefly make eye contact at times. At times he would keep eyes forcefully closed.  When I would raise his lower extremities from the bed by raising his thighs he would keep the knee fully extended. When repeated the lower leg fell to the bed.  Skin: Skin is warm and dry.  Psychiatric:  Could not assess  Nursing note and vitals reviewed.    ED Treatments / Results  Labs (all labs ordered are listed, but only abnormal results are displayed) Labs Reviewed  CBC WITH DIFFERENTIAL/PLATELET - Abnormal; Notable for the following:       Result Value   WBC 12.5 (*)    Hemoglobin 12.8 (*)    Monocytes Absolute 1.1 (*)    All other components within normal limits  BASIC METABOLIC PANEL - Abnormal; Notable for the following:    Sodium 134 (*)    Chloride 99 (*)    Glucose, Bld 140 (*)    All other components within normal limits  URINALYSIS, ROUTINE W REFLEX MICROSCOPIC - Abnormal; Notable for the following:    Color, Urine STRAW (*)    All other components within normal limits  CBG MONITORING, ED - Abnormal; Notable for the following:    Glucose-Capillary 153 (*)    All other components within normal limits  I-STAT CHEM 8, ED - Abnormal; Notable for the following:    Chloride 99 (*)    Glucose, Bld 135 (*)    All other components within normal limits  I-STAT CG4 LACTIC ACID, ED    EKG  EKG Interpretation None       Radiology Ct Head Wo Contrast  Result Date: 02/14/2017 CLINICAL DATA:  Altered level of consciousness EXAM: CT HEAD WITHOUT CONTRAST TECHNIQUE: Contiguous axial images were obtained from the base of the skull through the vertex without intravenous contrast. COMPARISON:  12/06/2016 FINDINGS: Brain:  No acute territorial infarction, hemorrhage or intracranial mass is visualized. Mild moderate atrophy. Stable ventricle size. Vascular: No hyperdense vessels.  Carotid artery calcification. Skull: Normal. Negative for fracture  or focal lesion. Sinuses/Orbits: Mild mucosal thickening in the ethmoid sinuses. Opacified left frontal sinus. No acute orbital abnormality. Other: None IMPRESSION: Atrophy.  No CT evidence for acute intracranial abnormality. Electronically Signed   By: Donavan Foil M.D.   On: 02/14/2017 23:24    Procedures Procedures (including critical care time)  Medications Ordered in ED Medications  0.9 %  sodium chloride infusion (not administered)     Initial Impression / Assessment and Plan / ED Course  I have reviewed the triage vital signs and the nursing notes.  Pertinent labs & imaging results that were available during my care of the patient were reviewed by me and considered in my medical decision making (see chart for details).     71yM with change in mental status. Mostly laying still in bed and repeating "Oh God" over and over. Did switch to "I gotta pee" birefly. Wife reports that asfter this behavior started he was able to get up from the toilet himself, walk and then sit back down.  I think this is psychiatric. TTS consultation once medically cleared.   Son now at bedside. He says his father has actually been like this for weeks to month+. Has been out of the house in a month until today. Refusing to go to appointments. Not eating. Has been losing weight. Repetitively saying "Oh God." At times "will snap out of it" and seem to function reasonably well and then continue this behavior again.   Final Clinical Impressions(s) / ED Diagnoses   Final diagnoses:  Abnormal behavior    New Prescriptions New Prescriptions   No medications on file     Virgel Manifold, MD 02/15/17 [phone]

## 2017-02-14 NOTE — ED Triage Notes (Signed)
Pt pulled out of car, unresponsive, eyes closed, repeating " oh God"  No reflexes,  Per wife until 7pm tonight he was walking around. MD in the room

## 2017-02-15 ENCOUNTER — Emergency Department (HOSPITAL_COMMUNITY): Payer: PPO

## 2017-02-15 DIAGNOSIS — R0682 Tachypnea, not elsewhere classified: Secondary | ICD-10-CM | POA: Diagnosis not present

## 2017-02-15 MED ORDER — LACTULOSE 10 GM/15ML PO SOLN: 10.0000 g | Freq: Every evening | ORAL | Status: DC | PRN

## 2017-02-15 MED ORDER — ACETAMINOPHEN 500 MG PO TABS
1000.0000 mg | ORAL_TABLET | Freq: Once | ORAL | Status: AC
  Administered 2017-02-15: 1000 mg via ORAL
  Filled 2017-02-15: qty 2

## 2017-02-15 MED ORDER — FERROUS SULFATE 325 (65 FE) MG PO TABS
325.0000 mg | ORAL_TABLET | Freq: Two times a day (BID) | ORAL | Status: DC
  Administered 2017-02-15 – 2017-02-17 (×5): 325 mg via ORAL
  Filled 2017-02-15 (×5): qty 1

## 2017-02-15 MED ORDER — SENNA 8.6 MG PO TABS
1.0000 | ORAL_TABLET | Freq: Every day | ORAL | Status: DC
  Administered 2017-02-16: 8.6 mg via ORAL
  Filled 2017-02-15 (×2): qty 1

## 2017-02-15 MED ORDER — LISINOPRIL 5 MG PO TABS
5.0000 mg | ORAL_TABLET | Freq: Every day | ORAL | Status: DC
  Administered 2017-02-15 – 2017-02-17 (×3): 5 mg via ORAL
  Filled 2017-02-15 (×3): qty 1

## 2017-02-15 MED ORDER — ACETAMINOPHEN 325 MG PO TABS
650.0000 mg | ORAL_TABLET | Freq: Four times a day (QID) | ORAL | Status: DC | PRN
  Administered 2017-02-15 – 2017-02-16 (×3): 650 mg via ORAL
  Filled 2017-02-15 (×3): qty 2

## 2017-02-15 MED ORDER — NICOTINE 21 MG/24HR TD PT24
21.0000 mg | MEDICATED_PATCH | Freq: Once | TRANSDERMAL | Status: AC
  Administered 2017-02-15: 21 mg via TRANSDERMAL
  Filled 2017-02-15: qty 1

## 2017-02-15 MED ORDER — GABAPENTIN 100 MG PO CAPS
200.0000 mg | ORAL_CAPSULE | Freq: Every day | ORAL | Status: DC
  Administered 2017-02-15 – 2017-02-16 (×2): 200 mg via ORAL
  Filled 2017-02-15 (×2): qty 2

## 2017-02-15 NOTE — ED Notes (Signed)
Pt's son called a nurse in room stating pt has pulled out IV; IV found lying in bed, bleeding controlled at this time; pt cleaned up and other IV site wrapped with kerlix

## 2017-02-15 NOTE — ED Provider Notes (Signed)
he presented with altered mental status.  ED workup did not show any acute medical issues.  According to family the patient has had some change in his behavior over the last month or so.  He is repeatedly saying "Oh God."This morning patient is awake and will speak with me.  Patient does not think he was acting inappropriately.  His wife confirms with him that he was.  He is asking to go home.  TTS assess,ent recommends inpatient treatment.   Dorie Rank, MD 02/15/17 [phone]

## 2017-02-15 NOTE — ED Notes (Signed)
Patient transported to X-ray 

## 2017-02-15 NOTE — ED Notes (Signed)
Pt is complaining of headache pain that has gotten worse. Will speak with MD to see if any medication can be given

## 2017-02-15 NOTE — ED Notes (Signed)
Patient is being held for in patient treatment. Patient and family were visibly upset at this information.   Patient's wife asked if she could stay with her husband. AC, Charge Nurse, and Dr. Dorie Rank were agreeable to let the patient's family stay with him despite visiting hours.

## 2017-02-15 NOTE — ED Notes (Signed)
Per Dr. Roderic Palau, pt is able to take his Tylenol early

## 2017-02-15 NOTE — ED Notes (Signed)
TTS machine placed at bedside.   

## 2017-02-15 NOTE — Consult Note (Signed)
Telepsych Consultation   Reason for Consult:  Behavioral Changes  Referring Physician:  EPD Location of Patient: [facility]  Location of Provider: [facility]  Patient Identification: Derek [name] MRN:  [id number] Principal Diagnosis: <principal problem not specified> Diagnosis:   Patient Active Problem List   Diagnosis Date Noted  . Essential hypertension [I10]   . Postoperative pain [G89.18]   . Nicotine withdrawal [F17.203]   . S/P lumbar fusion [Z98.1]   . Tobacco abuse [Z72.0]   . Hypoalbuminemia due to protein-calorie malnutrition ([place]) [E46]   . Vitamin B 12 deficiency [E53.8]   . Lethargic [R53.83]   . Other fracture of fourth lumbar vertebra, initial encounter for closed fracture ([place]) [S32.048A] 12/14/2016  . Post-operative pain [G89.18]   . Benign essential HTN [I10]   . Acute blood loss anemia [D62]   . Lethargy [R53.83]   . B12 deficiency [E53.8] 12/12/2016  . Positive blood culture [R78.81] 12/10/2016  . Status post lumbar spinal fusion [Z98.1] 12/10/2016  . Hyponatremia [E87.1] 12/08/2016  . Hypokalemia [E87.6] 12/08/2016  . Lactic acidosis [E87.2] 12/08/2016  . Normocytic anemia [D64.9] 12/08/2016  . Anxiety [F41.9] 12/08/2016  . COPD (chronic obstructive pulmonary disease) ([place]) [J44.9] 12/08/2016  . Back pain [M54.9] 12/08/2016  . Compression fracture of lumbar vertebra ([place]) [S32.000A] 12/08/2016  . Thrombocytosis ([place]) [D47.3] 12/08/2016  . Sepsis ([place]) [A41.9] 12/06/2016  . Altered mental status [R41.82] 12/06/2016  . Lumbar stenosis with neurogenic claudication [M48.062] 09/23/2016  . Avascular necrosis of bone of right hip ([place]) [M87.051] 11/20/2015  . OA (osteoarthritis) of hip [M16.9] 11/20/2015  . Hepatomegaly [R16.0] 12/15/2011  . Adenomatous polyps [D36.9] 12/15/2011  . Atherosclerosis of native arteries of the extremities with intermittent claudication [I70.219] 09/15/2011  . Pain in limb [M79.609] 09/15/2011    Total Time  spent with patient: 30 minutes  Subjective:   Derek [name] is a 71 y.o. male patient Derek [name] is awake, alert and oriented. Seen via tele-assessment with wife at bedside. Patient continues to reports he is unsure why he was at the hospital.  Patient is currently denying suicidal or homicidal ideations. Passive thoughts of death without plan or intent. Patient has a flat and guarded affect. Support, encouragement and reassurance was provided.   HPI:Per TTS assessment Derek [name] is an 71 y.o. male, who presents voluntary and accompanied by his son to [facility] [place]. Clinician asked the pt if wanted his son present during the assessment. Pt reported, "yea." Clinician asked the pt, "what brought you to the hospital?" Pt reported, "I don't know what happened, I woke up in here, oh God, oh God, oh God." Pt's son reported, the pt was on the toilet and he would not get up, he then feel in his arms and got back in bed. Pt's son reported, the pt has not left the house in a month, he's repetitive; doing and saying things over and over; such as "oh God, oh God." Pt's son reported, the pt is not eating. Pt's son reported, the pt had two major procedures on his back. Pt's son reported, the pt refuses to follow up with his doctor. Pt reported, "I don't mind dying." Pt reported, he did not want to commit suicide but if he were to die, it would be okay with it. Pt reported, sometimes he hears people talking. Pt reported, having access to guns. Pt's son reported, the pt's guns are locked up. Pt denies, SI, HI, self-injurious behaviors, and access to weapons.  Pt denies abuse. Pt reported, smoking two packs of cigarettes, daily. Pt's UDS is positive for barbiturates. Pt denies being linked to OPT resources (medication management and/or counseling.) Pt denied, previous inpatient admissions.   Pt presents quiet/awake under the covers with soft speech. Pt's eye contact was poor. Pt's mood was preoccupied. Pt's affect was  flat. Pt's thought process was circumstantial.  Pt's judgement was impaired. Pt's concentration was fair. Pt's insight was poor. Pt's impulse control was fair. Pt was oriented x2 (city and state.) Pt's son reported, if discharged from [facility] he feels the pt will be safe. Pt reported, if discharged from [facility] he could contract for safety. Pt reported, if inpatient treatment was recommended he would sign-in voluntarily.   Past Psychiatric History:   Risk to Self: Suicidal Ideation: No Suicidal Intent: No Is patient at risk for suicide?: No Suicidal Plan?: No Access to Means: No What has been your use of drugs/alcohol within the last 12 months?: Pt's UDS is positive for benzodiazepines.  How many times?: 0 Other Self Harm Risks: Pt denies.  Triggers for Past Attempts: None known Intentional Self Injurious Behavior: None (Pt denies. ) Risk to Others: Homicidal Ideation: No (Pt denies. ) Thoughts of Harm to Others: No Current Homicidal Intent: No Current Homicidal Plan: No Access to Homicidal Means: No Identified Victim: NA History of harm to others?: No Assessment of Violence: None Noted Violent Behavior Description: NA Does patient have access to weapons?: Yes (Comment) (guns, per son pt's guns are locked. ) Criminal Charges Pending?: No Does patient have a court date: No Prior Inpatient Therapy: Prior Inpatient Therapy: No Prior Therapy Dates: NA Prior Therapy Facilty/Provider(s): NA Reason for Treatment: NA Prior Outpatient Therapy: Prior Outpatient Therapy: No Prior Therapy Dates: NA Prior Therapy Facilty/Provider(s): NA Reason for Treatment: NA Does patient have an ACCT team?: No Does patient have Intensive In-House Services?  : No Does patient have [facility] services? : No Does patient have P4CC services?: No  Past Medical History:  Past Medical History:  Diagnosis Date  . Adenomatous polyps    remote past, on 5 year surveillance  . Arthritis   . Bell's palsy 40+yrs ago  .  Cancer ([place])    skin  . COPD (chronic obstructive pulmonary disease) (H[facility])    emphysema, pt. states he is not aware of this  . Diverticulitis   . Dyspnea    pt. denies  . Enlarged prostate   . Flat affect   . Headache(784.0)    having recently  . Hip pain 11/2016  . Hypertension   . Peripheral vascular disease ([place])   . Pneumonia   . Sepsis ([place]) 11/2016    Past Surgical History:  Procedure Laterality Date  . BACK SURGERY  09/2016  . COLONOSCOPY  07/30/2005   Internal hemorrhoids; otherwise, normal rectum/ Left-sided diverticula  . COLONOSCOPY  01/11/2012   Procedure: COLONOSCOPY;  Surgeon: Daneil Dolin, MD;  [facility];  Service: Endoscopy;  Laterality: N/A;  1:15  . ILIAC ARTERY STENT  2006   Left CIA stenting  . KYPHOPLASTY N/A 12/09/2016   Procedure: Vertebroplasty;  Surgeon: Newman Pies, MD;  Location: [facility];  Service: Neurosurgery;  Laterality: N/A;  . ROTATOR CUFF REPAIR Right    3 different surgeries  . SINUS ENDO W/FUSION Left 08/18/2013   Procedure: ENDOSCOPIC SINUS SURGERY WITH FUSION NAVIGATION;  Surgeon: Melissa Montane, MD;  Location: [facility];  Service: ENT;  Laterality: Left;  . SINUS ENDO W/FUSION Left 12/21/2013  Procedure: ENDOSCOPIC SINUS SURGERY WITH FUSION NAVIGATION;  Surgeon: Melissa Montane, MD;  Location: [facility];  Service: ENT;  Laterality: Left;  ESS with fusion left frontal sinus with Rain Stent  . SPINE SURGERY     lumbar & cervical spine surgeries X 8   . TOTAL HIP ARTHROPLASTY Right 11/20/2015   Procedure: RIGHT TOTAL HIP ARTHROPLASTY ANTERIOR APPROACH;  Surgeon: Gaynelle Arabian, MD;  Location: [facility];  Service: Orthopedics;  Laterality: Right;  Marland Kitchen VASECTOMY     Family History:  Family History  Problem Relation Age of Onset  . Heart disease Mother        After age 51  . Heart attack Father   . Heart disease Father        Heart Disease before age 10  . Colon cancer Neg Hx    Family Psychiatric  History:  Social History:  History  Alcohol  Use No     History  Drug Use No    Social History   Social History  . Marital status: Married    Spouse name: N/A  . Number of children: N/A  . Years of education: N/A   Occupational History  . disability Unemployed   Social History Main Topics  . Smoking status: Current Every Day Smoker    Packs/day: 2.00    Years: 52.00    Types: Cigarettes  . Smokeless tobacco: Never Used     Comment: Up to 2 pks per day depending on stress levels.   . Alcohol use No  . Drug use: No  . Sexual activity: Not Asked   Other Topics Concern  . None   Social History Narrative  . None   Additional Social History:    Allergies:   Allergies  Allergen Reactions  . Prednisone Other (See Comments)    Wife report it makes him "crazy as a bat".    Labs:  Results for orders placed or performed during the hospital encounter of 02/14/17 (from the past 48 hour(s))  CBG monitoring, ED     Status: Abnormal   Collection Time: 02/14/17  9:05 PM  Result Value Ref Range   Glucose-Capillary 153 (H) 65 - 99 mg/dL  CBC with Differential     Status: Abnormal   Collection Time: 02/14/17  9:07 PM  Result Value Ref Range   WBC 12.5 (H) 4.0 - 10.5 K/uL   RBC 4.35 4.22 - 5.81 MIL/uL   Hemoglobin 12.8 (L) 13.0 - 17.0 g/dL   HCT 39.1 39.0 - 52.0 %   MCV 89.9 78.0 - 100.0 fL   MCH 29.4 26.0 - 34.0 pg   MCHC 32.7 30.0 - 36.0 g/dL   RDW 14.6 11.5 - 15.5 %   Platelets 399 150 - 400 K/uL   Neutrophils Relative % 60 %   Neutro Abs 7.5 1.7 - 7.7 K/uL   Lymphocytes Relative 28 %   Lymphs Abs 3.5 0.7 - 4.0 K/uL   Monocytes Relative 9 %   Monocytes Absolute 1.1 (H) 0.1 - 1.0 K/uL   Eosinophils Relative 3 %   Eosinophils Absolute 0.3 0.0 - 0.7 K/uL   Basophils Relative 0 %   Basophils Absolute 0.0 0.0 - 0.1 K/uL  Basic metabolic panel     Status: Abnormal   Collection Time: 02/14/17  9:07 PM  Result Value Ref Range   Sodium 134 (L) 135 - 145 mmol/L   Potassium 4.1 3.5 - 5.1 mmol/L   Chloride 99 (L)  101 - 111  mmol/L   CO2 24 22 - 32 mmol/L   Glucose, Bld 140 (H) 65 - 99 mg/dL   BUN 15 6 - 20 mg/dL   Creatinine, Ser 0.83 0.61 - 1.24 mg/dL   Calcium 9.5 8.9 - 10.3 mg/dL   GFR calc non Af Amer >60 >60 mL/min   GFR calc Af Amer >60 >60 mL/min    Comment: (NOTE) The eGFR has been calculated using the CKD EPI equation. This calculation has not been validated in all clinical situations. eGFR's persistently <60 mL/min signify possible Chronic Kidney Disease.    Anion gap 11 5 - 15  I-stat chem 8, ed     Status: Abnormal   Collection Time: 02/14/17  9:14 PM  Result Value Ref Range   Sodium 136 135 - 145 mmol/L   Potassium 4.2 3.5 - 5.1 mmol/L   Chloride 99 (L) 101 - 111 mmol/L   BUN 15 6 - 20 mg/dL   Creatinine, Ser 0.80 0.61 - 1.24 mg/dL   Glucose, Bld 135 (H) 65 - 99 mg/dL   Calcium, Ion 1.22 1.15 - 1.40 mmol/L   TCO2 25 22 - 32 mmol/L   Hemoglobin 13.6 13.0 - 17.0 g/dL   HCT 40.0 39.0 - 52.0 %  I-Stat CG4 Lactic Acid, ED     Status: None   Collection Time: 02/14/17  9:15 PM  Result Value Ref Range   Lactic Acid, Venous 1.65 0.5 - 1.9 mmol/L  Urinalysis, Routine w reflex microscopic     Status: Abnormal   Collection Time: 02/14/17 10:27 PM  Result Value Ref Range   Color, Urine STRAW (A) YELLOW   APPearance CLEAR CLEAR   Specific Gravity, Urine 1.006 1.005 - 1.030   pH 6.0 5.0 - 8.0   Glucose, UA NEGATIVE NEGATIVE mg/dL   Hgb urine dipstick NEGATIVE NEGATIVE   Bilirubin Urine NEGATIVE NEGATIVE   Ketones, ur NEGATIVE NEGATIVE mg/dL   Protein, ur NEGATIVE NEGATIVE mg/dL   Nitrite NEGATIVE NEGATIVE   Leukocytes, UA NEGATIVE NEGATIVE    Medications:  Current Facility-Administered Medications  Medication Dose Route Frequency Provider Last Rate Last Dose  . 0.9 %  sodium chloride infusion   Intravenous Continuous Virgel Manifold, MD      . ferrous sulfate tablet 325 mg  325 mg Oral BID WC Dorie Rank, MD   325 mg at 02/15/17 0942  . gabapentin (NEURONTIN) capsule 200 mg   200 mg Oral QHS Dorie Rank, MD      . lactulose ([place]) 10 GM/15ML solution 10-20 g  10-20 g Oral QHS PRN Dorie Rank, MD      . lisinopril (PRINIVIL,ZESTRIL) tablet 5 mg  5 mg Oral QAC breakfast Dorie Rank, MD   5 mg at 02/15/17 0941  . senna (SENOKOT) tablet 8.6 mg  1 tablet Oral QHS Dorie Rank, MD       Current Outpatient Prescriptions  Medication Sig Dispense Refill  . acetaminophen (TYLENOL) 325 MG tablet Take 1-2 tablets (325-650 mg total) by mouth every 4 (four) hours as needed for mild pain.    . ferrous sulfate 325 (65 FE) MG tablet Take 1 tablet (325 mg total) by mouth 2 (two) times daily with a meal. 60 tablet 0  . gabapentin (NEURONTIN) 100 MG capsule Take 2 capsules (200 mg total) by mouth at bedtime. 60 capsule 0  . HYDROcodone-acetaminophen (NORCO) 7.5-325 MG tablet Take 0.5-1 tablets by mouth daily as needed for severe pain. 5 tablet 0  . lactulose (CHRONULAC) 10  GM/15ML solution Take 15-30 mLs by mouth at bedtime as needed for moderate constipation. As needed    . lidocaine (LIDODERM) 5 % Place 1 patch onto the skin daily. Apply at 7 am and remove at 7 pm daily.  This is available over the counter. 30 patch 0  . lisinopril (PRINIVIL,ZESTRIL) 5 MG tablet Take 1 tablet (5 mg total) by mouth daily before breakfast. 30 tablet 0  . Menthol-Methyl Salicylate (MUSCLE RUB) 10-15 % CREA Apply 1 application topically as needed for muscle pain.  0  . nicotine (NICODERM CQ - DOSED IN MG/24 HOURS) 21 mg/24hr patch Place 1 patch (21 mg total) onto the skin daily. Use for a month. Then taper to 14 mg for 4 weeks. Then 7 mg for 4 weeks. Then stop 28 patch 0  . senna (SENOKOT) 8.6 MG TABS tablet Take 1 tablet (8.6 mg total) by mouth at bedtime. 120 each 0  . traMADol (ULTRAM) 50 MG tablet Take 1 tablet (50 mg total) by mouth 4 (four) times daily -  with meals and at bedtime. 120 tablet 0    Musculoskeletal: Strength & Muscle Tone: UTA Gait & Station: UTA Patient leans: UTA  Psychiatric  Specialty Exam: Physical Exam  Nursing note and vitals reviewed. Constitutional: He is oriented to person, place, and time.  HENT:  Head: Normocephalic.  Neurological: He is alert and oriented to person, place, and time.  Psychiatric: He has a normal mood and affect. His behavior is normal.    ROS  Blood pressure 129/61, pulse 94, temperature 98.2 F (36.8 C), temperature source Rectal, resp. rate (!) 22, weight 85.3 kg (188 lb), SpO2 95 %.Body mass index is 27.76 kg/m.  General Appearance: Guarded  Eye Contact:  Minimal  Speech:  Clear and Coherent  Volume:  Decreased  Mood:  Depressed and Dysphoric  Affect:  Depressed and Flat  Thought Process:  Linear  Orientation:  Other:  person and birthdate  Thought Content:  Hallucinations: None  Suicidal Thoughts:  No  Homicidal Thoughts:  No  Memory:  Immediate;   Fair Recent;   Fair Remote;   Fair  Judgement:  Fair  Insight:  Lacking  Psychomotor Activity:  UTA- seen via teleassessment patient was restign in bed  Concentration:  Concentration: Fair  Recall:  [employer] of Knowledge:  Fair  Language:  Fair  Akathisia:  No  Handed:   AIMS (if indicated):     Assets:  Desire for Improvement Social Support  ADL's:  Intact  Cognition:  WNL  Sleep:       Disposition: Recommend psychiatric Inpatient admission when medically cleared. - TTS to continue seeking geropsychiatry placement   This service was provided via telemedicine using a 2-way, interactive audio and video technology.  Names of all persons participating in this telemedicine service and their role in this encounter. Name: ronda  Role: wife             Derrill [place], NP 02/15/2017 11:53 AM

## 2017-02-15 NOTE — BH Assessment (Addendum)
Tele Assessment Note   Patient Name: Derek [name] MRN: [id number] Referring Physician: Dr. Wilson Singer. Location of Patient: [facility] Location of Provider: [place] is an 71 y.o. male, who presents voluntary and accompanied by his son to [place]. Clinician asked the pt if wanted his son present during the assessment. Pt reported, "yea." Clinician asked the pt, "what brought you to the hospital?" Pt reported, "I don't know what happened, I woke up in here, oh God, oh God, oh God." Pt's son reported, the pt was on the toilet and he would not get up, he then feel in his arms and got back in bed. Pt's son reported, the pt has not left the house in a month, he's repetitive; doing and saying things over and over; such as "oh God, oh God." Pt's son reported, the pt is not eating. Pt's son reported, the pt had two major procedures on his back. Pt's son reported, the pt refuses to follow up with his doctor. Pt reported, "I don't mind dying." Pt reported, he did not want to commit suicide but if he were to die, it would be okay with it. Pt reported, sometimes he hears people talking. Pt reported, having access to guns. Pt's son reported, the pt's guns are locked up. Pt denies, SI, HI, self-injurious behaviors, and access to weapons.   Pt denies abuse. Pt reported, smoking two packs of cigarettes, daily. Pt's UDS is positive for barbiturates. Pt denies being linked to OPT resources (medication management and/or counseling.) Pt denied, previous inpatient admissions.   Pt presents quiet/awake under the covers with soft speech. Pt's eye contact was poor. Pt's mood was preoccupied. Pt's affect was flat. Pt's thought process was circumstantial.  Pt's judgement was impaired. Pt's concentration was fair. Pt's insight was poor. Pt's impulse control was fair. Pt was oriented x2 (city and state.) Pt's son reported, if discharged from [facility] he feels the pt will be safe. Pt reported, if discharged  from [facility] he could contract for safety. Pt reported, if inpatient treatment was recommended he would sign-in voluntarily.    Diagnosis: Deferred.  Past Medical History:  Past Medical History:  Diagnosis Date  . Adenomatous polyps    remote past, on 5 year surveillance  . Arthritis   . Bell's palsy 40+yrs ago  . Cancer ([place])    skin  . COPD (chronic obstructive pulmonary disease) (HCC)    emphysema, pt. states he is not aware of this  . Diverticulitis   . Dyspnea    pt. denies  . Enlarged prostate   . Flat affect   . Headache(784.0)    having recently  . Hip pain 11/2016  . Hypertension   . Peripheral vascular disease ([place])   . Pneumonia   . Sepsis ([place]) 11/2016    Past Surgical History:  Procedure Laterality Date  . BACK SURGERY  09/2016  . COLONOSCOPY  07/30/2005   Internal hemorrhoids; otherwise, normal rectum/ Left-sided diverticula  . COLONOSCOPY  01/11/2012   Procedure: COLONOSCOPY;  Surgeon: Daneil Dolin, MD;  [facility];  Service: Endoscopy;  Laterality: N/A;  1:15  . ILIAC ARTERY STENT  2006   Left CIA stenting  . KYPHOPLASTY N/A 12/09/2016   Procedure: Vertebroplasty;  Surgeon: Newman Pies, MD;  Location: [facility];  Service: Neurosurgery;  Laterality: N/A;  . ROTATOR CUFF REPAIR Right    3 different surgeries  . SINUS ENDO W/FUSION Left 08/18/2013   Procedure: ENDOSCOPIC SINUS  SURGERY WITH FUSION NAVIGATION;  Surgeon: Melissa Montane, MD;  Location: [facility];  Service: ENT;  Laterality: Left;  . SINUS ENDO W/FUSION Left 12/21/2013   Procedure: ENDOSCOPIC SINUS SURGERY WITH FUSION NAVIGATION;  Surgeon: Melissa Montane, MD;  Location: [facility];  Service: ENT;  Laterality: Left;  ESS with fusion left frontal sinus with Rain Stent  . SPINE SURGERY     lumbar & cervical spine surgeries X 8   . TOTAL HIP ARTHROPLASTY Right 11/20/2015   Procedure: RIGHT TOTAL HIP ARTHROPLASTY ANTERIOR APPROACH;  Surgeon: Gaynelle Arabian, MD;  [facility];  Service: Orthopedics;   Laterality: Right;  Marland Kitchen VASECTOMY      Family History:  Family History  Problem Relation Age of Onset  . Heart disease Mother        After age 13  . Heart attack Father   . Heart disease Father        Heart Disease before age 14  . Colon cancer Neg Hx     Social History:  reports that he has been smoking Cigarettes.  He has a 104.00 pack-year smoking history. He has never used smokeless tobacco. He reports that he does not drink alcohol or use drugs.  Additional Social History:  Alcohol / Drug Use Pain Medications: See MAR Prescriptions: See MAR Over the Counter: See MAR History of alcohol / drug use?: Yes (Pt denies. ) Substance #1 Name of Substance 1: Benzodiazepines. 1 - Age of First Use: UTA 1 - Amount (size/oz): Pt's UDS is positive for benzodiazepines.  1 - Frequency: UTA 1 - Duration: UTA 1 - Last Use / Amount: UTA Substance #2 Name of Substance 2: Cigarettes. 2 - Age of First Use: UTA 2 - Amount (size/oz): Pt reported, smoking two packs of cigarettes, daily.  2 - Frequency: UTA 2 - Duration: Ongoing.  2 - Last Use / Amount: Pt reported, daily.   CIWA: CIWA-Ar BP: (!) 150/71 Pulse Rate: 88 COWS:    PATIENT STRENGTHS: (choose at least two) Average or above average intelligence Supportive family/friends  Allergies:  Allergies  Allergen Reactions  . Prednisone Other (See Comments)    Wife report it makes him "crazy as a bat".    Home Medications:  (Not in a hospital admission)  OB/GYN Status:  No LMP for male patient.  General Assessment Data Assessment unable to be completed: Yes Reason for not completing assessment: Pt not participating in assessment due to [facility] of Assessment: [facility] TTS Assessment: In system Is this a Tele or Face-to-Face Assessment?: Face-to-Face Is this an Initial Assessment or a Re-assessment for this encounter?: Initial Assessment Marital status: Married Living Arrangements: Spouse/significant other Can pt return to  current living arrangement?: Yes Admission Status: Voluntary Is patient capable of signing voluntary admission?: Yes Referral Source: Self/Family/Friend Insurance type: [employer].      Crisis Care Plan Living Arrangements: Spouse/significant other Legal Guardian: Other: (Self) Name of Psychiatrist: NA Name of Therapist: NA  Education Status Is patient currently in school?: No Current Grade: NA Highest grade of school patient has completed: 7th grade.  Name of school: NA Contact person: NA  Risk to self with the past 6 months Suicidal Ideation: No Has patient been a risk to self within the past 6 months prior to admission? : No Suicidal Intent: No Has patient had any suicidal intent within the past 6 months prior to admission? : No Is patient at risk for suicide?: No Suicidal Plan?: No Has patient had any suicidal  plan within the past 6 months prior to admission? : No Access to Means: No What has been your use of drugs/alcohol within the last 12 months?: Pt's UDS is positive for benzodiazepines.  Previous Attempts/Gestures: No How many times?: 0 Other Self Harm Risks: Pt denies.  Triggers for Past Attempts: None known Intentional Self Injurious Behavior: None (Pt denies. ) Family Suicide History: Unable to assess Recent stressful life event(s): Other (Comment) (Per pt's son, pt has two major procedures on his back. ) Persecutory voices/beliefs?: No Depression: Yes Depression Symptoms: Feeling angry/irritable (sadness. ) Substance abuse history and/or treatment for substance abuse?: No Suicide prevention information given to non-admitted patients: Not applicable  Risk to Others within the past 6 months Homicidal Ideation: No (Pt denies. ) Does patient have any lifetime risk of violence toward others beyond the six months prior to admission? : No Thoughts of Harm to Others: No Current Homicidal Intent: No Current Homicidal Plan: No Access to Homicidal Means:  No Identified Victim: NA History of harm to others?: No Assessment of Violence: None Noted Violent Behavior Description: NA Does patient have access to weapons?: Yes (Comment) (guns, per son pt's guns are locked. ) Criminal Charges Pending?: No Does patient have a court date: No Is patient on probation?: No  Psychosis Hallucinations: Auditory Delusions: None noted  Mental Status Report Appearance/Hygiene: Other (Comment) (under the covers. ) Eye Contact: Poor Motor Activity: Unremarkable Speech: Soft Level of Consciousness: Quiet/awake Mood: Preoccupied Affect: Flat Anxiety Level: None Thought Processes: Circumstantial Judgement: Impaired Orientation: Other (Comment) (city and state. ) Obsessive Compulsive Thoughts/Behaviors: Unable to Assess  Cognitive Functioning Concentration: Fair Memory: Recent Impaired IQ: Average Insight: Poor Impulse Control: Fair Appetite: Poor Sleep: Unable to Assess Vegetative Symptoms: Unable to Assess  ADLScreening [facility] Assessment Services) Patient's cognitive ability adequate to safely complete daily activities?: No Patient able to express need for assistance with ADLs?: Yes Independently performs ADLs?: No  Prior Inpatient Therapy Prior Inpatient Therapy: No Prior Therapy Dates: NA Prior Therapy Facilty/Provider(s): NA Reason for Treatment: NA  Prior Outpatient Therapy Prior Outpatient Therapy: No Prior Therapy Dates: NA Prior Therapy Facilty/Provider(s): NA Reason for Treatment: NA Does patient have an ACCT team?: No Does patient have Intensive In-House Services?  : No Does patient have [facility] services? : No Does patient have P4CC services?: No  ADL Screening (condition at time of admission) Patient's cognitive ability adequate to safely complete daily activities?: No Is the patient deaf or have difficulty hearing?: No Does the patient have difficulty seeing, even when wearing glasses/contacts?: Yes Does the patient have  difficulty concentrating, remembering, or making decisions?: Yes Patient able to express need for assistance with ADLs?: Yes Does the patient have difficulty dressing or bathing?: Yes Independently performs ADLs?: No Communication: Independent Dressing (OT):  (UTA) Grooming: Needs assistance Is this a change from baseline?: Pre-admission baseline Feeding: Independent Bathing: Independent Toileting: Independent In/Out Bed: Independent Walks in Home: Independent Does the patient have difficulty walking or climbing stairs?: Yes Weakness of Legs:  (UTA) Weakness of Arms/Hands:  (UTA)  Home Assistive Devices/Equipment Home Assistive Devices/Equipment:  (UTA)    Abuse/Neglect Assessment (Assessment to be complete while patient is alone) Physical Abuse: Denies (UTA) Verbal Abuse: Denies (UTA) Sexual Abuse: Denies (UTA) Exploitation of patient/patient's resources: Denies Special educational needs teacher) Self-Neglect: Denies Special educational needs teacher)     Regulatory affairs officer (For Healthcare) Does Patient Have a Medical Advance Directive?: No Would patient like information on creating a medical advance directive?: No - Patient declined    Additional Information 1:1  In Past 12 Months?: No CIRT Risk: No Elopement Risk: No Does patient have medical clearance?: Yes     Disposition: Lindon Romp, NP recommends Gero-psychiatric treatment. Disposition discussed with Dr. Dina Rich and Margarita Grizzle, RN. TTS to seek placement.    Disposition Initial Assessment Completed for this Encounter: Yes Disposition of Patient: Inpatient treatment program Type of inpatient treatment program: Adult  This service was provided via telemedicine using a 2-way, interactive audio and video technology.  Names of all persons participating in this telemedicine service and their role in this encounter. Name: Lj Miyamoto Role: Son             Vertell Novak 02/15/2017 4:38 AM   Vertell Novak, MS, [facility], [facility] Triage Specialist [phone]

## 2017-02-15 NOTE — Progress Notes (Signed)
Patient meets criteria for inpatient treatment. CSW faxed referrals to the following inpatient facilities for review:  [facility]), [facility], [facility], [facility], [facility], [facility], [facility], [facility]   TTS will continue to seek bed placement.   Radonna Ricker MSW, [place] Disposition [phone]

## 2017-02-15 NOTE — BH Assessment (Signed)
  LPCA attempted to complete pt's BH Assessment via tele-assess but patient did not answer the assessment questions.  LPCA asked pt his name pt repetitively responded "Rogerio" and "I'm married" before turning his head in the opposite direction as if he was sleeping and not verbally responding to LPCA.  Pt's nurse was present during the interaction and tried to awake pt.    LPCA informed [facility] provider, Lindon Romp, NP who recommends pt assessed when he is more alert and able to participate in assessment.  LPCA informed pt's provider, Dr. Dina Rich, MD of the assessment attempt and that pt did not answer questions.  TTS will assess patient at a later time when pt is more alert and able to participate in assessment.  Francille Wittmann L. Osie Merkin, MS, LPCA, [facility] Therapeutic Triage Specialist  [phone]

## 2017-02-15 NOTE — ED Notes (Signed)
Pt returned from xray

## 2017-02-16 NOTE — ED Notes (Signed)
Per Romie Minus at [facility], patient must be IVCd in order to receive treatment, due to patient's competence and wife not being POA, will informed pt's wife when she returns to ED, EDP [place] aware.

## 2017-02-16 NOTE — ED Notes (Signed)
Patient's IVC paperwork was faxed to [facility] for admission to [facility].

## 2017-02-16 NOTE — Progress Notes (Signed)
Pt's most recent vitals faxed over to [facility].  Derek [name]. Judi Cong, MSW, [place] Disposition Clinical Social Work [phone] (cell) [phone] (office)

## 2017-02-16 NOTE — ED Notes (Signed)
Pt's grandson to see him, informed he could visit for ten minutes since it was past visiting hours, visitor wanded by security before entering ED.

## 2017-02-16 NOTE — ED Notes (Signed)
[facility] called and stated that [facility] had accepted the patient, but he had to be IVC'd in ordered to be accepted.  Wife cannot sign for patient because she is not HPOA.  Pts wife refused for him to be IVCd.  Pt has not been a threat to himself or others.

## 2017-02-16 NOTE — BH Assessment (Signed)
[facility] Assessment Progress Note  Pt reassessed this afternoon. His wife, [name], present for the assessment. Pt was oriented with pleasant affect. Pt reported being "sick as a dog". Wife explained this is b/c pt hasn't been eating well. Pt agreed. Pt then lapsed into saying "oh God" over and over again. When asked why he does this, pt says, "I guess I just got to say something". Pt is able to stop his chanting and answer questions appropriately. He denies SI, HI, AVH. Pt and wife are amenable to IP or OP treatment, whichever is decided.   Kenna Gilbert. Lovena Le, [place], [place], LPCA Counselor

## 2017-02-16 NOTE — Progress Notes (Signed)
Patient currently being reviewed at [facility] for inpatient geriatric- psychiatric treatment.   Disposition CSW will continue to follow for a decision.   Radonna Ricker MSW, [place] Disposition [phone]

## 2017-02-16 NOTE — Progress Notes (Addendum)
Patient accepted at [facility] for admission tomorrow, 02/17/17, contingent on patient being IVC'd (pt does not have a HCPOA). EDP has been asked to consider IVCing pt. Wife not happy about that stating, "My husband will be very upset about having to ride in a sheriff's deputy car.". [facility] willl confirm bed and accepting provider upon receipt of IVC.  Disposition CSWs will continue to follow.  Areatha Keas. Judi Cong, MSW, [place] Disposition Clinical Social Work [phone] (cell) [phone] (office)   CSW spoke with Dr. Dewayne Hatch at 19:20 to explain why there is a request to IVC patient.  Per review of pt's assessments, it appears that he does not have the capacity to sign himself in to treatment.  He also does not have a HCPOA.  Patient has been assessed and continues to meet criteria for in-patient treatment.  Dr. Dewayne Hatch has agreed to [facility] patient.  CSW will follow for placement.

## 2017-02-17 DIAGNOSIS — N400 Enlarged prostate without lower urinary tract symptoms: Secondary | ICD-10-CM | POA: Diagnosis not present

## 2017-02-17 DIAGNOSIS — R1010 Upper abdominal pain, unspecified: Secondary | ICD-10-CM | POA: Diagnosis not present

## 2017-02-17 DIAGNOSIS — D649 Anemia, unspecified: Secondary | ICD-10-CM | POA: Diagnosis not present

## 2017-02-17 DIAGNOSIS — J449 Chronic obstructive pulmonary disease, unspecified: Secondary | ICD-10-CM | POA: Diagnosis not present

## 2017-02-17 DIAGNOSIS — Z79899 Other long term (current) drug therapy: Secondary | ICD-10-CM | POA: Diagnosis not present

## 2017-02-17 DIAGNOSIS — E538 Deficiency of other specified B group vitamins: Secondary | ICD-10-CM | POA: Diagnosis not present

## 2017-02-17 DIAGNOSIS — E876 Hypokalemia: Secondary | ICD-10-CM | POA: Diagnosis not present

## 2017-02-17 DIAGNOSIS — Z885 Allergy status to narcotic agent status: Secondary | ICD-10-CM | POA: Diagnosis not present

## 2017-02-17 DIAGNOSIS — R1030 Lower abdominal pain, unspecified: Secondary | ICD-10-CM | POA: Diagnosis not present

## 2017-02-17 DIAGNOSIS — I959 Hypotension, unspecified: Secondary | ICD-10-CM | POA: Diagnosis not present

## 2017-02-17 DIAGNOSIS — F29 Unspecified psychosis not due to a substance or known physiological condition: Secondary | ICD-10-CM | POA: Diagnosis not present

## 2017-02-17 DIAGNOSIS — I10 Essential (primary) hypertension: Secondary | ICD-10-CM | POA: Diagnosis not present

## 2017-02-17 DIAGNOSIS — R102 Pelvic and perineal pain: Secondary | ICD-10-CM | POA: Diagnosis not present

## 2017-02-17 DIAGNOSIS — Z888 Allergy status to other drugs, medicaments and biological substances status: Secondary | ICD-10-CM | POA: Diagnosis not present

## 2017-02-17 DIAGNOSIS — F17200 Nicotine dependence, unspecified, uncomplicated: Secondary | ICD-10-CM | POA: Diagnosis not present

## 2017-02-17 DIAGNOSIS — M1990 Unspecified osteoarthritis, unspecified site: Secondary | ICD-10-CM | POA: Diagnosis not present

## 2017-02-17 DIAGNOSIS — R4689 Other symptoms and signs involving appearance and behavior: Secondary | ICD-10-CM | POA: Diagnosis not present

## 2017-02-17 DIAGNOSIS — I739 Peripheral vascular disease, unspecified: Secondary | ICD-10-CM | POA: Diagnosis not present

## 2017-02-17 DIAGNOSIS — K388 Other specified diseases of appendix: Secondary | ICD-10-CM | POA: Diagnosis not present

## 2017-02-17 DIAGNOSIS — F17210 Nicotine dependence, cigarettes, uncomplicated: Secondary | ICD-10-CM | POA: Diagnosis not present

## 2017-02-17 MED ORDER — ONDANSETRON 4 MG PO TBDP
4.0000 mg | ORAL_TABLET | Freq: Once | ORAL | Status: AC
  Administered 2017-02-17: 4 mg via ORAL

## 2017-02-17 MED ORDER — ONDANSETRON 4 MG PO TBDP
ORAL_TABLET | ORAL | Status: AC
  Filled 2017-02-17: qty 1

## 2017-02-17 NOTE — ED Notes (Signed)
Patient incontinent of urine, cleaned and changed patients scrubs, dry sheet applied to bed, patient resting comfortably.

## 2017-02-17 NOTE — ED Notes (Signed)
Communications called for sheriff to pick up patient to transport to [place].

## 2017-02-17 NOTE — ED Notes (Signed)
[facility] at bedside to transport patient.

## 2017-02-17 NOTE — ED Notes (Signed)
Pt continues to murmur, "Oh God" over and over. Pt is alert.

## 2017-02-17 NOTE — ED Notes (Signed)
Pt given belongings back

## 2017-02-17 NOTE — Progress Notes (Addendum)
Accepted to [facility] for inpatient gero-psych treatment.   Accepting and attending physician is Dr. Geanie Kenning.  Number for report is [phone] Patient can transport to facility now, the bed is ready.   Melissa from [facility] confirms that IVC paperwork has been received.   Jan Fireman, RN notified, agreed to inform the patient's assigned nurse.    Radonna Ricker MSW, [place] Disposition [phone]

## 2017-02-17 NOTE — ED Notes (Signed)
Patient IVCd paperwork faxed to [facility], awaiting a reply about admissions for am.

## 2017-03-01 DIAGNOSIS — F29 Unspecified psychosis not due to a substance or known physiological condition: Secondary | ICD-10-CM | POA: Diagnosis not present

## 2017-03-01 DIAGNOSIS — I10 Essential (primary) hypertension: Secondary | ICD-10-CM | POA: Diagnosis not present

## 2017-03-01 DIAGNOSIS — J449 Chronic obstructive pulmonary disease, unspecified: Secondary | ICD-10-CM | POA: Diagnosis not present

## 2017-03-01 DIAGNOSIS — N400 Enlarged prostate without lower urinary tract symptoms: Secondary | ICD-10-CM | POA: Diagnosis not present

## 2017-03-01 DIAGNOSIS — Z6827 Body mass index (BMI) 27.0-27.9, adult: Secondary | ICD-10-CM | POA: Diagnosis not present

## 2017-03-01 DIAGNOSIS — Z23 Encounter for immunization: Secondary | ICD-10-CM | POA: Diagnosis not present

## 2017-03-01 DIAGNOSIS — G894 Chronic pain syndrome: Secondary | ICD-10-CM | POA: Diagnosis not present

## 2017-03-31 DIAGNOSIS — Z6828 Body mass index (BMI) 28.0-28.9, adult: Secondary | ICD-10-CM | POA: Diagnosis not present

## 2017-03-31 DIAGNOSIS — E663 Overweight: Secondary | ICD-10-CM | POA: Diagnosis not present

## 2017-03-31 DIAGNOSIS — I10 Essential (primary) hypertension: Secondary | ICD-10-CM | POA: Diagnosis not present

## 2017-03-31 DIAGNOSIS — D649 Anemia, unspecified: Secondary | ICD-10-CM | POA: Diagnosis not present

## 2017-03-31 DIAGNOSIS — J449 Chronic obstructive pulmonary disease, unspecified: Secondary | ICD-10-CM | POA: Diagnosis not present

## 2017-03-31 DIAGNOSIS — R413 Other amnesia: Secondary | ICD-10-CM | POA: Diagnosis not present

## 2017-05-10 ENCOUNTER — Other Ambulatory Visit: Payer: Self-pay | Admitting: *Deleted

## 2017-05-10 NOTE — Patient Outreach (Signed)
Attempted HTA High Risk Screening outreach. I was only able to leave a message and requested a return call.  Eulah Pont. Myrtie Neither, MSN, [facility] Gerontological Nurse Practitioner [facility] Care Management [phone]

## 2017-05-18 ENCOUNTER — Other Ambulatory Visit: Payer: Self-pay | Admitting: *Deleted

## 2017-05-18 DIAGNOSIS — R4789 Other speech disturbances: Secondary | ICD-10-CM | POA: Diagnosis not present

## 2017-05-18 DIAGNOSIS — Z6828 Body mass index (BMI) 28.0-28.9, adult: Secondary | ICD-10-CM | POA: Diagnosis not present

## 2017-05-18 DIAGNOSIS — E663 Overweight: Secondary | ICD-10-CM | POA: Diagnosis not present

## 2017-05-18 DIAGNOSIS — F320 Major depressive disorder, single episode, mild: Secondary | ICD-10-CM | POA: Diagnosis not present

## 2017-05-18 DIAGNOSIS — J019 Acute sinusitis, unspecified: Secondary | ICD-10-CM | POA: Diagnosis not present

## 2017-05-18 NOTE — Patient Outreach (Signed)
HTA High Risk outreach #2. Unable to reach member but left a message requesting a return call.  Derek [name]. Myrtie Neither, MSN, [facility] Gerontological Nurse Practitioner [facility] Care Management [phone]

## 2017-05-20 ENCOUNTER — Other Ambulatory Visit: Payer: Self-pay | Admitting: *Deleted

## 2017-05-24 ENCOUNTER — Encounter: Payer: Self-pay | Admitting: *Deleted

## 2017-05-24 NOTE — Patient Outreach (Signed)
Derek [name] called me on Thursday morning 1/231/19, while I was in the car. We chatted about Derek [name] current medical needs. Mrs. Group said that he had never been diagnosed with a mental illness and said that the whole thing with sending him to [facility] for an involumartary commitment was very disturbing to both of them. Derek [name] returned home after a week at the facility. He is doing well according to his wife and they have no need for care management services. I will send them a letter and information about our services.  Derek [name]. Derek Neither, MSN, [facility] Gerontological Nurse Practitioner [facility] Care Management [phone]

## 2017-06-07 DIAGNOSIS — Z6825 Body mass index (BMI) 25.0-25.9, adult: Secondary | ICD-10-CM | POA: Diagnosis not present

## 2017-06-07 DIAGNOSIS — E663 Overweight: Secondary | ICD-10-CM | POA: Diagnosis not present

## 2017-06-07 DIAGNOSIS — R4681 Obsessive-compulsive behavior: Secondary | ICD-10-CM | POA: Diagnosis not present

## 2017-06-07 DIAGNOSIS — R4182 Altered mental status, unspecified: Secondary | ICD-10-CM | POA: Diagnosis not present

## 2017-08-03 ENCOUNTER — Encounter (HOSPITAL_COMMUNITY): Payer: PPO

## 2017-08-03 ENCOUNTER — Ambulatory Visit: Payer: PPO | Admitting: Family

## 2017-08-03 ENCOUNTER — Inpatient Hospital Stay (HOSPITAL_COMMUNITY): Admission: RE | Admit: 2017-08-03 | Payer: PPO | Source: Ambulatory Visit

## 2017-08-16 IMAGING — RF DG LUMBAR SPINE 2-3V
1 series · 3 of 3 positions shown · non-contrast
Comparison: Intraoperative localization image MRI lumbar spine
05/28/2016. Earlier today [DATE] p.m..

CLINICAL DATA: T10 through L2 posterior fusion.

EXAM:
Operative LUMBAR SPINE - 2-3 VIEW [DATE] p.m.:

[Series 1: run · 3 of 3 slices shown]
[im 1/3]
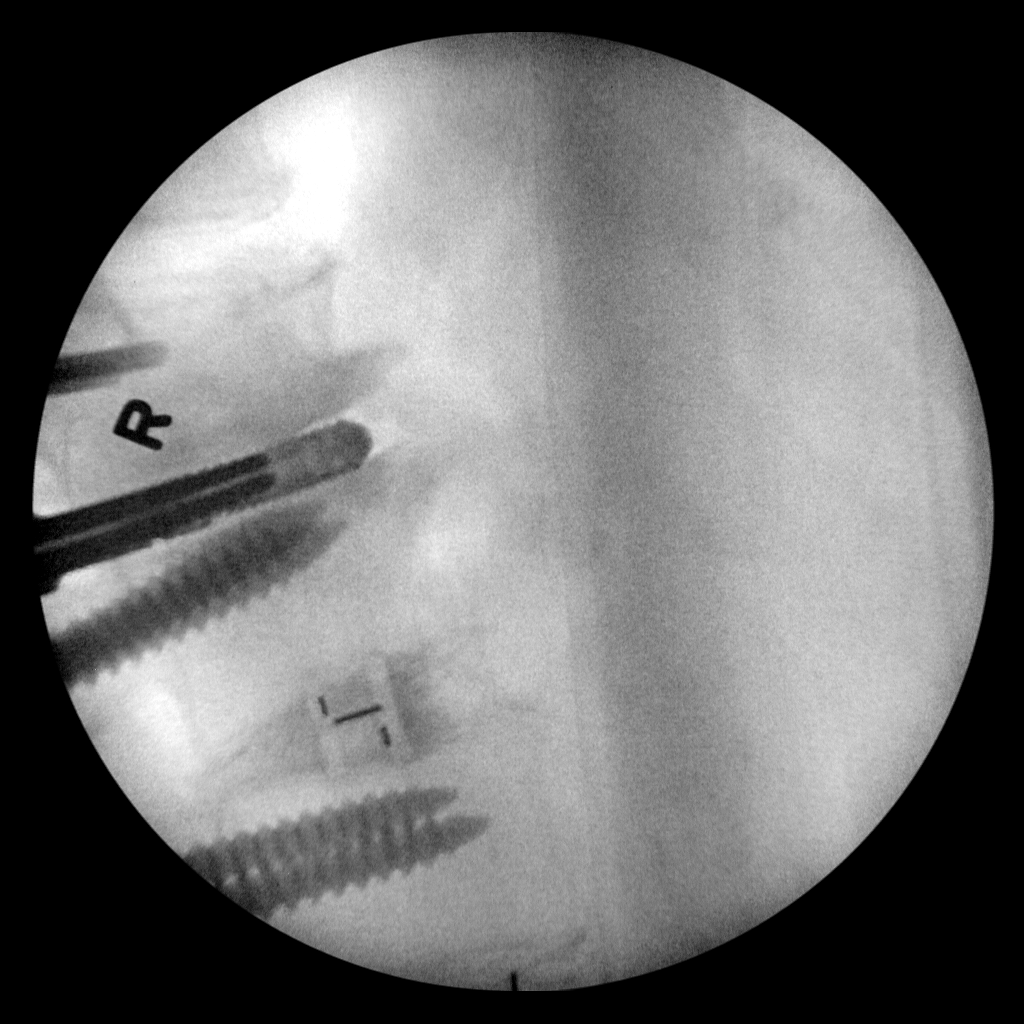
[im 2/3]
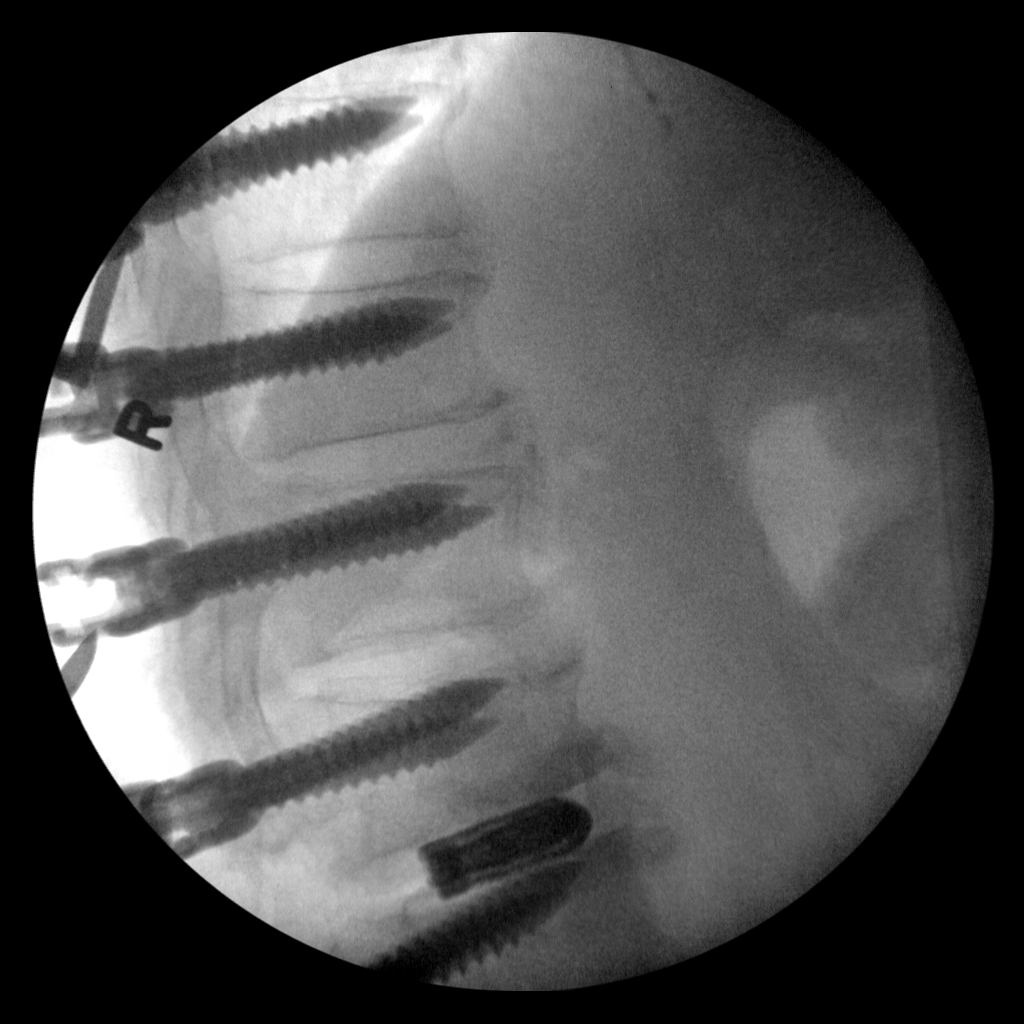
[im 3/3]
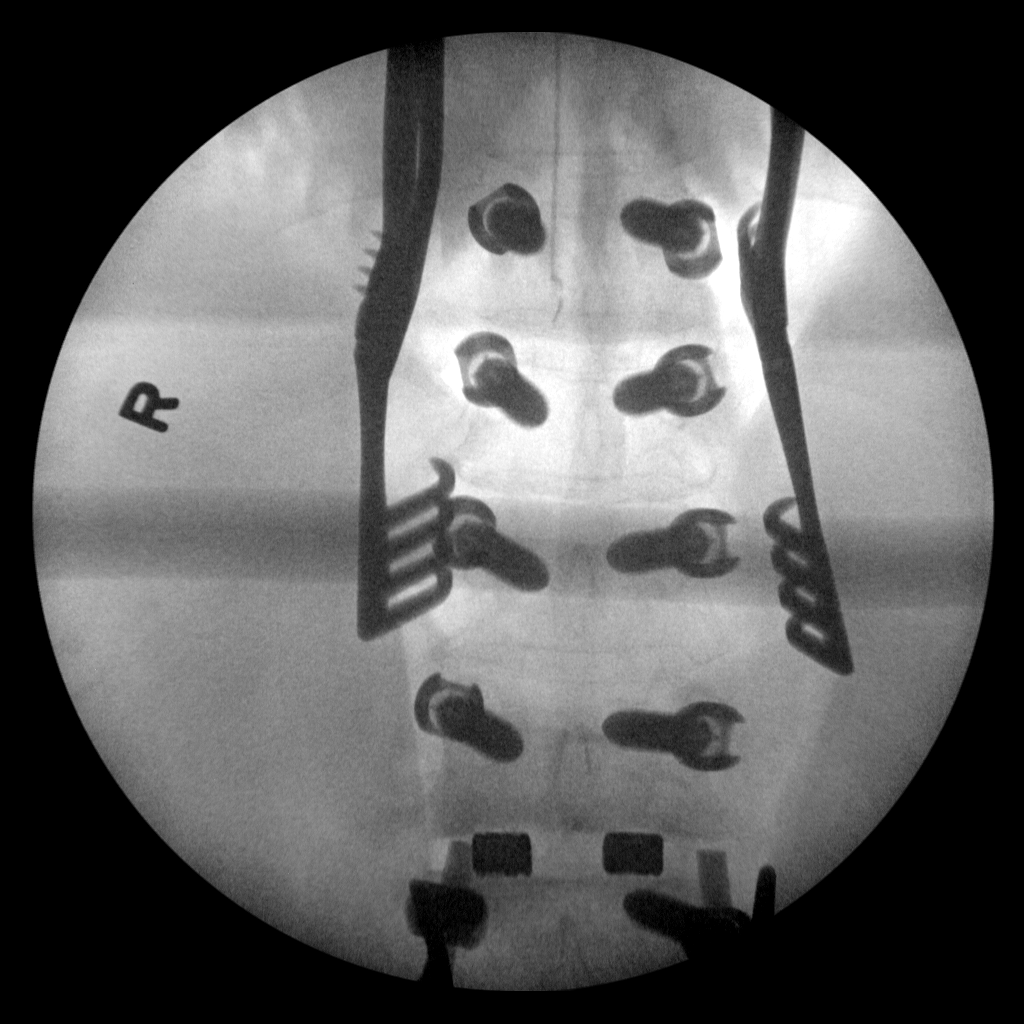

[3 of 3 positions shown; findings below may reference images not displayed]

FINDINGS: 3 spot images from the C-arm fluoroscopic device are submitted for
interpretation postoperatively. These are obtained at various points
during the T10 through L2 localization procedure. The final images
demonstrate bilateral pedicle screws at T10, T 11, T 12, L1 and L2.
Interbody fusion plugs are appropriately positioned in the disc
space at L1-2.
IMPRESSION: Images obtained during the T10 through L2 posterior fusion
demonstrating bilateral pedicle screws at these levels and
appropriately positioned interbody fusion plugs at L1-2.

## 2017-08-16 IMAGING — CR DG LUMBAR SPINE 1V
1 series · 1 of 1 positions shown · non-contrast
Comparison: MRI May 28, 2016.

CLINICAL DATA: Posterior lumbar interbody fusion at L1-2.

EXAM:
LUMBAR SPINE - 1 VIEW

[lateral]
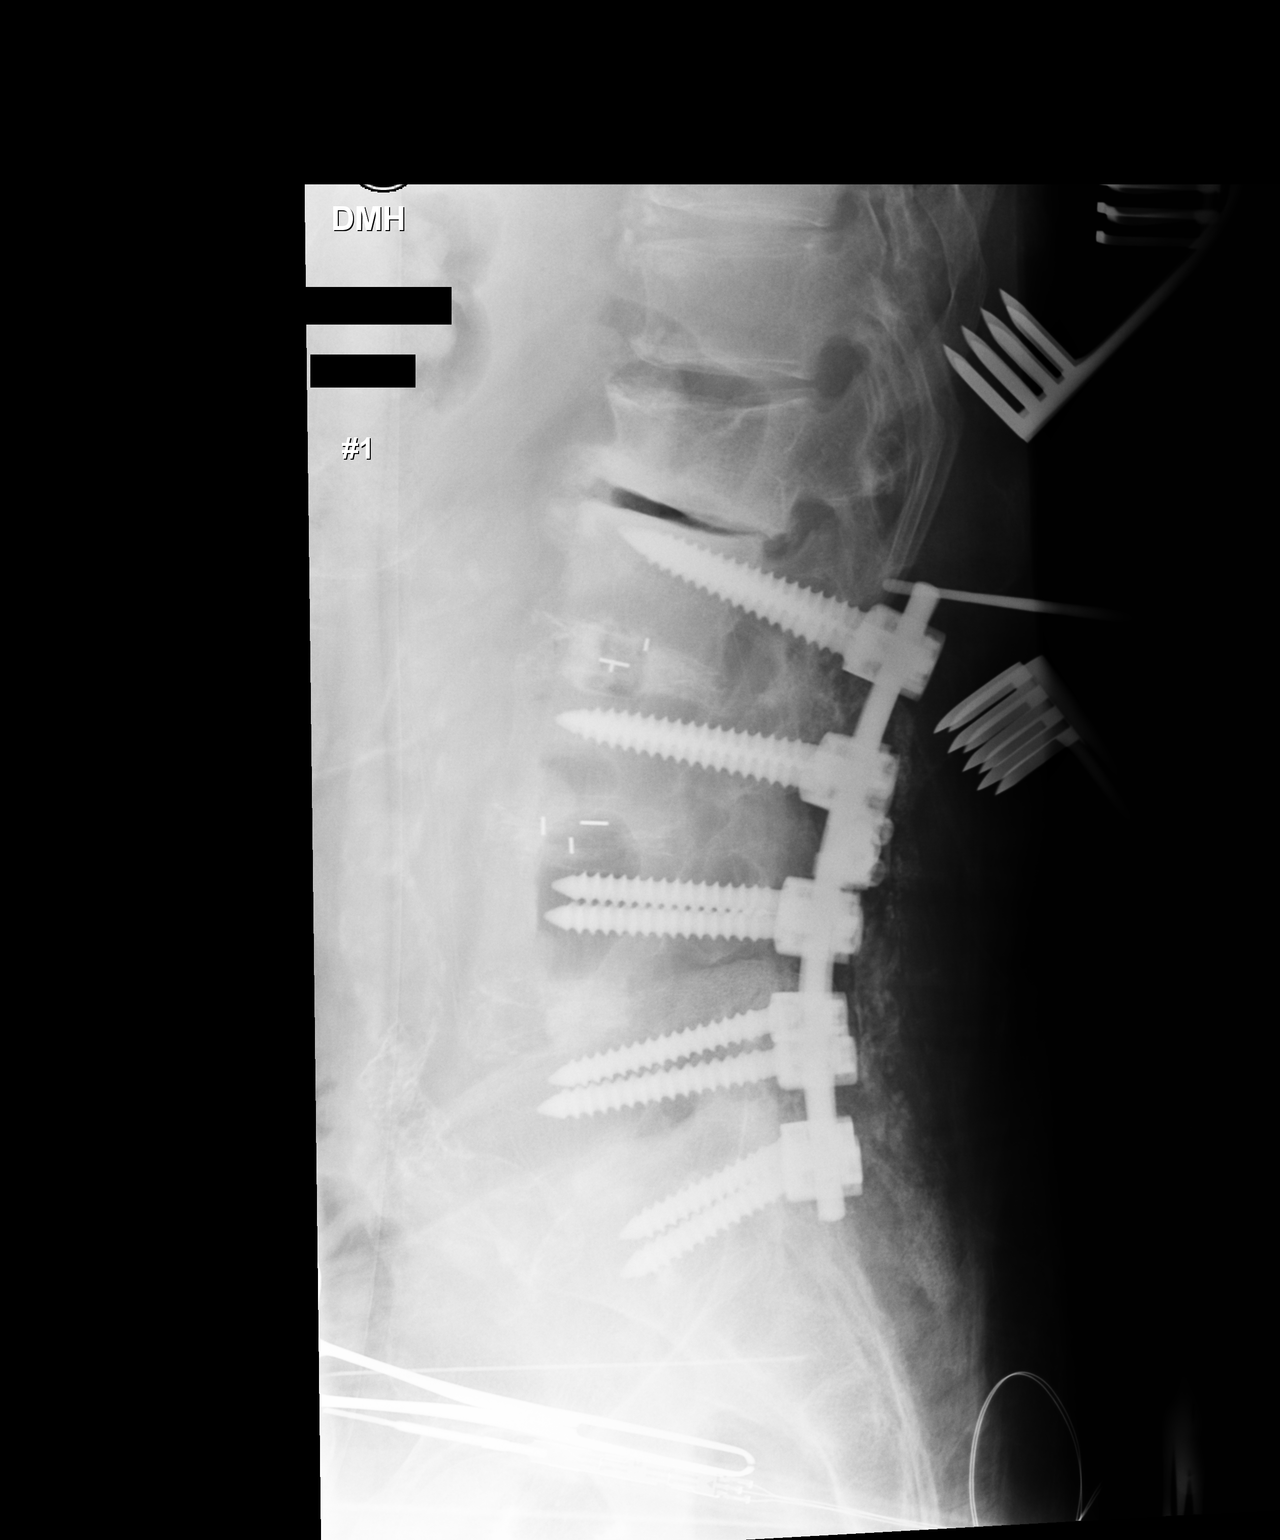

[1 of 1 positions shown; findings below may reference images not displayed]

FINDINGS: Single intraoperative cross-table lateral projection of the lumbar
spine was obtained. Surgical probe is seen at the level of L1-2.
IMPRESSION: Surgical localization as described above.

## 2017-10-04 DIAGNOSIS — E663 Overweight: Secondary | ICD-10-CM | POA: Diagnosis not present

## 2017-10-04 DIAGNOSIS — R4681 Obsessive-compulsive behavior: Secondary | ICD-10-CM | POA: Diagnosis not present

## 2017-10-04 DIAGNOSIS — Z6825 Body mass index (BMI) 25.0-25.9, adult: Secondary | ICD-10-CM | POA: Diagnosis not present

## 2017-10-04 DIAGNOSIS — R4182 Altered mental status, unspecified: Secondary | ICD-10-CM | POA: Diagnosis not present

## 2017-10-28 DIAGNOSIS — E876 Hypokalemia: Secondary | ICD-10-CM | POA: Diagnosis not present

## 2017-10-28 DIAGNOSIS — F320 Major depressive disorder, single episode, mild: Secondary | ICD-10-CM | POA: Diagnosis not present

## 2017-10-28 DIAGNOSIS — Z0000 Encounter for general adult medical examination without abnormal findings: Secondary | ICD-10-CM | POA: Diagnosis not present

## 2017-10-28 DIAGNOSIS — Z0001 Encounter for general adult medical examination with abnormal findings: Secondary | ICD-10-CM | POA: Diagnosis not present

## 2017-10-28 DIAGNOSIS — R479 Unspecified speech disturbances: Secondary | ICD-10-CM | POA: Diagnosis not present

## 2017-10-28 DIAGNOSIS — Z1389 Encounter for screening for other disorder: Secondary | ICD-10-CM | POA: Diagnosis not present

## 2017-10-28 DIAGNOSIS — Z6828 Body mass index (BMI) 28.0-28.9, adult: Secondary | ICD-10-CM | POA: Diagnosis not present

## 2017-10-28 DIAGNOSIS — I10 Essential (primary) hypertension: Secondary | ICD-10-CM | POA: Diagnosis not present

## 2017-12-09 DIAGNOSIS — R4789 Other speech disturbances: Secondary | ICD-10-CM | POA: Diagnosis not present

## 2017-12-09 DIAGNOSIS — Z6828 Body mass index (BMI) 28.0-28.9, adult: Secondary | ICD-10-CM | POA: Diagnosis not present

## 2017-12-09 DIAGNOSIS — I10 Essential (primary) hypertension: Secondary | ICD-10-CM | POA: Diagnosis not present

## 2017-12-09 DIAGNOSIS — E663 Overweight: Secondary | ICD-10-CM | POA: Diagnosis not present

## 2017-12-09 DIAGNOSIS — F419 Anxiety disorder, unspecified: Secondary | ICD-10-CM | POA: Diagnosis not present

## 2018-03-09 DIAGNOSIS — Z6828 Body mass index (BMI) 28.0-28.9, adult: Secondary | ICD-10-CM | POA: Diagnosis not present

## 2018-03-09 DIAGNOSIS — Z1389 Encounter for screening for other disorder: Secondary | ICD-10-CM | POA: Diagnosis not present

## 2018-03-09 DIAGNOSIS — I10 Essential (primary) hypertension: Secondary | ICD-10-CM | POA: Diagnosis not present

## 2018-03-09 DIAGNOSIS — F419 Anxiety disorder, unspecified: Secondary | ICD-10-CM | POA: Diagnosis not present

## 2018-03-09 DIAGNOSIS — E663 Overweight: Secondary | ICD-10-CM | POA: Diagnosis not present

## 2018-06-13 DIAGNOSIS — F419 Anxiety disorder, unspecified: Secondary | ICD-10-CM | POA: Diagnosis not present

## 2018-06-13 DIAGNOSIS — N400 Enlarged prostate without lower urinary tract symptoms: Secondary | ICD-10-CM | POA: Diagnosis not present

## 2018-06-13 DIAGNOSIS — Z681 Body mass index (BMI) 19 or less, adult: Secondary | ICD-10-CM | POA: Diagnosis not present

## 2018-06-13 DIAGNOSIS — J449 Chronic obstructive pulmonary disease, unspecified: Secondary | ICD-10-CM | POA: Diagnosis not present

## 2018-06-13 DIAGNOSIS — Z0001 Encounter for general adult medical examination with abnormal findings: Secondary | ICD-10-CM | POA: Diagnosis not present

## 2018-06-13 DIAGNOSIS — M5137 Other intervertebral disc degeneration, lumbosacral region: Secondary | ICD-10-CM | POA: Diagnosis not present

## 2018-06-13 DIAGNOSIS — Z1389 Encounter for screening for other disorder: Secondary | ICD-10-CM | POA: Diagnosis not present

## 2018-06-13 DIAGNOSIS — Z0000 Encounter for general adult medical examination without abnormal findings: Secondary | ICD-10-CM | POA: Diagnosis not present

## 2018-06-13 DIAGNOSIS — I10 Essential (primary) hypertension: Secondary | ICD-10-CM | POA: Diagnosis not present

## 2018-09-06 DIAGNOSIS — M47817 Spondylosis without myelopathy or radiculopathy, lumbosacral region: Secondary | ICD-10-CM | POA: Diagnosis not present

## 2018-09-06 DIAGNOSIS — Z6829 Body mass index (BMI) 29.0-29.9, adult: Secondary | ICD-10-CM | POA: Diagnosis not present

## 2018-09-06 DIAGNOSIS — G894 Chronic pain syndrome: Secondary | ICD-10-CM | POA: Diagnosis not present

## 2018-09-06 DIAGNOSIS — M1991 Primary osteoarthritis, unspecified site: Secondary | ICD-10-CM | POA: Diagnosis not present

## 2018-12-06 DIAGNOSIS — F419 Anxiety disorder, unspecified: Secondary | ICD-10-CM | POA: Diagnosis not present

## 2018-12-06 DIAGNOSIS — Z6830 Body mass index (BMI) 30.0-30.9, adult: Secondary | ICD-10-CM | POA: Diagnosis not present

## 2018-12-06 DIAGNOSIS — E6609 Other obesity due to excess calories: Secondary | ICD-10-CM | POA: Diagnosis not present

## 2019-03-13 DIAGNOSIS — F419 Anxiety disorder, unspecified: Secondary | ICD-10-CM | POA: Diagnosis not present

## 2019-03-13 DIAGNOSIS — E6609 Other obesity due to excess calories: Secondary | ICD-10-CM | POA: Diagnosis not present

## 2019-03-13 DIAGNOSIS — Z6830 Body mass index (BMI) 30.0-30.9, adult: Secondary | ICD-10-CM | POA: Diagnosis not present

## 2019-06-07 DIAGNOSIS — Z1389 Encounter for screening for other disorder: Secondary | ICD-10-CM | POA: Diagnosis not present

## 2019-06-07 DIAGNOSIS — Z0000 Encounter for general adult medical examination without abnormal findings: Secondary | ICD-10-CM | POA: Diagnosis not present

## 2019-06-07 DIAGNOSIS — M47817 Spondylosis without myelopathy or radiculopathy, lumbosacral region: Secondary | ICD-10-CM | POA: Diagnosis not present

## 2019-06-07 DIAGNOSIS — Z6831 Body mass index (BMI) 31.0-31.9, adult: Secondary | ICD-10-CM | POA: Diagnosis not present

## 2019-06-07 DIAGNOSIS — F419 Anxiety disorder, unspecified: Secondary | ICD-10-CM | POA: Diagnosis not present

## 2019-06-07 DIAGNOSIS — M879 Osteonecrosis, unspecified: Secondary | ICD-10-CM | POA: Diagnosis not present

## 2019-06-07 DIAGNOSIS — Z0001 Encounter for general adult medical examination with abnormal findings: Secondary | ICD-10-CM | POA: Diagnosis not present

## 2019-07-19 DIAGNOSIS — F29 Unspecified psychosis not due to a substance or known physiological condition: Secondary | ICD-10-CM | POA: Diagnosis not present

## 2019-07-19 DIAGNOSIS — M1991 Primary osteoarthritis, unspecified site: Secondary | ICD-10-CM | POA: Diagnosis not present

## 2019-07-19 DIAGNOSIS — I10 Essential (primary) hypertension: Secondary | ICD-10-CM | POA: Diagnosis not present

## 2019-07-19 DIAGNOSIS — M5136 Other intervertebral disc degeneration, lumbar region: Secondary | ICD-10-CM | POA: Diagnosis not present

## 2019-08-18 DIAGNOSIS — I110 Hypertensive heart disease with heart failure: Secondary | ICD-10-CM | POA: Diagnosis not present

## 2019-08-18 DIAGNOSIS — I5022 Chronic systolic (congestive) heart failure: Secondary | ICD-10-CM | POA: Diagnosis not present

## 2019-08-18 DIAGNOSIS — I739 Peripheral vascular disease, unspecified: Secondary | ICD-10-CM | POA: Diagnosis not present

## 2019-08-18 DIAGNOSIS — M1990 Unspecified osteoarthritis, unspecified site: Secondary | ICD-10-CM | POA: Diagnosis not present

## 2019-09-04 DIAGNOSIS — E663 Overweight: Secondary | ICD-10-CM | POA: Diagnosis not present

## 2019-09-04 DIAGNOSIS — F419 Anxiety disorder, unspecified: Secondary | ICD-10-CM | POA: Diagnosis not present

## 2019-09-04 DIAGNOSIS — Z6829 Body mass index (BMI) 29.0-29.9, adult: Secondary | ICD-10-CM | POA: Diagnosis not present

## 2019-09-04 DIAGNOSIS — M47817 Spondylosis without myelopathy or radiculopathy, lumbosacral region: Secondary | ICD-10-CM | POA: Diagnosis not present

## 2019-09-18 DIAGNOSIS — I5022 Chronic systolic (congestive) heart failure: Secondary | ICD-10-CM | POA: Diagnosis not present

## 2019-09-18 DIAGNOSIS — M1990 Unspecified osteoarthritis, unspecified site: Secondary | ICD-10-CM | POA: Diagnosis not present

## 2019-09-18 DIAGNOSIS — I739 Peripheral vascular disease, unspecified: Secondary | ICD-10-CM | POA: Diagnosis not present

## 2019-09-18 DIAGNOSIS — I110 Hypertensive heart disease with heart failure: Secondary | ICD-10-CM | POA: Diagnosis not present

## 2019-11-17 DIAGNOSIS — I110 Hypertensive heart disease with heart failure: Secondary | ICD-10-CM | POA: Diagnosis not present

## 2019-11-17 DIAGNOSIS — I5022 Chronic systolic (congestive) heart failure: Secondary | ICD-10-CM | POA: Diagnosis not present

## 2019-11-17 DIAGNOSIS — I739 Peripheral vascular disease, unspecified: Secondary | ICD-10-CM | POA: Diagnosis not present

## 2019-11-17 DIAGNOSIS — M1990 Unspecified osteoarthritis, unspecified site: Secondary | ICD-10-CM | POA: Diagnosis not present

## 2019-12-06 DIAGNOSIS — F419 Anxiety disorder, unspecified: Secondary | ICD-10-CM | POA: Diagnosis not present

## 2019-12-06 DIAGNOSIS — E663 Overweight: Secondary | ICD-10-CM | POA: Diagnosis not present

## 2019-12-06 DIAGNOSIS — R4789 Other speech disturbances: Secondary | ICD-10-CM | POA: Diagnosis not present

## 2019-12-06 DIAGNOSIS — Z6828 Body mass index (BMI) 28.0-28.9, adult: Secondary | ICD-10-CM | POA: Diagnosis not present

## 2019-12-06 DIAGNOSIS — N400 Enlarged prostate without lower urinary tract symptoms: Secondary | ICD-10-CM | POA: Diagnosis not present

## 2019-12-06 DIAGNOSIS — G608 Other hereditary and idiopathic neuropathies: Secondary | ICD-10-CM | POA: Diagnosis not present

## 2020-01-18 DIAGNOSIS — G894 Chronic pain syndrome: Secondary | ICD-10-CM | POA: Diagnosis not present

## 2020-01-18 DIAGNOSIS — M5136 Other intervertebral disc degeneration, lumbar region: Secondary | ICD-10-CM | POA: Diagnosis not present

## 2020-01-18 DIAGNOSIS — F29 Unspecified psychosis not due to a substance or known physiological condition: Secondary | ICD-10-CM | POA: Diagnosis not present

## 2020-01-18 DIAGNOSIS — F4542 Pain disorder with related psychological factors: Secondary | ICD-10-CM | POA: Diagnosis not present

## 2020-03-05 DIAGNOSIS — Z6828 Body mass index (BMI) 28.0-28.9, adult: Secondary | ICD-10-CM | POA: Diagnosis not present

## 2020-03-05 DIAGNOSIS — M5137 Other intervertebral disc degeneration, lumbosacral region: Secondary | ICD-10-CM | POA: Diagnosis not present

## 2020-03-05 DIAGNOSIS — M1991 Primary osteoarthritis, unspecified site: Secondary | ICD-10-CM | POA: Diagnosis not present

## 2020-03-19 DIAGNOSIS — I10 Essential (primary) hypertension: Secondary | ICD-10-CM | POA: Diagnosis not present

## 2020-03-19 DIAGNOSIS — M1991 Primary osteoarthritis, unspecified site: Secondary | ICD-10-CM | POA: Diagnosis not present

## 2020-03-19 DIAGNOSIS — G894 Chronic pain syndrome: Secondary | ICD-10-CM | POA: Diagnosis not present

## 2020-03-19 DIAGNOSIS — M5136 Other intervertebral disc degeneration, lumbar region: Secondary | ICD-10-CM | POA: Diagnosis not present

## 2020-05-18 DIAGNOSIS — M5136 Other intervertebral disc degeneration, lumbar region: Secondary | ICD-10-CM | POA: Diagnosis not present

## 2020-05-18 DIAGNOSIS — I10 Essential (primary) hypertension: Secondary | ICD-10-CM | POA: Diagnosis not present

## 2020-05-18 DIAGNOSIS — M1991 Primary osteoarthritis, unspecified site: Secondary | ICD-10-CM | POA: Diagnosis not present

## 2020-05-18 DIAGNOSIS — G894 Chronic pain syndrome: Secondary | ICD-10-CM | POA: Diagnosis not present

## 2020-05-18 DIAGNOSIS — F29 Unspecified psychosis not due to a substance or known physiological condition: Secondary | ICD-10-CM | POA: Diagnosis not present

## 2020-06-03 DIAGNOSIS — F419 Anxiety disorder, unspecified: Secondary | ICD-10-CM | POA: Diagnosis not present

## 2020-06-03 DIAGNOSIS — Z1331 Encounter for screening for depression: Secondary | ICD-10-CM | POA: Diagnosis not present

## 2020-06-03 DIAGNOSIS — Z6828 Body mass index (BMI) 28.0-28.9, adult: Secondary | ICD-10-CM | POA: Diagnosis not present

## 2020-06-03 DIAGNOSIS — E663 Overweight: Secondary | ICD-10-CM | POA: Diagnosis not present

## 2020-09-10 DIAGNOSIS — Z1331 Encounter for screening for depression: Secondary | ICD-10-CM | POA: Diagnosis not present

## 2020-09-10 DIAGNOSIS — Z6829 Body mass index (BMI) 29.0-29.9, adult: Secondary | ICD-10-CM | POA: Diagnosis not present

## 2020-09-10 DIAGNOSIS — Z1389 Encounter for screening for other disorder: Secondary | ICD-10-CM | POA: Diagnosis not present

## 2020-09-10 DIAGNOSIS — F419 Anxiety disorder, unspecified: Secondary | ICD-10-CM | POA: Diagnosis not present

## 2020-09-10 DIAGNOSIS — E663 Overweight: Secondary | ICD-10-CM | POA: Diagnosis not present

## 2020-09-10 DIAGNOSIS — Z0000 Encounter for general adult medical examination without abnormal findings: Secondary | ICD-10-CM | POA: Diagnosis not present

## 2020-12-04 DIAGNOSIS — F419 Anxiety disorder, unspecified: Secondary | ICD-10-CM | POA: Diagnosis not present

## 2020-12-04 DIAGNOSIS — E663 Overweight: Secondary | ICD-10-CM | POA: Diagnosis not present

## 2020-12-04 DIAGNOSIS — Z6829 Body mass index (BMI) 29.0-29.9, adult: Secondary | ICD-10-CM | POA: Diagnosis not present

## 2021-03-03 DIAGNOSIS — F419 Anxiety disorder, unspecified: Secondary | ICD-10-CM | POA: Diagnosis not present

## 2021-03-03 DIAGNOSIS — Z6830 Body mass index (BMI) 30.0-30.9, adult: Secondary | ICD-10-CM | POA: Diagnosis not present

## 2021-03-03 DIAGNOSIS — E663 Overweight: Secondary | ICD-10-CM | POA: Diagnosis not present

## 2021-06-10 DIAGNOSIS — I10 Essential (primary) hypertension: Secondary | ICD-10-CM | POA: Diagnosis not present

## 2021-06-10 DIAGNOSIS — M1991 Primary osteoarthritis, unspecified site: Secondary | ICD-10-CM | POA: Diagnosis not present

## 2021-06-10 DIAGNOSIS — Z6831 Body mass index (BMI) 31.0-31.9, adult: Secondary | ICD-10-CM | POA: Diagnosis not present

## 2021-06-10 DIAGNOSIS — E6609 Other obesity due to excess calories: Secondary | ICD-10-CM | POA: Diagnosis not present

## 2021-06-10 DIAGNOSIS — Z1331 Encounter for screening for depression: Secondary | ICD-10-CM | POA: Diagnosis not present

## 2021-06-10 DIAGNOSIS — G894 Chronic pain syndrome: Secondary | ICD-10-CM | POA: Diagnosis not present

## 2021-06-10 DIAGNOSIS — F320 Major depressive disorder, single episode, mild: Secondary | ICD-10-CM | POA: Diagnosis not present

## 2021-06-10 DIAGNOSIS — F419 Anxiety disorder, unspecified: Secondary | ICD-10-CM | POA: Diagnosis not present

## 2021-09-04 DIAGNOSIS — I5022 Chronic systolic (congestive) heart failure: Secondary | ICD-10-CM | POA: Diagnosis not present

## 2021-09-04 DIAGNOSIS — F419 Anxiety disorder, unspecified: Secondary | ICD-10-CM | POA: Diagnosis not present

## 2021-09-04 DIAGNOSIS — E6609 Other obesity due to excess calories: Secondary | ICD-10-CM | POA: Diagnosis not present

## 2021-09-04 DIAGNOSIS — Z6830 Body mass index (BMI) 30.0-30.9, adult: Secondary | ICD-10-CM | POA: Diagnosis not present

## 2021-12-12 DIAGNOSIS — G894 Chronic pain syndrome: Secondary | ICD-10-CM | POA: Diagnosis not present

## 2021-12-12 DIAGNOSIS — M1991 Primary osteoarthritis, unspecified site: Secondary | ICD-10-CM | POA: Diagnosis not present

## 2021-12-12 DIAGNOSIS — K219 Gastro-esophageal reflux disease without esophagitis: Secondary | ICD-10-CM | POA: Diagnosis not present

## 2021-12-12 DIAGNOSIS — Z0001 Encounter for general adult medical examination with abnormal findings: Secondary | ICD-10-CM | POA: Diagnosis not present

## 2021-12-12 DIAGNOSIS — F320 Major depressive disorder, single episode, mild: Secondary | ICD-10-CM | POA: Diagnosis not present

## 2021-12-12 DIAGNOSIS — J302 Other seasonal allergic rhinitis: Secondary | ICD-10-CM | POA: Diagnosis not present

## 2021-12-12 DIAGNOSIS — Z1331 Encounter for screening for depression: Secondary | ICD-10-CM | POA: Diagnosis not present

## 2021-12-12 DIAGNOSIS — J449 Chronic obstructive pulmonary disease, unspecified: Secondary | ICD-10-CM | POA: Diagnosis not present

## 2021-12-12 DIAGNOSIS — I5022 Chronic systolic (congestive) heart failure: Secondary | ICD-10-CM | POA: Diagnosis not present

## 2021-12-12 DIAGNOSIS — Z6830 Body mass index (BMI) 30.0-30.9, adult: Secondary | ICD-10-CM | POA: Diagnosis not present

## 2021-12-12 DIAGNOSIS — I10 Essential (primary) hypertension: Secondary | ICD-10-CM | POA: Diagnosis not present

## 2021-12-12 DIAGNOSIS — E6609 Other obesity due to excess calories: Secondary | ICD-10-CM | POA: Diagnosis not present

## 2021-12-16 DIAGNOSIS — I10 Essential (primary) hypertension: Secondary | ICD-10-CM | POA: Diagnosis not present

## 2021-12-16 DIAGNOSIS — Z125 Encounter for screening for malignant neoplasm of prostate: Secondary | ICD-10-CM | POA: Diagnosis not present

## 2021-12-16 DIAGNOSIS — Z0001 Encounter for general adult medical examination with abnormal findings: Secondary | ICD-10-CM | POA: Diagnosis not present

## 2022-03-06 DIAGNOSIS — F320 Major depressive disorder, single episode, mild: Secondary | ICD-10-CM | POA: Diagnosis not present

## 2022-03-06 DIAGNOSIS — F419 Anxiety disorder, unspecified: Secondary | ICD-10-CM | POA: Diagnosis not present

## 2022-03-06 DIAGNOSIS — Z6831 Body mass index (BMI) 31.0-31.9, adult: Secondary | ICD-10-CM | POA: Diagnosis not present

## 2022-03-06 DIAGNOSIS — E6609 Other obesity due to excess calories: Secondary | ICD-10-CM | POA: Diagnosis not present

## 2022-03-06 DIAGNOSIS — J302 Other seasonal allergic rhinitis: Secondary | ICD-10-CM | POA: Diagnosis not present

## 2022-06-05 DIAGNOSIS — E6609 Other obesity due to excess calories: Secondary | ICD-10-CM | POA: Diagnosis not present

## 2022-06-05 DIAGNOSIS — G2401 Drug induced subacute dyskinesia: Secondary | ICD-10-CM | POA: Diagnosis not present

## 2022-06-05 DIAGNOSIS — F320 Major depressive disorder, single episode, mild: Secondary | ICD-10-CM | POA: Diagnosis not present

## 2022-06-05 DIAGNOSIS — D485 Neoplasm of uncertain behavior of skin: Secondary | ICD-10-CM | POA: Diagnosis not present

## 2022-06-05 DIAGNOSIS — Z6831 Body mass index (BMI) 31.0-31.9, adult: Secondary | ICD-10-CM | POA: Diagnosis not present

## 2022-06-05 DIAGNOSIS — F419 Anxiety disorder, unspecified: Secondary | ICD-10-CM | POA: Diagnosis not present

## 2022-09-04 DIAGNOSIS — F320 Major depressive disorder, single episode, mild: Secondary | ICD-10-CM | POA: Diagnosis not present

## 2022-09-04 DIAGNOSIS — F419 Anxiety disorder, unspecified: Secondary | ICD-10-CM | POA: Diagnosis not present

## 2022-09-04 DIAGNOSIS — G2401 Drug induced subacute dyskinesia: Secondary | ICD-10-CM | POA: Diagnosis not present

## 2022-09-04 DIAGNOSIS — E6609 Other obesity due to excess calories: Secondary | ICD-10-CM | POA: Diagnosis not present

## 2022-09-04 DIAGNOSIS — Z6831 Body mass index (BMI) 31.0-31.9, adult: Secondary | ICD-10-CM | POA: Diagnosis not present

## 2022-12-17 DIAGNOSIS — F419 Anxiety disorder, unspecified: Secondary | ICD-10-CM | POA: Diagnosis not present

## 2022-12-17 DIAGNOSIS — G2401 Drug induced subacute dyskinesia: Secondary | ICD-10-CM | POA: Diagnosis not present

## 2022-12-17 DIAGNOSIS — M5137 Other intervertebral disc degeneration, lumbosacral region: Secondary | ICD-10-CM | POA: Diagnosis not present

## 2022-12-17 DIAGNOSIS — Z0001 Encounter for general adult medical examination with abnormal findings: Secondary | ICD-10-CM | POA: Diagnosis not present

## 2022-12-17 DIAGNOSIS — J449 Chronic obstructive pulmonary disease, unspecified: Secondary | ICD-10-CM | POA: Diagnosis not present

## 2022-12-17 DIAGNOSIS — I739 Peripheral vascular disease, unspecified: Secondary | ICD-10-CM | POA: Diagnosis not present

## 2022-12-17 DIAGNOSIS — I5022 Chronic systolic (congestive) heart failure: Secondary | ICD-10-CM | POA: Diagnosis not present

## 2022-12-17 DIAGNOSIS — E6609 Other obesity due to excess calories: Secondary | ICD-10-CM | POA: Diagnosis not present

## 2022-12-17 DIAGNOSIS — M47817 Spondylosis without myelopathy or radiculopathy, lumbosacral region: Secondary | ICD-10-CM | POA: Diagnosis not present

## 2022-12-17 DIAGNOSIS — Z6831 Body mass index (BMI) 31.0-31.9, adult: Secondary | ICD-10-CM | POA: Diagnosis not present

## 2022-12-17 DIAGNOSIS — Z1331 Encounter for screening for depression: Secondary | ICD-10-CM | POA: Diagnosis not present

## 2022-12-17 DIAGNOSIS — I10 Essential (primary) hypertension: Secondary | ICD-10-CM | POA: Diagnosis not present

## 2023-03-11 DIAGNOSIS — Z6831 Body mass index (BMI) 31.0-31.9, adult: Secondary | ICD-10-CM | POA: Diagnosis not present

## 2023-03-11 DIAGNOSIS — F320 Major depressive disorder, single episode, mild: Secondary | ICD-10-CM | POA: Diagnosis not present

## 2023-03-11 DIAGNOSIS — G2401 Drug induced subacute dyskinesia: Secondary | ICD-10-CM | POA: Diagnosis not present

## 2023-03-11 DIAGNOSIS — E6609 Other obesity due to excess calories: Secondary | ICD-10-CM | POA: Diagnosis not present

## 2023-06-04 DIAGNOSIS — I5022 Chronic systolic (congestive) heart failure: Secondary | ICD-10-CM | POA: Diagnosis not present

## 2023-06-04 DIAGNOSIS — I10 Essential (primary) hypertension: Secondary | ICD-10-CM | POA: Diagnosis not present

## 2023-06-04 DIAGNOSIS — Z6830 Body mass index (BMI) 30.0-30.9, adult: Secondary | ICD-10-CM | POA: Diagnosis not present

## 2023-06-04 DIAGNOSIS — E6609 Other obesity due to excess calories: Secondary | ICD-10-CM | POA: Diagnosis not present

## 2023-06-04 DIAGNOSIS — G2401 Drug induced subacute dyskinesia: Secondary | ICD-10-CM | POA: Diagnosis not present

## 2023-12-02 DIAGNOSIS — F419 Anxiety disorder, unspecified: Secondary | ICD-10-CM | POA: Diagnosis not present

## 2023-12-02 DIAGNOSIS — G2401 Drug induced subacute dyskinesia: Secondary | ICD-10-CM | POA: Diagnosis not present

## 2023-12-02 DIAGNOSIS — E6609 Other obesity due to excess calories: Secondary | ICD-10-CM | POA: Diagnosis not present

## 2023-12-02 DIAGNOSIS — Z6830 Body mass index (BMI) 30.0-30.9, adult: Secondary | ICD-10-CM | POA: Diagnosis not present

## 2023-12-23 DIAGNOSIS — F320 Major depressive disorder, single episode, mild: Secondary | ICD-10-CM | POA: Diagnosis not present

## 2023-12-23 DIAGNOSIS — J449 Chronic obstructive pulmonary disease, unspecified: Secondary | ICD-10-CM | POA: Diagnosis not present

## 2023-12-23 DIAGNOSIS — E6609 Other obesity due to excess calories: Secondary | ICD-10-CM | POA: Diagnosis not present

## 2023-12-23 DIAGNOSIS — I5022 Chronic systolic (congestive) heart failure: Secondary | ICD-10-CM | POA: Diagnosis not present

## 2023-12-23 DIAGNOSIS — I739 Peripheral vascular disease, unspecified: Secondary | ICD-10-CM | POA: Diagnosis not present

## 2023-12-23 DIAGNOSIS — G2401 Drug induced subacute dyskinesia: Secondary | ICD-10-CM | POA: Diagnosis not present

## 2023-12-23 DIAGNOSIS — Z6830 Body mass index (BMI) 30.0-30.9, adult: Secondary | ICD-10-CM | POA: Diagnosis not present

## 2023-12-23 DIAGNOSIS — M47817 Spondylosis without myelopathy or radiculopathy, lumbosacral region: Secondary | ICD-10-CM | POA: Diagnosis not present

## 2023-12-23 DIAGNOSIS — Z1331 Encounter for screening for depression: Secondary | ICD-10-CM | POA: Diagnosis not present

## 2023-12-23 DIAGNOSIS — Z0001 Encounter for general adult medical examination with abnormal findings: Secondary | ICD-10-CM | POA: Diagnosis not present

## 2023-12-23 DIAGNOSIS — I10 Essential (primary) hypertension: Secondary | ICD-10-CM | POA: Diagnosis not present

## 2023-12-24 DIAGNOSIS — Z0001 Encounter for general adult medical examination with abnormal findings: Secondary | ICD-10-CM | POA: Diagnosis not present

## 2024-05-21 DEATH — deceased
